# Patient Record
Sex: Male | Born: 1945 | Race: White | Hispanic: No | Marital: Married | Smoking: Former smoker
Health system: Southern US, Community
[De-identification: ages and names within clinical notes are randomized; demographics above are authoritative.]

## PROBLEM LIST (undated history)

## (undated) DIAGNOSIS — I10 Essential (primary) hypertension: Secondary | ICD-10-CM

## (undated) DIAGNOSIS — I4819 Other persistent atrial fibrillation: Secondary | ICD-10-CM

## (undated) DIAGNOSIS — Z85828 Personal history of other malignant neoplasm of skin: Secondary | ICD-10-CM

## (undated) DIAGNOSIS — Z5181 Encounter for therapeutic drug level monitoring: Secondary | ICD-10-CM

## (undated) DIAGNOSIS — I493 Ventricular premature depolarization: Secondary | ICD-10-CM

## (undated) DIAGNOSIS — N529 Male erectile dysfunction, unspecified: Secondary | ICD-10-CM

## (undated) DIAGNOSIS — M75100 Unspecified rotator cuff tear or rupture of unspecified shoulder, not specified as traumatic: Secondary | ICD-10-CM

## (undated) DIAGNOSIS — Z87898 Personal history of other specified conditions: Secondary | ICD-10-CM

## (undated) DIAGNOSIS — K219 Gastro-esophageal reflux disease without esophagitis: Secondary | ICD-10-CM

## (undated) DIAGNOSIS — Z79899 Other long term (current) drug therapy: Secondary | ICD-10-CM

## (undated) DIAGNOSIS — E785 Hyperlipidemia, unspecified: Secondary | ICD-10-CM

## (undated) DIAGNOSIS — K5790 Diverticulosis of intestine, part unspecified, without perforation or abscess without bleeding: Secondary | ICD-10-CM

## (undated) HISTORY — DX: Diverticulosis of intestine, part unspecified, without perforation or abscess without bleeding: K57.90

## (undated) HISTORY — PX: TONSILLECTOMY AND ADENOIDECTOMY: SUR1326

## (undated) HISTORY — DX: Personal history of other specified conditions: Z87.898

## (undated) HISTORY — PX: CYSTECTOMY: SUR359

## (undated) HISTORY — PX: INGUINAL HERNIA REPAIR: SUR1180

## (undated) HISTORY — DX: Hyperlipidemia, unspecified: E78.5

## (undated) HISTORY — DX: Essential (primary) hypertension: I10

## (undated) HISTORY — DX: Male erectile dysfunction, unspecified: N52.9

## (undated) HISTORY — DX: Gastro-esophageal reflux disease without esophagitis: K21.9

---

## 1992-11-21 DIAGNOSIS — Z87898 Personal history of other specified conditions: Secondary | ICD-10-CM

## 1992-11-21 HISTORY — DX: Personal history of other specified conditions: Z87.898

## 2004-06-29 ENCOUNTER — Ambulatory Visit (HOSPITAL_COMMUNITY): Admission: RE | Admit: 2004-06-29 | Discharge: 2004-06-29 | Payer: Self-pay | Admitting: Surgery

## 2004-11-21 DIAGNOSIS — K5790 Diverticulosis of intestine, part unspecified, without perforation or abscess without bleeding: Secondary | ICD-10-CM

## 2004-11-21 HISTORY — DX: Diverticulosis of intestine, part unspecified, without perforation or abscess without bleeding: K57.90

## 2004-11-21 HISTORY — PX: COLONOSCOPY: SHX174

## 2005-07-20 ENCOUNTER — Ambulatory Visit: Payer: Self-pay | Admitting: Internal Medicine

## 2005-07-20 ENCOUNTER — Encounter (INDEPENDENT_AMBULATORY_CARE_PROVIDER_SITE_OTHER): Payer: Self-pay | Admitting: *Deleted

## 2005-08-01 ENCOUNTER — Ambulatory Visit: Payer: Self-pay | Admitting: Gastroenterology

## 2005-08-15 ENCOUNTER — Ambulatory Visit: Payer: Self-pay | Admitting: Gastroenterology

## 2005-09-09 ENCOUNTER — Ambulatory Visit: Payer: Self-pay | Admitting: Internal Medicine

## 2005-10-03 ENCOUNTER — Ambulatory Visit: Payer: Self-pay | Admitting: Internal Medicine

## 2006-07-10 ENCOUNTER — Ambulatory Visit: Payer: Self-pay | Admitting: Internal Medicine

## 2006-07-11 ENCOUNTER — Encounter: Payer: Self-pay | Admitting: Internal Medicine

## 2006-08-04 ENCOUNTER — Ambulatory Visit: Payer: Self-pay | Admitting: Internal Medicine

## 2006-10-19 ENCOUNTER — Ambulatory Visit: Payer: Self-pay | Admitting: Internal Medicine

## 2006-10-27 ENCOUNTER — Ambulatory Visit: Payer: Self-pay | Admitting: Internal Medicine

## 2006-10-27 LAB — CONVERTED CEMR LAB
Chol/HDL Ratio, serum: 3.7
Cholesterol: 125 mg/dL (ref 0–200)

## 2007-07-05 ENCOUNTER — Telehealth (INDEPENDENT_AMBULATORY_CARE_PROVIDER_SITE_OTHER): Payer: Self-pay | Admitting: *Deleted

## 2007-07-06 ENCOUNTER — Encounter (INDEPENDENT_AMBULATORY_CARE_PROVIDER_SITE_OTHER): Payer: Self-pay | Admitting: *Deleted

## 2007-09-24 ENCOUNTER — Ambulatory Visit: Payer: Self-pay | Admitting: Internal Medicine

## 2007-09-27 DIAGNOSIS — R220 Localized swelling, mass and lump, head: Secondary | ICD-10-CM

## 2007-09-27 DIAGNOSIS — K219 Gastro-esophageal reflux disease without esophagitis: Secondary | ICD-10-CM

## 2007-10-02 ENCOUNTER — Encounter (INDEPENDENT_AMBULATORY_CARE_PROVIDER_SITE_OTHER): Payer: Self-pay | Admitting: *Deleted

## 2007-10-02 LAB — CONVERTED CEMR LAB
ALT: 25 units/L (ref 0–53)
BUN: 6 mg/dL (ref 6–23)
Creatinine, Ser: 1.1 mg/dL (ref 0.4–1.5)
Glucose, Bld: 95 mg/dL (ref 70–99)
HDL: 30.9 mg/dL — ABNORMAL LOW (ref >39.0)
LDL Cholesterol: 69 mg/dL (ref 0–99)
Total CHOL/HDL Ratio: 3.6
Triglycerides: 54 mg/dL (ref 0–149)

## 2007-10-04 ENCOUNTER — Encounter: Payer: Self-pay | Admitting: Internal Medicine

## 2007-10-04 ENCOUNTER — Ambulatory Visit: Payer: Self-pay | Admitting: Internal Medicine

## 2007-10-30 ENCOUNTER — Encounter (INDEPENDENT_AMBULATORY_CARE_PROVIDER_SITE_OTHER): Payer: Self-pay | Admitting: *Deleted

## 2007-10-31 ENCOUNTER — Ambulatory Visit: Payer: Self-pay | Admitting: Internal Medicine

## 2007-10-31 DIAGNOSIS — E782 Mixed hyperlipidemia: Secondary | ICD-10-CM

## 2007-10-31 DIAGNOSIS — N400 Enlarged prostate without lower urinary tract symptoms: Secondary | ICD-10-CM | POA: Insufficient documentation

## 2007-10-31 DIAGNOSIS — I10 Essential (primary) hypertension: Secondary | ICD-10-CM

## 2007-10-31 DIAGNOSIS — D692 Other nonthrombocytopenic purpura: Secondary | ICD-10-CM | POA: Insufficient documentation

## 2007-10-31 LAB — CONVERTED CEMR LAB: Cholesterol, target level: 200 mg/dL

## 2007-11-04 LAB — CONVERTED CEMR LAB
Basophils Relative: 0.3 % (ref 0.0–1.0)
Eosinophils Absolute: 0.1 10*3/uL (ref 0.0–0.6)
Eosinophils Relative: 1.6 % (ref 0.0–5.0)
Hemoglobin: 14.7 g/dL (ref 13.0–17.0)
Lymphocytes Relative: 26.7 % (ref 12.0–46.0)
MCHC: 34.9 g/dL (ref 30.0–36.0)
MCV: 84.7 fL (ref 78.0–100.0)
Neutro Abs: 5.4 10*3/uL (ref 1.4–7.7)
Neutrophils Relative %: 60.7 % (ref 43.0–77.0)
Platelets: 273 10*3/uL (ref 150–400)
RBC: 4.96 M/uL (ref 4.22–5.81)
TSH: 1.13 microintl units/mL (ref 0.35–5.50)
WBC: 8.9 10*3/uL (ref 4.5–10.5)

## 2007-11-05 ENCOUNTER — Encounter (INDEPENDENT_AMBULATORY_CARE_PROVIDER_SITE_OTHER): Payer: Self-pay | Admitting: *Deleted

## 2007-12-09 ENCOUNTER — Encounter: Payer: Self-pay | Admitting: Internal Medicine

## 2008-01-02 ENCOUNTER — Ambulatory Visit: Payer: Self-pay | Admitting: Internal Medicine

## 2008-01-02 DIAGNOSIS — IMO0002 Reserved for concepts with insufficient information to code with codable children: Secondary | ICD-10-CM | POA: Insufficient documentation

## 2008-01-09 ENCOUNTER — Encounter: Admission: RE | Admit: 2008-01-09 | Discharge: 2008-01-09 | Payer: Self-pay | Admitting: Geriatric Medicine

## 2008-01-14 ENCOUNTER — Telehealth: Payer: Self-pay | Admitting: Internal Medicine

## 2008-01-14 DIAGNOSIS — M4800 Spinal stenosis, site unspecified: Secondary | ICD-10-CM

## 2008-01-15 ENCOUNTER — Telehealth (INDEPENDENT_AMBULATORY_CARE_PROVIDER_SITE_OTHER): Payer: Self-pay | Admitting: *Deleted

## 2008-01-19 ENCOUNTER — Encounter: Payer: Self-pay | Admitting: Internal Medicine

## 2008-09-17 ENCOUNTER — Ambulatory Visit: Payer: Self-pay | Admitting: Internal Medicine

## 2008-10-06 ENCOUNTER — Telehealth (INDEPENDENT_AMBULATORY_CARE_PROVIDER_SITE_OTHER): Payer: Self-pay | Admitting: *Deleted

## 2008-12-11 ENCOUNTER — Ambulatory Visit: Payer: Self-pay | Admitting: Internal Medicine

## 2008-12-11 DIAGNOSIS — K5730 Diverticulosis of large intestine without perforation or abscess without bleeding: Secondary | ICD-10-CM | POA: Insufficient documentation

## 2008-12-11 DIAGNOSIS — R739 Hyperglycemia, unspecified: Secondary | ICD-10-CM

## 2008-12-11 DIAGNOSIS — I498 Other specified cardiac arrhythmias: Secondary | ICD-10-CM

## 2008-12-15 ENCOUNTER — Ambulatory Visit: Payer: Self-pay | Admitting: Internal Medicine

## 2008-12-15 ENCOUNTER — Encounter (INDEPENDENT_AMBULATORY_CARE_PROVIDER_SITE_OTHER): Payer: Self-pay | Admitting: *Deleted

## 2008-12-18 ENCOUNTER — Ambulatory Visit: Payer: Self-pay

## 2008-12-18 ENCOUNTER — Encounter: Payer: Self-pay | Admitting: Internal Medicine

## 2008-12-18 ENCOUNTER — Ambulatory Visit: Payer: Self-pay | Admitting: Internal Medicine

## 2009-01-08 ENCOUNTER — Ambulatory Visit: Payer: Self-pay | Admitting: Internal Medicine

## 2009-03-10 ENCOUNTER — Ambulatory Visit: Payer: Self-pay | Admitting: Internal Medicine

## 2009-03-17 ENCOUNTER — Encounter (INDEPENDENT_AMBULATORY_CARE_PROVIDER_SITE_OTHER): Payer: Self-pay | Admitting: *Deleted

## 2009-03-17 LAB — CONVERTED CEMR LAB
Cholesterol: 103 mg/dL (ref 0–200)
HDL: 35.8 mg/dL — ABNORMAL LOW (ref >39.00)
LDL Cholesterol: 54 mg/dL (ref 0–99)
VLDL: 13 mg/dL (ref 0.0–40.0)

## 2009-05-04 ENCOUNTER — Ambulatory Visit: Payer: Self-pay | Admitting: Internal Medicine

## 2009-09-30 ENCOUNTER — Ambulatory Visit: Payer: Self-pay | Admitting: Internal Medicine

## 2010-01-29 ENCOUNTER — Telehealth (INDEPENDENT_AMBULATORY_CARE_PROVIDER_SITE_OTHER): Payer: Self-pay | Admitting: *Deleted

## 2010-02-02 ENCOUNTER — Telehealth (INDEPENDENT_AMBULATORY_CARE_PROVIDER_SITE_OTHER): Payer: Self-pay | Admitting: *Deleted

## 2010-02-03 ENCOUNTER — Telehealth: Payer: Self-pay | Admitting: Internal Medicine

## 2010-02-04 ENCOUNTER — Encounter: Payer: Self-pay | Admitting: Internal Medicine

## 2010-03-24 ENCOUNTER — Ambulatory Visit: Payer: Self-pay | Admitting: Internal Medicine

## 2010-03-24 DIAGNOSIS — D485 Neoplasm of uncertain behavior of skin: Secondary | ICD-10-CM

## 2010-03-24 DIAGNOSIS — Z85828 Personal history of other malignant neoplasm of skin: Secondary | ICD-10-CM

## 2010-05-02 ENCOUNTER — Encounter: Payer: Self-pay | Admitting: Internal Medicine

## 2010-05-13 ENCOUNTER — Ambulatory Visit: Payer: Self-pay | Admitting: Internal Medicine

## 2010-05-13 DIAGNOSIS — I4949 Other premature depolarization: Secondary | ICD-10-CM

## 2010-05-14 ENCOUNTER — Encounter: Payer: Self-pay | Admitting: Internal Medicine

## 2010-06-09 ENCOUNTER — Ambulatory Visit (HOSPITAL_COMMUNITY): Admission: RE | Admit: 2010-06-09 | Discharge: 2010-06-09 | Payer: Self-pay | Admitting: Internal Medicine

## 2010-06-09 ENCOUNTER — Ambulatory Visit: Payer: Self-pay

## 2010-06-09 ENCOUNTER — Ambulatory Visit: Payer: Self-pay | Admitting: Cardiology

## 2010-06-09 ENCOUNTER — Encounter: Payer: Self-pay | Admitting: Internal Medicine

## 2010-06-21 ENCOUNTER — Telehealth: Payer: Self-pay | Admitting: Internal Medicine

## 2010-07-06 ENCOUNTER — Encounter: Payer: Self-pay | Admitting: Internal Medicine

## 2010-09-20 ENCOUNTER — Ambulatory Visit: Payer: Self-pay | Admitting: Internal Medicine

## 2010-11-21 HISTORY — PX: SPINE SURGERY: SHX786

## 2010-12-12 ENCOUNTER — Encounter: Payer: Self-pay | Admitting: Internal Medicine

## 2010-12-19 LAB — CONVERTED CEMR LAB
ALT: 23 units/L (ref 0–53)
AST: 25 units/L (ref 0–37)
AST: 31 units/L (ref 0–37)
Albumin: 3.6 g/dL (ref 3.5–5.2)
Albumin: 3.9 g/dL (ref 3.5–5.2)
Alkaline Phosphatase: 76 units/L (ref 39–117)
BUN: 6 mg/dL (ref 6–23)
BUN: 7 mg/dL (ref 6–23)
Basophils Absolute: 0 10*3/uL (ref 0.0–0.1)
Basophils Relative: 0.2 % (ref 0.0–3.0)
Basophils Relative: 0.4 % (ref 0.0–3.0)
Bilirubin, Direct: 0.1 mg/dL (ref 0.0–0.3)
Bilirubin, Direct: 0.1 mg/dL (ref 0.0–0.3)
CO2: 27 meq/L (ref 19–32)
Calcium: 8.8 mg/dL (ref 8.4–10.5)
Calcium: 8.9 mg/dL (ref 8.4–10.5)
Chloride: 103 meq/L (ref 96–112)
Chloride: 104 meq/L (ref 96–112)
Cholesterol, target level: 200 mg/dL
Cholesterol: 109 mg/dL (ref 0–200)
Cholesterol: 94 mg/dL (ref 0–200)
Creatinine, Ser: 1 mg/dL (ref 0.4–1.5)
Creatinine, Ser: 1.1 mg/dL (ref 0.4–1.5)
Eosinophils Absolute: 0.1 10*3/uL (ref 0.0–0.7)
Eosinophils Absolute: 0.2 10*3/uL (ref 0.0–0.7)
Eosinophils Relative: 0.9 % (ref 0.0–5.0)
GFR calc Af Amer: 97 mL/min
GFR calc non Af Amer: 80 mL/min
Glucose, Bld: 93 mg/dL (ref 70–99)
Glucose, Bld: 97 mg/dL (ref 70–99)
HCT: 41.5 % (ref 39.0–52.0)
HCT: 45.7 % (ref 39.0–52.0)
HDL goal, serum: 40 mg/dL
HDL: 31.9 mg/dL — ABNORMAL LOW (ref >39.0)
Hemoglobin: 14.1 g/dL (ref 13.0–17.0)
Hgb A1c MFr Bld: 6 % (ref 4.6–6.0)
LDL Cholesterol: 53 mg/dL (ref 0–99)
LDL Cholesterol: 69 mg/dL (ref 0–99)
LDL Goal: 100 mg/dL
Lymphocytes Relative: 22 % (ref 12.0–46.0)
MCHC: 34 g/dL (ref 30.0–36.0)
MCV: 85.4 fL (ref 78.0–100.0)
Monocytes Absolute: 0.7 10*3/uL (ref 0.1–1.0)
Monocytes Relative: 9.1 % (ref 3.0–12.0)
Neutro Abs: 5.6 10*3/uL (ref 1.4–7.7)
Neutro Abs: 5.9 10*3/uL (ref 1.4–7.7)
Neutrophils Relative %: 62.2 % (ref 43.0–77.0)
Neutrophils Relative %: 67.8 % (ref 43.0–77.0)
PSA: 0.56 ng/mL (ref 0.10–4.00)
PSA: 0.6 ng/mL (ref 0.10–4.00)
Platelets: 232 10*3/uL (ref 150–400)
Potassium: 4 meq/L (ref 3.5–5.1)
RBC: 4.86 M/uL (ref 4.22–5.81)
RDW: 13.6 % (ref 11.5–14.6)
RDW: 14.1 % (ref 11.5–14.6)
Sodium: 136 meq/L (ref 135–145)
Sodium: 139 meq/L (ref 135–145)
TSH: 0.91 microintl units/mL (ref 0.35–5.50)
Total Bilirubin: 0.6 mg/dL (ref 0.3–1.2)
Total Bilirubin: 1 mg/dL (ref 0.3–1.2)
Total CHOL/HDL Ratio: 2.9
Total Protein: 6.4 g/dL (ref 6.0–8.3)
Total Protein: 6.6 g/dL (ref 6.0–8.3)
Triglycerides: 45 mg/dL (ref 0–149)
Triglycerides: 58 mg/dL (ref 0.0–149.0)
VLDL: 9 mg/dL (ref 0–40)
WBC: 8.2 10*3/uL (ref 4.5–10.5)
WBC: 9.5 10*3/uL (ref 4.5–10.5)

## 2010-12-21 NOTE — Letter (Signed)
Summary: [facility]  [facility]   Imported By: Lanelle Bal 08/16/2010 09:20:24  _____________________________________________________________________  External Attachment:    Type:   Image     Comment:   External Document

## 2010-12-21 NOTE — Progress Notes (Signed)
Summary: refill  Phone Note Refill Request Message from:  Fax from Pharmacy on January 29, 2010 9:05 AM  Refills Requested: Medication #1:  QUINAPRIL HCL 40 MG  TABS 1 by mouth qd  Medication #2:  ADVICOR 1000-20 MG  TB24 (NIACIN-LOVASTATIN) 1/2 once daily [facility] fax [phone]   Method Requested: Mail to Pharmacy Next Appointment Scheduled: 03/24/2010 Initial call taken by: Barb Merino,  January 29, 2010 9:06 AM    Prescriptions: ADVICOR 1000-20 MG  TB24 (NIACIN-LOVASTATIN) 1/2 once daily  #45 x 0   Entered by:   Shonna Chock   Authorized by:   Marga Melnick MD   Signed by:   Shonna Chock on 01/29/2010   Method used:   Faxed to ...       [facility]* (mail-order)             ,          Ph: [phone]       Fax: [phone]   RxID:   [id number] QUINAPRIL HCL 40 MG  TABS (QUINAPRIL HCL) 1 by mouth qd  #90 x 0   Entered by:   Shonna Chock   Authorized by:   Marga Melnick MD   Signed by:   Shonna Chock on 01/29/2010   Method used:   Faxed to ...       [facility]* (mail-order)             ,          Ph: [phone]       Fax: [phone]   RxID:   [id number]

## 2010-12-21 NOTE — Progress Notes (Signed)
Summary: test results  Phone Note Call from Patient Call back at Home Phone [phone]   Caller: Spouse [phone]  Reason for Call: Talk to Nurse, Lab or Test Results Details for Reason: echo.have permission to leave message on voice mail. Initial call taken by: Lorne Skeens,  June 21, 2010 10:17 AM  Follow-up for Phone Call        lmom with results Dennis Bast, RN, BSN  June 21, 2010 1:14 PM

## 2010-12-21 NOTE — Miscellaneous (Signed)
Summary: Appointment Canceled  Appointment status changed to canceled by [employer] on 05/14/2010 11:45 AM.  Cancellation Comments --------------------- echo/401.09/bcbs prec. req/saf  Appointment Information ----------------------- Appt Type:  CARDIOLOGY ANCILLARY VISIT      Date:  Wednesday, June 02, 2010      Time:  11:30 AM for 60 min   Urgency:  Routine   Made By:  Pearson Grippe  To Visit:  [id number]    Reason:  echo/401.09/bcbs prec. req/saf  Appt Comments ------------- -- 05/14/10 11:45: ([facility]) CANCELED -- echo/401.09/bcbs prec. req/saf -- 05/13/10 11:12: ([facility]) BOOKED -- Routine CARDIOLOGY ANCILLARY VISIT at 06/02/2010 11:30 AM for 60 min echo/401.09/bcbs prec. req/saf

## 2010-12-21 NOTE — Consult Note (Signed)
Summary: [facility]   Imported By: Lanelle Bal 05/13/2010 12:26:55  _____________________________________________________________________  External Attachment:    Type:   Image     Comment:   External Document

## 2010-12-21 NOTE — Progress Notes (Signed)
Summary: med changed to ADVICOR 500-20 MG  Phone Note From Pharmacy Call back at [phone] opt 2   Caller: [facility] Summary of Call: reference #[id number] [facility] pharmacy call to verify rx sent on 01-29-10 for ADVICOR 1000-20 MG 1/2 once daily. Per pharmacy med can not be 1/2  according to manufacture due to med being extended release.  This med does come in ADVICOR 500-20 MG if physician does want to change strength. pls advise on med ...................Marland KitchenFelecia Deloach CMA  February 03, 2010 9:36 AM   Follow-up for Phone Call        per dr Donella Pascarella ok to change pt to ADVICOR 500-20 MG 1 tab once daily at bedtime, pharmacy notified, med list updated...............Marland KitchenFelecia Deloach CMA  February 03, 2010 10:12 AM     New/Updated Medications: ADVICOR 500-20 MG XR24H-TAB (NIACIN-LOVASTATIN) Take 1 tab once daily at bedtime Prescriptions: ADVICOR 500-20 MG XR24H-TAB (NIACIN-LOVASTATIN) Take 1 tab once daily at bedtime  #90 x 0   Entered by:   Jeremy Johann CMA   Authorized by:   Marga Melnick MD   Signed by:   Jeremy Johann CMA on 02/03/2010   Method used:   Telephoned to ...       [facility]* (mail-order)             ,          Ph: [phone]       Fax: [phone]   RxID:   [id number]

## 2010-12-21 NOTE — Assessment & Plan Note (Signed)
Summary: cpx/ns/kdc   Vital Signs:  Patient profile:   65 year old male Height:      70.75 inches Weight:      197.6 pounds BMI:     27.86 Temp:     98.3 degrees F oral Pulse rate:   64 / minute Resp:     14 per minute BP sitting:   100 / 68  (left arm) Cuff size:   large  Vitals Entered By: Shonna Chock (Mar 24, 2010 8:37 AM)  Comments REVIEWED MED LIST, PATIENT AGREED DOSE AND INSTRUCTION CORRECT    History of Present Illness: Sergio Mack is here for a physical ; he has  had an  active RTI for 2 weeks.  Allergies: 1)  ! * Shellfish  Past History:  Past Medical History: GERD Hyperlipidemia:LDL goal = < 100 based on NMR Lipoprofile Hypertension Hyperglycemia, PMH of  Diverticulosis, colon Skin cancer, hx of R ear   Past Surgical History: Inguinal herniorrhaphy X2 T & A Benign cyst chest removed from chest & lip Syncope 1994 negative  evaluation , ? heat related Colon---tics August 11, 2005  Family History: Father:died of   MI @ 85 Mother: DM,mini CVAs Siblings: sister DM,MI; MGF: stomach cancer  Social History: Retired Married Former Smoker: quit 06/06/? 1991 Alcohol use-no Regular exercise-no  Review of Systems General:  Denies chills, fatigue, fever, and sweats. Eyes:  Denies blurring, double vision, and vision loss-both eyes. ENT:  Complains of nasal congestion; denies ear discharge, earache, and sinus pressure; No frontal headache, facial pain or purulence. CV:  Denies chest pain or discomfort, difficulty breathing at night, difficulty breathing while lying down, leg cramps with exertion, palpitations, swelling of feet, and swelling of hands. Resp:  Complains of cough and sputum productive; denies chest pain with inspiration, coughing up blood, shortness of breath, and wheezing; White sputum. GI:  Denies abdominal pain, bloody stools, and dark tarry stools; Dietary related dyspepsia responsive to TUMS. GU:  Denies discharge, dysuria, and hematuria. MS:   Denies joint pain, joint redness, joint swelling, low back pain, mid back pain, and thoracic pain. Derm:  Complains of poor wound healing; denies changes in nail beds, dryness, hair loss, and lesion(s). Neuro:  Complains of numbness and tingling; Positional N&T @ night. Psych:  Denies anxiety and depression. Endo:  Denies cold intolerance, excessive hunger, excessive thirst, excessive urination, and heat intolerance. Heme:  Denies bleeding; Easy bruising with ASA. Allergy:  Complains of itching eyes and sneezing; Symptoms after mowing; no Rx.  Physical Exam  General:  in no acute distress; alert,appropriate and cooperative throughout examination Head:  Normocephalic and atraumatic without obvious abnormalities. Pattern alopecia Eyes:  No corneal or conjunctival inflammation noted. Perrla. Funduscopic exam benign, without hemorrhages, exudates or papilledema.  Ears:  External ear exam shows no significant lesions or deformities.  Otoscopic examination reveals clear canals, tympanic membranes are intact bilaterally without bulging, retraction, inflammation or discharge. Hearing aids  bilaterally. Nose:  External nasal examination shows no deformity or inflammation. Nasal mucosa are pink and moist without lesions or exudates. Mouth:  Oral mucosa and oropharynx without lesions or exudates.  Lower plate; upper partial Neck:  No deformities, masses, or tenderness noted. Lungs:  Normal respiratory effort, chest expands symmetrically. Lungs are clear to auscultation, no crackles or wheezes. Heart:  irregular rhythm; frequent prematures. Flow murmur.   Abdomen:  Bowel sounds positive,abdomen soft and non-tender without masses, organomegaly or hernias noted. Rectal:  No external abnormalities noted. Normal sphincter tone. No rectal  masses or tenderness. Genitalia:  Testes bilaterally descended without nodularity, tenderness or masses. No scrotal masses or lesions. No penis lesions or urethral discharge. L  varicocele.   Prostate:  Prostate gland firm and smooth, no enlargement, nodularity, tenderness, mass, asymmetry or induration. Msk:  No deformity or scoliosis noted of thoracic or lumbar spine.   Pulses:  R and L carotid,radial,dorsalis pedis and posterior tibial pulses are full and equal bilaterally Extremities:  No clubbing, cyanosis, edema, or deformity noted with normal full range of motion of all joints. Keratotic lesion R 5th toe  Neurologic:  alert & oriented X3 and DTRs symmetrical and normal.   Skin:  Intact without suspicious lesions or rashes(see toe) Cervical Nodes:  No lymphadenopathy noted Axillary Nodes:  No palpable lymphadenopathy Inguinal Nodes:  No significant adenopathy Psych:  memory intact for recent and remote, normally interactive, and good eye contact.     Impression & Recommendations:  Problem # 1:  ROUTINE GENERAL MEDICAL EXAM@HEALTH  CARE FACL (ICD-V70.0)  Orders: EKG w/ Interpretation (93000) Venipuncture (69485) TLB-Lipid Panel (80061-LIPID) TLB-BMP (Basic Metabolic Panel-BMET) (80048-METABOL) TLB-CBC Platelet - w/Differential (85025-CBCD) TLB-Hepatic/Liver Function Pnl (80076-HEPATIC) TLB-TSH (Thyroid Stimulating Hormone) (84443-TSH) TLB-PSA (Prostate Specific Antigen) (84153-PSA)  Problem # 2:  BRONCHITIS-ACUTE (ICD-466.0)  His updated medication list for this problem includes:    Doxycycline Hyclate 100 Mg Caps (Doxycycline hyclate) .Marland Kitchen... 1 two times a day  Problem # 3:  OTHER SPECIFIED CARDIAC DYSRHYTHMIAS (ICD-427.89)  PVCs  & ectopic atrial rhythm; S/P Cardiology evaluation 2010, Dr Allred His updated medication list for this problem includes:    Aspirin Ec 81 Mg Tbec (Aspirin)  Orders: EKG w/ Interpretation (93000) Venipuncture (46270) Cardiology Referral (Cardiology)  Problem # 4:  HYPERTENSION, ESSENTIAL NOS (ICD-401.9)  His updated medication list for this problem includes:    Quinapril Hcl 40 Mg Tabs (Quinapril hcl) .Marland Kitchen... 1 by  mouth qd  Orders: Venipuncture (35009)  Problem # 5:  HYPERLIPIDEMIA (ICD-272.2)  Orders: Venipuncture (38182) TLB-Lipid Panel (80061-LIPID)  Problem # 6:  NEOPLASM, SKIN, UNCERTAIN BEHAVIOR (ICD-238.2)  R 5th toe  Orders: Podiatry Referral (Podiatry)  Complete Medication List: 1)  Quinapril Hcl 40 Mg Tabs (Quinapril hcl) .Marland Kitchen.. 1 by mouth qd 2)  Advicor 1000-20 Mg Xr24h-tab (niacin-lovastatin)  .... Take 1/2  tab once daily at bedtime 3)  Cialis 20 Mg Tabs (Tadalafil) .... As needed (8 per month) 4)  Aspirin Ec 81 Mg Tbec (Aspirin) 5)  Fish Oil 1000 Mg Caps (Omega-3 fatty acids) .... 2 by mouth two times a day 6)  Lutein  .... Take as directed weekends only 7)  Occuvite  .... Take two a day week days only 8)  Centrum Tabs (Multiple vitamins-minerals) .... Take one a day 9)  Vitamin B-12 1000 Mcg Subl (Cyanocobalamin) .Marland Kitchen.. 1 by mouth once daily 10)  Vitamin D 2000 Unit Tabs (Cholecalciferol) .Marland Kitchen.. 1 by mouth once daily 11)  Iron 325 (65 Fe) Mg Tabs (Ferrous sulfate) .Marland Kitchen.. 1 by mouth once daily 12)  Doxycycline Hyclate 100 Mg Caps (Doxycycline hyclate) .Marland Kitchen.. 1 two times a day  Patient Instructions: 1)  Carry corrected record when traveling Prescriptions: ADVICOR 1000-20 MG XR24H-TAB (NIACIN-LOVASTATIN) Take 1/2  tab once daily at bedtime  #90 x 1   Entered and Authorized by:   Marga Melnick MD   Signed by:   Marga Melnick MD on 03/24/2010   Method used:   Print then Give to Patient   RxID:   [id number] DOXYCYCLINE HYCLATE 100 MG CAPS (DOXYCYCLINE HYCLATE)  1 two times a day  #20 x 0   Entered and Authorized by:   Marga Melnick MD   Signed by:   Marga Melnick MD on 03/24/2010   Method used:   Print then Give to Patient   RxID:   [id number] CIALIS 20 MG  TABS (TADALAFIL) as needed (8 per month)  #8 x 5   Entered and Authorized by:   Marga Melnick MD   Signed by:   Marga Melnick MD on 03/24/2010   Method used:   Print then Give to Patient   RxID:    [id number] QUINAPRIL HCL 40 MG  TABS (QUINAPRIL HCL) 1 by mouth qd  #90 x 3   Entered and Authorized by:   Marga Melnick MD   Signed by:   Marga Melnick MD on 03/24/2010   Method used:   Print then Give to Patient   RxID:   [id number]   Appended Document: cpx/ns/kdc  Laboratory Results   Urine Tests   Date/Time Reported: Mar 24, 2010 12:43 PM   Routine Urinalysis   Color: yellow Appearance: Clear Glucose: negative   (Normal Range: Negative) Bilirubin: negative   (Normal Range: Negative) Ketone: negative   (Normal Range: Negative) Spec. Gravity: <1.005   (Normal Range: 1.003-1.035) Blood: negative   (Normal Range: Negative) pH: 7.0   (Normal Range: 5.0-8.0) Protein: negative   (Normal Range: Negative) Urobilinogen: negative   (Normal Range: 0-1) Nitrite: negative   (Normal Range: Negative) Leukocyte Esterace: negative   (Normal Range: Negative)    Comments: Floydene Flock  Mar 24, 2010 12:43 PM

## 2010-12-21 NOTE — Progress Notes (Signed)
Summary: Refill Request  Phone Note Refill Request Message from:  Patient  Refills Requested: Medication #1:  QUINAPRIL HCL 40 MG  TABS 1 by mouth qd  Medication #2:  ADVICOR 1000-20 MG  TB24 (NIACIN-LOVASTATIN) 1/2 once daily  Method Requested: Fax to Local Pharmacy Initial call taken by: Shonna Chock,  February 02, 2010 10:29 AM    Prescriptions: ADVICOR 1000-20 MG  TB24 (NIACIN-LOVASTATIN) 1/2 once daily  #15 x 0   Entered by:   Shonna Chock   Authorized by:   Marga Melnick MD   Signed by:   Shonna Chock on 02/02/2010   Method used:   Faxed to ...       [facility] [address] [id number]* (retail)       [address] [place], [place]  [address]       Ph: [phone]       Fax: [phone]   RxID:   [id number] QUINAPRIL HCL 40 MG  TABS (QUINAPRIL HCL) 1 by mouth qd  #30 x 0   Entered by:   Shonna Chock   Authorized by:   Marga Melnick MD   Signed by:   Shonna Chock on 02/02/2010   Method used:   Electronically to        [id number]* (retail)       [address] [place], [place]  [address]       Ph: [phone]       Fax: [phone]   RxID:   [id number]

## 2010-12-21 NOTE — Assessment & Plan Note (Signed)
Summary: flu shot//lch  Nurse Visit   Allergies: 1)  ! * Shellfish  Orders Added: 1)  Admin 1st Vaccine [90471] 2)  Flu Vaccine 46yrs + [16109] Flu Vaccine Consent Questions     Do you have a history of severe allergic reactions to this vaccine? no    Any prior history of allergic reactions to egg and/or gelatin? no    Do you have a sensitivity to the preservative Thimersol? no    Do you have a past history of Guillan-Barre Syndrome? no    Do you currently have an acute febrile illness? no    Have you ever had a severe reaction to latex? no    Vaccine information given and explained to patient? yes    Are you currently pregnant? no    Lot Number:[id number]   Exp Date:05/21/2011   Site Given  Left Deltoid IM .lbflu

## 2010-12-21 NOTE — Medication Information (Signed)
Summary: Change in [facility]  Change in [facility]   Imported By: Lanelle Bal 02/23/2010 08:11:45  _____________________________________________________________________  External Attachment:    Type:   Image     Comment:   External Document

## 2010-12-21 NOTE — Assessment & Plan Note (Signed)
Summary: ROV/CARDIAC DYSRHYTHMIAS   Visit Type:  Follow-up Primary Provider:  Marga Melnick MD   History of Present Illness: The patient presents today for routine electrophysiology followup. He reports doing very well since last being seen in our clinic. The patient denies symptoms of palpitations, chest pain, shortness of breath, orthopnea, PND, lower extremity edema, dizziness, presyncope, syncope, or neurologic sequela. The patient is tolerating medications without difficulties and is otherwise without complaint today.   Current Medications (verified): 1)  Quinapril Hcl 40 Mg  Tabs (Quinapril Hcl) .Marland Kitchen.. 1 By Mouth Qd 2)  Advicor 1000-20 Mg Xr24h-Tab (Niacin-Lovastatin) .... Take 1/2  Tab Once Daily At Bedtime 3)  Cialis 20 Mg  Tabs (Tadalafil) .... As Needed (8 Per Month) 4)  Aspirin Ec 81 Mg  Tbec (Aspirin) .... Take One Tablet By Mouth Once Daily. 5)  Fish Oil 1000 Mg Caps (Omega-3 Fatty Acids) .... 2 By Mouth Two Times A Day 6)  Lutein .... Take As Directed Weekends Only 7)  Occuvite .... Take Two A Day Week Days Only 8)  Centrum   Tabs (Multiple Vitamins-Minerals) .... Take One A Day 9)  Vitamin B-12 1000 Mcg Subl (Cyanocobalamin) .Marland Kitchen.. 1 By Mouth Once Daily 10)  Vitamin D 2000 Unit Tabs (Cholecalciferol) .Marland Kitchen.. 1 By Mouth Once Daily 11)  Iron 325 (65 Fe) Mg Tabs (Ferrous Sulfate) .Marland Kitchen.. 1 By Mouth Once Daily 12)  Doxycycline Hyclate 100 Mg Caps (Doxycycline Hyclate) .Marland Kitchen.. 1 Two Times A Day  Allergies (verified): 1)  ! * Shellfish  Past History:  Past Medical History: GERD Hyperlipidemia:LDL goal = < 100 based on NMR Lipoprofile Hypertension Hyperglycemia, PMH of  Diverticulosis, colon Skin cancer, hx of R ear  PACs and PVCs  Past Surgical History: Reviewed history from 03/24/2010 and no changes required. Inguinal herniorrhaphy X2 T & A Benign cyst chest removed from chest & lip Syncope 1994 negative  evaluation , ? heat related Colon---tics 07/2005  Social  History: Reviewed history from 03/24/2010 and no changes required. Retired Married Former Smoker: quit 06/06/? 1991 Alcohol use-no Regular exercise-no  Review of Systems       All systems are reviewed and negative except as listed in the HPI.   Vital Signs:  Patient profile:   65 year old male Height:      70.75 inches Weight:      191 pounds BMI:     26.92 Pulse rate:   83 / minute BP sitting:   102 / 60  (left arm)  Vitals Entered By: Laurance Flatten CMA (May 13, 2010 10:25 AM)  Physical Exam  General:  Well developed, well nourished, in no acute distress. Head:  normocephalic and atraumatic Mouth:  Teeth, gums and palate normal. Oral mucosa normal. Neck:  Neck supple, no JVD. No masses, thyromegaly or abnormal cervical nodes. Lungs:  Clear bilaterally to auscultation and percussion. Heart:  RRR with ectopy, no m/r/g Abdomen:  Bowel sounds positive; abdomen soft and non-tender without masses, organomegaly, or hernias noted. No hepatosplenomegaly. Msk:  Back normal, normal gait. Muscle strength and tone normal. Pulses:  pulses normal in all 4 extremities Extremities:  No clubbing or cyanosis. Neurologic:  Alert and oriented x 3.   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  He does not have h/o ischemic symptoms or presyncope.  His prior echo and nuclear studies were normal. We will repeat his echo at this time. As he is asymptomatic, I would not recommend additional medical therapy at this time. He has occasional fatigue in the afternoons, I would therefore not start beta blocker therapy at this time.  Problem # 2:  HYPERTENSION, ESSENTIAL NOS (ICD-401.9) stable  Other Orders: Echocardiogram (Echo)  Patient Instructions: 1)  Your physician recommends that you schedule a  follow-up appointment in: 12 months with Dr Johney Frame 2)  Your physician has requested that you have an echocardiogram.  Echocardiography is a painless test that uses sound waves to create images of your heart. It provides your doctor with information about the size and shape of your heart and how well your heart's chambers and valves are working.  This procedure takes approximately one hour. There are no restrictions for this procedure.

## 2011-01-05 ENCOUNTER — Encounter: Payer: Self-pay | Admitting: Internal Medicine

## 2011-01-20 ENCOUNTER — Other Ambulatory Visit: Payer: Self-pay | Admitting: Orthopedic Surgery

## 2011-01-20 DIAGNOSIS — M79605 Pain in left leg: Secondary | ICD-10-CM

## 2011-01-20 DIAGNOSIS — M549 Dorsalgia, unspecified: Secondary | ICD-10-CM

## 2011-01-21 ENCOUNTER — Ambulatory Visit
Admission: RE | Admit: 2011-01-21 | Discharge: 2011-01-21 | Disposition: A | Discharge status: Home / Self Care | Payer: Medicare Other | Source: Ambulatory Visit | Attending: Orthopedic Surgery | Admitting: Orthopedic Surgery

## 2011-01-21 DIAGNOSIS — M549 Dorsalgia, unspecified: Secondary | ICD-10-CM

## 2011-01-21 DIAGNOSIS — M79605 Pain in left leg: Secondary | ICD-10-CM

## 2011-01-25 ENCOUNTER — Encounter: Payer: Self-pay | Admitting: Internal Medicine

## 2011-01-28 ENCOUNTER — Encounter: Payer: Self-pay | Admitting: Internal Medicine

## 2011-01-28 ENCOUNTER — Other Ambulatory Visit: Payer: Self-pay | Admitting: Internal Medicine

## 2011-01-28 ENCOUNTER — Ambulatory Visit (INDEPENDENT_AMBULATORY_CARE_PROVIDER_SITE_OTHER): Payer: Medicare Other | Admitting: Internal Medicine

## 2011-01-28 DIAGNOSIS — E782 Mixed hyperlipidemia: Secondary | ICD-10-CM

## 2011-01-28 DIAGNOSIS — K219 Gastro-esophageal reflux disease without esophagitis: Secondary | ICD-10-CM

## 2011-01-28 DIAGNOSIS — E785 Hyperlipidemia, unspecified: Secondary | ICD-10-CM

## 2011-01-28 DIAGNOSIS — R7309 Other abnormal glucose: Secondary | ICD-10-CM

## 2011-01-28 DIAGNOSIS — I498 Other specified cardiac arrhythmias: Secondary | ICD-10-CM

## 2011-01-28 DIAGNOSIS — I10 Essential (primary) hypertension: Secondary | ICD-10-CM

## 2011-01-28 DIAGNOSIS — M4800 Spinal stenosis, site unspecified: Secondary | ICD-10-CM

## 2011-01-28 DIAGNOSIS — Z01818 Encounter for other preprocedural examination: Secondary | ICD-10-CM

## 2011-01-28 LAB — TSH: TSH: 1.22 u[IU]/mL (ref 0.35–5.50)

## 2011-01-28 LAB — LIPID PANEL
Cholesterol: 123 mg/dL (ref 0–200)
HDL: 38.4 mg/dL — ABNORMAL LOW (ref >39.00)
LDL Cholesterol: 76 mg/dL (ref 0–99)
VLDL: 8.6 mg/dL (ref 0.0–40.0)

## 2011-01-28 LAB — HEPATIC FUNCTION PANEL
ALT: 26 U/L (ref 0–53)
AST: 24 U/L (ref 0–37)
Albumin: 3.9 g/dL (ref 3.5–5.2)
Alkaline Phosphatase: 68 U/L (ref 39–117)
Bilirubin, Direct: 0.1 mg/dL (ref 0.0–0.3)
Total Bilirubin: 0.4 mg/dL (ref 0.3–1.2)
Total Protein: 6.4 g/dL (ref 6.0–8.3)

## 2011-01-28 LAB — BASIC METABOLIC PANEL
CO2: 29 mEq/L (ref 19–32)
Chloride: 100 mEq/L (ref 96–112)
Creatinine, Ser: 1 mg/dL (ref 0.4–1.5)
Sodium: 136 mEq/L (ref 135–145)

## 2011-01-28 LAB — CBC WITH DIFFERENTIAL/PLATELET
Basophils Absolute: 0 10*3/uL (ref 0.0–0.1)
Basophils Relative: 0.4 % (ref 0.0–3.0)
Eosinophils Relative: 2 % (ref 0.0–5.0)
Lymphocytes Relative: 22.6 % (ref 12.0–46.0)
Lymphs Abs: 2.3 10*3/uL (ref 0.7–4.0)
Neutro Abs: 6.8 10*3/uL (ref 1.4–7.7)
Platelets: 256 10*3/uL (ref 150.0–400.0)
RBC: 5.08 Mil/uL (ref 4.22–5.81)
RDW: 13.7 % (ref 11.5–14.6)
WBC: 10.1 10*3/uL (ref 4.5–10.5)

## 2011-02-01 ENCOUNTER — Other Ambulatory Visit: Payer: Self-pay | Admitting: Orthopedic Surgery

## 2011-02-01 ENCOUNTER — Other Ambulatory Visit (HOSPITAL_COMMUNITY): Payer: Self-pay | Admitting: Orthopedic Surgery

## 2011-02-01 ENCOUNTER — Ambulatory Visit (HOSPITAL_COMMUNITY)
Admission: RE | Admit: 2011-02-01 | Discharge: 2011-02-01 | Disposition: A | Discharge status: Home / Self Care | Payer: Medicare Other | Source: Ambulatory Visit | Attending: Orthopedic Surgery | Admitting: Orthopedic Surgery

## 2011-02-01 ENCOUNTER — Encounter (HOSPITAL_COMMUNITY): Payer: Medicare Other

## 2011-02-01 DIAGNOSIS — Z01818 Encounter for other preprocedural examination: Secondary | ICD-10-CM

## 2011-02-01 LAB — ABO/RH: ABO/RH(D): O POS

## 2011-02-01 LAB — URINALYSIS, ROUTINE W REFLEX MICROSCOPIC
Glucose, UA: NEGATIVE mg/dL
Hgb urine dipstick: NEGATIVE
Ketones, ur: NEGATIVE mg/dL
Urobilinogen, UA: 0.2 mg/dL (ref 0.0–1.0)

## 2011-02-01 LAB — SURGICAL PCR SCREEN
MRSA, PCR: NEGATIVE
Staphylococcus aureus: NEGATIVE

## 2011-02-01 LAB — APTT: aPTT: 26 seconds (ref 24–37)

## 2011-02-01 LAB — TYPE AND SCREEN: ABO/RH(D): O POS

## 2011-02-01 NOTE — Assessment & Plan Note (Signed)
Summary: surgical clearace for back/cdj   Vital Signs:  Patient profile:   65 year old male Height:      71.5 inches Weight:      199.6 pounds BMI:     27.55 Temp:     98.2 degrees F oral Pulse rate:   64 / minute Resp:     14 per minute BP sitting:   118 / 76  (left arm) Cuff size:   large  Vitals Entered By: Shonna Chock CMA (January 28, 2011 8:05 AM) CC: Surgical clearance, Back pain, Pre-op Evaluation, Lipid Management   Primary Care Provider:  Marga Melnick MD  CC:  Surgical clearance, Back pain, Pre-op Evaluation, and Lipid Management.  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 He must use cane to ambulate. pain meds & oral steroids have been partially effective. He  reports weakness in L foot  and loss of sensation in LLE , but denies fever, chills, fecal incontinence, urinary incontinence, urinary retention, and dysuria.  The pain is located in the mid low back.  The pain is made worse by standing or walking and extension.  Risk factors for serious underlying conditions include duration of pain > 1 month and age >= 50 years.      Pre-Op Evaluation: The patient denies respiratory symptoms, GI bleeding, chest pain, edema, PND, heavy ETOH use, and smoking.  Patient has no history of acute or recent MI, unstable or severe angina, symptomatic ventricular arrhythmia, and severe valvular disease.  There is no history of antiplatelet agents, chronic steroids, warfarin, diabetes meds, antianginal meds, and bleeding disorder.    Lipid Management History:      Positive NCEP/ATP III risk factors include male age 21 years old or older, HDL cholesterol less than 40, family history for ischemic heart disease (females less than 18 years old), and hypertension.  Negative NCEP/ATP III risk factors include non-diabetic, non-tobacco-user status, no ASHD  (atherosclerotic heart disease), no prior stroke/TIA, no peripheral vascular disease, and no history of aortic aneurysm.     Current Medications (verified): 1)  Quinapril Hcl 40 Mg  Tabs (Quinapril Hcl) .Marland Kitchen.. 1 By Mouth Qd 2)  Cialis 20 Mg  Tabs (Tadalafil) .... As Needed (8 Per Month) 3)  Aspirin Ec 81 Mg  Tbec (Aspirin) .... Take One Tablet By Mouth Once Daily. 4)  Fish Oil 1000 Mg Caps (Omega-3 Fatty Acids) .... 2 By Mouth Two Times A Day 5)  Lutein .... Take As Directed Weekends Only 6)  Occuvite .... Take Two A Day Week Days Only 7)  Centrum   Tabs (Multiple Vitamins-Minerals) .... Take One A Day 8)  Vitamin B-12 1000 Mcg Subl (Cyanocobalamin) .Marland Kitchen.. 1 By Mouth Once Daily 9)  Vitamin D 2000 Unit Tabs (Cholecalciferol) .Marland Kitchen.. 1 By Mouth Once Daily 10)  Prednisone 5 Mg Tabs (Prednisone) .... As Directed 11)  Hydrocodone-Acetaminophen 5-325 Mg Tabs (Hydrocodone-Acetaminophen) .Marland Kitchen.. 1 By Mouth 2-3 X Daily As Needed  Allergies: 1)  ! * Shellfish  Past History:  Past Medical History: GERD Hyperlipidemia:LDL goal = < 100 based on NMR Lipoprofile. Framingham Study LDL goal = < 130. Hypertension Hyperglycemia, PMH of  Diverticulosis, colon Skin cancer, PMH  of R ear  PACs and PVCs  Past Surgical History: Inguinal herniorrhaphy X2 T & A Benign cyst chest removed from chest &  lip Syncope 1994 negative  evaluation , ? heat related Colonoscopy: Tics  31-Aug-2005  Family History: Father:died of   MI @ 43 Mother: DM, mini CVAs Siblings: sister: DM,MI @ 68; MGF: stomach cancer  Social History: Retired Married Former Smoker: quit  1991 Alcohol use-no Regular exercise-no  Review of Systems General:  Denies fatigue and weight loss. CV:  Denies palpitations. Resp:  Denies cough, coughing up blood, and sputum productive. GI:  Denies bloody stools, dark tarry stools, and indigestion. GU:  Denies discharge and hematuria. Derm:  Denies lesion(s). Heme:  Complains of abnormal bruising;  denies bleeding and enlarge lymph nodes.  Physical Exam  General:  well-nourished,in no acute distress; alert,appropriate and cooperative throughout examination Neck:  No deformities, masses, or tenderness noted. Lungs:  Normal respiratory effort, chest expands symmetrically. Lungs are clear to auscultation, no crackles or wheezes. Heart:  normal rate, regular rhythm, no gallop, no rub, no JVD, no HJR, and grade 1 /6 systolic murmur.  Occasional premature ( see EKG) Abdomen:  Bowel sounds positive,abdomen soft and non-tender without masses, organomegaly. Small umbilical hernia  noted. Msk:  No deformity or scoliosis noted of thoracic or lumbar spine.   Pulses:  R and L carotid,radial,dorsalis pedis and posterior tibial pulses are full and equal bilaterally Extremities:  No clubbing, cyanosis, edema, or deformity noted with normal full range of motion of all joints.   Neurologic:  alert & oriented X3, strength normal in all extremities, gait abnormal ( foot drop on L), and DTRs symmetrical and normal.   Skin:  Intact without suspicious lesions or rashes Psych:  memory intact for recent and remote, normally interactive, and good eye contact.     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 Take one tablet by mouth once daily.  Orders: EKG w/ Interpretation (93000) TLB-TSH (Thyroid Stimulating Hormone) (84443-TSH)  Problem # 4:  HYPERTENSION, ESSENTIAL NOS (ICD-401.9)  His updated medication list for this problem includes:    Quinapril Hcl 40 Mg Tabs (Quinapril hcl) .Marland Kitchen... 1 by mouth qd  Orders: EKG w/ Interpretation (93000) Venipuncture (09811) TLB-BMP (Basic Metabolic Panel-BMET) (80048-METABOL)  Problem # 5:   HYPERLIPIDEMIA (ICD-272.2)  off Advicor 1 month  Orders: Venipuncture (91478) TLB-Lipid Panel (80061-LIPID) TLB-Hepatic/Liver Function Pnl (80076-HEPATIC) TLB-TSH (Thyroid Stimulating Hormone) (84443-TSH)  Problem # 6:  GERD (ICD-530.81)  Orders: TLB-CBC Platelet - w/Differential (85025-CBCD)  Problem # 7:  FASTING HYPERGLYCEMIA (ICD-790.29)  Orders: TLB-A1C / Hgb A1C (Glycohemoglobin) (83036-A1C)  Complete Medication List: 1)  Quinapril Hcl 40 Mg Tabs (Quinapril hcl) .Marland Kitchen.. 1 by mouth qd 2)  Cialis 20 Mg Tabs (Tadalafil) .... As needed (8 per month) 3)  Aspirin Ec 81 Mg Tbec (Aspirin) .... Take one tablet by mouth once daily. 4)  Fish Oil 1000 Mg Caps (Omega-3 fatty acids) .... 2 by mouth two times a day 5)  Lutein  .... Take as directed weekends only 6)  Occuvite  .... Take two a day week days only 7)  Centrum Tabs (Multiple vitamins-minerals) .... Take one a day 8)  Vitamin B-12 1000 Mcg Subl (Cyanocobalamin) .Marland Kitchen.. 1 by mouth once daily 9)  Vitamin D 2000 Unit Tabs (Cholecalciferol) .Marland Kitchen.. 1 by mouth once daily 10)  Prednisone 5 Mg Tabs (Prednisone) .... As  directed 11)  Hydrocodone-acetaminophen 5-325 Mg Tabs (Hydrocodone-acetaminophen) .Marland Kitchen.. 1 by mouth 2-3 x daily as needed  Lipid Assessment/Plan:      Based on NCEP/ATP III, the patient's risk factor category is "2 or more risk factors and a calculated 10 year CAD risk of < 20%".  The patient's lipid goals are as follows: Total cholesterol goal is 200; LDL cholesterol goal is 100; HDL cholesterol goal is 40; Triglyceride goal is 150.  His LDL cholesterol goal has been met.    Patient Instructions: 1)  Avoid stimulants as discussed. Clearance depends on pending labs.   Orders Added: 1)  Est. Patient Level IV [16109] 2)  EKG w/ Interpretation [93000] 3)  Venipuncture [36415] 4)  TLB-Lipid Panel [80061-LIPID] 5)  TLB-BMP (Basic Metabolic Panel-BMET) [80048-METABOL] 6)  TLB-CBC Platelet - w/Differential [85025-CBCD] 7)   TLB-Hepatic/Liver Function Pnl [80076-HEPATIC] 8)  TLB-TSH (Thyroid Stimulating Hormone) [84443-TSH] 9)  TLB-A1C / Hgb A1C (Glycohemoglobin) [83036-A1C]

## 2011-02-01 NOTE — Letter (Signed)
Summary: [facility]   Imported By: Maryln Gottron 01/25/2011 15:20:50  _____________________________________________________________________  External Attachment:    Type:   Image     Comment:   External Document

## 2011-02-02 ENCOUNTER — Inpatient Hospital Stay (HOSPITAL_COMMUNITY): Payer: Medicare Other

## 2011-02-02 ENCOUNTER — Inpatient Hospital Stay (HOSPITAL_COMMUNITY)
Admission: RE | Admit: 2011-02-02 | Discharge: 2011-02-04 | DRG: 491 | Disposition: A | Discharge status: Home / Self Care | Payer: Medicare Other | Source: Ambulatory Visit | Attending: Orthopedic Surgery | Admitting: Orthopedic Surgery

## 2011-02-02 ENCOUNTER — Other Ambulatory Visit (HOSPITAL_COMMUNITY): Payer: Self-pay | Admitting: Orthopedic Surgery

## 2011-02-02 DIAGNOSIS — Z9889 Other specified postprocedural states: Secondary | ICD-10-CM

## 2011-02-02 DIAGNOSIS — Z79899 Other long term (current) drug therapy: Secondary | ICD-10-CM

## 2011-02-02 DIAGNOSIS — Z7982 Long term (current) use of aspirin: Secondary | ICD-10-CM

## 2011-02-02 DIAGNOSIS — M48061 Spinal stenosis, lumbar region without neurogenic claudication: Secondary | ICD-10-CM | POA: Diagnosis present

## 2011-02-02 DIAGNOSIS — M5126 Other intervertebral disc displacement, lumbar region: Principal | ICD-10-CM | POA: Diagnosis present

## 2011-02-02 DIAGNOSIS — I10 Essential (primary) hypertension: Secondary | ICD-10-CM | POA: Diagnosis present

## 2011-02-02 DIAGNOSIS — Z01818 Encounter for other preprocedural examination: Secondary | ICD-10-CM

## 2011-02-03 ENCOUNTER — Other Ambulatory Visit: Payer: Self-pay | Admitting: Orthopedic Surgery

## 2011-02-03 LAB — HEMOGLOBIN AND HEMATOCRIT, BLOOD
HCT: 42.2 % (ref 39.0–52.0)
Hemoglobin: 13.6 g/dL (ref 13.0–17.0)

## 2011-02-03 NOTE — H&P (Addendum)
[name], [name]               ACCOUNT NO.:  [id number]  MEDICAL RECORD NO.:  [id number]           PATIENT TYPE:  I  LOCATION:  0010                         FACILITY:  [facility]  PHYSICIAN:  Georges Lynch. Tashema Tiller, M.D.DATE OF BIRTH:  [birth year]  DATE OF ADMISSION:  02/02/2011 DATE OF DISCHARGE:                             HISTORY & PHYSICAL   CHIEF COMPLAINT:  Low back pain with radiculopathy down the right leg.  BRIEF HISTORY:  Sergio Mack came to see me in February with complaint of increasing pain in his low back that was radiating down the right leg. The patient states he had a similar pain in the past back in 2009 and that was radiating down his left leg.  He was treated at that time with a Dosepak and this improved his pain greatly.  After physical exam and discussion, we decided to be conservative and try that again.  We did, the prednisone pack did not give him relief unfortunately, and he needed to have myelogram.  The test was conducted.  It was found that the patient had multilevel spinal stenosis with nerve root encroachment at L2-L3, L3-L4, L4-L5, the most severe level was noted to be L4-L5 on the left where there was a subtotal block.  The patient now presents for lumbar surgery to be performed by Dr. Darrelyn Hillock.  CURRENT MEDICATIONS: 1. Cialis. 2. Quinapril 3. Advicor. 4. Fish oil. 5. Aspirin. 6. Vitamin D. 7. Vitamin B12. 8. Lutein. 9. Iron. 10.Multivitamin.  DRUG ALLERGIES:  He is allergic to SHELLFISH.  PAST SURGICAL HISTORY:  History of hernia repair and tonsillectomy.  PAST MEDICAL HISTORY:  Positive for hypertension, spinal stenosis.  FAMILY HISTORY:  Positive for heart disease and diabetes.  SOCIAL HISTORY:  The patient denies use of alcohol or tobacco products. Primary care physician is Dr. Marga Melnick.  The patient has been cleared for surgery by Dr. Alwyn Ren although recommendation has been given that the patient be placed on telemetry  postoperatively due to PACs noted on his EKG.  REVIEW OF SYSTEMS:  GENERAL:  Negative for fevers, chills or weight change.  HEENT:  Negative for blurred vision or dizziness. DERMATOLOGIC:  Negative for rash or lesion.  CARDIOVASCULAR:  Negative for chest pain.  He does again have history of PACs.  RESPIRATORY: Negative for shortness of breath.  GI: Negative for reflux disease.  GU: Negative for hematuria, dysuria.  MUSCULOSKELETAL:  Positive for back pain.  PHYSICAL EXAMINATION:  VITAL SIGNS:  Pulse irregular, respirations 18, blood pressure 124/82 in the left arm. GENERAL:  [name] is alert and oriented x3, well developed, well nourished, no apparent distress. HEENT:  Normocephalic, atraumatic.  Extraocular movements intact. NECK:  Supple.  Full range of motion without lymphadenopathy. CHEST:  Lungs are clear to auscultation bilaterally without wheezes. HEART:  PVCs are noted.  This is without murmur. ABDOMEN:  Bowel sounds present in all 4 quadrants. EXTREMITIES:  There is weakness of the dorsi and plantar flexors on theleft foot. SKIN:  Unremarkable. NEUROLOGIC:  He has positive straight leg raise on the left.  IMAGING:  Myelogram revealed severe spinal stenosis as noted  in the history.  IMPRESSION:  Severe spinal stenosis.  PLAN:  HNP, L4-L5 on the left, to be performed by Dr. Darrelyn Hillock.     Rozell Searing, PAC   ______________________________ Georges Lynch Darrelyn Hillock, M.D.    LD/MEDQ  D:  02/02/2011  T:  02/02/2011  Job:  [id number]  cc:   [name]. Alwyn Ren, MD,FACP,FCCP [phone] [place] [address]  Electronically Signed by Rozell Searing  on 02/03/2011 08:51:27 AM Electronically Signed by Ranee Gosselin M.D. on 02/04/2011 07:20:33 AM

## 2011-02-04 NOTE — Op Note (Signed)
Sergio Mack, Sergio Mack               ACCOUNT NO.:  [id number]  MEDICAL RECORD NO.:  [id number]           PATIENT TYPE:  I  LOCATION:  1534                         FACILITY:  [facility]  PHYSICIAN:  Sergio Mack. Sergio Mack, M.D.DATE OF BIRTH:  1946/03/12  DATE OF PROCEDURE: DATE OF DISCHARGE:                              OPERATIVE REPORT   PREOPERATIVE DIAGNOSES: 1. Severe spinal stenosis at L4-5. 2. Herniated lumbar disk at L4-5 on the left.  POSTOPERATIVE DIAGNOSES: 1. Severe spinal stenosis at L4-5. 2. Herniated lumbar disk at L4-5 on the left.  SURGEON:  Sergio Mack. Sergio Mack, M.D.  ASSISTANT:  Sergio Mack, M.D.  OPERATION: 1. Complete decompressive lumbar laminectomy at L4-5 for spinal     stenosis. 2. Microdiskectomy at L4-5 on the left. 3. Foraminotomy, L4-5, on the left.  DESCRIPTION OF PROCEDURE:  Under general anesthesia, routine orthopedic prep and draping of the lower back was carried out with the patient on the spinal frame.  Prior to surgery, I marked his back in the holding area on the left side even though we were on central but all his symptoms were on the left and highlighted this.  He had a partial foot drop on the left.  The appropriate time-out was carried out in the operating room prior to making incision.  After sterile prepping and draping was carried out, 2 needles were placed in the back for localization purposes.  An x-ray was taken to verify our position.  We then made an incision over the L4-5 space.  Bleeders identified and cauterized.  The muscle was stripped from the lamina and spinous processes.  At this time, we took another x-ray with clamps on the spinous processes, we localized the L4 spinous process.  At this time, I removed the spinous process with a double-action rongeur.  We then went down and did a complete lumbar laminectomy at L4-5, the microscope was brought in.  We protected the dura at all times with cottonoids.  Note, he had rather  significant stenosis.  Another x-ray was taken with the [facility], instrument in place.  Following that we then protected the dura and removed the ligamentum flavum at L4-5 and then did a decompression and wide on the lateral recess.  We then out wide on the left.  We had a nice decompression.  We identified lateral recess veins, cauterized lateral recess veins.  The dura was gently retracted.  We indented the plantar side of the L5 root on the left.  The needle was placed in the disk space and an x-ray was taken to verify our position. Following that we made a cruciate incision in the posterior longitudinal ligament.  We then utilized the nerve hook and the Epstein curettes to go proximally, distally, medially and laterally to make sure there were no other loose fragments noted.  Then went down in the disk space to complete diskectomy.  We then sent the specimen to the lab.  Note, when we felt that we completed diskectomy, we did a nice foraminotomy for the 5 root.  We were able to easily pass a hockey-stick out the 5 root foramen.  We went proximally as well.  We went central, proximally and distally to make sure we had a good decompression and we did.  We decompressed until we felt we had a wide opening area distally and proximally.  We had thoroughly irrigated out the area.  We loosely applied some thrombin-soaked Gelfoam into the lateral recess on the left.  We then closed the wound layers in usual fashion.  I left a small distal, small proximal deep parts of the wound open for draining purposes.  Subcu was closed with 0 Vicryl, skin with metal staples and a sterile Neosporin dressing was applied.  He had 1 g of IV Ancef preop. The patient left the operating room in satisfactory condition.          ______________________________ Sergio Mack Sergio Mack, M.D.     RAG/MEDQ  D:  02/02/2011  T:  02/03/2011  Job:  [id number]  cc:   Sergio Mack. Sergio Ren, MD,FACP,FCCP [phone] [place] [address]  Electronically Signed by Sergio Mack M.D. on 02/04/2011 07:20:35 AM

## 2011-02-08 NOTE — Letter (Signed)
Summary: [facility]   Imported By: Kassie Mends 02/04/2011 08:02:45  _____________________________________________________________________  External Attachment:    Type:   Image     Comment:   External Document

## 2011-02-28 NOTE — Discharge Summary (Signed)
Sergio Mack, Sergio Mack               ACCOUNT NO.:  [id number]  MEDICAL RECORD NO.:  [id number]           PATIENT TYPE:  I  LOCATION:  1534                         FACILITY:  [facility]  PHYSICIAN:  Rozell Searing, [facility]    DATE OF BIRTH:  Dec 11, 1945  DATE OF ADMISSION:  02/02/2011 DATE OF DISCHARGE:  02/04/2011                              DISCHARGE SUMMARY   ADMITTING DIAGNOSES: 1. Spinal stenosis and lumbar disk herniation. 2. Hypertension.  DISCHARGE DIAGNOSES: 1. Spinal stenosis and lumbar disk herniation status post complete     decompressive lumbar laminectomy at L4-L5 for spinal stenosis as     well as microdiskectomy and foraminotomy at L4-L5 on the left. 2. Hypertension.  LABORATORY DATA:  Hemoglobin and hematocrit were checked on February 03, 2011.  The patient's hemoglobin was 13.6 and hematocrit was 42.2.  PROCEDURE:  On February 02, 2011, the patient was taken to the operating room by surgeon Dr. Ranee Gosselin, assistant Dr. Marlowe Kays.  The patient underwent a complete decompressive lumbar laminectomy at L4-L5 for spinal stenosis and microdiskectomy and foraminotomy at L4-L5 on the left.  The procedure was performed under general anesthesia.  Routine orthopedic prepping and draping were carried out.  The patient received antibiotics preoperatively.  There were no complications of the procedure.  He was returned to the recovery room in satisfactory condition.  HOSPITAL COURSE:  The patient was admitted to [facility] on February 02, 2011, and underwent the above-stated procedure without complication.  After adequate time in the recovery room, he was taken to the floor for further recovery.  He was placed on reduced dose PCA Dilaudid as well as muscle relaxant for pain control.  Postoperative day #1 the patient was resting comfortably.  His left leg pain had improved greatly.  He had very slight weakness in the left foot dorsiflexor. Sensation grossly intact in the  lower extremities.  The patient was visited by Physical Therapy on postoperative day #1 and then again on postoperative day #2.  He was able to ambulate with mild pain.  He was able to work on transferring.  Postoperative day #2 the patient's dressing was changed.  The dressing was dry.  Wound well-approximated with no signs of infection and no drainage.  PLAN:  Discharge to home.  DISPOSITION:  To home on February 04, 2011.  MEDICATIONS ON DISCHARGE: 1. Fish oil. 2. Cialis. 3. Vitamin B12. 4. Vitamin D3. 5. Quinapril. 6. Multivitamin. 7. Ocuvite. 8. Lutein. 9. Hydrocodone/acetaminophen. 10.Aspirin. 11.Percocet. 12.Robaxin.  ACTIVITY:  The patient is to increase his activity slowly.  He is able to shower but be mindful not to submerge the wound in water.  DIET:  No restrictions.  WOUND CARE:  Daily dressing change.  FOLLOWUP:  The patient will follow up with Dr. Darrelyn Hillock in the office 2 weeks from the day of surgery.  He should contact the office at [phone] to schedule this appointment.  CONDITION ON DISCHARGE:  Improving.     Rozell Searing, [facility]     LD/MEDQ  D:  02/23/2011  T:  02/24/2011  Job:  [id number]  Electronically Signed by Lillia Abed  Annalucia Laino  on 02/28/2011 08:44:09 AM

## 2011-03-26 ENCOUNTER — Encounter: Payer: Self-pay | Admitting: Internal Medicine

## 2011-03-28 ENCOUNTER — Encounter: Payer: Self-pay | Admitting: Internal Medicine

## 2011-03-28 ENCOUNTER — Ambulatory Visit (INDEPENDENT_AMBULATORY_CARE_PROVIDER_SITE_OTHER): Payer: Medicare Other | Admitting: Internal Medicine

## 2011-03-28 VITALS — BP 118/70 | HR 76 | Temp 98.5°F | Resp 14 | Ht 71.0 in | Wt 200.0 lb

## 2011-03-28 DIAGNOSIS — K219 Gastro-esophageal reflux disease without esophagitis: Secondary | ICD-10-CM

## 2011-03-28 DIAGNOSIS — E782 Mixed hyperlipidemia: Secondary | ICD-10-CM

## 2011-03-28 DIAGNOSIS — Z0000 Encounter for general adult medical examination without abnormal findings: Secondary | ICD-10-CM

## 2011-03-28 DIAGNOSIS — Z85828 Personal history of other malignant neoplasm of skin: Secondary | ICD-10-CM

## 2011-03-28 DIAGNOSIS — N529 Male erectile dysfunction, unspecified: Secondary | ICD-10-CM

## 2011-03-28 DIAGNOSIS — I10 Essential (primary) hypertension: Secondary | ICD-10-CM

## 2011-03-28 DIAGNOSIS — Z23 Encounter for immunization: Secondary | ICD-10-CM

## 2011-03-28 MED ORDER — PNEUMOCOCCAL VAC POLYVALENT 25 MCG/0.5ML IJ INJ
0.5000 mL | INJECTION | Freq: Once | INTRAMUSCULAR | Status: AC
  Administered 2011-03-28: 0.5 mL via INTRAMUSCULAR

## 2011-03-28 MED ORDER — QUINAPRIL HCL 40 MG PO TABS: 40.0000 mg | ORAL_TABLET | Freq: Every day | ORAL | Status: DC

## 2011-03-28 NOTE — Patient Instructions (Addendum)
Preventive Health Care: Exercise at least 30-45 minutes a day,  3-4 days a week.  Eat a low-fat diet with lots of fruits and vegetables, up to 7-9 servings per day. Avoid obesity; your goal is waist measurement < 40 inches.Consume less than 40 grams of sugar per day from foods & drinks with High Fructose Corn Sugar as #2,3 or # 4 on label. Health Care Power of Attorney & Living Will. Complete if not in place ; these place you in charge of your health care decisions. Urology referral will be scheduled . Assess response to samples of Viagra 50 mg 1-2  Daily as needed.

## 2011-03-28 NOTE — Assessment & Plan Note (Addendum)
NMR Lipoprofile  2007: LDL 129(1931/1411), HDL 30, TG 108.LDL goal = < 100, ideally < 70.

## 2011-03-28 NOTE — Assessment & Plan Note (Signed)
Cancer removed from R ear in 1980s

## 2011-03-28 NOTE — Progress Notes (Signed)
Subjective:    Patient ID: Sergio Mack, male    DOB: 09/29/1946, 65 y.o.   MRN: [id number]  HPI Medicare Wellness Visit:  The following psychosocial & medical history were reviewed as required by Medicare.   Social history: caffeine: 1 gallon of tea daily , alcohol:  no ,  tobacco use : 1958  & exercise : walking 2-4X/ week.   Home & personal  safety / fall risk: "careful" since LS surgery 02/03/2011 , activities of daily living: no limitations except admonished to avoid  heavy lifting , seatbelt use : yes , and smoke alarm employment : yes .  Power of AttorneyLliving Will status : needed  Vision ( as recorded per Nurse) & Hearing  evaluation :  Hearing aids from Dr Sergio Mack. Orientation :oriented X3 , memory & recall :normal , spelling or math testing: good,and mood & affect : normal . Depression / anxiety: no issues Travel history : [place] 2011 , immunization status :Pneumovax given , transfusion history:  no, and preventive health surveillance ( colonoscopies, BMD , etc as per protocol/ SOC):  Up to date, Dental care:  Seen every 6 months . Chart reviewed &  Updated. Active issues reviewed & addressed.       Review of Systems  He  deniessyncope, focal weakness, memory loss, or  Tingling. Intermittent numbness LLE in variable locations. No new  skin/hair/nail changes. He has mild seasonal allergies when mowing. Suboptimal response to Cialis 20 mg; referral to specialist requested. He denies decreased libido or muscle weakness.     Objective:   Physical Exam Gen.: Healthy and well-nourished in appearance. Alert, appropriate and cooperative throughout exam. Ears: External  ear exam reveals no significant lesions or deformities.  Hearing  Aids worn bilaterally. Nose: External nasal exam reveals no deformity or inflammation. Nasal mucosa are pink and moist. No lesions or exudates noted. Septum   Normal.  Mouth: Oral mucosa and oropharynx reveal no lesions or exudates. Upper & lower  partials. Lungs: Normal respiratory effort; chest expands symmetrically. Lungs are clear to auscultation without rales, wheezes, or increased work of breathing. Heart: Normal rate and rhythm. Normal S1 and S2. No gallop, click, or rub. S4 with slurring;no murmur. Occasional  premature. Genitalia:  Normal DRE & genitalia.                                                                                     Musculoskeletal/extremities: No deformity or scoliosis noted of  the thoracic or lumbar spine.Op scar well healed. No clubbing, cyanosis, edema, or deformity noted. Range of motion  normal .Tone & strength  normal.Joints normal. Nail health  Good. Neg SLR Vascular: Carotid, radial artery, dorsalis pedis and dorsalis posterior tibial pulses are full and equal. No bruits present. Neurologic: Alert and oriented x3. Deep tendon reflexes symmetrical and normal.         Skin: Intact without suspicious lesions or rashes. Solar changes. Lymph: No cervical, axillary, or inguinal lymphadenopathy present. Psych: Mood and affect are normal. Normally interactive  Assessment & Plan:  #1 Medicare wellness visit; criteria met and dad and her  #2 erectile dysfunction; suboptimal response to Cialis 20 mg  Plan: Urologic referral to assess all options for ED.Samples of Viagra 50 mg 1-2 daily as needed provided as trial

## 2011-04-05 NOTE — Letter (Signed)
January 08, 2009    [name]. Alwyn Ren, MD  [phone] [place], [place] [address]   RE:  Sergio, Mack  MRN:  [id number]  /  DOB:  01/09/1946   Dear Dr. Alwyn Ren,   It was my pleasure to see Sergio Mack in electrophysiology followup  today after his recent consultation visit for frequent ectopy.  As you  recall, he is a pleasant 65 year old gentleman with a history of  hypertension and hyperlipidemia.  He is noted to have frequent premature  atrial contractions.  His primary concern clinically was symptoms of  fatigue.  He denies chest pain, palpitations, shortness of breath,  symptoms of heart failure, or ischemia.  He denies presyncope or  syncope.  Given his frequent PACs, a Holter monitor was performed on  December 18, 2008 which revealed 12,467 supraventricular beats with  several episodes of nonsustained atrial tachycardia with the longest  episode being only 9 beats.  Interestingly, the patient is completely  asymptomatic with these.  I have reviewed the Holter and did not find  any episodes of atrial fibrillation.  The patient has occasional  premature ventricular contractions.  His heart rate as well controlled  with an average heart rate of 80 beats per minute.  His minimum heart  rate was 62 and his maximum heart rate was 117 beats per minute.  He had  a transthoracic echocardiogram which revealed no evidence of structural  heart disease and a preserved ejection fraction.  We will also obtain a  GXT Myoview given the patient's risk factors for ischemia and this was  completely normal.  The patient was instructed to decrease his Toprol-XL  to 25 mg daily and notes some improvement with his fatigue with this.  He has followed his blood pressure and has not had any significant blood  pressure elevation at home.  He is otherwise doing well today.   PROBLEM LIST:  1. Premature atrial contractions and nonsustained atrial tachycardia.  2. Hypertension.  3.  Hyperlipidemia.  4. Erectile dysfunction.  5. Gastroesophageal reflux disease.   CURRENT MEDICATIONS:  1. Toprol-XL 25 mg daily.  2. Quinapril 40 mg daily.  3. Advair b.i.d.  4. Aspirin 81 mg daily.  5. Fish oil 1000 mg b.i.d.  6. Multivitamin.  7. Vitamin B12 1000 mg daily.  8. Vitamin D daily.  9. Cialis p.r.n.   PHYSICAL EXAMINATION:  VITALS:  Blood pressure 108/70, heart rate 84,  respirations 18, weight 197 pounds.  GENERAL:  The patient is a well-appearing male in no acute distress.  He  is alert and oriented x3.  HEENT:  Normocephalic, atraumatic.  Sclerae clear.  Conjunctivae pink.  Oropharynx clear.  NECK:  Supple.  No thyromegaly, JVD, or bruits.  LUNGS:  Clear to auscultation bilaterally.  HEART:  Regular rate and rhythm.  No murmurs, rubs, or gallops.  GI:  Soft, nontender, nondistended.  Positive bowel sounds.  EXTREMITIES:  No clubbing, cyanosis, or edema.  NEUROLOGIC:  Strength and sensation are intact.  SKIN:  No ecchymosis or lacerations.  MUSCULOSKELETAL:  No deformity or atrophy.  Cyclothymic mood full  affect.   IMPRESSION:  [name] is a pleasant 65 year old gentleman with  symptoms of fatigue who presents for further evaluation and management  of premature atrial contractions and nonsustained atrial tachycardia.  His recent event monitor revealed that his heart rate is well  controlled.  He has no evidence of ischemia by Myoview and a  structurally normal heart by echocardiogram.  I would recommend that we  discontinue Toprol-XL at this time as this appears to be causing  symptoms of fatigue.  If the patient began having palpitations or  increasing symptoms with his premature atrial contractions then I would  recommend that we try diltiazem as an alternative medication.  His blood  pressure is well-controlled today.  He was recently evaluated by you and  initiated on Advicor for his hyperlipidemia.  I therefore will make no  further adjustments at  this time and we will ask the patient to return  to my clinic as needed.   PLAN:  1. Toprol-XL was discontinued.  2. No other medication changes.  3. The patient will follow up with Dr. Alwyn Ren as previously scheduled      and with my office as needed.   Thank you for the opportunity of participating in the care of Sergio Mack.  I have returned his care and its entirety back to you.  Please  feel free to contact me if I can be of further assistance.    Sincerely,      Sergio Range, MD  Electronically Signed    JA/MedQ  DD: 01/08/2009  DT: 01/09/2009  Job #: [id number]

## 2011-04-05 NOTE — Letter (Signed)
December 15, 2008    [name]. Alwyn Ren, MD,FACP,FCCP  [phone] [place], [place] [address]   RE:  Sergio Mack, Sergio Mack  MRN:  [id number]  /  DOB:  1945-12-28   Dear Dr. Alwyn Ren,   It was my pleasure to see your patient Sergio Mack in  electrophysiology consultation today regarding therapeutic strategies  for frequent ectopy.  As you recall, Sergio Mack is a very pleasant 65-  year-old gentleman with a history of hypertension and hyperlipidemia.  He presented to your office for routine evaluation on December 11, 2008,  and was documented to have frequent premature atrial contractions in a  trigeminal pattern.  The patient reports symptoms of fatigue and  weakness.  He tells me that the symptoms have been present since 2002,  and attributes them to initiation of Toprol and quinapril.  He reports  decreased interest and motivation and finds himself sleeping frequently  in the afternoon from his recliner.  He denies shortness of breath with  usual activities.  He denies chest pain, orthopnea, PND, palpitations,  presyncope, or syncope.  He has had no symptoms of lower extremity edema  and is otherwise without complaint today.  He did have a syncopal  episode in 1994 for which he had an evaluation which was benign.  He has  had no further syncope since that time.   PAST MEDICAL HISTORY:  1. Bilateral hearing deficit.  2. Erectile dysfunction.  3. Hypertension.  4. Hyperlipidemia.  5. GERD.  6. Poor dentition  7. Degenerative disk disease.  8. History of benign cyst removed from his chest.  9. Status post hernia repair.   ALLERGIES:  SHELLFISH causes itching and chest tightness.   CURRENT MEDICATIONS:  1. Quinapril 40 mg daily.  2. Toprol-XL 50 mg daily.  3. Advair daily.  4. Aspirin 81 mg daily.  5. Fish oil 1000 mg b.i.d.  6. Vitamin D.  7. Multivitamin daily.   SOCIAL HISTORY:  The patient lives in [place], [place].  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  He has a 40-pack-year history of tobacco, but quit in 1995.  He has a remote history of heavy alcohol consumption, but quit in 1982.  He denies drug use.   FAMILY HISTORY:  The patient's father died of an MI at age 68.  His  sister died suddenly presumed of an MI at age 43.  The patient's mother  had multiple mini strokes and diabetes.   REVIEW OF SYSTEMS:  All systems are reviewed and negative except as  outlined in the HPI above.   PHYSICAL EXAMINATION:  VITALS:  Blood pressure 90/70, heart rate 63,  respirations 18, and weight 201 pounds.  GENERAL:  The patient is a well-appearing male in no acute distress.  Alert and oriented x3.  HEENT:  Normocephalic, atraumatic.  Sclerae are clear.  Conjunctivae are  pink.  Oropharynx is clear.  NECK:  Supple.  No thyromegaly, JVD, or bruits.  LUNGS:  Clear to auscultation bilaterally.  HEART:  Regular rate and rhythm with frequent ectopy.  No murmurs, rubs,  or gallops.  GI:  Soft, nontender, and nondistended.  Positive bowel sounds.  EXTREMITIES:  No clubbing, cyanosis, or edema.  NEUROLOGIC:  Strength and sensation are intact.  SKIN:  No ecchymosis or lacerations.  MUSCULOSKELETAL:  No deformity or atrophy.  PSYCHIATRIC:  Euthymic mood.  Full affect.   EKG today reveals sinus rhythm at  86 beats per minute with frequent  premature atrial contractions occurring in a trigeminal pattern.  There  is a slight aberrant conduction of the PVC focus.  The P-wave appears to  be negative in lead V1, positive in the inferior leads, positive in lead  1, and negative and aVL.   IMPRESSION:  Sergio Mack is a very pleasant 65 year old gentleman with a  history of hypertension and hyperlipidemia, who now presents for further  evaluation of frequent ectopy.  Review of his EKG from December 11, 2008,  reveals a frequent premature atrial contractions occurring in a  trigeminal pattern with mild average conduction to  the ventricle.  The  patient appears to be quite asymptomatic from his premature atrial  contractions.  He also has no known history of atrial fibrillation.  Therapeutic strategies for frequent premature atrial contractions were  discussed in detail with the patient today including both medicine and  catheter-based therapies.  As he is currently asymptomatic, I do not  think that attempts to decrease the burden of his ectopy is necessary.  He is most concerned about fatigue, which has been present for several  years, and for which he attributes to Toprol.  I suspect that indeed  Toprol has caused symptoms of fatigue and decreased motivation.  We will  obtain an echocardiogram to evaluate for structural heart disease as  well as GXT Myoview to rule out ischemia, given his risk factors of age,  hypertension, hyperlipidemia, and family history.  If he is found to  have no evidence of structural heart disease or ischemia, then I think  that we should discontinue his beta-blocker.  I have taken the liberty  of decreasing his Toprol-XL to 25 mg today.  We will also place a 24-  hour Holter to evaluate for any episodic atrial fibrillation.  The  patient is aware that frequent premature atrial contractions though  within themselves are benign, often lead to atrial fibrillation which  might require anticoagulation down the road.  If he has, however, no  documented atrial fibrillation, then I would not proceed with Coumadin.   PLAN:  1. Toprol-XL was decreased to 25 mg daily.  2. A 24-hour Holter is ordered.  3. Transthoracic echocardiogram and GXT Myoview are ordered.  4. The patient will follow up with me in the clinic to discuss the      results of these in 3 weeks.  If he has no evidence of structural      heart disease or ischemia, then I think that we should discontinue      his Toprol-XL at that time.  If he become symptomatic with his      premature atrial contractions down the road, then  we could consider      an initiation of the calcium channel blocker such as diltiazem or      verapamil.   Thank you for the opportunity of participating in the care of Mr.  [name].  Please feel free to contact me, if I can be of further  assistance.    Sincerely,      Hillis Range, MD  Electronically Signed    JA/MedQ  DD: 12/15/2008  DT: 12/16/2008  Job #: [id number]

## 2011-04-08 NOTE — Op Note (Signed)
NAME:  Sergio Mack, Sergio Mack                         ACCOUNT NO.:  [id number]   MEDICAL RECORD NO.:  [id number]                   PATIENT TYPE:  AMB   LOCATION:  DAY                                  FACILITY:  [facility]   PHYSICIAN:  Sandria Bales. Ezzard Standing, M.D.               DATE OF BIRTH:  May 23, 1946   DATE OF PROCEDURE:  06/29/2004  DATE OF DISCHARGE:                                 OPERATIVE REPORT   PREOPERATIVE DIAGNOSIS:  Left inguinal hernia.   POSTOPERATIVE DIAGNOSIS:  Large indirect, sliding left inguinal hernia with  smaller direct inguinal hernia.   PROCEDURE:  Open left inguinal hernia repair with mesh repair.   SURGEON:  Dr. Ezzard Standing   FIRST ASSISTANT:  None.   ANESTHESIA:  General with about 30 mL of 0.25% Marcaine.   COMPLICATIONS:  None.   INDICATIONS FOR PROCEDURE:  [name] is a 65 year old white male, who has  a symptomatic left inguinal hernia.  He now comes for repair of this hernia.  The indications and potential complications explained to the patient.  The  potential complications include but not limited to bleeding, infection,  nerve injury, recurrence of the hernia.   DESCRIPTION OF OPERATION:  He is allergic to IODINE, so he was painted with  Hibiclens.  He then underwent a sterile drape and a left inguinal incision  after a general endotracheal anesthetic.   Sharp dissection carried down to the external oblique fascia which was  opened.  The cord structure was encircled with a Penrose drain.  Actually,  the patient's hernia was down in the scrotum and had to be reduced after his  anesthesia.  The course showed a lot of chronic scarring.  He had a fairly  large indirect hernia which retreated about 9-10 cm, and this had a sliding  component to it and some fat that was also stuck.  I opened the sac, took  the fat down, went down to the bowel, and then closed it with a 0 chromic  suture.  He had a very patulous internal ring consistent with his large,  broad,  sliding, indirect hernia.  He had smaller direct components to his  hernias.   After reducing the hernias, I then did the inguinal floor repair using  Marlex mesh, sewn medially to the pubic tubercle, inferiorly to the shelving  edge of the inguinal ligament, superiorly to the transversalis fascia.   A keyhole was cut for the internal ring, and this was wrapped around the  cords and then sewn and closed behind the cord structures.  I used a 0  Novofil suture in interrupted fashion in my suture.  I then irrigated the  wound and closed the external oblique fascia with interrupted 3-0 Vicryl  sutures.  I infiltrated both the subfascial spaces, the inguinal floor, and  the subcutaneous spaces with about 30 mL of 0.25% Marcaine with epinephrine.  After closing the external  oblique fascia leaving adequate opening for an  external ring, I then closed the subcutaneous fascia with 3-0 Vicryl suture,  the skin was a 5-0 Monocryl suture, painted with tincture of Benzoin and  Steri-Strips.   The patient tolerated the procedure well, was transported to the recovery  room in good condition.  Sponge and needle count were correct at the end of  the case.                                               Sandria Bales. Ezzard Standing, M.D.    DHN/MEDQ  D:  06/29/2004  T:  06/29/2004  Job:  [id number]   cc:   [name]. Alwyn Ren, M.D. [facility]

## 2011-05-04 ENCOUNTER — Ambulatory Visit (INDEPENDENT_AMBULATORY_CARE_PROVIDER_SITE_OTHER): Payer: Medicare Other | Admitting: *Deleted

## 2011-05-04 DIAGNOSIS — Z23 Encounter for immunization: Secondary | ICD-10-CM

## 2012-03-28 ENCOUNTER — Ambulatory Visit (INDEPENDENT_AMBULATORY_CARE_PROVIDER_SITE_OTHER): Payer: Medicare Other | Admitting: Internal Medicine

## 2012-03-28 ENCOUNTER — Encounter: Payer: Self-pay | Admitting: Internal Medicine

## 2012-03-28 VITALS — BP 122/80 | HR 79 | Temp 98.2°F | Resp 12 | Ht 71.03 in | Wt 197.0 lb

## 2012-03-28 DIAGNOSIS — I10 Essential (primary) hypertension: Secondary | ICD-10-CM | POA: Diagnosis not present

## 2012-03-28 DIAGNOSIS — E782 Mixed hyperlipidemia: Secondary | ICD-10-CM | POA: Diagnosis not present

## 2012-03-28 DIAGNOSIS — R7309 Other abnormal glucose: Secondary | ICD-10-CM | POA: Diagnosis not present

## 2012-03-28 DIAGNOSIS — Z0000 Encounter for general adult medical examination without abnormal findings: Secondary | ICD-10-CM | POA: Diagnosis not present

## 2012-03-28 DIAGNOSIS — N529 Male erectile dysfunction, unspecified: Secondary | ICD-10-CM

## 2012-03-28 DIAGNOSIS — Z85828 Personal history of other malignant neoplasm of skin: Secondary | ICD-10-CM

## 2012-03-28 DIAGNOSIS — K219 Gastro-esophageal reflux disease without esophagitis: Secondary | ICD-10-CM

## 2012-03-28 LAB — CBC WITH DIFFERENTIAL/PLATELET
Basophils Relative: 0.4 % (ref 0.0–3.0)
Eosinophils Relative: 2 % (ref 0.0–5.0)
HCT: 43.8 % (ref 39.0–52.0)
Hemoglobin: 14.9 g/dL (ref 13.0–17.0)
Lymphs Abs: 2.4 10*3/uL (ref 0.7–4.0)
MCV: 83.5 fl (ref 78.0–100.0)
Monocytes Absolute: 0.8 10*3/uL (ref 0.1–1.0)
Monocytes Relative: 7.9 % (ref 3.0–12.0)
RBC: 5.25 Mil/uL (ref 4.22–5.81)
WBC: 9.6 10*3/uL (ref 4.5–10.5)

## 2012-03-28 LAB — BASIC METABOLIC PANEL
Chloride: 100 mEq/L (ref 96–112)
Potassium: 4.9 mEq/L (ref 3.5–5.1)
Sodium: 135 mEq/L (ref 135–145)

## 2012-03-28 LAB — LIPID PANEL
Cholesterol: 128 mg/dL (ref 0–200)
LDL Cholesterol: 74 mg/dL (ref 0–99)

## 2012-03-28 LAB — TSH: TSH: 0.99 u[IU]/mL (ref 0.35–5.50)

## 2012-03-28 LAB — HEPATIC FUNCTION PANEL
ALT: 25 U/L (ref 0–53)
AST: 28 U/L (ref 0–37)
Albumin: 4.1 g/dL (ref 3.5–5.2)
Alkaline Phosphatase: 89 U/L (ref 39–117)

## 2012-03-28 MED ORDER — QUINAPRIL HCL 40 MG PO TABS: 40.0000 mg | ORAL_TABLET | Freq: Every day | ORAL | Status: DC

## 2012-03-28 NOTE — Progress Notes (Signed)
Subjective:    Patient ID: Sergio Mack, male    DOB: [birth year], 66 y.o.   MRN: [id number]  HPI Medicare Wellness Visit:  The following psychosocial & medical history were reviewed as required by Medicare.   Social history: caffeine: 1 gallon tea/ day , alcohol: no ,  tobacco use :quit 1991  & exercise : no regular program but walking on weekends.   Home & personal  safety / fall risk:no issues, activities of daily living: no limitations , seatbelt use : yes , and smoke alarm employment : yes .  Power of Attorney/Living Will status : NO (discussed )  Vision ( as recorded per Nurse) & Hearing  evaluation :  Ophth exam in 7/13. Hearing aids checked last week. Orientation :oriented X 3 , memory & recall :good,  math testing: good,and mood & affect : normal . Depression / anxiety/mood swings: admits to concern &  frustration about wife's health issues Travel history : [place] 2012 , immunization status :up to date , transfusion history:  no, and preventive health surveillance ( colonoscopies, BMD , etc as per protocol/ [facility]): colonoscopy 2016, Dental care:  Every 6 mos . Chart reviewed &  Updated. Active issues reviewed & addressed.       Review of Systems HYPERTENSION: Disease Monitoring: Blood pressure range-not monitored  Chest pain, palpitations- no      Dyspnea- no Medications: Compliance- yes Lightheadedness,Syncope-no   Edema- no  FASTING HYPERGLYCEMIA, PMH of: Polyuria/phagia/dipsia- no       Visual problems- no, but floater OD  HYPERLIPIDEMIA: Disease Monitoring: See symptoms for Hypertension Medications: Compliance- no med @ present  Abd pain, bowel changes-no   Muscle aches- no  He is seeing Dr. Brunilda Payor concerning erectile dysfunction. He previously took penile  injections w/o some benefit.Cialis also helps  Some. He describes weakness as well as decreased libido. Testosterone is not check        Objective:   Physical Exam Gen.: Healthy and well-nourished in  appearance. Alert, appropriate and cooperative throughout exam. Head: Normocephalic without obvious abnormalities  Eyes: No corneal or conjunctival inflammation noted. Pupils equal round reactive to light and accommodation. Ptosis bilaterally.Extraocular motion intact. Vision grossly normal with lenses. Ears: External  ear exam reveals no significant lesions or deformities. Canals clear .TMs normal. Hearing aids bilaterally. Nose: External nasal exam reveals no deformity or inflammation. Nasal mucosa are pink and moist. No lesions or exudates noted.  Mouth: Oral mucosa and oropharynx reveal no lesions or exudates. Lower plate with posts. Upper partial Neck: No deformities, masses, or tenderness noted. Range of motion slightly decreased.Thyroid normal Lungs: Normal respiratory effort; chest expands symmetrically. Lungs are clear to auscultation without rales, wheezes, or increased work of breathing. Heart: Normal rate and rhythm. Normal S1 and S2. No gallop, click, or rub. Grade 1/2 over 6 systolic murmur . Occasional premature beat Abdomen: Bowel sounds normal; abdomen soft and nontender. No masses, organomegaly . Umbilical  hernia noted. Genitalia/DRE: Dr Brunilda Payor                                                          Musculoskeletal/extremities: Slight lordosis noted of  the thoracic  spine. No clubbing, cyanosis, edema, or deformity noted. Range of motion  normal .Tone & strength  normal.Joints normal. Nail health  good. Vascular:  Carotid, radial artery, dorsalis pedis and  posterior tibial pulses are full and equal. No bruits present. Neurologic: Alert and oriented x3. Deep tendon reflexes symmetrical and normal.          Skin: Intact without suspicious lesions or rashes. Lymph: No cervical, axillary lymphadenopathy present. Psych: Mood and affect are normal. Normally interactive                                                                                          Assessment & Plan:    #1 Medicare Wellness Exam; criteria met ; data entered #2 Problem List reviewed ; Assessment/ Recommendations made Plan: see Orders

## 2012-03-28 NOTE — Patient Instructions (Addendum)
Preventive Health Care: Exercise at least 30-45 minutes a day,  3-4 days a week.  Eat a low-fat diet with lots of fruits and vegetables, up to 7-9 servings per day. Consume less than 40 grams of sugar per day from foods & drinks with High Fructose Corn Sugar as # 1,2,3 or # 4 on label. If you're having significant mood swings; please consider referral to a psychologist for anger management. To prevent palpitations or premature beats, avoid stimulants such as decongestants, diet pills, nicotine, or caffeine (coffee, tea, cola, or chocolate) to excess.  Health Care Power of Attorney & Living Will. Complete if not in place ; these place you in charge of your health care decisions. Depression is common in our stressful world.If you're feeling down or losing interest in things you normally enjoy, please call .  Blood Pressure Goal  Ideally is an AVERAGE < 135/85. This AVERAGE should be calculated from @ least 5-7 BP readings taken @ different times of day on different days of week. You should not respond to isolated BP readings , but rather the AVERAGE for that week . Please try to go on My Chart within the next 24 hours to allow me to release the results directly to you.

## 2012-04-07 ENCOUNTER — Encounter: Payer: Self-pay | Admitting: Internal Medicine

## 2012-04-11 DIAGNOSIS — N401 Enlarged prostate with lower urinary tract symptoms: Secondary | ICD-10-CM | POA: Diagnosis not present

## 2012-04-11 DIAGNOSIS — N529 Male erectile dysfunction, unspecified: Secondary | ICD-10-CM | POA: Diagnosis not present

## 2012-04-26 ENCOUNTER — Encounter: Payer: Self-pay | Admitting: Internal Medicine

## 2012-05-30 DIAGNOSIS — D235 Other benign neoplasm of skin of trunk: Secondary | ICD-10-CM | POA: Diagnosis not present

## 2012-05-30 DIAGNOSIS — L570 Actinic keratosis: Secondary | ICD-10-CM | POA: Diagnosis not present

## 2012-06-06 DIAGNOSIS — H2510 Age-related nuclear cataract, unspecified eye: Secondary | ICD-10-CM | POA: Diagnosis not present

## 2012-08-22 DIAGNOSIS — Z23 Encounter for immunization: Secondary | ICD-10-CM | POA: Diagnosis not present

## 2012-10-11 DIAGNOSIS — N401 Enlarged prostate with lower urinary tract symptoms: Secondary | ICD-10-CM | POA: Diagnosis not present

## 2012-10-11 DIAGNOSIS — N529 Male erectile dysfunction, unspecified: Secondary | ICD-10-CM | POA: Diagnosis not present

## 2012-11-28 DIAGNOSIS — L570 Actinic keratosis: Secondary | ICD-10-CM | POA: Diagnosis not present

## 2013-01-05 ENCOUNTER — Other Ambulatory Visit: Payer: Self-pay

## 2013-03-13 ENCOUNTER — Other Ambulatory Visit (HOSPITAL_COMMUNITY): Payer: Self-pay | Admitting: Nurse Practitioner

## 2013-03-13 DIAGNOSIS — K4090 Unilateral inguinal hernia, without obstruction or gangrene, not specified as recurrent: Secondary | ICD-10-CM

## 2013-03-14 ENCOUNTER — Ambulatory Visit (HOSPITAL_COMMUNITY): Payer: Medicare Other

## 2013-03-29 ENCOUNTER — Ambulatory Visit (INDEPENDENT_AMBULATORY_CARE_PROVIDER_SITE_OTHER): Payer: Medicare Other | Admitting: Internal Medicine

## 2013-03-29 ENCOUNTER — Encounter: Payer: Self-pay | Admitting: Internal Medicine

## 2013-03-29 VITALS — BP 126/84 | HR 84 | Temp 98.2°F | Resp 12 | Ht 71.0 in | Wt 201.0 lb

## 2013-03-29 DIAGNOSIS — I10 Essential (primary) hypertension: Secondary | ICD-10-CM | POA: Diagnosis not present

## 2013-03-29 DIAGNOSIS — E782 Mixed hyperlipidemia: Secondary | ICD-10-CM | POA: Diagnosis not present

## 2013-03-29 DIAGNOSIS — Z0000 Encounter for general adult medical examination without abnormal findings: Secondary | ICD-10-CM

## 2013-03-29 DIAGNOSIS — K5730 Diverticulosis of large intestine without perforation or abscess without bleeding: Secondary | ICD-10-CM | POA: Diagnosis not present

## 2013-03-29 LAB — LIPID PANEL
Cholesterol: 114 mg/dL (ref 0–200)
VLDL: 10.2 mg/dL (ref 0.0–40.0)

## 2013-03-29 LAB — HEPATIC FUNCTION PANEL
ALT: 24 U/L (ref 0–53)
AST: 25 U/L (ref 0–37)
Albumin: 3.9 g/dL (ref 3.5–5.2)
Alkaline Phosphatase: 73 U/L (ref 39–117)
Total Protein: 6.8 g/dL (ref 6.0–8.3)

## 2013-03-29 LAB — BASIC METABOLIC PANEL
Calcium: 8.6 mg/dL (ref 8.4–10.5)
GFR: 80.94 mL/min (ref >60.00)
Glucose, Bld: 102 mg/dL — ABNORMAL HIGH (ref 70–99)
Potassium: 4.5 mEq/L (ref 3.5–5.1)
Sodium: 131 mEq/L — ABNORMAL LOW (ref 135–145)

## 2013-03-29 LAB — CBC WITH DIFFERENTIAL/PLATELET
Basophils Absolute: 0.1 10*3/uL (ref 0.0–0.1)
Basophils Relative: 0.5 % (ref 0.0–3.0)
Eosinophils Relative: 1.2 % (ref 0.0–5.0)
HCT: 42.1 % (ref 39.0–52.0)
Hemoglobin: 14.4 g/dL (ref 13.0–17.0)
Lymphocytes Relative: 17.5 % (ref 12.0–46.0)
Lymphs Abs: 1.6 10*3/uL (ref 0.7–4.0)
Monocytes Relative: 7 % (ref 3.0–12.0)
Neutro Abs: 7 10*3/uL (ref 1.4–7.7)
RBC: 5.06 Mil/uL (ref 4.22–5.81)
RDW: 14.2 % (ref 11.5–14.6)

## 2013-03-29 LAB — TSH: TSH: 0.47 u[IU]/mL (ref 0.35–5.50)

## 2013-03-29 MED ORDER — QUINAPRIL HCL 40 MG PO TABS: 40.0000 mg | ORAL_TABLET | Freq: Every day | ORAL | Status: DC

## 2013-03-29 NOTE — Progress Notes (Signed)
Subjective:    Patient ID: Sergio Mack, male    DOB: Sep 18, 1946, 67 y.o.   MRN: [id number]  HPI Medicare Wellness Visit:  Psychosocial & medical history were reviewed as required by Medicare (abuse,antisocial behavioral risks,firearm risk).  Social history: caffeine: 1 gallon tea/ day  , alcohol: no ,  tobacco use: quit 1991   Exercise :  Gardening & walking 3 X/ week for up to 5 hrs No home & personal  safety / fall risk Activities of daily living: no limitations  Seatbelt, [place]  and smoke alarm employed. Power of Attorney/Living Will status :needed Ophthalmology exam current Hearing evaluation  current Orientation :oriented X 3  Memory & recall :good Spelling or math testing:good Mood & affect : normal . Depression / anxiety: denied Travel history : last 6/13 [place]  Immunization status : Shingles /Flu/ PNA/ tetanus current Transfusion history:  none  Preventive health surveillance ( colonoscopy as per protocol/ [facility]): current  Dental care:  Every 6 mos. Chart reviewed &  Updated. Active issues reviewed & addressed.      Review of Systems He is on a modified heart healthy diet; he exercises as noted without symptoms. Specifically he denies chest pain, palpitations (see EKG), dyspnea, or claudication. Family history is positive for premature coronary disease. Advanced cholesterol testing reveals his LDL goal is less than 100, ideally < 70.He has been off a statin for several years.    Objective:   Physical Exam  Gen.: Healthy and well-nourished in appearance. Alert, appropriate and cooperative throughout exam. Appears younger than stated age  Head: Normocephalic without obvious abnormalities;  pattern alopecia  Eyes: No corneal or conjunctival inflammation noted. Ptosis ,OD > OS. Extraocular motion intact. Vision grossly normal with lenses Ears: External  ear exam reveals no significant lesions or deformities. Canals clear .TMs normal. Hearing is grossly normal  bilaterally. Nose: External nasal exam reveals no deformity or inflammation. Nasal mucosa are pink and moist. No lesions or exudates noted. Septum deviated to R Mouth: Oral mucosa and oropharynx reveal no lesions or exudates. Teeth in good repair; lower plate & upper partial. Neck: No deformities, masses, or tenderness noted. Range of motion & Thyroid normal. Lungs: Normal respiratory effort; chest expands symmetrically. Lungs are clear to auscultation without rales, wheezes, or increased work of breathing. Heart: Normal rate and rhythm. Normal S1 and S2. No gallop, click, or rub. S4 w/o murmur. Abdomen: Bowel sounds normal; abdomen soft and nontender. No masses or organomegaly . Umbilical  hernia noted. Genitalia: As per Dr Brunilda Payor                                Musculoskeletal/extremities: There is some asymmetry of the posterior thoracic musculature suggesting occult scoliosis. No clubbing, cyanosis, edema, or significant extremity  deformity noted. Range of motion normal .Tone & strength  Normal. Joints normal . Nail health good. Able to lie down & sit up w/o help. Negative SLR bilaterally Vascular: Carotid, radial artery, dorsalis pedis and  posterior tibial pulses are full and equal. Very faint R carotid bruit present. Neurologic: Alert and oriented x3. Deep tendon reflexes symmetrical and normal.     Skin: Intact without suspicious lesions or rashes. Lymph: No cervical, axillary lymphadenopathy present. Psych: Mood and affect are normal. Normally interactive  Assessment & Plan:  #1 Medicare Wellness Exam; criteria met ; data entered #2 Problem List reviewed ; Assessment/ Recommendations made Plan: see Orders

## 2013-03-29 NOTE — Patient Instructions (Addendum)
To prevent palpitations or premature beats, avoid stimulants such as decongestants, diet pills, nicotine, or caffeine (coffee, tea, cola, or chocolate) to excess. Minimal Blood Pressure Goal= AVERAGE < 140/90;  Ideal is an AVERAGE < 135/85. This AVERAGE should be calculated from @ least 5-7 BP readings taken @ different times of day on different days of week. You should not respond to isolated BP readings , but rather the AVERAGE for that week .Please bring your  blood pressure cuff to office visits to verify that it is reliable.It  can also be checked against the blood pressure device at the pharmacy. Finger or wrist cuffs are not dependable; an arm cuff is.

## 2013-04-01 ENCOUNTER — Ambulatory Visit: Payer: Medicare Other

## 2013-04-01 DIAGNOSIS — R7309 Other abnormal glucose: Secondary | ICD-10-CM

## 2013-04-01 LAB — HEMOGLOBIN A1C: Hgb A1c MFr Bld: 6.1 % (ref 4.6–6.5)

## 2013-04-10 DIAGNOSIS — H905 Unspecified sensorineural hearing loss: Secondary | ICD-10-CM | POA: Diagnosis not present

## 2013-04-11 DIAGNOSIS — N401 Enlarged prostate with lower urinary tract symptoms: Secondary | ICD-10-CM | POA: Diagnosis not present

## 2013-04-11 DIAGNOSIS — N529 Male erectile dysfunction, unspecified: Secondary | ICD-10-CM | POA: Diagnosis not present

## 2013-05-16 ENCOUNTER — Other Ambulatory Visit (INDEPENDENT_AMBULATORY_CARE_PROVIDER_SITE_OTHER): Payer: Medicare Other

## 2013-05-16 DIAGNOSIS — E871 Hypo-osmolality and hyponatremia: Secondary | ICD-10-CM | POA: Diagnosis not present

## 2013-05-16 LAB — BASIC METABOLIC PANEL
CO2: 27 mEq/L (ref 19–32)
Chloride: 98 mEq/L (ref 96–112)
Creatinine, Ser: 0.9 mg/dL (ref 0.4–1.5)
Potassium: 4.3 mEq/L (ref 3.5–5.1)
Sodium: 131 mEq/L — ABNORMAL LOW (ref 135–145)

## 2013-05-16 LAB — CK: Total CK: 112 U/L (ref 7–232)

## 2013-06-06 DIAGNOSIS — H353190 Nonexudative age-related macular degeneration, unspecified eye, stage unspecified: Secondary | ICD-10-CM | POA: Diagnosis not present

## 2013-08-21 ENCOUNTER — Ambulatory Visit: Payer: Medicare Other

## 2013-08-22 ENCOUNTER — Ambulatory Visit: Payer: Medicare Other

## 2013-09-16 DIAGNOSIS — Z23 Encounter for immunization: Secondary | ICD-10-CM | POA: Diagnosis not present

## 2013-09-25 ENCOUNTER — Other Ambulatory Visit (INDEPENDENT_AMBULATORY_CARE_PROVIDER_SITE_OTHER): Payer: Medicare Other

## 2013-09-25 DIAGNOSIS — R7309 Other abnormal glucose: Secondary | ICD-10-CM

## 2013-09-25 LAB — HEMOGLOBIN A1C: Hgb A1c MFr Bld: 6.1 % (ref 4.6–6.5)

## 2013-09-26 ENCOUNTER — Other Ambulatory Visit: Payer: Self-pay

## 2013-12-18 DIAGNOSIS — L578 Other skin changes due to chronic exposure to nonionizing radiation: Secondary | ICD-10-CM | POA: Diagnosis not present

## 2013-12-18 DIAGNOSIS — D235 Other benign neoplasm of skin of trunk: Secondary | ICD-10-CM | POA: Diagnosis not present

## 2013-12-18 DIAGNOSIS — L570 Actinic keratosis: Secondary | ICD-10-CM | POA: Diagnosis not present

## 2013-12-20 ENCOUNTER — Telehealth: Payer: Self-pay | Admitting: *Deleted

## 2013-12-20 ENCOUNTER — Other Ambulatory Visit: Payer: Self-pay | Admitting: *Deleted

## 2013-12-20 DIAGNOSIS — I10 Essential (primary) hypertension: Secondary | ICD-10-CM

## 2013-12-20 MED ORDER — QUINAPRIL HCL 40 MG PO TABS: 40.0000 mg | ORAL_TABLET | Freq: Every day | ORAL | Status: DC

## 2013-12-20 NOTE — Telephone Encounter (Signed)
Patient's wife called and stated that they have switched pharmacy's and a new script for Quinapril needed to be sent to [facility]. Refill sent. JG//CMA

## 2014-01-08 DIAGNOSIS — M25519 Pain in unspecified shoulder: Secondary | ICD-10-CM | POA: Diagnosis not present

## 2014-01-20 DIAGNOSIS — M25519 Pain in unspecified shoulder: Secondary | ICD-10-CM | POA: Diagnosis not present

## 2014-02-10 DIAGNOSIS — M75120 Complete rotator cuff tear or rupture of unspecified shoulder, not specified as traumatic: Secondary | ICD-10-CM | POA: Diagnosis not present

## 2014-02-12 DIAGNOSIS — L578 Other skin changes due to chronic exposure to nonionizing radiation: Secondary | ICD-10-CM | POA: Diagnosis not present

## 2014-02-13 DIAGNOSIS — M75120 Complete rotator cuff tear or rupture of unspecified shoulder, not specified as traumatic: Secondary | ICD-10-CM | POA: Diagnosis not present

## 2014-02-14 DIAGNOSIS — M75120 Complete rotator cuff tear or rupture of unspecified shoulder, not specified as traumatic: Secondary | ICD-10-CM | POA: Diagnosis not present

## 2014-02-19 NOTE — Progress Notes (Signed)
Surgery on 03/06/14.  Need orders in EPIC.  Thank You.  

## 2014-02-20 ENCOUNTER — Encounter (HOSPITAL_COMMUNITY): Payer: Self-pay | Admitting: Pharmacy Technician

## 2014-02-26 ENCOUNTER — Ambulatory Visit (HOSPITAL_COMMUNITY)
Admission: RE | Admit: 2014-02-26 | Discharge: 2014-02-26 | Disposition: A | Discharge status: Home / Self Care | Payer: Medicare Other | Source: Ambulatory Visit | Attending: Surgical | Admitting: Surgical

## 2014-02-26 ENCOUNTER — Encounter (HOSPITAL_COMMUNITY): Payer: Self-pay

## 2014-02-26 ENCOUNTER — Encounter (HOSPITAL_COMMUNITY)
Admission: RE | Admit: 2014-02-26 | Discharge: 2014-02-26 | Disposition: A | Discharge status: Home / Self Care | Payer: Medicare Other | Source: Ambulatory Visit | Attending: Orthopedic Surgery | Admitting: Orthopedic Surgery

## 2014-02-26 DIAGNOSIS — Z01818 Encounter for other preprocedural examination: Secondary | ICD-10-CM | POA: Diagnosis present

## 2014-02-26 DIAGNOSIS — J984 Other disorders of lung: Secondary | ICD-10-CM | POA: Diagnosis not present

## 2014-02-26 HISTORY — DX: Personal history of other malignant neoplasm of skin: Z85.828

## 2014-02-26 HISTORY — DX: Unspecified rotator cuff tear or rupture of unspecified shoulder, not specified as traumatic: M75.100

## 2014-02-26 LAB — CBC
HCT: 43.1 % (ref 39.0–52.0)
HEMOGLOBIN: 14.5 g/dL (ref 13.0–17.0)
MCH: 28.2 pg (ref 26.0–34.0)
MCHC: 33.6 g/dL (ref 30.0–36.0)
MCV: 83.9 fL (ref 78.0–100.0)
PLATELETS: 271 10*3/uL (ref 150–400)
RBC: 5.14 MIL/uL (ref 4.22–5.81)
RDW: 14.2 % (ref 11.5–15.5)
WBC: 9.3 10*3/uL (ref 4.0–10.5)

## 2014-02-26 LAB — COMPREHENSIVE METABOLIC PANEL
ALT: 25 U/L (ref 0–53)
AST: 28 U/L (ref 0–37)
Albumin: 3.7 g/dL (ref 3.5–5.2)
Alkaline Phosphatase: 77 U/L (ref 39–117)
BUN: 6 mg/dL (ref 6–23)
CO2: 26 mEq/L (ref 19–32)
Calcium: 9.1 mg/dL (ref 8.4–10.5)
Chloride: 95 mEq/L — ABNORMAL LOW (ref 96–112)
Creatinine, Ser: 0.89 mg/dL (ref 0.50–1.35)
GFR calc Af Amer: >90 mL/min (ref >90)
GFR calc non Af Amer: 86 mL/min — ABNORMAL LOW (ref >90)
Glucose, Bld: 101 mg/dL — ABNORMAL HIGH (ref 70–99)
Potassium: 4.5 mEq/L (ref 3.7–5.3)
Sodium: 133 mEq/L — ABNORMAL LOW (ref 137–147)
Total Bilirubin: 0.3 mg/dL (ref 0.3–1.2)
Total Protein: 6.8 g/dL (ref 6.0–8.3)

## 2014-02-26 LAB — URINALYSIS, ROUTINE W REFLEX MICROSCOPIC
Bilirubin Urine: NEGATIVE
Glucose, UA: NEGATIVE mg/dL
Hgb urine dipstick: NEGATIVE
Ketones, ur: NEGATIVE mg/dL
Leukocytes, UA: NEGATIVE
Nitrite: NEGATIVE
Protein, ur: NEGATIVE mg/dL
Specific Gravity, Urine: 1.01 (ref 1.005–1.030)
Urobilinogen, UA: 0.2 mg/dL (ref 0.0–1.0)
pH: 7 (ref 5.0–8.0)

## 2014-02-26 LAB — PROTIME-INR
INR: 0.94 (ref 0.00–1.49)
Prothrombin Time: 12.4 seconds (ref 11.6–15.2)

## 2014-02-26 LAB — APTT: aPTT: 28 seconds (ref 24–37)

## 2014-02-26 NOTE — Patient Instructions (Signed)
Sergio Mack  02/26/2014                           YOUR PROCEDURE IS SCHEDULED ON: 03/06/14               PLEASE REPORT TO SHORT STAY CENTER AT : 10:00 am               CALL THIS NUMBER IF ANY PROBLEMS THE DAY OF SURGERY :               [phone]                                REMEMBER:   Do not eat food or drink liquids AFTER MIDNIGHT   May have clear liquids UNTIL 6 HOURS BEFORE SURGERY (6:30 am)               Take these medicines the morning of surgery with A SIP OF WATER: none   Do not wear jewelry, make-up   Do not wear lotions, powders, or perfumes.   Do not shave legs or underarms 12 hrs. before surgery (men may shave face)  Do not bring valuables to the hospital.  Contacts, dentures or bridgework may not be worn into surgery.  Leave suitcase in the car. After surgery it may be brought to your room.  For patients admitted to the hospital more than one night, checkout time is            11:00 AM                                                       The day of discharge.   Patients discharged the day of surgery will not be allowed to drive home.            If going home same day of surgery, must have someone stay with you              FIRST 24 hrs at home and arrange for some one to drive you              home from hospital.    Special Instructions             Please read over the following fact sheets that you were given:               1. [place]               2. Incentive spirometer                                                X_____________________________________________________________________        Failure to follow these instructions may result in cancellation of your surgery

## 2014-03-05 NOTE — H&P (Signed)
Sergio Mack is an 68 y.o. male.   Chief Complaint: right shoulder pain HPI: The patient is a 68 year old male who presented reporting pain, stiffness, pain with overhead motions, pain with reaching and pain with lifting at the right shoulder that began 01/08/2014. The patient reports that the shoulder symptoms began following a specific injury. The injury resulted from a fall onto the shoulder. The patient reports symptoms which include shoulder pain, shoulder stiffness and decreased range of motion, while reported symptoms do not include tenderness, catching, locking, popping or numbness and tingling in fingers. There is no radiation of symptoms. MRI showed a torn rotator cuff in the right shoulder.   Past Medical History  Diagnosis Date  . History of syncope 1994    no etiology; no recurrence  . Hypertension   . Hyperlipidemia     LDL goal = < 100, ideally < 70  . GERD (gastroesophageal reflux disease)   . Erectile dysfunction   . Diverticulosis 2006  . Rotator cuff tear   . History of skin cancer     Past Surgical History  Procedure Laterality Date  . Tonsillectomy and adenoidectomy    . Cystectomy      lip  . Colonoscopy  2006    Diverticulosis  . Spine surgery  2012    Dr Gladstone Lighter  . Cystectomy      anterior thorax  . Inguinal hernia repair       x 2    Family History  Problem Relation Age of Onset  . Heart attack Father 58  . Diabetes Mother   . Transient ischemic attack Mother 46  . Diabetes Sister   . Heart attack Sister 26  . Stomach cancer Maternal Grandfather    Social History:  reports that he quit smoking about 23 years ago. He does not have any smokeless tobacco history on file. He reports that he does not drink alcohol or use illicit drugs.  Allergies:  Allergies  Allergen Reactions  . Shellfish-Derived Products Itching    Itching feet and palms, tightness in chest    Current outpatient prescriptions: aspirin 81 MG tablet, Take 81 mg by mouth  daily.  , Disp: , Rfl: ;   Cholecalciferol (VITAMIN D3) 2000 UNITS TABS, Take by mouth.  , Disp: , Rfl: ;   Multiple Vitamin (MULTIVITAMIN WITH MINERALS) TABS tablet, Take 1 tablet by mouth daily., Disp: , Rfl: ;   Multiple Vitamins-Minerals (OCUVITE PRESERVISION PO), Take by mouth. 2 by mouth daily once weekly (EXCLUDING WEEKENDS), Disp: , Rfl:  Omega-3 Fatty Acids (FISH OIL) 1000 MG CAPS, Take 1,000 mg by mouth daily.  , Disp: , Rfl: ;   quinapril (ACCUPRIL) 40 MG tablet, Take 40 mg by mouth every morning., Disp: , Rfl: ;   vitamin B-12 (CYANOCOBALAMIN) 1000 MCG tablet, Take 2,000 mcg by mouth daily. , Disp: , Rfl:   Review of Systems  Constitutional: Negative.   HENT: Positive for hearing loss. Negative for congestion, ear discharge, ear pain, nosebleeds, sore throat and tinnitus.   Eyes: Negative.   Respiratory: Negative.  Negative for stridor.   Cardiovascular: Negative.   Gastrointestinal: Negative.   Genitourinary: Negative.   Musculoskeletal: Positive for falls and joint pain. Negative for back pain, myalgias and neck pain.       Right shoulder pain  Skin: Negative.   Neurological: Negative.  Negative for headaches.  Endo/Heme/Allergies: Negative.   Psychiatric/Behavioral: Negative.    Vitals Weight: 195 lb Height: 72 in  Body Surface Area: 2.12 m Body Mass Index: 26.45 kg/m Pulse: 76 (Regular) BP: 130/92 (Sitting, Left Arm, Standard)  Physical Exam  Constitutional: He is oriented to person, place, and time. He appears well-developed and well-nourished. No distress.  HENT:  Head: Normocephalic and atraumatic.  Right Ear: External ear normal.  Left Ear: External ear normal.  Nose: Nose normal.  Mouth/Throat: Oropharynx is clear and moist.  Eyes: Conjunctivae and EOM are normal.  Neck: Normal range of motion. Neck supple.  Cardiovascular: Normal rate, regular rhythm, normal heart sounds and intact distal pulses.   No murmur heard. Respiratory: Effort normal  and breath sounds normal. No respiratory distress. He has no wheezes.  GI: Soft. Bowel sounds are normal. He exhibits no distension. There is no tenderness.  Musculoskeletal:       Right shoulder: He exhibits decreased range of motion, pain and decreased strength.       Left shoulder: Normal.       Right elbow: Normal.      Left elbow: Normal.  He has no significant palpable tenderness. He has markedly diminished range of motion of that shoulder especially forward flexion, abduction. Cuff power is diminished in supraspinatus testing and resisted external rotation and painful. Internal rotation appears to be benign  Neurological: He is alert and oriented to person, place, and time. He has normal reflexes. No sensory deficit.  Skin: No rash noted. He is not diaphoretic. No erythema.  Psychiatric: He has a normal mood and affect. His behavior is normal.     Assessment/Plan Right shoulder rotator cuff tear He needs a right shoulder open rotator cuff repair with graft and anchors. We may or may not need to use a graft material that is made from calf skin. This is approved by the FDA and I have not had a rejection of that graft yet. Also, we may need to use anchors. These are polyethylene anchors that stay in the bone and we use those anchors to suture the tendon down in certain cases where the tendon is completely pulled off the bone. There is always a chance of a secondary infection obviously with any surgery but we do use antibiotics preop.    Sana Tessmer Renelda Loma 03/05/2014, 2:57 PM

## 2014-03-05 NOTE — Progress Notes (Signed)
Pt contacted about surgery time change. Instructed to arrive at short stay at 7:30 am and NPO after midnight

## 2014-03-06 ENCOUNTER — Encounter (HOSPITAL_COMMUNITY)
Admission: RE | Disposition: A | Discharge status: Home / Self Care | Payer: Self-pay | Source: Ambulatory Visit | Attending: Internal Medicine

## 2014-03-06 ENCOUNTER — Encounter (HOSPITAL_COMMUNITY): Payer: Self-pay | Admitting: *Deleted

## 2014-03-06 ENCOUNTER — Encounter (HOSPITAL_COMMUNITY): Payer: Medicare Other | Admitting: *Deleted

## 2014-03-06 ENCOUNTER — Ambulatory Visit (HOSPITAL_COMMUNITY): Payer: Medicare Other | Admitting: *Deleted

## 2014-03-06 ENCOUNTER — Observation Stay (HOSPITAL_COMMUNITY)
Admission: RE | Admit: 2014-03-06 | Discharge: 2014-03-07 | Disposition: A | Discharge status: Home / Self Care | Payer: Medicare Other | Source: Ambulatory Visit | Attending: Internal Medicine | Admitting: Internal Medicine

## 2014-03-06 DIAGNOSIS — R5381 Other malaise: Secondary | ICD-10-CM | POA: Diagnosis not present

## 2014-03-06 DIAGNOSIS — E785 Hyperlipidemia, unspecified: Secondary | ICD-10-CM | POA: Diagnosis not present

## 2014-03-06 DIAGNOSIS — R5383 Other fatigue: Secondary | ICD-10-CM

## 2014-03-06 DIAGNOSIS — Z85828 Personal history of other malignant neoplasm of skin: Secondary | ICD-10-CM | POA: Insufficient documentation

## 2014-03-06 DIAGNOSIS — I369 Nonrheumatic tricuspid valve disorder, unspecified: Secondary | ICD-10-CM | POA: Diagnosis not present

## 2014-03-06 DIAGNOSIS — Z7982 Long term (current) use of aspirin: Secondary | ICD-10-CM | POA: Diagnosis not present

## 2014-03-06 DIAGNOSIS — E782 Mixed hyperlipidemia: Secondary | ICD-10-CM | POA: Diagnosis not present

## 2014-03-06 DIAGNOSIS — I10 Essential (primary) hypertension: Secondary | ICD-10-CM | POA: Insufficient documentation

## 2014-03-06 DIAGNOSIS — I4891 Unspecified atrial fibrillation: Secondary | ICD-10-CM | POA: Insufficient documentation

## 2014-03-06 DIAGNOSIS — Z87891 Personal history of nicotine dependence: Secondary | ICD-10-CM | POA: Insufficient documentation

## 2014-03-06 DIAGNOSIS — R7309 Other abnormal glucose: Secondary | ICD-10-CM | POA: Diagnosis not present

## 2014-03-06 DIAGNOSIS — K219 Gastro-esophageal reflux disease without esophagitis: Secondary | ICD-10-CM | POA: Diagnosis not present

## 2014-03-06 DIAGNOSIS — S43429A Sprain of unspecified rotator cuff capsule, initial encounter: Principal | ICD-10-CM | POA: Insufficient documentation

## 2014-03-06 DIAGNOSIS — Z79899 Other long term (current) drug therapy: Secondary | ICD-10-CM | POA: Insufficient documentation

## 2014-03-06 DIAGNOSIS — W19XXXA Unspecified fall, initial encounter: Secondary | ICD-10-CM | POA: Insufficient documentation

## 2014-03-06 DIAGNOSIS — Z7901 Long term (current) use of anticoagulants: Secondary | ICD-10-CM

## 2014-03-06 DIAGNOSIS — Z5309 Procedure and treatment not carried out because of other contraindication: Secondary | ICD-10-CM | POA: Insufficient documentation

## 2014-03-06 DIAGNOSIS — Z8249 Family history of ischemic heart disease and other diseases of the circulatory system: Secondary | ICD-10-CM | POA: Insufficient documentation

## 2014-03-06 DIAGNOSIS — M75101 Unspecified rotator cuff tear or rupture of right shoulder, not specified as traumatic: Secondary | ICD-10-CM | POA: Diagnosis present

## 2014-03-06 DIAGNOSIS — M19019 Primary osteoarthritis, unspecified shoulder: Secondary | ICD-10-CM | POA: Diagnosis not present

## 2014-03-06 DIAGNOSIS — S46009A Unspecified injury of muscle(s) and tendon(s) of the rotator cuff of unspecified shoulder, initial encounter: Secondary | ICD-10-CM | POA: Insufficient documentation

## 2014-03-06 DIAGNOSIS — I480 Paroxysmal atrial fibrillation: Secondary | ICD-10-CM | POA: Diagnosis present

## 2014-03-06 DIAGNOSIS — I4819 Other persistent atrial fibrillation: Secondary | ICD-10-CM

## 2014-03-06 HISTORY — DX: Ventricular premature depolarization: I49.3

## 2014-03-06 HISTORY — DX: Other persistent atrial fibrillation: I48.19

## 2014-03-06 LAB — CBC WITH DIFFERENTIAL/PLATELET
Basophils Absolute: 0 10*3/uL (ref 0.0–0.1)
Basophils Relative: 0 % (ref 0–1)
EOS ABS: 0.1 10*3/uL (ref 0.0–0.7)
EOS PCT: 1 % (ref 0–5)
HCT: 42.3 % (ref 39.0–52.0)
Hemoglobin: 14.5 g/dL (ref 13.0–17.0)
LYMPHS PCT: 26 % (ref 12–46)
Lymphs Abs: 2.2 10*3/uL (ref 0.7–4.0)
MCH: 28.5 pg (ref 26.0–34.0)
MCHC: 34.3 g/dL (ref 30.0–36.0)
MCV: 83.3 fL (ref 78.0–100.0)
MONOS PCT: 6 % (ref 3–12)
Monocytes Absolute: 0.5 10*3/uL (ref 0.1–1.0)
Neutro Abs: 5.6 10*3/uL (ref 1.7–7.7)
Neutrophils Relative %: 67 % (ref 43–77)
Platelets: 267 10*3/uL (ref 150–400)
RBC: 5.08 MIL/uL (ref 4.22–5.81)
RDW: 14 % (ref 11.5–15.5)
WBC: 8.4 10*3/uL (ref 4.0–10.5)

## 2014-03-06 LAB — TYPE AND SCREEN
ABO/RH(D): O POS
Antibody Screen: NEGATIVE

## 2014-03-06 LAB — BASIC METABOLIC PANEL
BUN: 6 mg/dL (ref 6–23)
CO2: 22 mEq/L (ref 19–32)
CREATININE: 0.86 mg/dL (ref 0.50–1.35)
Calcium: 8.9 mg/dL (ref 8.4–10.5)
Chloride: 97 mEq/L (ref 96–112)
GFR calc Af Amer: >90 mL/min (ref >90)
GFR, EST NON AFRICAN AMERICAN: 87 mL/min — AB (ref >90)
GLUCOSE: 151 mg/dL — AB (ref 70–99)
Potassium: 4.4 mEq/L (ref 3.7–5.3)
Sodium: 132 mEq/L — ABNORMAL LOW (ref 137–147)

## 2014-03-06 LAB — HEPARIN LEVEL (UNFRACTIONATED): Heparin Unfractionated: 0.25 IU/mL — ABNORMAL LOW (ref 0.30–0.70)

## 2014-03-06 LAB — TROPONIN I
Troponin I: <0.3 ng/mL (ref <0.30)
Troponin I: <0.3 ng/mL (ref <0.30)

## 2014-03-06 LAB — PROTIME-INR
INR: 1.09 (ref 0.00–1.49)
PROTHROMBIN TIME: 13.9 s (ref 11.6–15.2)

## 2014-03-06 LAB — APTT: APTT: 89 s — AB (ref 24–37)

## 2014-03-06 LAB — MAGNESIUM: Magnesium: 2.1 mg/dL (ref 1.5–2.5)

## 2014-03-06 LAB — TSH: TSH: 1.18 u[IU]/mL (ref 0.350–4.500)

## 2014-03-06 SURGERY — REPAIR, ROTATOR CUFF, OPEN
Anesthesia: General | Site: Shoulder | Laterality: Right

## 2014-03-06 MED ORDER — QUINAPRIL HCL 10 MG PO TABS: 40.0000 mg | ORAL_TABLET | Freq: Every morning | ORAL | Status: DC

## 2014-03-06 MED ORDER — HEPARIN BOLUS VIA INFUSION
4500.0000 [IU] | Freq: Once | INTRAVENOUS | Status: AC
  Administered 2014-03-06: 4500 [IU] via INTRAVENOUS
  Filled 2014-03-06: qty 4500

## 2014-03-06 MED ORDER — FENTANYL CITRATE 0.05 MG/ML IJ SOLN
INTRAMUSCULAR | Status: AC
  Filled 2014-03-06: qty 5

## 2014-03-06 MED ORDER — LISINOPRIL 40 MG PO TABS
40.0000 mg | ORAL_TABLET | Freq: Every day | ORAL | Status: DC
  Administered 2014-03-07: 40 mg via ORAL
  Filled 2014-03-06: qty 1

## 2014-03-06 MED ORDER — ONDANSETRON HCL 4 MG PO TABS: 4.0000 mg | ORAL_TABLET | Freq: Four times a day (QID) | ORAL | Status: DC | PRN

## 2014-03-06 MED ORDER — LACTATED RINGERS IV SOLN
INTRAVENOUS | Status: DC
  Administered 2014-03-06: 1000 mL via INTRAVENOUS

## 2014-03-06 MED ORDER — ONDANSETRON HCL 4 MG/2ML IJ SOLN: 4.0000 mg | Freq: Four times a day (QID) | INTRAMUSCULAR | Status: DC | PRN

## 2014-03-06 MED ORDER — HEPARIN (PORCINE) IN NACL 100-0.45 UNIT/ML-% IJ SOLN
1300.0000 [IU]/h | INTRAMUSCULAR | Status: DC
  Administered 2014-03-06: 1300 [IU]/h via INTRAVENOUS
  Filled 2014-03-06 (×2): qty 250

## 2014-03-06 MED ORDER — SODIUM CHLORIDE 0.9 % IV SOLN
INTRAVENOUS | Status: DC
  Administered 2014-03-06 – 2014-03-07 (×2): via INTRAVENOUS

## 2014-03-06 MED ORDER — MIDAZOLAM HCL 2 MG/2ML IJ SOLN
INTRAMUSCULAR | Status: AC
  Filled 2014-03-06: qty 2

## 2014-03-06 MED ORDER — BUPIVACAINE LIPOSOME 1.3 % IJ SUSP
20.0000 mL | Freq: Once | INTRAMUSCULAR | Status: DC
  Filled 2014-03-06: qty 20

## 2014-03-06 MED ORDER — ACETAMINOPHEN 325 MG PO TABS: 650.0000 mg | ORAL_TABLET | Freq: Four times a day (QID) | ORAL | Status: DC | PRN

## 2014-03-06 MED ORDER — MENTHOL 3 MG MT LOZG
1.0000 | LOZENGE | OROMUCOSAL | Status: DC | PRN
  Filled 2014-03-06: qty 9

## 2014-03-06 MED ORDER — CEFAZOLIN SODIUM-DEXTROSE 2-3 GM-% IV SOLR
INTRAVENOUS | Status: AC
  Filled 2014-03-06: qty 50

## 2014-03-06 MED ORDER — ASPIRIN 81 MG PO CHEW
81.0000 mg | CHEWABLE_TABLET | Freq: Every day | ORAL | Status: DC
  Administered 2014-03-06 – 2014-03-07 (×2): 81 mg via ORAL
  Filled 2014-03-06 (×2): qty 1

## 2014-03-06 MED ORDER — ROCURONIUM BROMIDE 100 MG/10ML IV SOLN
INTRAVENOUS | Status: AC
  Filled 2014-03-06: qty 1

## 2014-03-06 MED ORDER — HEPARIN (PORCINE) IN NACL 100-0.45 UNIT/ML-% IJ SOLN
1550.0000 [IU]/h | INTRAMUSCULAR | Status: AC
  Administered 2014-03-07: 1550 [IU]/h via INTRAVENOUS
  Filled 2014-03-06 (×3): qty 250

## 2014-03-06 MED ORDER — SODIUM CHLORIDE 0.9 % IV SOLN: INTRAVENOUS | Status: DC

## 2014-03-06 MED ORDER — LACTATED RINGERS IV SOLN: INTRAVENOUS | Status: DC

## 2014-03-06 MED ORDER — LIDOCAINE HCL (CARDIAC) 20 MG/ML IV SOLN
INTRAVENOUS | Status: AC
  Filled 2014-03-06: qty 5

## 2014-03-06 MED ORDER — PHENOL 1.4 % MT LIQD
1.0000 | OROMUCOSAL | Status: DC | PRN
  Filled 2014-03-06: qty 177

## 2014-03-06 MED ORDER — HYDROCODONE-ACETAMINOPHEN 5-325 MG PO TABS: 1.0000 | ORAL_TABLET | ORAL | Status: DC | PRN

## 2014-03-06 MED ORDER — DILTIAZEM HCL 100 MG IV SOLR
5.0000 mg/h | INTRAVENOUS | Status: DC
  Administered 2014-03-06 – 2014-03-07 (×2): 5 mg/h via INTRAVENOUS
  Filled 2014-03-06: qty 100

## 2014-03-06 MED ORDER — CEFAZOLIN SODIUM-DEXTROSE 2-3 GM-% IV SOLR
2.0000 g | INTRAVENOUS | Status: AC
  Administered 2014-03-06: 2 g via INTRAVENOUS

## 2014-03-06 MED ORDER — THROMBIN 5000 UNITS EX SOLR
CUTANEOUS | Status: AC
  Filled 2014-03-06: qty 5000

## 2014-03-06 MED ORDER — PROPOFOL 10 MG/ML IV BOLUS
INTRAVENOUS | Status: AC
  Filled 2014-03-06: qty 20

## 2014-03-06 MED ORDER — ASPIRIN 81 MG PO TABS: 81.0000 mg | ORAL_TABLET | Freq: Every day | ORAL | Status: DC

## 2014-03-06 MED ORDER — ONDANSETRON HCL 4 MG/2ML IJ SOLN
INTRAMUSCULAR | Status: AC
  Filled 2014-03-06: qty 2

## 2014-03-06 MED ORDER — ACETAMINOPHEN 650 MG RE SUPP: 650.0000 mg | Freq: Four times a day (QID) | RECTAL | Status: DC | PRN

## 2014-03-06 MED ORDER — DILTIAZEM HCL 30 MG PO TABS
30.0000 mg | ORAL_TABLET | Freq: Four times a day (QID) | ORAL | Status: DC
  Administered 2014-03-06: 30 mg via ORAL
  Filled 2014-03-06 (×4): qty 1

## 2014-03-06 MED ORDER — MIDAZOLAM HCL 5 MG/5ML IJ SOLN
INTRAMUSCULAR | Status: DC | PRN
  Administered 2014-03-06: 2 mg via INTRAVENOUS

## 2014-03-06 SURGICAL SUPPLY — 45 items
ADH SKN CLS APL DERMABOND .7 (GAUZE/BANDAGES/DRESSINGS) ×1
BAG SPEC THK2 15X12 ZIP CLS (MISCELLANEOUS) ×1
BAG ZIPLOCK 12X15 (MISCELLANEOUS) ×3 IMPLANT
BLADE OSCILLATING/SAGITTAL (BLADE) ×3
BLADE SW THK.38XMED LNG THN (BLADE) ×1 IMPLANT
BNDG COHESIVE 6X5 TAN NS LF (GAUZE/BANDAGES/DRESSINGS) IMPLANT
BUR OVAL CARBIDE 4.0 (BURR) ×3 IMPLANT
CLEANER TIP ELECTROSURG 2X2 (MISCELLANEOUS) ×3 IMPLANT
CLOSURE WOUND 1/2 X4 (GAUZE/BANDAGES/DRESSINGS) ×1
DERMABOND ADVANCED (GAUZE/BANDAGES/DRESSINGS) ×2
DERMABOND ADVANCED .7 DNX12 (GAUZE/BANDAGES/DRESSINGS) ×1 IMPLANT
DRAPE POUCH INSTRU U-SHP 10X18 (DRAPES) ×3 IMPLANT
DRSG AQUACEL AG ADV 3.5X 6 (GAUZE/BANDAGES/DRESSINGS) ×3 IMPLANT
DRSG PAD ABDOMINAL 8X10 ST (GAUZE/BANDAGES/DRESSINGS) ×6 IMPLANT
DURAPREP 26ML APPLICATOR (WOUND CARE) ×3 IMPLANT
ELECT REM PT RETURN 9FT ADLT (ELECTROSURGICAL) ×3
ELECTRODE REM PT RTRN 9FT ADLT (ELECTROSURGICAL) ×1 IMPLANT
GLOVE BIOGEL PI IND STRL 8 (GLOVE) ×1 IMPLANT
GLOVE BIOGEL PI INDICATOR 8 (GLOVE) ×2
GLOVE ECLIPSE 8.0 STRL XLNG CF (GLOVE) ×6 IMPLANT
GLOVE SURG SS PI 6.5 STRL IVOR (GLOVE) ×6 IMPLANT
GOWN STRL REUS W/TWL LRG LVL3 (GOWN DISPOSABLE) ×9 IMPLANT
KIT BASIN OR (CUSTOM PROCEDURE TRAY) ×3 IMPLANT
KIT POSITION SHOULDER SCHLEI (MISCELLANEOUS) ×3 IMPLANT
MANIFOLD NEPTUNE II (INSTRUMENTS) ×3 IMPLANT
NEEDLE MA TROC 1/2 (NEEDLE) IMPLANT
NS IRRIG 1000ML POUR BTL (IV SOLUTION) IMPLANT
PACK SHOULDER CUSTOM OPM052 (CUSTOM PROCEDURE TRAY) ×3 IMPLANT
PASSER SUT SWANSON 36MM LOOP (INSTRUMENTS) IMPLANT
POSITIONER SURGICAL ARM (MISCELLANEOUS) ×3 IMPLANT
SLING ARM IMMOBILIZER LRG (SOFTGOODS) ×3 IMPLANT
SPONGE SURGIFOAM ABS GEL 100 (HEMOSTASIS) IMPLANT
STAPLER VISISTAT 35W (STAPLE) ×3 IMPLANT
STRIP CLOSURE SKIN 1/2X4 (GAUZE/BANDAGES/DRESSINGS) ×2 IMPLANT
SUCTION FRAZIER 12FR DISP (SUCTIONS) ×3 IMPLANT
SUT BONE WAX W31G (SUTURE) ×3 IMPLANT
SUT ETHIBOND NAB CT1 #1 30IN (SUTURE) IMPLANT
SUT MNCRL AB 4-0 PS2 18 (SUTURE) ×3 IMPLANT
SUT VIC AB 0 CT1 27 (SUTURE) ×3
SUT VIC AB 0 CT1 27XBRD ANTBC (SUTURE) ×1 IMPLANT
SUT VIC AB 1 CT1 27 (SUTURE) ×6
SUT VIC AB 1 CT1 27XBRD ANTBC (SUTURE) ×2 IMPLANT
SUT VIC AB 2-0 CT1 27 (SUTURE)
SUT VIC AB 2-0 CT1 27XBRD (SUTURE) IMPLANT
TOWEL OR 17X26 10 PK STRL BLUE (TOWEL DISPOSABLE) ×6 IMPLANT

## 2014-03-06 NOTE — Anesthesia Preprocedure Evaluation (Addendum)
Anesthesia Evaluation  Patient identified by MRN, date of birth, ID band Patient awake    Reviewed: Allergy & Precautions, H&P , NPO status , Patient's Chart, lab work & pertinent test results  Airway Mallampati: II TM Distance: >3 FB Neck ROM: Full    Dental no notable dental hx. (+) Partial Upper   Pulmonary neg pulmonary ROS, former smoker,  breath sounds clear to auscultation  Pulmonary exam normal       Cardiovascular hypertension, Pt. on medications negative cardio ROS  Rhythm:Regular Rate:Normal     Neuro/Psych negative neurological ROS  negative psych ROS   GI/Hepatic negative GI ROS, Neg liver ROS,   Endo/Other  negative endocrine ROS  Renal/GU negative Renal ROS  negative genitourinary   Musculoskeletal negative musculoskeletal ROS (+)   Abdominal   Peds negative pediatric ROS (+)  Hematology negative hematology ROS (+)   Anesthesia Other Findings   Reproductive/Obstetrics negative OB ROS                          Anesthesia Physical Anesthesia Plan  ASA: II  Anesthesia Plan: General   Post-op Pain Management:    Induction: Intravenous  Airway Management Planned: Oral ETT  Additional Equipment:   Intra-op Plan:   Post-operative Plan: Extubation in OR  Informed Consent: I have reviewed the patients History and Physical, chart, labs and discussed the procedure including the risks, benefits and alternatives for the proposed anesthesia with the patient or authorized representative who has indicated his/her understanding and acceptance.   Dental advisory given  Plan Discussed with: CRNA  Anesthesia Plan Comments: (New onset a-fib. Case cancelled prior to induction)       Anesthesia Quick Evaluation

## 2014-03-06 NOTE — Progress Notes (Signed)
ANTICOAGULATION CONSULT NOTE - Follow Up Consult   Pharmacy Consult for Heparin  Indication: atrial fibrillation  Please see previous progress note from Gretta Arab, PharmD for full details.  First heparin level is subtherapeutic (0.25) after 4500 units IV bolus and infusion 1300 units/hr. No bleeding reported per RN  Plan:  Increase heparin to 1550 units/hr  Recheck heparin level in am (~8hr after rate increase)  Peggyann Juba, PharmD, BCPS Pharmacy [phone] 03/06/2014 9:00 PM

## 2014-03-06 NOTE — Consult Note (Signed)
Pt. Seen and examined. Agree with the NP/PA-C note as written. Pleasant 68 yo male who formerly saw Dr. Rayann Heman for palpitations (asymptomatic), now presented for right rotator cuff surgery today and was found to have a-fib with RVR. He was unaware of this. Surgery was cancelled and he was admitted. I have discussed options in management with him and his wife. He is on heparin and we will rate control him with a cardizem gtts. Plan to check a 2D echo due to recent shortness of breath and fatigue to r/o tachycardia induced cardiomyopathy. Since onset of symptoms is not clear, would plan to pursue TEE/Cardioversion, maybe tomorrow at [facility]. Ultimately, he is a good NOAC candidate. He will need outpatient ischemia evaluation as well for completeness and family history of CAD, but he has not had chest pain.  Pixie Casino, MD, [facility] Attending Cardiologist [place]

## 2014-03-06 NOTE — Consult Note (Signed)
Reason for Consult: a flutter with RVR- surgery cancelled   Referring Physician: Dr. Gladstone Lighter PZW:CHENIDP [name], MD Primary Cardiologist:Dr. Allred has seen in the past  Sergio Mack is an 68 y.o. male.    Chief Complaint:  Pt had presented for rt rotator cuff surgery and was found to be in atrial fib. No acute complaints, surgery has been cancelled.  HPI: 68 year old male presented to [facility] for rotator cuff surgery and was found to be in atrial fib.  HR 120-130.  No prior hx of PAF, he has seen Dr. Rayann Heman for freq. PVCs with last visit in 2011.  Nuc study was normal and Echo was normal at that time.    He reported only complaint over last few months increased fatigue, more than usual.  No chest pain, no SOB, no awareness of fast or irregular HR.  + family Hx of premature CAD with sister died at 80 with CAD - sudden death, and father died of sudden death at 57 with MI.  Past Medical History  Diagnosis Date  . History of syncope 1994    no etiology; no recurrence  . Hypertension   . Hyperlipidemia     LDL goal = < 100, ideally < 70  . GERD (gastroesophageal reflux disease)   . Erectile dysfunction   . Diverticulosis 2006  . Rotator cuff tear   . History of skin cancer   . PVC's (premature ventricular contractions)     Past Surgical History  Procedure Laterality Date  . Tonsillectomy and adenoidectomy    . Cystectomy      lip  . Colonoscopy  2006    Diverticulosis  . Spine surgery  2012    Dr Gladstone Lighter  . Cystectomy      anterior thorax  . Inguinal hernia repair       x 2    Family History  Problem Relation Age of Onset  . Heart attack Father 24  . Diabetes Mother   . Transient ischemic attack Mother 30  . Diabetes Sister   . Heart attack Sister 79  . Stomach cancer Maternal Grandfather    Social History:  reports that he quit smoking about 23 years ago. He does not have any smokeless tobacco history on file. He reports that he does not drink alcohol  or use illicit drugs. Married. Works at [employer].  Allergies:  Allergies  Allergen Reactions  . Shellfish-Derived Products Itching    Itching feet and palms, tightness in chest    Medications Prior to Admission  Medication Sig Dispense Refill  . quinapril (ACCUPRIL) 40 MG tablet Take 40 mg by mouth every morning.      Marland Kitchen aspirin 81 MG tablet Take 81 mg by mouth daily.        . Cholecalciferol (VITAMIN D3) 2000 UNITS TABS Take by mouth.        . Multiple Vitamin (MULTIVITAMIN WITH MINERALS) TABS tablet Take 1 tablet by mouth daily.      . Multiple Vitamins-Minerals (OCUVITE PRESERVISION PO) Take by mouth. 2 by mouth daily once weekly (EXCLUDING WEEKENDS)      . Omega-3 Fatty Acids (FISH OIL) 1000 MG CAPS Take 1,000 mg by mouth daily.        . vitamin B-12 (CYANOCOBALAMIN) 1000 MCG tablet Take 2,000 mcg by mouth daily.        Pt stopped fish oil and asa prior to surgery.   No results found  for this or any previous visit (from the past 48 hour(s)). No results found.  ROS: General:no colds or fevers, no weight changes, does snore at night and mild sleep apnea per his wife Skin:no rashes or ulcers HEENT:no blurred vision, no congestion CV:see HPI PUL:see HPI GI:no diarrhea constipation or melena, no indigestion, stools darker since stopping vitamins. GU:no hematuria, no dysuria MS:no joint pain, no claudication Neuro:no syncope, no lightheadedness Endo:no diabetes, no thyroid disease, though last HgBA1C in 2014 was 6.1   Blood pressure 125/75, pulse 102, temperature 98.1 F (36.7 C), temperature source Oral, resp. rate 20, height 6' (1.829 m), weight 192 lb 0.3 oz (87.1 kg), SpO2 100.00%. PE: General:Pleasant affect, NAD, eating lunch Skin:Warm and dry, brisk capillary refill HEENT:normocephalic, sclera clear, mucus membranes moist Neck:supple, no JVD, no bruits, no adenopathy, no thyromegaly   Heart:irreg  irreg without murmur, gallup, rub or click Lungs:clear without rales,  rhonchi, or wheezes OZH:YQMV, non tender, + BS, do not palpate liver spleen or masses Ext:no lower ext edema, 2+ pedal pulses, 2+ radial pulses, + rt shoulder pain with movement, also has occ feet "drawing"  And some lt groin pain on occ. Neuro:alert and oriented X 3, MAE, follows commands, + facial symmetry    Assessment/Plan Principal Problem:   Atrial fibrillation with RVR, new onset, CHA2DS2-VASc score 2,  Begin cardizem now po for rate control, unsure how long pt has been in, on last physical heart rate was regular.  Will check echo, with 2 family members dying with sudden death from MI, may be prudent to do stress test to clear.  Anticoagulation per Dr. Debara Pickett and depending on surgery timing.  Only symptom has been increased fatigue.   Pt is borderline diabetic. Check hgbA1C and thyroid.    Active Problems:   HYPERTENSION, ESSENTIAL NOS   Rotator cuff injury- with need for surgery, had planned for today but now cancelled.    Cecilie Kicks  Nurse Practitioner Certified [place] Pager [phone] or after 5pm or weekends call [phone] 03/06/2014, 12:37 PM

## 2014-03-06 NOTE — Progress Notes (Signed)
Pt arrived to unit from PACU on stretcher. Surgery cancelled d/t new onset afib. Pt on tele 41, confirmed w/ CCMD, noted to be in afib 100-120s. Pt asymptomatic. No SOB/CP/dizziness. Pt and wife oriented to callbell and environment. POC discussed. Callbell and bedside table in reach.

## 2014-03-06 NOTE — Progress Notes (Signed)
Pt continues in atrial fib with occasional bursts into 140s-160s.  Asymptomatic, denies sob/cp/palpitations/dizziness. Serita Butcher, NP for cardiology present and made aware. NP presently entering orders for pt. Will review and implement.

## 2014-03-06 NOTE — Transfer of Care (Signed)
Immediate Anesthesia Transfer of Care Note  Patient: Sergio Mack  Procedure(s) Performed: Procedure(s): RIGHT SHOULDER OPEN ROTATOR CUFF REPAIR WITH GRAFT AND ANCHORS (Right)  Patient Location: PACU  Anesthesia Type:surgery cancelled due to new onset atrial fib  Level of Consciousness: awake, alert , oriented and patient cooperative  Airway & Oxygen Therapy: Patient connected to nasal cannula oxygen  Post-op Assessment: Report given to PACU RN, Post -op Vital signs reviewed and stable and Patient moving all extremities  Post vital signs: Reviewed and stable  Complications: No apparent anesthesia complications

## 2014-03-06 NOTE — Evaluation (Signed)
Patient admitted ar request of Cardiology to Telemetry Bed because of Atrial Fibrillation

## 2014-03-06 NOTE — Progress Notes (Signed)
[facility] RN called to inform this writer that pt is scheduled for TEE/CV at 10am tomorrow. Pt is aware, spoke with Dr Debara Pickett regarding procedures. Aware of need for NPO after MN. [facility] arranged for transport at 0830 tomorrow morning. Pt and wife aware. Pt remains in afib with rate 80-100s on Cardizem gtt 57ml/hr.

## 2014-03-06 NOTE — Consult Note (Signed)
Case postponed because of sudden onset of Atrial Fibrillation. Cardioolgy consult called.

## 2014-03-06 NOTE — Interval H&P Note (Signed)
History and Physical Interval Note:  03/06/2014 9:15 AM  Sergio Mack  has presented today for surgery, with the diagnosis of right shoulder rotator cuff tear  The various methods of treatment have been discussed with the patient and family. After consideration of risks, benefits and other options for treatment, the patient has consented to  Procedure(s): RIGHT SHOULDER OPEN ROTATOR CUFF REPAIR WITH GRAFT AND ANCHORS (Right) as a surgical intervention .  The patient's history has been reviewed, patient examined, no change in status, stable for surgery.  I have reviewed the patient's chart and labs.  Questions were answered to the patient's satisfaction.     Tobi Bastos

## 2014-03-06 NOTE — Progress Notes (Signed)
ANTICOAGULATION CONSULT NOTE - Initial Consult  Pharmacy Consult for Heparin Indication: atrial fibrillation  Allergies  Allergen Reactions  . Shellfish-Derived Products Itching    Itching feet and palms, tightness in chest    Patient Measurements: Height: 6' (182.9 cm) Weight: 192 lb 0.3 oz (87.1 kg) IBW/kg (Calculated) : 77.6  Vital Signs: Temp: 98.1 F (36.7 C) (04/16 1344) Temp src: Oral (04/16 1344) BP: 130/63 mmHg (04/16 1344) Pulse Rate: 101 (04/16 1344)  Labs: No results found for this basename: HGB, HCT, PLT, APTT, LABPROT, INR, HEPARINUNFRC, CREATININE, CKTOTAL, CKMB, TROPONINI,  in the last 72 hours  Estimated Creatinine Clearance: 87.2 ml/min (by C-G formula based on Cr of 0.89).   Medical History: Past Medical History  Diagnosis Date  . History of syncope 1994    no etiology; no recurrence  . Hypertension   . Hyperlipidemia     LDL goal = < 100, ideally < 70  . GERD (gastroesophageal reflux disease)   . Erectile dysfunction   . Diverticulosis 2006  . Rotator cuff tear   . History of skin cancer   . PVC's (premature ventricular contractions)     Medications:  Scheduled:  . diltiazem  30 mg Oral 4 times per day   Infusions:  . sodium chloride 75 mL/hr at 03/06/14 1348    Assessment: 19 yoM presented on 4/15 for right shoulder open rotator cuff repair, but was found to be in atrial fib.  No prior history of atrial fibrillation, but has had PVCs.  Pharmacy is now consulted to dose IV heparin.  Baseline coags: pending  CBC: (last on 02/26/14 Hgb 14.5, Plt 271)  SCr: (last on 02/26/14 SCr 0.89, CrCl ~ 87 ml/min)   Goal of Therapy:  Heparin level 0.3-0.7 units/ml Monitor platelets by anticoagulation protocol: Yes   Plan:   Baseline PTT, PT/INR  Give heparin 4500 units bolus IV x 1  Start heparin IV infusion at 1300 units/hr  Heparin level 6 hours after starting  Daily heparin level and CBC  Continue to monitor H&H and  platelets  Gretta Arab PharmD, BCPS Pager [phone] 03/06/2014 2:06 PM

## 2014-03-06 NOTE — Progress Notes (Signed)
*  PRELIMINARY RESULTS* Echocardiogram 2D Echocardiogram has been performed.  Sergio Mack 03/06/2014, 4:31 PM

## 2014-03-06 NOTE — Brief Op Note (Signed)
03/06/2014  11:23 AM  PATIENT:  Sergio Mack  68 y.o. male  PRE-OPERATIVE DIAGNOSIS:  right shoulder rotator cuff tear  POST-OPERATIVE DIAGNOSIS:  Torn Right Rotator Cuff, Atrial Fibrillation  PROCEDURE:  Surgery was postponed because of sudden onset of Atrial Fibrillation SURGEON:  Surgeon(s) and Role:    * Tobi Bastos, MD - Primary  PHYSICIAN ASSISTANT:none   ASSISTANTS: none  ANESTHESIA:   No anesthesia except pre-op versed  EBL:  Total I/O In: 400 [I.V.:400] Out: -   BLOOD ADMINISTERED:none  DRAINS: none   LOCAL MEDICATIONS USED:  NONE  SPECIMEN:  No Specimen  DISPOSITION OF SPECIMEN:  N/A  COUNTS:  NO No surgery was performed because of Atrial Fibrillation.  TOURNIQUET:  * No tourniquets in log *  DICTATION: .Other Dictation: Dictation Number [id number]  PLAN OF CARE: Admit for overnight observation  PATIENT DISPOSITION:  PACU - hemodynamically stable.   Delay start of Pharmacological VTE agent (>24hrs) due to surgical blood loss or risk of bleeding: yes

## 2014-03-06 NOTE — Anesthesia Postprocedure Evaluation (Signed)
New onset a-fib. Case cancelled prior to induction

## 2014-03-07 ENCOUNTER — Other Ambulatory Visit: Payer: Self-pay | Admitting: Cardiology

## 2014-03-07 ENCOUNTER — Encounter (HOSPITAL_COMMUNITY): Admission: RE | Payer: Self-pay | Source: Ambulatory Visit

## 2014-03-07 ENCOUNTER — Ambulatory Visit (HOSPITAL_COMMUNITY): Admission: RE | Admit: 2014-03-07 | Payer: Medicare Other | Source: Ambulatory Visit | Admitting: Cardiology

## 2014-03-07 ENCOUNTER — Encounter (HOSPITAL_COMMUNITY): Payer: Self-pay | Admitting: Cardiology

## 2014-03-07 DIAGNOSIS — I4891 Unspecified atrial fibrillation: Secondary | ICD-10-CM | POA: Diagnosis not present

## 2014-03-07 DIAGNOSIS — Z8249 Family history of ischemic heart disease and other diseases of the circulatory system: Secondary | ICD-10-CM

## 2014-03-07 DIAGNOSIS — I10 Essential (primary) hypertension: Secondary | ICD-10-CM | POA: Diagnosis not present

## 2014-03-07 DIAGNOSIS — Z7901 Long term (current) use of anticoagulants: Secondary | ICD-10-CM

## 2014-03-07 DIAGNOSIS — E782 Mixed hyperlipidemia: Secondary | ICD-10-CM | POA: Diagnosis not present

## 2014-03-07 LAB — BASIC METABOLIC PANEL
BUN: 10 mg/dL (ref 6–23)
CO2: 22 meq/L (ref 19–32)
Calcium: 8.8 mg/dL (ref 8.4–10.5)
Chloride: 97 mEq/L (ref 96–112)
Creatinine, Ser: 0.86 mg/dL (ref 0.50–1.35)
GFR calc Af Amer: >90 mL/min (ref >90)
GFR calc non Af Amer: 87 mL/min — ABNORMAL LOW (ref >90)
GLUCOSE: 105 mg/dL — AB (ref 70–99)
Potassium: 4.6 mEq/L (ref 3.7–5.3)
SODIUM: 131 meq/L — AB (ref 137–147)

## 2014-03-07 LAB — HEPARIN LEVEL (UNFRACTIONATED)
HEPARIN UNFRACTIONATED: 0.42 [IU]/mL (ref 0.30–0.70)
Heparin Unfractionated: 0.55 [IU]/mL (ref 0.30–0.70)

## 2014-03-07 LAB — T4, FREE: Free T4: 1.4 ng/dL (ref 0.80–1.80)

## 2014-03-07 LAB — TROPONIN I: Troponin I: <0.3 ng/mL (ref <0.30)

## 2014-03-07 SURGERY — ECHOCARDIOGRAM, TRANSESOPHAGEAL
Anesthesia: Moderate Sedation

## 2014-03-07 MED ORDER — APIXABAN 5 MG PO TABS: 5.0000 mg | ORAL_TABLET | Freq: Two times a day (BID) | ORAL | Status: DC

## 2014-03-07 MED ORDER — METOPROLOL TARTRATE 25 MG PO TABS: 25.0000 mg | ORAL_TABLET | Freq: Two times a day (BID) | ORAL | Status: DC

## 2014-03-07 MED ORDER — METOPROLOL TARTRATE 25 MG PO TABS
25.0000 mg | ORAL_TABLET | Freq: Two times a day (BID) | ORAL | Status: DC
  Administered 2014-03-07: 25 mg via ORAL
  Filled 2014-03-07 (×2): qty 1

## 2014-03-07 MED ORDER — APIXABAN 5 MG PO TABS
5.0000 mg | ORAL_TABLET | Freq: Two times a day (BID) | ORAL | Status: DC
  Administered 2014-03-07: 5 mg via ORAL
  Filled 2014-03-07 (×2): qty 1

## 2014-03-07 NOTE — Discharge Summary (Signed)
Physician Discharge Summary       Patient ID: Sergio Mack MRN: [id number] DOB/AGE: 1946/10/24 68 y.o.  Admit date: 03/06/2014 Discharge date: 03/07/2014  Discharge Diagnoses:  Principal Problem:   Atrial fibrillation with RVR, new onset, CHA2DS2-VASc score 2- back to SR at discharge Active Problems:   HYPERTENSION, ESSENTIAL NOS   Rotator cuff tear, right   Anticoagulation adequate, new on eliquis   Discharged Condition: good Primary Cardiologist:  Dr. Rayann Heman Procedures: none  Hospital Course: 68 year old male presented to [facility] for rotator cuff surgery and was found to be in atrial fib. HR 120-130. No prior hx of PAF, he has seen Dr. Rayann Heman for freq. PVCs with last visit in 2011. Nuc study was normal and Echo was normal at that time.   He reported only complaint over last few months increased fatigue, more than usual. No chest pain, no SOB, no awareness of fast or irregular HR. + family Hx of premature CAD with sister died at 28 with CAD - sudden death, and father died of sudden death at 72 with MI.  He was started on IV heparin and IV dilt. BB added to medications.  Troponins were negative for MI.  Around 2000 that evening he converted to SR.  No complaints.  Today he was seen and evaluated by Dr. Martinique and found stable for discharge.  He will follow up with Dr. Rayann Heman and APP after [facility].  His IV heparin was changed to Eliquis.  Will need clearance for rotator cuff repair.      Consults: cardiology  Significant Diagnostic Studies:  BMET    Component Value Date/Time   NA 131* 03/07/2014 0120   K 4.6 03/07/2014 0120   CL 97 03/07/2014 0120   CO2 22 03/07/2014 0120   GLUCOSE 105* 03/07/2014 0120   BUN 10 03/07/2014 0120   CREATININE 0.86 03/07/2014 0120   CALCIUM 8.8 03/07/2014 0120   GFRNONAA 87* 03/07/2014 0120   GFRAA >90 03/07/2014 0120    CBC    Component Value Date/Time   WBC 8.4 03/06/2014 1419   RBC 5.08 03/06/2014 1419   HGB 14.5 03/06/2014 1419   HCT  42.3 03/06/2014 1419   PLT 267 03/06/2014 1419   MCV 83.3 03/06/2014 1419   MCH 28.5 03/06/2014 1419   MCHC 34.3 03/06/2014 1419   RDW 14.0 03/06/2014 1419   LYMPHSABS 2.2 03/06/2014 1419   MONOABS 0.5 03/06/2014 1419   EOSABS 0.1 03/06/2014 1419   BASOSABS 0.0 03/06/2014 1419   Troponin <0.30   2D Echo: Left ventricle: The cavity size was normal. Wall thickness was increased in a pattern of mild LVH. Systolic function was normal. The estimated ejection fraction was in the range of 50% to 55%. Wall motion was normal; there were no regional wall motion abnormalities.    Discharge Exam: Blood pressure 149/96, pulse 86, temperature 98.3 F (36.8 C), temperature source Oral, resp. rate 20, height 6' (1.829 m), weight 192 lb 0.3 oz (87.1 kg), SpO2 98.00%.  Disposition: 01-Home or Self Care       Future Appointments Provider Department Dept Phone   03/11/2014 3:00 PM Aris Georgia, [place] Office [phone]   03/12/2014 12:00 PM [facility] Arloa Koh [place]) [place] [phone]   03/31/2014 8:30 AM Hendricks Limes, MD [facility] [phone]   04/15/2014 9:00 AM Andrez Grime, PA-C [place] Office [phone]  Medication List         apixaban 5 MG Tabs tablet  Commonly known as:  ELIQUIS  Take 1 tablet (5 mg total) by mouth 2 (two) times daily.     aspirin 81 MG tablet  Take 81 mg by mouth daily.     Fish Oil 1000 MG Caps  Take 1,000 mg by mouth daily.     metoprolol tartrate 25 MG tablet  Commonly known as:  LOPRESSOR  Take 1 tablet (25 mg total) by mouth 2 (two) times daily.     multivitamin with minerals Tabs tablet  Take 1 tablet by mouth daily.     OCUVITE PRESERVISION PO  Take by mouth. 2 by mouth daily once weekly (EXCLUDING WEEKENDS)     quinapril 40 MG tablet  Commonly known as:  ACCUPRIL  Take 40 mg by mouth every morning.     vitamin B-12 1000 MCG  tablet  Commonly known as:  CYANOCOBALAMIN  Take 2,000 mcg by mouth daily.     Vitamin D3 2000 UNITS Tabs  Take by mouth.       Follow-up Information   Follow up with Thompson Grayer, MD On 04/15/2014. (at 9:00 AM with his PA, [facility])    Specialty:  Cardiology   Contact information:   [place] [address] [phone]       Follow up with [facility] On 03/11/2014. (at 3:00 pm to review Eliquis and evaluate labs.)    Contact information:   [address] [place] [address]        Discharge Instructions: You  Are scheduled for a Lexiscan myoview March 12, 2014 at 1200pm in Dr. Jackalyn Lombard office.  Nothing to eat or drink after 6:00AM day of procedure.  Heart Healthy diet.   Call if any problems or questions.  Signed: Cecilie Kicks Nurse Practitioner-Certified [facility]: [facility] 03/07/2014, 1:00 PM  Time spent on discharge : 35 minutes.

## 2014-03-07 NOTE — Progress Notes (Signed)
TELEMETRY: Reviewed telemetry pt in NSR. Converted at about 8 am: Filed Vitals:   03/06/14 2116 03/06/14 2300 03/07/14 0100 03/07/14 0623  BP: 103/58  113/86 149/96  Pulse: 73  75 86  Temp: 98.7 F (37.1 C)  98 F (36.7 C) 98.3 F (36.8 C)  TempSrc: Oral  Oral Oral  Resp: 18  20 20   Height:  6' (1.829 m)    Weight:      SpO2: 100%  100% 98%    Intake/Output Summary (Last 24 hours) at 03/07/14 0911 Last data filed at 03/07/14 0300  Gross per 24 hour  Intake 2328.93 ml  Output    775 ml  Net 1553.93 ml    SUBJECTIVE Feels fine. Not aware of Afib. No chest pain or SOB.  LABS: Basic Metabolic Panel:  Recent Labs  03/06/14 1419 03/07/14 0120  NA 132* 131*  K 4.4 4.6  CL 97 97  CO2 22 22  GLUCOSE 151* 105*  BUN 6 10  CREATININE 0.86 0.86  CALCIUM 8.9 8.8  MG 2.1  --    Liver Function Tests: No results found for this basename: AST, ALT, ALKPHOS, BILITOT, PROT, ALBUMIN,  in the last 72 hours No results found for this basename: LIPASE, AMYLASE,  in the last 72 hours CBC:  Recent Labs  03/06/14 1419  WBC 8.4  NEUTROABS 5.6  HGB 14.5  HCT 42.3  MCV 83.3  PLT 267   Cardiac Enzymes:  Recent Labs  03/06/14 1419 03/06/14 2000 03/07/14 0120  TROPONINI <0.30 <0.30 <0.30   BNP: No components found with this basename: POCBNP,  D-Dimer: No results found for this basename: DDIMER,  in the last 72 hours Hemoglobin A1C: No results found for this basename: HGBA1C,  in the last 72 hours Fasting Lipid Panel: No results found for this basename: CHOL, HDL, LDLCALC, TRIG, CHOLHDL, LDLDIRECT,  in the last 72 hours Thyroid Function Tests:  Recent Labs  03/06/14 1330  TSH 1.180   Anemia Panel: No results found for this basename: VITAMINB12, FOLATE, FERRITIN, TIBC, IRON, RETICCTPCT,  in the last 72 hours  Radiology/Studies:  Dg Chest 2 View  02/26/2014   CLINICAL DATA:  Preoperative evaluation for right rotator cuff repair, cough, congestion, recent URI,  hypertension, former smoker  EXAM: CHEST  2 VIEW  COMPARISON:  02/01/2011  FINDINGS: Normal heart size, mediastinal contours, and pulmonary vascularity.  Lungs appear minimally hyperaerated with scarring at left base.  No acute infiltrate, pleural effusion or pneumothorax.  Scattered endplate spur formation and minimal biconvex scoliosis thoracic spine.  IMPRESSION: Minimal hyperaeration with left basilar scarring.  No acute abnormalities.   Electronically Signed   By: Lavonia Dana M.D.   On: 02/26/2014 09:17   Echo:Study Conclusions  Left ventricle: The cavity size was normal. Wall thickness was increased in a pattern of mild LVH. Systolic function was normal. The estimated ejection fraction was in the range of 50% to 55%. Wall motion was normal; there were no regional wall motion abnormalities.   PHYSICAL EXAM General: Well developed, well nourished, in no acute distress. Head: Normocephalic, atraumatic, sclera non-icteric, no xanthomas, nares are without discharge. Neck: Negative for carotid bruits. JVD not elevated. Lungs: Clear bilaterally to auscultation without wheezes, rales, or rhonchi. Breathing is unlabored. Heart: RRR S1 S2 without murmurs, rubs, or gallops.  Abdomen: Soft, non-tender, non-distended with normoactive bowel sounds. No hepatomegaly. No rebound/guarding. No obvious abdominal masses. Msk:  Strength and tone appears normal for age. Extremities: No clubbing,  cyanosis or edema.  Distal pedal pulses are 2+ and equal bilaterally. Neuro: Alert and oriented X 3. Moves all extremities spontaneously. Psych:  Responds to questions appropriately with a normal affect.  ASSESSMENT AND PLAN: 1. Atrial fibrillation. New onset. Now converted spontaneously to NSR. Will DC IV cardizem. Start metoprolol 25 mg bid. Repeat Ecg. Will cancel TEE/DCCV today. Transition IV Heparin to Eliquis. If rhythm remains stable will plan DC later today. 2. HTN 3. Hyperlipidemia. 4. Rotator cuff  injury.   Would recommend stress test as an outpatient to clear for surgery. Patient has seen Dr. Rayann Heman in the past and wants to see him in follow up.  Principal Problem:   Atrial fibrillation with RVR, new onset, CHA2DS2-VASc score 2 Active Problems:   HYPERTENSION, ESSENTIAL NOS   Rotator cuff injury   Rotator cuff tear, right    Signed, Peter M Martinique MD,FACC 03/07/2014 9:16 AM

## 2014-03-07 NOTE — Progress Notes (Signed)
Subjective: 1 Day Post-Op Procedure(s) (LRB): RIGHT SHOULDER OPEN ROTATOR CUFF REPAIR WITH GRAFT AND ANCHORS (Right) Patient reports pain as 1 on 0-10 scale. No major Ortho Problems today. Will transfer to Cardiology today.We will follow in office when he is cleared for surgery.  Objective: Vital signs in last 24 hours: Temp:  [97.8 F (36.6 C)-99 F (37.2 C)] 98.3 F (36.8 C) (04/17 0623) Pulse Rate:  [73-102] 86 (04/17 0623) Resp:  [18-20] 20 (04/17 0623) BP: (103-149)/(58-98) 149/96 mmHg (04/17 0623) SpO2:  [97 %-100 %] 98 % (04/17 0623) Weight:  [87.1 kg (192 lb 0.3 oz)] 87.1 kg (192 lb 0.3 oz) (04/16 1153)  Intake/Output from previous day: 04/16 0701 - 04/17 0700 In: 2328.9 [P.O.:1062; I.V.:1266.9] Out: 775 [Urine:775] Intake/Output this shift:     Recent Labs  03/06/14 1419  HGB 14.5    Recent Labs  03/06/14 1419  WBC 8.4  RBC 5.08  HCT 42.3  PLT 267    Recent Labs  03/06/14 1419 03/07/14 0120  NA 132* 131*  K 4.4 4.6  CL 97 97  CO2 22 22  BUN 6 10  CREATININE 0.86 0.86  GLUCOSE 151* 105*  CALCIUM 8.9 8.8    Recent Labs  03/06/14 1609  INR 1.09    Neurologically intact Will transfer to Cardiology  Assessment/Plan: 1 Day Post-Op Procedure(s) (LRB): RIGHT SHOULDER OPEN ROTATOR CUFF REPAIR WITH GRAFT AND ANCHORS (Right) Discharge home with home health when Cardiologhy decides. Will transfer to Cardiology  Tobi Bastos 03/07/2014, 7:33 AM

## 2014-03-07 NOTE — Op Note (Signed)
[name], Sergio Mack               ACCOUNT NO.:  [id number]  MEDICAL RECORD NO.:  [id number]  LOCATION:  7482                         FACILITY:  [facility]  PHYSICIAN:  Kipp Brood. Marinna Blane, M.D.DATE OF BIRTH:  1946-06-25  DATE OF PROCEDURE: DATE OF DISCHARGE:                              OPERATIVE REPORT   He was brought back to the operating room.  Anesthesia connected the EKGs, etc. and it was noted he had a sudden onset of atrial fibrillation.  So, after we consulted with anesthesia, it was elected to postpone his case.  I did inform his wife.  I did call Dr. Debara Pickett, the cardiologist.  I admitted the patient.  Dr. Debara Pickett will come and see the patient in cardiac consultation.          ______________________________ Kipp Brood Gladstone Lighter, M.D.     RAG/MEDQ  D:  03/06/2014  T:  03/07/2014  Job:  [id number]

## 2014-03-07 NOTE — Care Management Note (Addendum)
    Page 1 of 1   03/07/2014     4:16:09 PM CARE MANAGEMENT NOTE 03/07/2014  Patient:  Sergio Mack, Sergio Mack   Account Number:  [id number]  Date Initiated:  03/07/2014  Documentation initiated by:  Dessa Phi  Subjective/Objective Assessment:   68 Y/O M ADMITTED W/R SHOULDER TEAR.     Action/Plan:   FROM HOME.HAS PCP,PHARMACY.   Anticipated DC Date:  03/07/2014   Anticipated DC Plan:  [place]  CM consult  Medication Assistance      Choice offered to / List presented to:             Status of service:  Completed, signed off Medicare Important Message given?   (If response is "NO", the following Medicare IM given date fields will be blank) Date Medicare IM given:   Date Additional Medicare IM given:    Discharge Disposition:  HOME/SELF CARE  Per UR Regulation:  Reviewed for med. necessity/level of care/duration of stay  If discussed at [place] of Stay Meetings, dates discussed:    Comments:  03/07/14 Lynnex Fulp RN,BSN NCM [place] [employer] 11/02/13 EFFECTIVE.BCBS EXPIRED-11/20/13.PATIENT HAS NEW BC BS COVERAGE.PROVIDED PATIENT W/ELIQUIS STARTER KIT,INCLUDES 30 DAY FREE TRIAL OFFER-MUST ACTIVATE CARD.SPOUSE VOICED UNDERSTANDING OF ACTIVATION OF CARD.USES [facility]-THEY HAVE USED [employer] RX PLAN PER SPOUSE.ADVISED PATIENT TO GO TO ADMITTING DEPT SO THERE CORRECT & CURRENT INSURANCE INFO IS ENTERED.PATIENT/SPOUSE VOICED UNDERSTANDING.NO FURTHER D/C NEEDS. POD#1 R SHOULDER ROTATOR CUFF REPAIR.WILL CHECK BENEFIT FOR ELIQUIS COST.

## 2014-03-07 NOTE — Progress Notes (Signed)
ANTICOAGULATION CONSULT NOTE - Follow up [place] for Apixaban Indication: atrial fibrillation  Allergies  Allergen Reactions  . Shellfish-Derived Products Itching    Itching feet and palms, tightness in chest    Patient Measurements: Height: 6' (182.9 cm) Weight: 192 lb 0.3 oz (87.1 kg) IBW/kg (Calculated) : 77.6  Vital Signs: Temp: 98.3 F (36.8 C) (04/17 0623) Temp src: Oral (04/17 0623) BP: 149/96 mmHg (04/17 0623) Pulse Rate: 86 (04/17 0623)  Labs:  Recent Labs  03/06/14 1419 03/06/14 1609 03/06/14 2000 03/06/14 2014 03/07/14 0120  HGB 14.5  --   --   --   --   HCT 42.3  --   --   --   --   PLT 267  --   --   --   --   APTT  --  89*  --   --   --   LABPROT  --  13.9  --   --   --   INR  --  1.09  --   --   --   HEPARINUNFRC  --   --   --  0.25* 0.42  CREATININE 0.86  --   --   --  0.86  TROPONINI <0.30  --  <0.30  --  <0.30    Estimated Creatinine Clearance: 90.2 ml/min (by C-G formula based on Cr of 0.86).    Medications:  Infusions:  . sodium chloride 75 mL/hr at 03/07/14 0238  . diltiazem (CARDIZEM) infusion 5 mg/hr (03/07/14 0239)  . heparin 1,550 Units/hr (03/07/14 0417)  . lactated ringers      Assessment: 17 yoM presented on 4/15 for right shoulder open rotator cuff repair, but was found to be in atrial fib.  No prior history of atrial fibrillation, but has had PVCs.  Pharmacy initially consulted to dose IV heparin.  On 4/17, he spontaneously converted to NSR.  Pharmacy is now consulted to dose apixaban for long-term oral anticoagulation.  Baseline coags: WNL  CBC: Hgb 14.5, Plt 267 (4/16)  SCr 0.86, CrCl ~ 90 ml/min  Heparin level 0.55, therapeutic   Goal of Therapy:  Heparin level 0.3-0.7 units/ml Monitor platelets by anticoagulation protocol: Yes   Plan:   Discontinue Heparin drip (at the SAME time as first apixaban dose)  Start apixaban 5mg  PO BID  Continue to monitor CBC, renal function  Gretta Arab  PharmD, BCPS Pager [phone] 03/07/2014 8:43 AM

## 2014-03-07 NOTE — Discharge Instructions (Signed)
You  Are scheduled for a Lexiscan myoview March 12, 2014 at 1200pm in Dr. Jackalyn Lombard office.  Nothing to eat or drink after 6:00AM day of procedure.  Heart Healthy diet.   Call if any problems or questions.

## 2014-03-07 NOTE — Progress Notes (Signed)
ANTICOAGULATION CONSULT NOTE - Follow Up Consult  Pharmacy Consult for Heparin Indication: atrial fibrillation  Allergies  Allergen Reactions  . Shellfish-Derived Products Itching    Itching feet and palms, tightness in chest    Patient Measurements: Height: 6' (182.9 cm) Weight: 192 lb 0.3 oz (87.1 kg) IBW/kg (Calculated) : 77.6 Heparin Dosing Weight:   Vital Signs: Temp: 98.3 F (36.8 C) (04/17 0623) Temp src: Oral (04/17 0623) BP: 149/96 mmHg (04/17 0623) Pulse Rate: 86 (04/17 0623)  Labs:  Recent Labs  03/06/14 1419 03/06/14 1609 03/06/14 2000 03/06/14 2014 03/07/14 0120 03/07/14 0840  HGB 14.5  --   --   --   --   --   HCT 42.3  --   --   --   --   --   PLT 267  --   --   --   --   --   APTT  --  89*  --   --   --   --   LABPROT  --  13.9  --   --   --   --   INR  --  1.09  --   --   --   --   HEPARINUNFRC  --   --   --  0.25* 0.42 0.55  CREATININE 0.86  --   --   --  0.86  --   TROPONINI <0.30  --  <0.30  --  <0.30  --     Estimated Creatinine Clearance: 90.2 ml/min (by C-G formula based on Cr of 0.86).   Medications:  Infusions:  . sodium chloride 75 mL/hr at 03/07/14 0238  . heparin 1,550 Units/hr (03/07/14 0417)  . lactated ringers      Assessment: 89 yoM presented on 4/15 for right shoulder open rotator cuff repair, but was found to be in atrial fib. No prior history of atrial fibrillation, but has had PVCs. Pharmacy is now consulted to dose IV heparin.  Heparin level = 0.55 (therapeutic), this is 2nd therapeutic level CBC: WNL 4/16 afternoon, none this am Spontaneously converted into NSR 4/17am --> no TEE/DCCV needed at this time Per cards note - plan to transition to eliquis  Goal of Therapy:  Heparin level 0.3-0.7 units/ml Monitor platelets by anticoagulation protocol: Yes   Plan:   Continue heparin at current rate of 1550 units/hr  Daily heparin level and CBC  Doreene Eland, PharmD, BCPS.   Pager: [phone] 03/07/2014,9:57  AM

## 2014-03-07 NOTE — Progress Notes (Signed)
ANTICOAGULATION CONSULT NOTE - Follow Up Consult  Pharmacy Consult for Heparin Indication: atrial fibrillation  Allergies  Allergen Reactions  . Shellfish-Derived Products Itching    Itching feet and palms, tightness in chest    Patient Measurements: Height: 6' (182.9 cm) Weight: 192 lb 0.3 oz (87.1 kg) IBW/kg (Calculated) : 77.6 Heparin Dosing Weight:   Vital Signs: Temp: 98 F (36.7 C) (04/17 0100) Temp src: Oral (04/17 0100) BP: 113/86 mmHg (04/17 0100) Pulse Rate: 75 (04/17 0100)  Labs:  Recent Labs  03/06/14 1419 03/06/14 1609 03/06/14 2000 03/06/14 2014 03/07/14 0120  HGB 14.5  --   --   --   --   HCT 42.3  --   --   --   --   PLT 267  --   --   --   --   APTT  --  89*  --   --   --   LABPROT  --  13.9  --   --   --   INR  --  1.09  --   --   --   HEPARINUNFRC  --   --   --  0.25* 0.42  CREATININE 0.86  --   --   --  0.86  TROPONINI <0.30  --  <0.30  --  <0.30    Estimated Creatinine Clearance: 90.2 ml/min (by C-G formula based on Cr of 0.86).   Medications:  Infusions:  . sodium chloride 75 mL/hr at 03/07/14 0238  . diltiazem (CARDIZEM) infusion 5 mg/hr (03/07/14 0239)  . heparin 1,550 Units/hr (03/07/14 0417)  . lactated ringers      Assessment: Patient with heparin level at goal.  Level drawn early. No issues per RN.   Goal of Therapy:  Heparin level 0.3-0.7 units/ml Monitor platelets by anticoagulation protocol: Yes   Plan:  Continue heparin at current rate, recheck level at [place]. 03/07/2014,5:52 AM

## 2014-03-09 NOTE — Discharge Summary (Signed)
See my earlier rounding note  Peter Martinique MD, [facility]

## 2014-03-11 ENCOUNTER — Encounter: Payer: Medicare Other | Admitting: Pharmacist

## 2014-03-11 ENCOUNTER — Ambulatory Visit (INDEPENDENT_AMBULATORY_CARE_PROVIDER_SITE_OTHER): Payer: Medicare Other | Admitting: *Deleted

## 2014-03-11 DIAGNOSIS — I4891 Unspecified atrial fibrillation: Secondary | ICD-10-CM

## 2014-03-11 DIAGNOSIS — Z5181 Encounter for therapeutic drug level monitoring: Secondary | ICD-10-CM

## 2014-03-11 NOTE — Progress Notes (Signed)
Pt was started on Eliquis 5mg  bid for Atrial Fib on April 17,2015.    Reviewed patients medication list.  Pt is not currently on any combined P-gp and strong CYP3A4 inhibitors/inducers (ketoconazole, traconazole, ritonavir, carbamazepine, phenytoin, rifampin, St. John's wort).  Reviewed labs.  SCr 0.86, Weight 86.36kg.  Dose is appropriate  based on Labs   Hgb  14.5 and HCT 42.3  A full discussion of the nature of anticoagulants has been carried out.  A benefit/risk analysis has been presented to the patient, so that they understand the justification for choosing anticoagulation with Eliquis at this time.  The need for compliance is stressed.  Pt is aware to take the medication twice daily.  Side effects of potential bleeding are discussed, including unusual colored urine or stools, coughing up blood or coffee ground emesis, nose bleeds or serious fall or head trauma.  Discussed signs and symptoms of stroke. The patient should avoid any OTC items containing aspirin or ibuprofen.  Avoid alcohol consumption.   Call if any signs of abnormal bleeding.  Discussed financial obligations and is not having  any difficulty in obtaining medication.  Next lab test test in 1 month Pt has been on Eliquis since April 17th .Went  Into hospital for surgery on right shoulder rotator cuff surgery and was found to be in Atrial Fib and surgery was cancelled and pt was started on Eliquis 5mg  bid . Complete discussion of Eliquis given to pt and denies any problems on Eliquis at this time Pt's wife talked with nuclear dept regarding his Lexiscan.  03/13/2014 Called pt as did receive order from Dr Martinique that pt should not take ASA when he is on Eliquis. Gave this information to pt's wife and she states understanding.

## 2014-03-12 ENCOUNTER — Ambulatory Visit (HOSPITAL_COMMUNITY): Payer: Medicare Other | Attending: Internal Medicine | Admitting: Radiology

## 2014-03-12 VITALS — BP 122/85 | HR 127 | Ht 72.0 in | Wt 192.0 lb

## 2014-03-12 DIAGNOSIS — I4891 Unspecified atrial fibrillation: Secondary | ICD-10-CM | POA: Diagnosis not present

## 2014-03-12 DIAGNOSIS — Z8249 Family history of ischemic heart disease and other diseases of the circulatory system: Secondary | ICD-10-CM | POA: Insufficient documentation

## 2014-03-12 DIAGNOSIS — I10 Essential (primary) hypertension: Secondary | ICD-10-CM | POA: Insufficient documentation

## 2014-03-12 MED ORDER — TECHNETIUM TC 99M SESTAMIBI GENERIC - CARDIOLITE
33.0000 | Freq: Once | INTRAVENOUS | Status: AC | PRN
  Administered 2014-03-12: 33 via INTRAVENOUS

## 2014-03-12 MED ORDER — TECHNETIUM TC 99M SESTAMIBI GENERIC - CARDIOLITE
11.0000 | Freq: Once | INTRAVENOUS | Status: AC | PRN
  Administered 2014-03-12: 11 via INTRAVENOUS

## 2014-03-12 MED ORDER — REGADENOSON 0.4 MG/5ML IV SOLN
0.4000 mg | Freq: Once | INTRAVENOUS | Status: AC
  Administered 2014-03-12: 0.4 mg via INTRAVENOUS

## 2014-03-12 NOTE — Progress Notes (Signed)
[facility] [address] [place], [place] [address] [phone]    Cardiology Nuclear Med Study  Sergio Mack is a 68 y.o. male     MRN : [id number]     DOB: August 07, 1946  Procedure Date: 03/12/2014  Nuclear Med Background Indication for Stress Test:  Evaluation for Ischemia, Pending Surgical Clearance for  (R) Rotator Cuff Surgery by Latanya Maudlin. MD, and Patient seen in hospital on 03-06-2014 for New onset AFIB/RVR,enzymes negative History:  No known CAD, Afib RVR, Echo 02/2014 Ef 50-55%, MPI 12/18/08 (normal) EF 57% Cardiac Risk Factors: Family History - CAD, History of Smoking, Hypertension and Lipids  Symptoms:  Fatigue   Nuclear Pre-Procedure Caffeine/Decaff Intake:  None > 12 hrs NPO After: 7:00am   Lungs:  clear O2 Sat: 97% on room air. IV 0.9% NS with Angio Cath:  22g  IV Site: L Antecubital x 1, tolerated well IV Started by:  Irven Baltimore, RN  Chest Size (in):  44 Cup Size: n/a  Height: 6' (1.829 m)  Weight:  192 lb (87.091 kg)  BMI:  Body mass index is 26.03 kg/(m^2). Tech Comments:  Last dose Lopressor was 6:00pm yestersday.    Nuclear Med Study 1 or 2 day study: 1 day  Stress Test Type:  Lexiscan  Reading MD: N/A  Order Authorizing Provider:  Thompson Grayer, MD  Resting Radionuclide: Technetium 58m Sestamibi  Resting Radionuclide Dose: 11.0 mCi   Stress Radionuclide:  Technetium 35m Sestamibi  Stress Radionuclide Dose: 33.0 mCi           Stress Protocol Rest HR: 127 Stress HR: 155  Rest BP: 122/85 Stress BP: 142/100  Exercise Time (min): n/a METS: n/a           Dose of Adenosine (mg):  n/a Dose of Lexiscan: 0.4 mg  Dose of Atropine (mg): n/a Dose of Dobutamine: n/a mcg/kg/min (at max HR)  Stress Test Technologist: Glade Lloyd, BS-ES  Nuclear Technologist:  Charlton Amor, CNMT     Rest Procedure:  Myocardial perfusion imaging was performed at rest 45 minutes following the intravenous administration of Technetium 16m  Sestamibi. Rest ECG: Atrial fibrillation. Decreased anterior R wave progression.  Stress Procedure:  The patient received IV Lexiscan 0.4 mg over 15-seconds.  Technetium 30m Sestamibi injected at 30-seconds.  Quantitative spect images were obtained after a 45 minute delay. Stress ECG: No significant change from baseline ECG  QPS Raw Data Images:  Normal; no motion artifact; normal heart/lung ratio. Stress Images:  Normal homogeneous uptake in all areas of the myocardium. Rest Images:  Normal homogeneous uptake in all areas of the myocardium. Subtraction (SDS):  No evidence of ischemia. Transient Ischemic Dilatation (Normal <1.22):  1.42 Lung/Heart Ratio (Normal <0.45):  0.28  Quantitative Gated Spect Images QGS EDV:  n/a QGS ESV:  n/a  Impression Exercise Capacity:  Lexiscan with no exercise. BP Response:  Normal blood pressure response. Clinical Symptoms:  No significant symptoms noted. ECG Impression:  No significant ST segment change suggestive of ischemia. Comparison with Prior Nuclear Study: No images to compare  Overall Impression:  The study reveals no significant abnormality. There is no scar or ischemia. Wall motion data is not available because of gating problems related to atrial fibrillation. This is a low risk scan.  LV Ejection Fraction: Study not gated.  LV Wall Motion:  This study could not be gated because of atrial fibrillation. Wall motion data is not available.  [name] ,  MD

## 2014-03-13 ENCOUNTER — Telehealth: Payer: Self-pay | Admitting: *Deleted

## 2014-03-13 ENCOUNTER — Telehealth: Payer: Self-pay

## 2014-03-13 NOTE — Telephone Encounter (Signed)
He should not take ASA while on Eliquis.  Ariv Penrod Martinique MD, [facility]

## 2014-03-13 NOTE — Telephone Encounter (Signed)
Spoke to [employer] RN.She stated patient came to coumadin clinic to be started on Eliquis.Stated patient was told at discharge from the hospital he should take aspirin and eliquis.Ivin Booty was told she should check with Dr.Jordan who discharged patient if that was ok.Patient was called back and advised do not take aspirin while taking eliquis.Message sent to [place] for review.

## 2014-03-13 NOTE — Telephone Encounter (Signed)
Luanna Salk, LPN Margretta Sidle, RN            Carlos Levering I sent Dr.Jordan a message he advised pt should not take aspirin while taking eliquis.I called pt and told him.

## 2014-03-13 NOTE — Telephone Encounter (Signed)
Message copied by Margretta Sidle on Thu Mar 13, 2014 12:51 PM ------      Message from: Thompson Grayer      Created: Wed Mar 12, 2014  9:10 PM       I have not seen this patient since 2011.  Please have someone who saw him during his recent hospital visit address, otherwise I would have to see in the office before I could make any new recommendations.                              ----- Message -----         From: Margretta Sidle, RN         Sent: 03/11/2014   3:36 PM           To: Thompson Grayer, MD            Mr Licciardi went into hospital for repair of tear right rotator cuff and was found to be in Atrial Fib.Surgery cancelled and was started on Eliquis 5mg  bid on April 17th. He had been taking ASA 81mg  daily and this is still on med list .Talked with Alferd Apa and he states felt as though pt could stop his ASA. Please advise       Thanks       Elbert Ewings RN        ------

## 2014-03-13 NOTE — Telephone Encounter (Signed)
Called and spoke with pt's wife and she states they did receive message for pt to stop his ASA and she states understanding

## 2014-03-18 ENCOUNTER — Telehealth: Payer: Self-pay | Admitting: Internal Medicine

## 2014-03-18 NOTE — Telephone Encounter (Signed)
Aware of results. 

## 2014-03-18 NOTE — Telephone Encounter (Signed)
New message  Pt requests a call back to For Lexiscan results. Please assist

## 2014-03-31 ENCOUNTER — Ambulatory Visit (INDEPENDENT_AMBULATORY_CARE_PROVIDER_SITE_OTHER): Payer: Medicare Other | Admitting: Internal Medicine

## 2014-03-31 ENCOUNTER — Other Ambulatory Visit (INDEPENDENT_AMBULATORY_CARE_PROVIDER_SITE_OTHER): Payer: Medicare Other

## 2014-03-31 ENCOUNTER — Telehealth: Payer: Self-pay | Admitting: Internal Medicine

## 2014-03-31 ENCOUNTER — Encounter: Payer: Self-pay | Admitting: Internal Medicine

## 2014-03-31 VITALS — BP 124/76 | HR 71 | Temp 98.1°F | Ht 71.25 in | Wt 194.6 lb

## 2014-03-31 DIAGNOSIS — E782 Mixed hyperlipidemia: Secondary | ICD-10-CM

## 2014-03-31 DIAGNOSIS — R7309 Other abnormal glucose: Secondary | ICD-10-CM

## 2014-03-31 DIAGNOSIS — I4891 Unspecified atrial fibrillation: Secondary | ICD-10-CM | POA: Diagnosis not present

## 2014-03-31 DIAGNOSIS — I10 Essential (primary) hypertension: Secondary | ICD-10-CM | POA: Diagnosis not present

## 2014-03-31 LAB — LIPID PANEL
CHOL/HDL RATIO: 3
Cholesterol: 121 mg/dL (ref 0–200)
HDL: 39.5 mg/dL (ref >39.00)
LDL CALC: 76 mg/dL (ref 0–99)
Triglycerides: 27 mg/dL (ref 0.0–149.0)
VLDL: 5.4 mg/dL (ref 0.0–40.0)

## 2014-03-31 LAB — HEPATIC FUNCTION PANEL
ALT: 21 U/L (ref 0–53)
AST: 25 U/L (ref 0–37)
Albumin: 4 g/dL (ref 3.5–5.2)
Alkaline Phosphatase: 62 U/L (ref 39–117)
BILIRUBIN DIRECT: 0.1 mg/dL (ref 0.0–0.3)
BILIRUBIN TOTAL: 0.9 mg/dL (ref 0.2–1.2)
Total Protein: 7 g/dL (ref 6.0–8.3)

## 2014-03-31 LAB — TSH: TSH: 0.87 u[IU]/mL (ref 0.35–4.50)

## 2014-03-31 LAB — HEMOGLOBIN A1C: HEMOGLOBIN A1C: 6.1 % (ref 4.6–6.5)

## 2014-03-31 MED ORDER — QUINAPRIL HCL 40 MG PO TABS
40.0000 mg | ORAL_TABLET | Freq: Every morning | ORAL | Status: DC
Start: 2014-03-31 — End: 2014-04-18

## 2014-03-31 NOTE — Assessment & Plan Note (Signed)
Lipids, LFTs, TSH  

## 2014-03-31 NOTE — Progress Notes (Signed)
Pre visit review using our clinic review tool, if applicable. No additional management support is needed unless otherwise documented below in the visit note. 

## 2014-03-31 NOTE — Assessment & Plan Note (Signed)
BP discussed

## 2014-03-31 NOTE — Patient Instructions (Signed)

## 2014-03-31 NOTE — Assessment & Plan Note (Signed)
A1c

## 2014-03-31 NOTE — Progress Notes (Signed)
Subjective:    Patient ID: Sergio Mack, male    DOB: 1946/07/11, 68 y.o.   MRN: [id number]  HPI  He is here to assess status of active health conditions:  Diet/ nutrition:no Exercise program:gardening   HYPERTENSION: Disease Monitoring: Blood pressure range/ average : 145/85 on average Medication Compliance:yes  FASTING HYPERGLYCEMIA  :  FBS range/average:no monitor Highest 2 hr post meal glucose:no monitor Medication compliance:no meds Hypoglycemia:no Ophthamology care:UTD Podiatry care:not UTD  HYPERLIPIDEMIA: Disease Monitoring: Medication Compliance:statin D/Ced by Dr Rayann Heman ?2010      Review of Systems  Chest pain, palpitations:no       Dyspnea:no Edema:no Claudication: no Lightheadedness,Syncope:no Weight gain/loss:stable Polyuria/phagia/dipsia: no     Blurred vision /diplopia/lossof vision:no Limb numbness/tingling/burning:no Non healing skin lesions:no; sees Dr Allyson Sabal Abd pain, bowel changes: no  Myalgias: no but rotator cuff still symptomatic Memory loss:no      Objective:   Physical Exam Significant or distinguishing  findings on physical exam include:  Gen.: Healthy and well-nourished in appearance. Alert, appropriate and cooperative throughout exam. Appears younger than stated age  Head: Normocephalic without obvious abnormalities;  Pattern alopecia  Eyes: No corneal or conjunctival inflammation noted. Pupils equal round reactive to light and accommodation. Extraocular motion intact.  Ears: External  ear exam reveals no significant lesions or deformities. Canals clear .TMs normal. Hearing aids bilaterally. Nose: External nasal exam reveals no deformity or inflammation. Nasal mucosa are pink and moist. No lesions or exudates noted.   Mouth: Oral mucosa and oropharynx reveal no lesions or exudates. Upper partial & lower plate. Neck: No deformities, masses, or tenderness noted. Range of motion normal. Thyroid small Lungs: Normal respiratory  effort; chest expands symmetrically. Lungs are clear to auscultation without rales, wheezes, or increased work of breathing. Heart: Normal rate and rhythm. Normal S1 and S2. No gallop, click, or rub. S4 with slurring; no murmur. Abdomen: Protuberant; umbilical hernia.Bowel sounds normal; abdomen soft and nontender. No massesor organomegaly noted. Genitalia: deferred;PSA 0.56 in 2011          Musculoskeletal/extremities: Accentuated curvature of thoracic spine. No clubbing, cyanosis, edema, or significant extremity  deformity noted. Range of motion normal .Tone & strength normal. Hand joints normal Fingernail  health good. Able to lie down & sit up w/o help. Negative SLR bilaterally Vascular: Carotid, radial artery, dorsalis pedis and  posterior tibial pulses are full and equal. No bruits present. Neurologic: Alert and oriented x3. Deep tendon reflexes symmetrical but 0-1/2+.        Skin: Intact without suspicious lesions or rashes. Lymph: No cervical, axillary lymphadenopathy present. Psych: Mood and affect are normal. Normally interactive                                                                                        Assessment & Plan:  See Current Assessment & Plan in Problem List under specific Diagnosis

## 2014-03-31 NOTE — Telephone Encounter (Signed)
Relevant patient education assigned to patient using Emmi. ° °

## 2014-04-10 ENCOUNTER — Ambulatory Visit (INDEPENDENT_AMBULATORY_CARE_PROVIDER_SITE_OTHER): Payer: Medicare Other | Admitting: *Deleted

## 2014-04-10 ENCOUNTER — Encounter: Payer: Self-pay | Admitting: Internal Medicine

## 2014-04-10 ENCOUNTER — Ambulatory Visit (INDEPENDENT_AMBULATORY_CARE_PROVIDER_SITE_OTHER): Payer: Medicare Other | Admitting: Internal Medicine

## 2014-04-10 VITALS — BP 128/78 | HR 67 | Ht 71.0 in | Wt 194.0 lb

## 2014-04-10 DIAGNOSIS — Z01818 Encounter for other preprocedural examination: Secondary | ICD-10-CM | POA: Diagnosis not present

## 2014-04-10 DIAGNOSIS — I4891 Unspecified atrial fibrillation: Secondary | ICD-10-CM

## 2014-04-10 DIAGNOSIS — I10 Essential (primary) hypertension: Secondary | ICD-10-CM | POA: Diagnosis not present

## 2014-04-10 LAB — CBC
HCT: 38.6 % — ABNORMAL LOW (ref 39.0–52.0)
HEMOGLOBIN: 12.9 g/dL — AB (ref 13.0–17.0)
MCHC: 33.5 g/dL (ref 30.0–36.0)
MCV: 86.3 fl (ref 78.0–100.0)
Platelets: 270 10*3/uL (ref 150.0–400.0)
RBC: 4.48 Mil/uL (ref 4.22–5.81)
RDW: 14.1 % (ref 11.5–15.5)
WBC: 10.8 10*3/uL — ABNORMAL HIGH (ref 4.0–10.5)

## 2014-04-10 LAB — BASIC METABOLIC PANEL
BUN: 12 mg/dL (ref 6–23)
CHLORIDE: 98 meq/L (ref 96–112)
CO2: 28 mEq/L (ref 19–32)
Calcium: 8.9 mg/dL (ref 8.4–10.5)
Creatinine, Ser: 1.2 mg/dL (ref 0.4–1.5)
GFR: 65.77 mL/min (ref >60.00)
Glucose, Bld: 86 mg/dL (ref 70–99)
Potassium: 4.1 mEq/L (ref 3.5–5.1)
SODIUM: 131 meq/L — AB (ref 135–145)

## 2014-04-10 MED ORDER — METOPROLOL TARTRATE 25 MG PO TABS: 25.0000 mg | ORAL_TABLET | Freq: Two times a day (BID) | ORAL | Status: DC

## 2014-04-10 NOTE — Patient Instructions (Signed)
Your physician recommends that you schedule a follow-up appointment in 3 months with Dr Allred    

## 2014-04-10 NOTE — Progress Notes (Signed)
PCP:  Unice Cobble, MD  The patient presents today for electrophysiology followup.  I saw him last in 2011.  He did well until recently presented preoperatively for shoulder surgery. He was found to have afib with RVR for which he was mostly asymptomatic.  In retrospect, he did have some fatigue.  He had an echo and myoview which were low risk.  He spontaneously converted to sinus rhythm and was initiated on metoprolol and eliquis.  He is tolerating these medicines well.  He has done well since he left the hospital without any symptoms of arrhythmia.   Today, he denies symptoms of palpitations, chest pain, shortness of breath, orthopnea, PND, lower extremity edema, dizziness, presyncope, syncope, or neurologic sequela.  The patient feels that he is tolerating medications without difficulties and is otherwise without complaint today.   Past Medical History  Diagnosis Date  . History of syncope 1994    no etiology; no recurrence  . Hypertension   . Hyperlipidemia     LDL goal = < 100, ideally < 70  . GERD (gastroesophageal reflux disease)   . Erectile dysfunction   . Diverticulosis 2006  . Rotator cuff tear   . History of skin cancer   . PVC's (premature ventricular contractions)   . Anticoagulation adequate, new on eliquis 03/07/2014  . Atrial fibrillation with RVR, new onset, CHA2DS2-VASc score 2- back to SR at discharge 03/06/2014   Past Surgical History  Procedure Laterality Date  . Tonsillectomy and adenoidectomy    . Cystectomy      lip  . Colonoscopy  2006    Diverticulosis  . Spine surgery  2012    Dr Gladstone Lighter  . Cystectomy      anterior thorax  . Inguinal hernia repair       x 2    Current Outpatient Prescriptions  Medication Sig Dispense Refill  . apixaban (ELIQUIS) 5 MG TABS tablet Take 1 tablet (5 mg total) by mouth 2 (two) times daily.  60 tablet  0  . Cholecalciferol (VITAMIN D3) 2000 UNITS TABS Take 1 capsule by mouth daily.       . metoprolol tartrate  (LOPRESSOR) 25 MG tablet Take 1 tablet (25 mg total) by mouth 2 (two) times daily.  180 tablet  3  . Multiple Vitamin (MULTIVITAMIN WITH MINERALS) TABS tablet Take 1 tablet by mouth daily.      . Multiple Vitamins-Minerals (OCUVITE PRESERVISION PO) Take 2 capsules by mouth daily  (EXCLUDING WEEKENDS)      . NON FORMULARY Vinegar - Take 1.5oz as needed      . Omega-3 Fatty Acids (FISH OIL) 1000 MG CAPS Take 1,000 mg by mouth daily.        . quinapril (ACCUPRIL) 40 MG tablet Take 1 tablet (40 mg total) by mouth every morning.  90 tablet  3  . tadalafil (CIALIS) 20 MG tablet Take 20 mg by mouth daily as needed for erectile dysfunction.      . vitamin B-12 (CYANOCOBALAMIN) 1000 MCG tablet Take 2,000 mcg by mouth daily.        No current facility-administered medications for this visit.    Allergies  Allergen Reactions  . Shellfish-Derived Products Itching    Itching feet and palms, tightness in chest    History   Social History  . Marital Status: Married    Spouse Name: N/A    Number of Children: N/A  . Years of Education: N/A   Occupational History  . Not  on file.   Social History Main Topics  . Smoking status: Former Smoker    Quit date: 04/26/1990  . Smokeless tobacco: Not on file     Comment: smoked [place] with 12 year abstinence ( 21 total years of smoking), up to 1 ppd  . Alcohol Use: No  . Drug Use: No  . Sexual Activity: Not on file   Other Topics Concern  . Not on file   Social History Narrative  . No narrative on file    Family History  Problem Relation Age of Onset  . Heart attack Father 50  . Diabetes Mother   . Transient ischemic attack Mother 78  . Diabetes Sister   . Heart attack Sister 70  . Stomach cancer Maternal Grandfather     ROS-  All systems are reviewed and are negative except as outlined in the HPI above  Physical Exam: Filed Vitals:   04/10/14 1601  BP: 128/78  Pulse: 67  Height: 5\' 11"  (1.803 m)  Weight: 194 lb (87.998 kg)     GEN- The patient is well appearing, alert and oriented x 3 today.   Head- normocephalic, atraumatic Eyes-  Sclera clear, conjunctiva pink Ears- hearing intact Oropharynx- clear Neck- supple, no JVP Lymph- no cervical lymphadenopathy Lungs- Clear to ausculation bilaterally, normal work of breathing Heart- Regular rate and rhythm, no murmurs, rubs or gallops, PMI not laterally displaced GI- soft, NT, ND, + BS Extremities- no clubbing, cyanosis, or edema MS- no significant deformity or atrophy Skin- no rash or lesion Psych- euthymic mood, full affect Neuro- strength and sensation are intact  ekg today reveals sinus rhythm 67 bpm, otherwise normal ekg Epic records including recent hospital notes, echo, and myoview are reviewed  Assessment and Plan:  1. Paroxysmal atrial fibrillation The patient was recently documented to have afib with RVR for which he was minimally symptomatic.  He remains in sinus presently off of AAD therapy.  He is appropriately anticoagulated with eliquis as his chads2vasc score is at least 2.  I will make no changes today.  If he develops further symptoms of afib then I would favor flecainide at that time.  2. Preoperative clearance Recent echo and myoview are low risk He is presently in sinus I would recommend that he proceed with previously planned shoulder surgery if medically necessary. If he has perioperative atrial fibrillation then he can be rate controlled in the standard fashion post operatively. Hold eliquis for 24-48 hours preoperatively and restart 24 hours after surgery  3. htn Stable No change required today  Return to see me in 3 months

## 2014-04-11 NOTE — Progress Notes (Signed)
Pt was started on Eliquis for Afib on 03/07/2014.    Reviewed patients medication list.  Pt is not  currently on any combined P-gp and strong CYP3A4 inhibitors/inducers (ketoconazole, traconazole, ritonavir, carbamazepine, phenytoin, rifampin, St. John's wort).  Reviewed labs.  SCr 1.2  Weight 89.5 Kg. Age 68 yrs. Dose appropriate based on specified criteria.   Hgb and HCT are slightly lower, thus Pharm D, Dr Smart instructed to recheck CBC in 1 mth, telephoned spouse and made her aware and appt s/c for 05/12/14 for repeat CBC.   A full discussion of the nature of anticoagulants has been carried out.  A benefit/risk analysis has been presented to the patient, so that they understand the justification for choosing anticoagulation with Eliquis at this time.  The need for compliance is stressed.  Pt is aware to take the medication twice daily.  Side effects of potential bleeding are discussed, including unusual colored urine or stools, coughing up blood or coffee ground emesis, nose bleeds or serious fall or head trauma.  Discussed signs and symptoms of stroke. The patient should avoid any OTC items containing aspirin or ibuprofen.  Avoid alcohol consumption.   Call if any signs of abnormal bleeding.  Discussed financial obligations and resolved any difficulty in obtaining medication.  Next lab test test in 1 month CBC.

## 2014-04-15 ENCOUNTER — Encounter: Payer: Medicare Other | Admitting: Cardiology

## 2014-04-16 ENCOUNTER — Telehealth: Payer: Self-pay | Admitting: Internal Medicine

## 2014-04-16 NOTE — Telephone Encounter (Signed)
Received request from Nurse fax box, documents faxed for surgical clearance. To: Antionette Char  Fax number: [phone] Attention: 5.27.15/km

## 2014-04-17 ENCOUNTER — Encounter (HOSPITAL_COMMUNITY): Payer: Self-pay | Admitting: Pharmacy Technician

## 2014-04-17 NOTE — Progress Notes (Addendum)
Need orders please - pt surg is Wed 04/23/14 - thank you  Pt coming for preop testing Fri 04/18/14

## 2014-04-17 NOTE — Patient Instructions (Addendum)
20     Your procedure is scheduled on:  Wednesday 04/23/2014  Report to [facility] Main Entrance and follow signs to Short Stay  at  0630 AM.  Call this number if you have problems the night before or morning of surgery:  [phone]   Remember:             IF YOU USE CPAP,BRING MASK AND TUBING AM OF SURGERY!             IF YOU DO NOT HAVE YOUR TYPE AND SCREEN DRAWN AT PRE-ADMIT APPOINTMENT, YOU WILL HAVE IT DRAWN AM OF SURGERY!   Do not eat food or drink liquids AFTER MIDNIGHT!  Take these medicines the morning of surgery with A SIP OF WATER: Metoprolol    [facility] IS NOT RESPONSIBLE FOR ANY BELONGINGS OR VALUABLES BROUGHT TO HOSPITAL.  Marland Kitchen  Leave suitcase in the car. After surgery it may be brought to your room.  For patients admitted to the hospital, checkout time is 11:00 AM the day of              Discharge.    DO NOT WEAR JEWELRY,MAKE-UP,LOTIONS,POWDERS,PERFUMES,CONTACTS , DENTURES OR BRIDGEWORK ,AND DO NOT WEAR FALSE EYELASHES                                    Patients discharged the day of surgery will not be allowed to drive home.   If going home the same day of surgery, must have someone stay with you  first 24 hrs.at home and arrange for someone to drive you home from the [place] IS:N/A   Special Instructions:              Please read over the following fact sheets that you were given:             1. [place].Tobin Chad     [phone]                              [facility] - Preparing for Surgery Before surgery, you can play an important role.  Because skin is not sterile, your skin needs to be as free of germs as possible.  You can reduce the number of germs on your skin by washing with CHG (chlorahexidine gluconate) soap before surgery.  CHG is an antiseptic cleaner which kills germs and bonds with  the skin to continue killing germs even after washing. Please DO NOT use if you have an allergy to CHG or antibacterial soaps.  If your skin becomes reddened/irritated stop using the CHG and inform your nurse when you arrive at Short Stay. Do not shave (including legs and underarms) for at least 48 hours prior to the first CHG shower.  You may shave your face/neck. Please follow these instructions carefully:  1.  Shower with CHG Soap the night  before surgery and the  morning of Surgery.  2.  If you choose to wash your hair, wash your hair first as usual with your  normal  shampoo.  3.  After you shampoo, rinse your hair and body thoroughly to remove the  shampoo.                           4.  Use CHG as you would any other liquid soap.  You can apply chg directly  to the skin and wash                       Gently with a scrungie or clean washcloth.  5.  Apply the CHG Soap to your body ONLY FROM THE NECK DOWN.   Do not use on face/ open                           Wound or open sores. Avoid contact with eyes, ears mouth and genitals (private parts).                       Wash face,  Genitals (private parts) with your normal soap.             6.  Wash thoroughly, paying special attention to the area where your surgery  will be performed.  7.  Thoroughly rinse your body with warm water from the neck down.  8.  DO NOT shower/wash with your normal soap after using and rinsing off  the CHG Soap.                9.  Pat yourself dry with a clean towel.            10.  Wear clean pajamas.            11.  Place clean sheets on your bed the night of your first shower and do not  sleep with pets. Day of Surgery : Do not apply any lotions/deodorants the morning of surgery.  Please wear clean clothes to the hospital/surgery center.  FAILURE TO FOLLOW THESE INSTRUCTIONS MAY RESULT IN THE CANCELLATION OF YOUR SURGERY PATIENT SIGNATURE_________________________________  NURSE  SIGNATURE__________________________________  ________________________________________________________________________   Adam Phenix  An incentive spirometer is a tool that can help keep your lungs clear and active. This tool measures how well you are filling your lungs with each breath. Taking long deep breaths may help reverse or decrease the chance of developing breathing (pulmonary) problems (especially infection) following:  A long period of time when you are unable to move or be active. BEFORE THE PROCEDURE   If the spirometer includes an indicator to show your best effort, your nurse or respiratory therapist will set it to a desired goal.  If possible, sit up straight or lean slightly forward. Try not to slouch.  Hold the incentive spirometer in an upright position. INSTRUCTIONS FOR USE  1. Sit on the edge of your bed if possible, or sit up as far as you can in bed or on a chair. 2. Hold the incentive spirometer in an upright position. 3. Breathe out normally. 4. Place the mouthpiece in your mouth and seal your lips tightly around it. 5. Breathe in slowly and as deeply as possible, raising the piston or the ball toward the top of the column. 6. Hold your breath for 3-5 seconds or  for as long as possible. Allow the piston or ball to fall to the bottom of the column. 7. Remove the mouthpiece from your mouth and breathe out normally. 8. Rest for a few seconds and repeat Steps 1 through 7 at least 10 times every 1-2 hours when you are awake. Take your time and take a few normal breaths between deep breaths. 9. The spirometer may include an indicator to show your best effort. Use the indicator as a goal to work toward during each repetition. 10. After each set of 10 deep breaths, practice coughing to be sure your lungs are clear. If you have an incision (the cut made at the time of surgery), support your incision when coughing by placing a pillow or rolled up towels firmly  against it. Once you are able to get out of bed, walk around indoors and cough well. You may stop using the incentive spirometer when instructed by your caregiver.  RISKS AND COMPLICATIONS  Take your time so you do not get dizzy or light-headed.  If you are in pain, you may need to take or ask for pain medication before doing incentive spirometry. It is harder to take a deep breath if you are having pain. AFTER USE  Rest and breathe slowly and easily.  It can be helpful to keep track of a log of your progress. Your caregiver can provide you with a simple table to help with this. If you are using the spirometer at home, follow these instructions: [place] IF:   You are having difficultly using the spirometer.  You have trouble using the spirometer as often as instructed.  Your pain medication is not giving enough relief while using the spirometer.  You develop fever of 100.5 F (38.1 C) or higher. SEEK IMMEDIATE MEDICAL CARE IF:   You cough up bloody sputum that had not been present before.  You develop fever of 102 F (38.9 C) or greater.  You develop worsening pain at or near the incision site. MAKE SURE YOU:   Understand these instructions.  Will watch your condition.  Will get help right away if you are not doing well or get worse. Document Released: 03/20/2007 Document Revised: 01/30/2012 Document Reviewed: 05/21/2007 [facility] Patient Information 2014 [place], [place].   ________________________________________________________________________

## 2014-04-18 ENCOUNTER — Other Ambulatory Visit: Payer: Self-pay

## 2014-04-18 ENCOUNTER — Other Ambulatory Visit: Payer: Self-pay | Admitting: *Deleted

## 2014-04-18 ENCOUNTER — Encounter (HOSPITAL_COMMUNITY): Payer: Self-pay

## 2014-04-18 ENCOUNTER — Encounter (HOSPITAL_COMMUNITY)
Admission: RE | Admit: 2014-04-18 | Discharge: 2014-04-18 | Disposition: A | Discharge status: Home / Self Care | Payer: Medicare Other | Source: Ambulatory Visit | Attending: Orthopedic Surgery | Admitting: Orthopedic Surgery

## 2014-04-18 DIAGNOSIS — I4891 Unspecified atrial fibrillation: Secondary | ICD-10-CM

## 2014-04-18 DIAGNOSIS — I10 Essential (primary) hypertension: Secondary | ICD-10-CM

## 2014-04-18 DIAGNOSIS — Z01812 Encounter for preprocedural laboratory examination: Secondary | ICD-10-CM | POA: Diagnosis not present

## 2014-04-18 LAB — BASIC METABOLIC PANEL
BUN: 10 mg/dL (ref 6–23)
CHLORIDE: 96 meq/L (ref 96–112)
CO2: 23 meq/L (ref 19–32)
Calcium: 9.1 mg/dL (ref 8.4–10.5)
Creatinine, Ser: 0.87 mg/dL (ref 0.50–1.35)
GFR calc Af Amer: >90 mL/min (ref >90)
GFR calc non Af Amer: 87 mL/min — ABNORMAL LOW (ref >90)
Glucose, Bld: 107 mg/dL — ABNORMAL HIGH (ref 70–99)
Potassium: 3.9 mEq/L (ref 3.7–5.3)
SODIUM: 131 meq/L — AB (ref 137–147)

## 2014-04-18 LAB — CBC
HCT: 41.8 % (ref 39.0–52.0)
Hemoglobin: 13.9 g/dL (ref 13.0–17.0)
MCH: 28 pg (ref 26.0–34.0)
MCHC: 33.3 g/dL (ref 30.0–36.0)
MCV: 84.1 fL (ref 78.0–100.0)
Platelets: 283 10*3/uL (ref 150–400)
RBC: 4.97 MIL/uL (ref 4.22–5.81)
RDW: 13.8 % (ref 11.5–15.5)
WBC: 13.3 10*3/uL — AB (ref 4.0–10.5)

## 2014-04-18 MED ORDER — QUINAPRIL HCL 40 MG PO TABS: 40.0000 mg | ORAL_TABLET | Freq: Every morning | ORAL | Status: DC

## 2014-04-18 MED ORDER — METOPROLOL TARTRATE 25 MG PO TABS: 25.0000 mg | ORAL_TABLET | Freq: Two times a day (BID) | ORAL | Status: DC

## 2014-04-18 NOTE — Progress Notes (Signed)
Pre-operative clearance on chart from Dr. Jackalyn Lombard office.

## 2014-04-21 ENCOUNTER — Other Ambulatory Visit: Payer: Self-pay | Admitting: Surgical

## 2014-04-22 NOTE — Anesthesia Preprocedure Evaluation (Addendum)
Anesthesia Evaluation  Patient identified by MRN, date of birth, ID band Patient awake    Reviewed: Allergy & Precautions, H&P , NPO status , Patient's Chart, lab work & pertinent test results  Airway Mallampati: II TM Distance: <3 FB Neck ROM: Full  Mouth opening: Limited Mouth Opening  Dental no notable dental hx. (+) Partial Upper, Lower Dentures, Missing, Dental Advisory Given   Pulmonary neg pulmonary ROS, former smoker,  breath sounds clear to auscultation  Pulmonary exam normal       Cardiovascular hypertension, Pt. on medications negative cardio ROS  + dysrhythmias Atrial Fibrillation Rhythm:Regular Rate:Normal     Neuro/Psych negative neurological ROS  negative psych ROS   GI/Hepatic negative GI ROS, Neg liver ROS,   Endo/Other  negative endocrine ROS  Renal/GU negative Renal ROS  negative genitourinary   Musculoskeletal negative musculoskeletal ROS (+)   Abdominal   Peds negative pediatric ROS (+)  Hematology negative hematology ROS (+)   Anesthesia Other Findings Surgery cancelled 4/16 for new onset atrial fibrillation with RVR. Cardiac clearence given.  Reproductive/Obstetrics negative OB ROS                        Anesthesia Physical Anesthesia Plan  ASA: II  Anesthesia Plan: General   Post-op Pain Management:    Induction: Intravenous  Airway Management Planned: Oral ETT  Additional Equipment:   Intra-op Plan:   Post-operative Plan: Extubation in OR  Informed Consent: I have reviewed the patients History and Physical, chart, labs and discussed the procedure including the risks, benefits and alternatives for the proposed anesthesia with the patient or authorized representative who has indicated his/her understanding and acceptance.   Dental advisory given  Plan Discussed with:   Anesthesia Plan Comments:         Anesthesia Quick Evaluation

## 2014-04-23 ENCOUNTER — Encounter (HOSPITAL_COMMUNITY): Payer: Medicare Other | Admitting: Anesthesiology

## 2014-04-23 ENCOUNTER — Encounter (HOSPITAL_COMMUNITY): Payer: Self-pay | Admitting: *Deleted

## 2014-04-23 ENCOUNTER — Observation Stay (HOSPITAL_COMMUNITY)
Admission: RE | Admit: 2014-04-23 | Discharge: 2014-04-24 | Disposition: A | Discharge status: Home Health | Payer: Medicare Other | Source: Ambulatory Visit | Attending: Orthopedic Surgery | Admitting: Orthopedic Surgery

## 2014-04-23 ENCOUNTER — Ambulatory Visit (HOSPITAL_COMMUNITY): Payer: Medicare Other | Admitting: Anesthesiology

## 2014-04-23 ENCOUNTER — Encounter (HOSPITAL_COMMUNITY)
Admission: RE | Disposition: A | Discharge status: Home Health | Payer: Self-pay | Source: Ambulatory Visit | Attending: Orthopedic Surgery

## 2014-04-23 DIAGNOSIS — I10 Essential (primary) hypertension: Secondary | ICD-10-CM | POA: Diagnosis not present

## 2014-04-23 DIAGNOSIS — E785 Hyperlipidemia, unspecified: Secondary | ICD-10-CM | POA: Insufficient documentation

## 2014-04-23 DIAGNOSIS — K5730 Diverticulosis of large intestine without perforation or abscess without bleeding: Secondary | ICD-10-CM | POA: Insufficient documentation

## 2014-04-23 DIAGNOSIS — Z87891 Personal history of nicotine dependence: Secondary | ICD-10-CM | POA: Insufficient documentation

## 2014-04-23 DIAGNOSIS — S43499A Other sprain of unspecified shoulder joint, initial encounter: Secondary | ICD-10-CM | POA: Diagnosis not present

## 2014-04-23 DIAGNOSIS — I4891 Unspecified atrial fibrillation: Secondary | ICD-10-CM | POA: Insufficient documentation

## 2014-04-23 DIAGNOSIS — S43429A Sprain of unspecified rotator cuff capsule, initial encounter: Secondary | ICD-10-CM | POA: Diagnosis not present

## 2014-04-23 DIAGNOSIS — Z85828 Personal history of other malignant neoplasm of skin: Secondary | ICD-10-CM | POA: Insufficient documentation

## 2014-04-23 DIAGNOSIS — I4949 Other premature depolarization: Secondary | ICD-10-CM | POA: Insufficient documentation

## 2014-04-23 DIAGNOSIS — M7580 Other shoulder lesions, unspecified shoulder: Secondary | ICD-10-CM

## 2014-04-23 DIAGNOSIS — M75120 Complete rotator cuff tear or rupture of unspecified shoulder, not specified as traumatic: Secondary | ICD-10-CM | POA: Diagnosis present

## 2014-04-23 DIAGNOSIS — Z79899 Other long term (current) drug therapy: Secondary | ICD-10-CM | POA: Insufficient documentation

## 2014-04-23 DIAGNOSIS — M19019 Primary osteoarthritis, unspecified shoulder: Secondary | ICD-10-CM | POA: Diagnosis not present

## 2014-04-23 DIAGNOSIS — W010XXA Fall on same level from slipping, tripping and stumbling without subsequent striking against object, initial encounter: Secondary | ICD-10-CM | POA: Insufficient documentation

## 2014-04-23 DIAGNOSIS — K219 Gastro-esophageal reflux disease without esophagitis: Secondary | ICD-10-CM | POA: Diagnosis not present

## 2014-04-23 DIAGNOSIS — M25819 Other specified joint disorders, unspecified shoulder: Secondary | ICD-10-CM | POA: Diagnosis not present

## 2014-04-23 DIAGNOSIS — S46819A Strain of other muscles, fascia and tendons at shoulder and upper arm level, unspecified arm, initial encounter: Secondary | ICD-10-CM | POA: Diagnosis not present

## 2014-04-23 HISTORY — PX: SHOULDER OPEN ROTATOR CUFF REPAIR: SHX2407

## 2014-04-23 LAB — PROTIME-INR
INR: 1.01 (ref 0.00–1.49)
Prothrombin Time: 13.1 seconds (ref 11.6–15.2)

## 2014-04-23 LAB — APTT: aPTT: 27 seconds (ref 24–37)

## 2014-04-23 SURGERY — REPAIR, ROTATOR CUFF, OPEN
Anesthesia: General | Site: Shoulder | Laterality: Right

## 2014-04-23 MED ORDER — ONDANSETRON HCL 4 MG/2ML IJ SOLN: 4.0000 mg | INTRAMUSCULAR | Status: DC | PRN

## 2014-04-23 MED ORDER — GLYCOPYRROLATE 0.2 MG/ML IJ SOLN
INTRAMUSCULAR | Status: DC | PRN
  Administered 2014-04-23: 0.2 mg via INTRAVENOUS

## 2014-04-23 MED ORDER — PROMETHAZINE HCL 25 MG/ML IJ SOLN: 6.2500 mg | INTRAMUSCULAR | Status: DC | PRN

## 2014-04-23 MED ORDER — CEFAZOLIN SODIUM 1-5 GM-% IV SOLN
1.0000 g | Freq: Three times a day (TID) | INTRAVENOUS | Status: AC
  Administered 2014-04-23 – 2014-04-24 (×3): 1 g via INTRAVENOUS
  Filled 2014-04-23 (×5): qty 50

## 2014-04-23 MED ORDER — BUPIVACAINE LIPOSOME 1.3 % IJ SUSP
20.0000 mL | Freq: Once | INTRAMUSCULAR | Status: DC
  Filled 2014-04-23: qty 20

## 2014-04-23 MED ORDER — ADULT MULTIVITAMIN W/MINERALS CH
1.0000 | ORAL_TABLET | Freq: Every day | ORAL | Status: DC
  Administered 2014-04-23 – 2014-04-24 (×2): 1 via ORAL
  Filled 2014-04-23 (×2): qty 1

## 2014-04-23 MED ORDER — ACETAMINOPHEN 650 MG RE SUPP: 650.0000 mg | RECTAL | Status: DC | PRN

## 2014-04-23 MED ORDER — HYDROMORPHONE HCL PF 1 MG/ML IJ SOLN
0.5000 mg | INTRAMUSCULAR | Status: DC | PRN
  Administered 2014-04-23: 0.5 mg via INTRAVENOUS
  Filled 2014-04-23: qty 1

## 2014-04-23 MED ORDER — FENTANYL CITRATE 0.05 MG/ML IJ SOLN
INTRAMUSCULAR | Status: AC
  Filled 2014-04-23: qty 5

## 2014-04-23 MED ORDER — EPHEDRINE SULFATE 50 MG/ML IJ SOLN
INTRAMUSCULAR | Status: AC
  Filled 2014-04-23: qty 1

## 2014-04-23 MED ORDER — BISACODYL 5 MG PO TBEC: 5.0000 mg | DELAYED_RELEASE_TABLET | Freq: Every day | ORAL | Status: DC | PRN

## 2014-04-23 MED ORDER — MIDAZOLAM HCL 5 MG/5ML IJ SOLN
INTRAMUSCULAR | Status: DC | PRN
  Administered 2014-04-23: 1 mg via INTRAVENOUS

## 2014-04-23 MED ORDER — ONDANSETRON HCL 4 MG/2ML IJ SOLN
INTRAMUSCULAR | Status: DC | PRN
  Administered 2014-04-23: 4 mg via INTRAVENOUS

## 2014-04-23 MED ORDER — DEXTROSE 5 % IV SOLN
20.0000 mg | INTRAVENOUS | Status: DC | PRN
  Administered 2014-04-23: 10 ug/min via INTRAVENOUS

## 2014-04-23 MED ORDER — LACTATED RINGERS IV SOLN
INTRAVENOUS | Status: DC
  Administered 2014-04-23: 08:00:00 via INTRAVENOUS
  Administered 2014-04-23: 1000 mL via INTRAVENOUS

## 2014-04-23 MED ORDER — ROCURONIUM BROMIDE 100 MG/10ML IV SOLN
INTRAVENOUS | Status: AC
  Filled 2014-04-23: qty 1

## 2014-04-23 MED ORDER — ACETAMINOPHEN 325 MG PO TABS: 650.0000 mg | ORAL_TABLET | ORAL | Status: DC | PRN

## 2014-04-23 MED ORDER — LACTATED RINGERS IV SOLN: INTRAVENOUS | Status: DC

## 2014-04-23 MED ORDER — ONDANSETRON HCL 4 MG/2ML IJ SOLN
INTRAMUSCULAR | Status: AC
  Filled 2014-04-23: qty 2

## 2014-04-23 MED ORDER — HYDROMORPHONE HCL PF 1 MG/ML IJ SOLN
0.2500 mg | INTRAMUSCULAR | Status: DC | PRN
  Administered 2014-04-23: 0.5 mg via INTRAVENOUS

## 2014-04-23 MED ORDER — CEFAZOLIN SODIUM-DEXTROSE 2-3 GM-% IV SOLR
2.0000 g | INTRAVENOUS | Status: AC
  Administered 2014-04-23: 2 g via INTRAVENOUS

## 2014-04-23 MED ORDER — QUINAPRIL HCL 10 MG PO TABS
40.0000 mg | ORAL_TABLET | Freq: Every morning | ORAL | Status: DC
  Filled 2014-04-23: qty 4

## 2014-04-23 MED ORDER — PROPOFOL 10 MG/ML IV BOLUS
INTRAVENOUS | Status: DC | PRN
  Administered 2014-04-23: 160 mg via INTRAVENOUS

## 2014-04-23 MED ORDER — METHOCARBAMOL 1000 MG/10ML IJ SOLN
500.0000 mg | Freq: Four times a day (QID) | INTRAMUSCULAR | Status: DC | PRN
  Administered 2014-04-23: 500 mg via INTRAVENOUS
  Filled 2014-04-23: qty 5

## 2014-04-23 MED ORDER — POLYETHYLENE GLYCOL 3350 17 G PO PACK: 17.0000 g | PACK | Freq: Every day | ORAL | Status: DC | PRN

## 2014-04-23 MED ORDER — METOPROLOL TARTRATE 25 MG PO TABS
25.0000 mg | ORAL_TABLET | Freq: Two times a day (BID) | ORAL | Status: DC
  Administered 2014-04-23 – 2014-04-24 (×2): 25 mg via ORAL
  Filled 2014-04-23 (×3): qty 1

## 2014-04-23 MED ORDER — THROMBIN 5000 UNITS EX SOLR
CUTANEOUS | Status: AC
Start: 2014-04-23 — End: 2014-04-23
  Filled 2014-04-23: qty 5000

## 2014-04-23 MED ORDER — EPHEDRINE SULFATE 50 MG/ML IJ SOLN
INTRAMUSCULAR | Status: DC | PRN
  Administered 2014-04-23 (×2): 5 mg via INTRAVENOUS

## 2014-04-23 MED ORDER — CEFAZOLIN SODIUM-DEXTROSE 2-3 GM-% IV SOLR
INTRAVENOUS | Status: AC
Start: 2014-04-23 — End: 2014-04-23
  Filled 2014-04-23: qty 50

## 2014-04-23 MED ORDER — FLEET ENEMA 7-19 GM/118ML RE ENEM: 1.0000 | ENEMA | Freq: Once | RECTAL | Status: AC | PRN

## 2014-04-23 MED ORDER — SODIUM CHLORIDE 0.9 % IR SOLN
Status: DC | PRN
  Administered 2014-04-23: 10:00:00

## 2014-04-23 MED ORDER — SUCCINYLCHOLINE CHLORIDE 20 MG/ML IJ SOLN
INTRAMUSCULAR | Status: DC | PRN
  Administered 2014-04-23: 100 mg via INTRAVENOUS

## 2014-04-23 MED ORDER — OXYCODONE-ACETAMINOPHEN 5-325 MG PO TABS: 2.0000 | ORAL_TABLET | ORAL | Status: DC | PRN

## 2014-04-23 MED ORDER — LIDOCAINE HCL (CARDIAC) 20 MG/ML IV SOLN
INTRAVENOUS | Status: DC | PRN
  Administered 2014-04-23: 80 mg via INTRAVENOUS

## 2014-04-23 MED ORDER — LIDOCAINE HCL (CARDIAC) 20 MG/ML IV SOLN
INTRAVENOUS | Status: AC
  Filled 2014-04-23: qty 5

## 2014-04-23 MED ORDER — HYDROCODONE-ACETAMINOPHEN 5-325 MG PO TABS
1.0000 | ORAL_TABLET | ORAL | Status: DC | PRN
  Administered 2014-04-23: 1 via ORAL
  Administered 2014-04-23 – 2014-04-24 (×3): 2 via ORAL
  Filled 2014-04-23 (×3): qty 2
  Filled 2014-04-23: qty 1

## 2014-04-23 MED ORDER — THROMBIN 5000 UNITS EX SOLR
CUTANEOUS | Status: DC | PRN
Start: 2014-04-23 — End: 2014-04-23
  Administered 2014-04-23: 5000 [IU] via TOPICAL

## 2014-04-23 MED ORDER — HYDROMORPHONE HCL PF 1 MG/ML IJ SOLN
INTRAMUSCULAR | Status: AC
  Filled 2014-04-23: qty 1

## 2014-04-23 MED ORDER — FENTANYL CITRATE 0.05 MG/ML IJ SOLN
INTRAMUSCULAR | Status: DC | PRN
  Administered 2014-04-23 (×4): 50 ug via INTRAVENOUS

## 2014-04-23 MED ORDER — PROPOFOL 10 MG/ML IV BOLUS
INTRAVENOUS | Status: AC
  Filled 2014-04-23: qty 20

## 2014-04-23 MED ORDER — GLYCOPYRROLATE 0.2 MG/ML IJ SOLN
INTRAMUSCULAR | Status: AC
  Filled 2014-04-23: qty 1

## 2014-04-23 MED ORDER — MIDAZOLAM HCL 2 MG/2ML IJ SOLN
INTRAMUSCULAR | Status: AC
  Filled 2014-04-23: qty 2

## 2014-04-23 MED ORDER — SODIUM CHLORIDE 0.9 % IJ SOLN
INTRAMUSCULAR | Status: AC
Start: 2014-04-23 — End: 2014-04-23
  Filled 2014-04-23: qty 10

## 2014-04-23 MED ORDER — FENTANYL CITRATE 0.05 MG/ML IJ SOLN: 25.0000 ug | INTRAMUSCULAR | Status: DC | PRN

## 2014-04-23 MED ORDER — LISINOPRIL 40 MG PO TABS
40.0000 mg | ORAL_TABLET | Freq: Every day | ORAL | Status: DC
  Administered 2014-04-23 – 2014-04-24 (×2): 40 mg via ORAL
  Filled 2014-04-23 (×2): qty 1

## 2014-04-23 MED ORDER — PHENYLEPHRINE HCL 10 MG/ML IJ SOLN
INTRAMUSCULAR | Status: AC
  Filled 2014-04-23: qty 2

## 2014-04-23 MED ORDER — BUPIVACAINE LIPOSOME 1.3 % IJ SUSP
INTRAMUSCULAR | Status: DC | PRN
  Administered 2014-04-23: 20 mL

## 2014-04-23 MED ORDER — PHENYLEPHRINE HCL 10 MG/ML IJ SOLN
INTRAMUSCULAR | Status: DC | PRN
  Administered 2014-04-23: 80 ug via INTRAVENOUS

## 2014-04-23 MED ORDER — LACTATED RINGERS IV SOLN
INTRAVENOUS | Status: DC
  Administered 2014-04-23 (×2): via INTRAVENOUS

## 2014-04-23 MED ORDER — METHOCARBAMOL 500 MG PO TABS
500.0000 mg | ORAL_TABLET | Freq: Four times a day (QID) | ORAL | Status: DC | PRN
  Administered 2014-04-23 – 2014-04-24 (×2): 500 mg via ORAL
  Filled 2014-04-23 (×2): qty 1

## 2014-04-23 MED ORDER — MEPERIDINE HCL 50 MG/ML IJ SOLN: 6.2500 mg | INTRAMUSCULAR | Status: DC | PRN

## 2014-04-23 MED ORDER — MENTHOL 3 MG MT LOZG
1.0000 | LOZENGE | OROMUCOSAL | Status: DC | PRN
  Filled 2014-04-23 (×2): qty 9

## 2014-04-23 MED ORDER — PHENOL 1.4 % MT LIQD
1.0000 | OROMUCOSAL | Status: DC | PRN
  Filled 2014-04-23: qty 177

## 2014-04-23 MED ORDER — PHENYLEPHRINE 40 MCG/ML (10ML) SYRINGE FOR IV PUSH (FOR BLOOD PRESSURE SUPPORT)
PREFILLED_SYRINGE | INTRAVENOUS | Status: AC
  Filled 2014-04-23: qty 10

## 2014-04-23 SURGICAL SUPPLY — 46 items
ADH SKN CLS APL DERMABOND .7 (GAUZE/BANDAGES/DRESSINGS) ×1
ANCHOR PEEK ALL THREAD (Anchor) ×3 IMPLANT
BAG SPEC THK2 15X12 ZIP CLS (MISCELLANEOUS)
BAG ZIPLOCK 12X15 (MISCELLANEOUS) IMPLANT
BLADE OSCILLATING/SAGITTAL (BLADE) ×3
BLADE SW THK.38XMED LNG THN (BLADE) ×1 IMPLANT
BNDG COHESIVE 6X5 TAN NS LF (GAUZE/BANDAGES/DRESSINGS) ×3 IMPLANT
BUR OVAL CARBIDE 4.0 (BURR) ×3 IMPLANT
CLEANER TIP ELECTROSURG 2X2 (MISCELLANEOUS) ×3 IMPLANT
DERMABOND ADVANCED (GAUZE/BANDAGES/DRESSINGS) ×2
DERMABOND ADVANCED .7 DNX12 (GAUZE/BANDAGES/DRESSINGS) ×1 IMPLANT
DERMASPAN .5-.9MM 4X4CM SHOU (Miscellaneous) ×3 IMPLANT
DRAPE POUCH INSTRU U-SHP 10X18 (DRAPES) ×3 IMPLANT
DRSG AQUACEL AG ADV 3.5X 6 (GAUZE/BANDAGES/DRESSINGS) ×3 IMPLANT
DURAPREP 26ML APPLICATOR (WOUND CARE) ×3 IMPLANT
ELECT REM PT RETURN 9FT ADLT (ELECTROSURGICAL) ×3
ELECTRODE REM PT RTRN 9FT ADLT (ELECTROSURGICAL) ×1 IMPLANT
GLOVE BIOGEL PI IND STRL 6.5 (GLOVE) ×1 IMPLANT
GLOVE BIOGEL PI IND STRL 8 (GLOVE) ×1 IMPLANT
GLOVE BIOGEL PI INDICATOR 6.5 (GLOVE) ×2
GLOVE BIOGEL PI INDICATOR 8 (GLOVE) ×2
GLOVE ECLIPSE 8.0 STRL XLNG CF (GLOVE) ×3 IMPLANT
GLOVE SURG SS PI 6.5 STRL IVOR (GLOVE) ×3 IMPLANT
GOWN STRL REUS W/TWL LRG LVL3 (GOWN DISPOSABLE) ×3 IMPLANT
GOWN STRL REUS W/TWL XL LVL3 (GOWN DISPOSABLE) ×3 IMPLANT
KIT BASIN OR (CUSTOM PROCEDURE TRAY) ×3 IMPLANT
KIT POSITION SHOULDER SCHLEI (MISCELLANEOUS) ×3 IMPLANT
MANIFOLD NEPTUNE II (INSTRUMENTS) ×3 IMPLANT
NEEDLE MA TROC 1/2 (NEEDLE) IMPLANT
NS IRRIG 1000ML POUR BTL (IV SOLUTION) IMPLANT
PACK SHOULDER CUSTOM OPM052 (CUSTOM PROCEDURE TRAY) ×3 IMPLANT
POSITIONER SURGICAL ARM (MISCELLANEOUS) ×3 IMPLANT
SLING ARM IMMOBILIZER LRG (SOFTGOODS) ×6 IMPLANT
SPONGE SURGIFOAM ABS GEL 100 (HEMOSTASIS) ×3 IMPLANT
STAPLER VISISTAT 35W (STAPLE) IMPLANT
SUCTION FRAZIER 12FR DISP (SUCTIONS) ×3 IMPLANT
SUT BONE WAX W31G (SUTURE) ×3 IMPLANT
SUT ETHIBOND NAB CT1 #1 30IN (SUTURE) ×6 IMPLANT
SUT MNCRL AB 4-0 PS2 18 (SUTURE) ×3 IMPLANT
SUT VIC AB 0 CT1 27 (SUTURE)
SUT VIC AB 0 CT1 27XBRD ANTBC (SUTURE) IMPLANT
SUT VIC AB 1 CT1 27 (SUTURE) ×6
SUT VIC AB 1 CT1 27XBRD ANTBC (SUTURE) ×2 IMPLANT
SUT VIC AB 2-0 CT1 27 (SUTURE) ×6
SUT VIC AB 2-0 CT1 27XBRD (SUTURE) ×2 IMPLANT
TOWEL OR 17X26 10 PK STRL BLUE (TOWEL DISPOSABLE) ×3 IMPLANT

## 2014-04-23 NOTE — Transfer of Care (Signed)
Immediate Anesthesia Transfer of Care Note  Patient: Sergio Mack  Procedure(s) Performed: Procedure(s): RIGHT SHOULDER ROTATOR CUFF REPAIR WITH GRAFT AND ANCHORS . OPEN ACROMIONECTOMY (Right)  Patient Location: PACU  Anesthesia Type:General  Level of Consciousness: awake, alert , oriented and patient cooperative  Airway & Oxygen Therapy: Patient Spontanous Breathing and Patient connected to face mask oxygen  Post-op Assessment: Report given to PACU RN, Post -op Vital signs reviewed and stable and Patient moving all extremities  Post vital signs: Reviewed and stable  Complications: No apparent anesthesia complications

## 2014-04-23 NOTE — H&P (Signed)
Sergio Mack is an 68 y.o. male.   Chief Complaint: Pain in Right Shoulder HPI: Chronic pain in Right Shoulder.  Past Medical History  Diagnosis Date  . History of syncope 1994    no etiology; no recurrence  . Hypertension   . Hyperlipidemia     LDL goal = < 100, ideally < 70  . GERD (gastroesophageal reflux disease)   . Erectile dysfunction   . Diverticulosis 2006  . Rotator cuff tear   . History of skin cancer   . PVC's (premature ventricular contractions)   . Anticoagulation adequate, new on eliquis 03/07/2014  . Atrial fibrillation with RVR, new onset, CHA2DS2-VASc score 2- back to SR at discharge 03/06/2014  . Dysrhythmia     Hx A-Fib from 12/2013-new onset- on meds    Past Surgical History  Procedure Laterality Date  . Tonsillectomy and adenoidectomy    . Cystectomy      lip  . Colonoscopy  2006    Diverticulosis  . Spine surgery  2012    Dr Gladstone Lighter  . Cystectomy      anterior thorax  . Inguinal hernia repair       x 2    Family History  Problem Relation Age of Onset  . Heart attack Father 41  . Diabetes Mother   . Transient ischemic attack Mother 82  . Diabetes Sister   . Heart attack Sister 26  . Stomach cancer Maternal Grandfather    Social History:  reports that he quit smoking about 24 years ago. He does not have any smokeless tobacco history on file. He reports that he does not drink alcohol or use illicit drugs.  Allergies:  Allergies  Allergen Reactions  . Shellfish-Derived Products Itching    Itching feet and palms, tightness in chest    Medications Prior to Admission  Medication Sig Dispense Refill  . metoprolol tartrate (LOPRESSOR) 25 MG tablet Take 1 tablet (25 mg total) by mouth 2 (two) times daily.  180 tablet  1  . Omega-3 Fatty Acids (FISH OIL) 1000 MG CAPS Take 2,000 mg by mouth daily.       . quinapril (ACCUPRIL) 40 MG tablet Take 1 tablet (40 mg total) by mouth every morning.  90 tablet  1  . apixaban (ELIQUIS) 5 MG TABS tablet  Take 1 tablet (5 mg total) by mouth 2 (two) times daily.  60 tablet  0  . Cholecalciferol (VITAMIN D3) 2000 UNITS TABS Take 1 capsule by mouth daily.       . Multiple Vitamin (MULTIVITAMIN WITH MINERALS) TABS tablet Take 1 tablet by mouth daily.      . Multiple Vitamins-Minerals (OCUVITE PRESERVISION PO) Take 1 capsule by mouth 2 (two) times daily.       . vitamin B-12 (CYANOCOBALAMIN) 1000 MCG tablet Take 1,000 mcg by mouth daily.         Results for orders placed during the hospital encounter of 04/23/14 (from the past 48 hour(s))  APTT     Status: None   Collection Time    04/23/14  7:15 AM      Result Value Ref Range   aPTT 27  24 - 37 seconds  PROTIME-INR     Status: None   Collection Time    04/23/14  7:15 AM      Result Value Ref Range   Prothrombin Time 13.1  11.6 - 15.2 seconds   INR 1.01  0.00 - 1.49   No results found.  Review of Systems  Constitutional: Negative.   HENT: Negative.   Eyes: Negative.   Respiratory: Negative.   Cardiovascular: Negative.   Gastrointestinal: Negative.   Genitourinary: Negative.   Musculoskeletal: Positive for joint pain.  Skin: Negative.   Neurological: Negative.   Endo/Heme/Allergies: Negative.   Psychiatric/Behavioral: Negative.     Blood pressure 144/69, pulse 77, temperature 97.9 F (36.6 C), temperature source Oral, resp. rate 18, SpO2 100.00%. Physical Exam  Constitutional: He appears well-developed.  HENT:  Head: Normocephalic.  Eyes: Pupils are equal, round, and reactive to light.  Neck: Normal range of motion.  Cardiovascular: Normal rate.   Respiratory: Effort normal.  GI: Soft.  Musculoskeletal:  Painful,limited range of motion Right shoulder.  Neurological: He is alert.  Skin: Skin is warm.     Assessment/Plan Open Repair of Rotator cuff,right shoulder.  Kipp Brood Mylo Choi 04/23/2014, 7:48 AM

## 2014-04-23 NOTE — Anesthesia Postprocedure Evaluation (Signed)
Anesthesia Post Note  Patient: Sergio Mack  Procedure(s) Performed: Procedure(s) (LRB): RIGHT SHOULDER ROTATOR CUFF REPAIR WITH GRAFT AND ANCHORS . OPEN ACROMIONECTOMY (Right)  Anesthesia type: General  Patient location: PACU  Post pain: Pain level controlled  Post assessment: Post-op Vital signs reviewed  Last Vitals:  Filed Vitals:   04/23/14 1338  BP: 143/81  Pulse: 63  Temp: 36.3 C  Resp: 16    Post vital signs: Reviewed  Level of consciousness: sedated  Complications: No apparent anesthesia complications

## 2014-04-23 NOTE — Brief Op Note (Signed)
04/23/2014  9:54 AM  PATIENT:  Sergio Mack  68 y.o. male  PRE-OPERATIVE DIAGNOSIS:  RIGHT SHOULDER ROTATOR CUFF TEAR,Very Complex  POST-OPERATIVE DIAGNOSIS:  right rotator cuff tear,Very Complex  PROCEDURE:  Procedure(s): RIGHT SHOULDER ROTATOR CUFF REPAIR WITH GRAFT AND ANCHORS . OPEN ACROMIONECTOMY (Right),Complex.  SURGEON:  Surgeon(s) and Role:    * Tobi Bastos, MD - Primary  PHYSICIAN ASSISTANT: Ardeen Jourdain PA  ASSISTANTS:Amber Complex PA  ANESTHESIA:   general  EBL:  Total I/O In: -  Out: 25 [Blood:25]  BLOOD ADMINISTERED:none  DRAINS: none   LOCAL MEDICATIONS USED:  BUPIVICAINE 20cc  SPECIMEN:  No Specimen  DISPOSITION OF SPECIMEN:  N/A  COUNTS:  YES  TOURNIQUET:  * No tourniquets in log *  DICTATION: .Other Dictation: Dictation Number [id number]  PLAN OF CARE: Admit for overnight observation  PATIENT DISPOSITION:  PACU - hemodynamically stable.   Delay start of Pharmacological VTE agent (>24hrs) due to surgical blood loss or risk of bleeding: yes

## 2014-04-23 NOTE — Interval H&P Note (Signed)
History and Physical Interval Note:  04/23/2014 7:55 AM  Sergio Mack  has presented today for surgery, with the diagnosis of RIGHT SHOULDER ROTATOR CUFF TEAR   The various methods of treatment have been discussed with the patient and family. After consideration of risks, benefits and other options for treatment, the patient has consented to  Procedure(s): RIGHT SHOULDER ROTATOR CUFF REPAIR WITH GRAFT AND ANCHORS  (Right) as a surgical intervention .  The patient's history has been reviewed, patient examined, no change in status, stable for surgery.  I have reviewed the patient's chart and labs.  Questions were answered to the patient's satisfaction.     Tobi Bastos

## 2014-04-24 ENCOUNTER — Encounter (HOSPITAL_COMMUNITY): Payer: Self-pay | Admitting: Orthopedic Surgery

## 2014-04-24 MED ORDER — OXYCODONE-ACETAMINOPHEN 5-325 MG PO TABS: 1.0000 | ORAL_TABLET | ORAL | Status: DC | PRN

## 2014-04-24 MED ORDER — POLYETHYLENE GLYCOL 3350 17 G PO PACK: 17.0000 g | PACK | Freq: Every day | ORAL | Status: DC | PRN

## 2014-04-24 MED ORDER — METHOCARBAMOL 500 MG PO TABS: 500.0000 mg | ORAL_TABLET | Freq: Four times a day (QID) | ORAL | Status: DC | PRN

## 2014-04-24 NOTE — Progress Notes (Signed)
Subjective: 1 Day Post-Op Procedure(s) (LRB): RIGHT SHOULDER ROTATOR CUFF REPAIR WITH GRAFT AND ANCHORS . OPEN ACROMIONECTOMY (Right) Patient reports pain as 3 on 0-10 scale.  Doing Well this morning. Plan to Dc after seen by OT.  Objective: Vital signs in last 24 hours: Temp:  [97 F (36.1 C)-99.1 F (37.3 C)] 98.3 F (36.8 C) (06/04 0500) Pulse Rate:  [60-84] 76 (06/04 0500) Resp:  [10-20] 16 (06/04 0500) BP: (107-157)/(64-91) 119/70 mmHg (06/04 0500) SpO2:  [96 %-100 %] 97 % (06/04 0500) Weight:  [88.536 kg (195 lb 3 oz)] 88.536 kg (195 lb 3 oz) (06/03 2300)  Intake/Output from previous day: 06/03 0701 - 06/04 0700 In: 2814 [P.O.:684; I.V.:2130] Out: 1925 [Urine:1900; Blood:25] Intake/Output this shift:    No results found for this basename: HGB,  in the last 72 hours No results found for this basename: WBC, RBC, HCT, PLT,  in the last 72 hours No results found for this basename: NA, K, CL, CO2, BUN, CREATININE, GLUCOSE, CALCIUM,  in the last 72 hours  Recent Labs  04/23/14 0715  INR 1.01    Neurovascular intact  Assessment/Plan: 1 Day Post-Op Procedure(s) (LRB): RIGHT SHOULDER ROTATOR CUFF REPAIR WITH GRAFT AND ANCHORS . OPEN ACROMIONECTOMY (Right) Discharge home with home health  Sergio Mack 04/24/2014, 7:14 AM

## 2014-04-24 NOTE — Progress Notes (Signed)
Occupational Therapy Evaluation Patient Details Name: Sergio Mack MRN: [id number] DOB: February 19, 1946 Today's Date: 04/24/2014    History of Present Illness Pt is s/p RIGHT SHOULDER ROTATOR CUFF REPAIR WITH GRAFT AND ANCHORS . OPEN ACROMIONECTOMY    Clinical Impression   Pt doing well. All education completed with pt and wife. No further acute OT needs.    Follow Up Recommendations  No OT follow up --progress shoulder as MD advises.   Equipment Recommendations  None recommended by OT    Recommendations for Other Services       Precautions / Restrictions Precautions Precautions: Shoulder Shoulder Interventions: Shoulder sling/immobilizer;Off for dressing/bathing/exercises Precaution Booklet Issued: Yes (comment) Precaution Comments: Issued shoulder care handout adn reviewed all information with pt and wife. Required Braces or Orthoses: Sling Restrictions Weight Bearing Restrictions: Yes RUE Weight Bearing: Non weight bearing      Mobility Bed Mobility                  Transfers                      Balance                                            ADL                                               Vision                     Perception     Praxis      Pertinent Vitals/Pain 2/10 R shoulder; reposition     Hand Dominance     Extremity/Trunk Assessment             Communication     Cognition Arousal/Alertness: Awake/alert Behavior During Therapy: WFL for tasks assessed/performed Overall Cognitive Status: Within Functional Limits for tasks assessed                     General Comments       Exercises   Other Exercises Other Exercises: elbow, wrist and hand flexion/extension X 10. kept elbow tucked at side in sitting and brought hand to mouth and back down to lap.   Shoulder Instructions Shoulder Instructions Donning/doffing shirt without moving shoulder: Caregiver  independent with task Method for sponge bathing under operated UE: Supervision/safety Donning/doffing sling/immobilizer: Caregiver independent with task Correct positioning of sling/immobilizer: Caregiver independent with task ROM for elbow, wrist and digits of operated UE: Supervision/safety Sling wearing schedule (on at all times/off for ADL's): Caregiver independent with task;Independent Proper positioning of operated UE when showering: Independent;Caregiver independent with task Positioning of UE while sleeping: Independent;Caregiver independent with task    Home Living Family/patient expects to be discharged to:: Private residence Living Arrangements: Spouse/significant other                     [place]: Handicapped height                Prior Functioning/Environment               OT Diagnosis:     OT Problem List:     OT Treatment/Interventions:  OT Goals(Current goals can be found in the care plan section) Acute Rehab OT Goals Patient Stated Goal: home  OT Frequency:     Barriers to D/C:            Co-evaluation              End of Session    Activity Tolerance: Patient tolerated treatment well Patient left: in bed;with call bell/phone within reach;with family/visitor present   Time: 1275-1700 OT Time Calculation (min): 36 min Charges:  OT General Charges $OT Visit: 1 Procedure OT Evaluation $Initial OT Evaluation Tier I: 1 Procedure OT Treatments $Self Care/Home Management : 8-22 mins $Therapeutic Exercise: 8-22 mins G-Codes: OT G-codes **NOT FOR INPATIENT CLASS** Functional Assessment Tool Used: clinical judgement Functional Limitation: Self care Self Care Current Status (F7494): At least 20 percent but less than 40 percent impaired, limited or restricted Self Care Goal Status (W9675): At least 20 percent but less than 40 percent impaired, limited or restricted Self Care Discharge Status [phone]): At least 20 percent  but less than 40 percent impaired, limited or restricted  Jules Schick [phone] 04/24/2014, 9:31 AM

## 2014-04-24 NOTE — Op Note (Signed)
NAMEGERMAIN, KOOPMANN NO.:  [id number]  MEDICAL RECORD NO.:  [id number]  LOCATION:  38                         FACILITY:  [facility]  PHYSICIAN:  Kipp Brood. Laszlo Ellerby, M.D.DATE OF BIRTH:  February 16, 1946  DATE OF PROCEDURE:  04/23/2014 DATE OF DISCHARGE:                              OPERATIVE REPORT   SURGEON:  Kipp Brood. Gladstone Lighter, M.D.  OPERATIVE ASSISTANT:  Ardeen Jourdain, P.A.  PREOPERATIVE DIAGNOSES: 1. Severe complex tear with retraction of the right rotator cuff. 2. Severe impingement syndrome, all right shoulder.  POSTOPERATIVE DIAGNOSES: 1. Severe complex tear with retraction of the right rotator cuff. 2. Severe impingement syndrome, all right shoulder.  OPERATIONS: 1. Open acromionectomy and acromioplasty, right shoulder. 2. Repair of an extremely complex tear of the rotator cuff. 3. Application of a DermaSpan graft with one anchor over six sutures.  DESCRIPTION OF PROCEDURE:  Under general anesthesia, routine orthopedic prepping and draping of the right upper extremity was carried out.  The patient in the beach chair position.  He had 2 g of IV Ancef. Appropriate time-out was first carried out.  I also marked the appropriate right arm in the holding area.  At this time, incision was made over the acromion in usual fashion proximally and extended distally.  At this point, the deltoid tendon was stripped from the acromion in the usual fashion.  I split the superior part of the deltoid muscle, inserted the retractors, and identified a complete retracted tear medially at the rotator cuff.  The remaining part of the cup was severely degenerated and there really was not much cup to work with.  I utilized the burr after I did the partial acromionectomy.  I utilized the burr even up the undersurface of the acromion, thoroughly irrigated out the area.  I then burred the lateral articular surface of the humerus.  I went medially and brought the tendon as well as the  biceps laterally and sutured in place.  After that, we then applied the DermaSpan graft to the area.  Thoroughly irrigated out the area and then bone waxed the undersurface of the acromion.  I inserted some thrombin-soaked Gelfoam.  Reapproximated deltoid tendon muscle in usual fashion.  Sterile dressings were applied and the patient was then placed in a shoulder immobilizer.  Also, prior to closing, we injected 20 mL of Exparel.          ______________________________ Kipp Brood. Gladstone Lighter, M.D.     RAG/MEDQ  D:  04/23/2014  T:  04/24/2014  Job:  [id number]

## 2014-04-24 NOTE — Discharge Instructions (Signed)
Keep your sling on at all times, including sleeping in your sling. The only time you should remove your sling is to shower only but you need to keep your hand against your chest while you shower.  Keep the dressing on the wound for two weeks until we remove it in the office.  Call Dr. Gladstone Lighter if any wound complications or temperature of 101 degrees F or over.  Call the office for an appointment to see Dr. Gladstone Lighter in two weeks: [phone] and ask for Dr. Charlestine Night nurse, Brunilda Payor.
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
## 2014-05-12 ENCOUNTER — Other Ambulatory Visit (INDEPENDENT_AMBULATORY_CARE_PROVIDER_SITE_OTHER): Payer: Medicare Other

## 2014-05-12 DIAGNOSIS — I4891 Unspecified atrial fibrillation: Secondary | ICD-10-CM | POA: Diagnosis not present

## 2014-05-12 LAB — CBC
HCT: 39.6 % (ref 39.0–52.0)
Hemoglobin: 13 g/dL (ref 13.0–17.0)
MCHC: 32.9 g/dL (ref 30.0–36.0)
MCV: 85.7 fl (ref 78.0–100.0)
PLATELETS: 365 10*3/uL (ref 150.0–400.0)
RBC: 4.62 Mil/uL (ref 4.22–5.81)
RDW: 13.9 % (ref 11.5–15.5)
WBC: 11.6 10*3/uL — AB (ref 4.0–10.5)

## 2014-05-20 DIAGNOSIS — Z4789 Encounter for other orthopedic aftercare: Secondary | ICD-10-CM | POA: Diagnosis not present

## 2014-06-05 DIAGNOSIS — H905 Unspecified sensorineural hearing loss: Secondary | ICD-10-CM | POA: Diagnosis not present

## 2014-06-18 DIAGNOSIS — M25519 Pain in unspecified shoulder: Secondary | ICD-10-CM | POA: Diagnosis not present

## 2014-06-23 DIAGNOSIS — M25519 Pain in unspecified shoulder: Secondary | ICD-10-CM | POA: Diagnosis not present

## 2014-06-25 DIAGNOSIS — M25519 Pain in unspecified shoulder: Secondary | ICD-10-CM | POA: Diagnosis not present

## 2014-06-25 DIAGNOSIS — H353190 Nonexudative age-related macular degeneration, unspecified eye, stage unspecified: Secondary | ICD-10-CM | POA: Diagnosis not present

## 2014-06-30 DIAGNOSIS — N529 Male erectile dysfunction, unspecified: Secondary | ICD-10-CM | POA: Diagnosis not present

## 2014-06-30 DIAGNOSIS — N401 Enlarged prostate with lower urinary tract symptoms: Secondary | ICD-10-CM | POA: Diagnosis not present

## 2014-06-30 DIAGNOSIS — N139 Obstructive and reflux uropathy, unspecified: Secondary | ICD-10-CM | POA: Diagnosis not present

## 2014-07-01 DIAGNOSIS — M25519 Pain in unspecified shoulder: Secondary | ICD-10-CM | POA: Diagnosis not present

## 2014-07-03 DIAGNOSIS — M25519 Pain in unspecified shoulder: Secondary | ICD-10-CM | POA: Diagnosis not present

## 2014-07-07 DIAGNOSIS — M25519 Pain in unspecified shoulder: Secondary | ICD-10-CM | POA: Diagnosis not present

## 2014-07-09 DIAGNOSIS — M25519 Pain in unspecified shoulder: Secondary | ICD-10-CM | POA: Diagnosis not present

## 2014-07-14 ENCOUNTER — Ambulatory Visit (INDEPENDENT_AMBULATORY_CARE_PROVIDER_SITE_OTHER): Payer: Medicare Other | Admitting: Internal Medicine

## 2014-07-14 ENCOUNTER — Encounter: Payer: Self-pay | Admitting: Internal Medicine

## 2014-07-14 VITALS — BP 119/79 | HR 68 | Ht 72.0 in | Wt 193.8 lb

## 2014-07-14 DIAGNOSIS — I10 Essential (primary) hypertension: Secondary | ICD-10-CM | POA: Diagnosis not present

## 2014-07-14 DIAGNOSIS — I4891 Unspecified atrial fibrillation: Secondary | ICD-10-CM

## 2014-07-14 MED ORDER — QUINAPRIL HCL 40 MG PO TABS: 40.0000 mg | ORAL_TABLET | Freq: Every morning | ORAL | Status: DC

## 2014-07-14 MED ORDER — METOPROLOL TARTRATE 25 MG PO TABS: 25.0000 mg | ORAL_TABLET | Freq: Two times a day (BID) | ORAL | Status: DC

## 2014-07-14 MED ORDER — APIXABAN 5 MG PO TABS: 5.0000 mg | ORAL_TABLET | Freq: Two times a day (BID) | ORAL | Status: DC

## 2014-07-14 NOTE — Patient Instructions (Signed)
Your physician wants you to follow-up in: 12 months with Roderic Palau, NP in afib clinic You will receive a reminder letter in the mail two months in advance. If you don't receive a letter, please call our office to schedule the follow-up appointment.

## 2014-07-14 NOTE — Progress Notes (Signed)
PCP:  Unice Cobble, MD  The patient presents today for electrophysiology followup.  He was seen in May after a brief episode  of afib around time of shoulder surgery, from which he spontaneously converted to NSR.  Was started on  eliquis and metoprolol without further problems. He went on to have shoulder surgery without issues. He plans to travel to [place] within the week and stay in [place] for one month. He ad a recent echo and stress test at time of surgery with normal EF and low risk stress test.  Today, he denies symptoms of palpitations, chest pain, shortness of breath, orthopnea, PND, lower extremity edema, dizziness, presyncope, syncope, or neurologic sequela.  The patient feels that he is tolerating medications without difficulties and is otherwise without complaint today.     Past Medical History  Diagnosis Date  . History of syncope 1994    no etiology; no recurrence  . Hypertension   . Hyperlipidemia     LDL goal = < 100, ideally < 70  . GERD (gastroesophageal reflux disease)   . Erectile dysfunction   . Diverticulosis 2006  . Rotator cuff tear   . History of skin cancer   . PVC's (premature ventricular contractions)   . Anticoagulation adequate, new on eliquis 03/07/2014  . Atrial fibrillation with RVR, new onset, CHA2DS2-VASc score 2- back to SR at discharge 03/06/2014  . Dysrhythmia     Hx A-Fib from 12/2013-new onset- on meds   Past Surgical History  Procedure Laterality Date  . Tonsillectomy and adenoidectomy    . Cystectomy      lip  . Colonoscopy  2006    Diverticulosis  . Spine surgery  2012    Dr Gladstone Lighter  . Cystectomy      anterior thorax  . Inguinal hernia repair       x 2  . Shoulder open rotator cuff repair Right 04/23/2014    Procedure: RIGHT SHOULDER ROTATOR CUFF REPAIR WITH GRAFT AND ANCHORS . OPEN ACROMIONECTOMY;  Surgeon: Tobi Bastos, MD;  Location: [facility];  Service: Orthopedics;  Laterality: Right;    Current Outpatient Prescriptions    Medication Sig Dispense Refill  . apixaban (ELIQUIS) 5 MG TABS tablet Take 1 tablet (5 mg total) by mouth 2 (two) times daily.  60 tablet  11  . apixaban (ELIQUIS) 5 MG TABS tablet Take 1 tablet (5 mg total) by mouth 2 (two) times daily.  60 tablet  11  . Cholecalciferol (VITAMIN D3) 2000 UNITS TABS Take 1 capsule by mouth daily.       . metoprolol tartrate (LOPRESSOR) 25 MG tablet Take 1 tablet (25 mg total) by mouth 2 (two) times daily.  180 tablet  3  . Multiple Vitamin (MULTIVITAMIN WITH MINERALS) TABS tablet Take 1 tablet by mouth daily.      . Multiple Vitamins-Minerals (OCUVITE PRESERVISION PO) Take 1 capsule by mouth 2 (two) times daily.       . Omega-3 Fatty Acids (FISH OIL) 1000 MG CAPS Take 2,000 mg by mouth daily.       . quinapril (ACCUPRIL) 40 MG tablet Take 1 tablet (40 mg total) by mouth every morning.  90 tablet  3  . vitamin B-12 (CYANOCOBALAMIN) 1000 MCG tablet Take 1,000 mcg by mouth daily.        No current facility-administered medications for this visit.    Allergies  Allergen Reactions  . Shellfish-Derived Products Itching    Itching feet and palms, tightness in  chest    History   Social History  . Marital Status: Married    Spouse Name: N/A    Number of Children: N/A  . Years of Education: N/A   Occupational History  . Not on file.   Social History Main Topics  . Smoking status: Former Smoker    Quit date: 04/26/1990  . Smokeless tobacco: Not on file     Comment: smoked [place] with 12 year abstinence ( 21 total years of smoking), up to 1 ppd  . Alcohol Use: No  . Drug Use: No  . Sexual Activity: Not on file   Other Topics Concern  . Not on file   Social History Narrative  . No narrative on file    Family History  Problem Relation Age of Onset  . Heart attack Father 93  . Diabetes Mother   . Transient ischemic attack Mother 25  . Diabetes Sister   . Heart attack Sister 90  . Stomach cancer Maternal Grandfather     ROS-  All  systems are reviewed and are negative except as outlined in the HPI above  Physical Exam: Filed Vitals:   07/14/14 1218  BP: 119/79  Pulse: 68  Height: 6' (1.829 m)  Weight: 87.907 kg (193 lb 12.8 oz)    GEN- The patient is well appearing, alert and oriented x 3 today.   Head- normocephalic, atraumatic Eyes-  Sclera clear, conjunctiva pink Ears- hearing intact Oropharynx- clear Neck- supple, no JVP Lymph- no cervical lymphadenopathy Lungs- Clear to ausculation bilaterally, normal work of breathing Heart- Regular rate and rhythm, no murmurs, rubs or gallops, PMI not laterally displaced GI- soft, NT, ND, + BS Extremities- no clubbing, cyanosis, or edema MS- no significant deformity or atrophy Skin- no rash or lesion Psych- euthymic mood, full affect Neuro- strength and sensation are intact  Ekg- Normal sinus rhythm with one PAC noted.   Assessment and Plan:  1. Paroxysmal atrial fibrillation Maintaining NSR Continue metoprolol.  2. Chadsvasc score of at least 2. Continue eliquis. Creatinine 0.87 04/01/14.  3. HTN Stable No change required today  Return 1 year.

## 2014-09-09 DIAGNOSIS — Z4789 Encounter for other orthopedic aftercare: Secondary | ICD-10-CM | POA: Diagnosis not present

## 2014-09-09 DIAGNOSIS — M25511 Pain in right shoulder: Secondary | ICD-10-CM | POA: Diagnosis not present

## 2014-09-12 DIAGNOSIS — Z23 Encounter for immunization: Secondary | ICD-10-CM | POA: Diagnosis not present

## 2014-09-17 DIAGNOSIS — N5201 Erectile dysfunction due to arterial insufficiency: Secondary | ICD-10-CM | POA: Diagnosis not present

## 2014-10-22 ENCOUNTER — Telehealth: Payer: Self-pay | Admitting: Internal Medicine

## 2014-10-22 NOTE — Telephone Encounter (Signed)
Left message for patient's wife that the medication is usually held 24-48 hours prior to procedure and restarted at the discretion of the MD doing the procedure.  I would get the final approval from Dr. Rayann Heman on Friday and get back with her then

## 2014-10-22 NOTE — Telephone Encounter (Signed)
New message       Pt's wife calling stating pt is to have a tooth pulled done at Dr. Kasandra Knudsen at [facility] on 10/26/14 and needs to know when he can stop his Eloquis. Please call back and advise. Pt's wife states you can leave a detailed message.

## 2014-10-23 NOTE — Telephone Encounter (Signed)
Would hold 24 hours prior to the procedure and resume 24 hours afterwards.

## 2014-10-24 NOTE — Telephone Encounter (Signed)
Spoke with with patient he is aware of POC

## 2014-11-20 ENCOUNTER — Other Ambulatory Visit: Payer: Self-pay

## 2014-11-20 ENCOUNTER — Other Ambulatory Visit: Payer: Self-pay | Admitting: Cardiology

## 2014-11-20 MED ORDER — APIXABAN 5 MG PO TABS: 5.0000 mg | ORAL_TABLET | Freq: Two times a day (BID) | ORAL | Status: DC

## 2014-11-20 NOTE — Telephone Encounter (Signed)
E sent to pharm 

## 2014-12-18 DIAGNOSIS — L570 Actinic keratosis: Secondary | ICD-10-CM | POA: Diagnosis not present

## 2014-12-18 DIAGNOSIS — L814 Other melanin hyperpigmentation: Secondary | ICD-10-CM | POA: Diagnosis not present

## 2014-12-18 DIAGNOSIS — D1801 Hemangioma of skin and subcutaneous tissue: Secondary | ICD-10-CM | POA: Diagnosis not present

## 2014-12-18 DIAGNOSIS — L821 Other seborrheic keratosis: Secondary | ICD-10-CM | POA: Diagnosis not present

## 2015-04-02 ENCOUNTER — Other Ambulatory Visit (INDEPENDENT_AMBULATORY_CARE_PROVIDER_SITE_OTHER): Payer: Medicare Other

## 2015-04-02 ENCOUNTER — Other Ambulatory Visit: Payer: Self-pay | Admitting: Internal Medicine

## 2015-04-02 ENCOUNTER — Ambulatory Visit (INDEPENDENT_AMBULATORY_CARE_PROVIDER_SITE_OTHER): Payer: Medicare Other | Admitting: Internal Medicine

## 2015-04-02 ENCOUNTER — Encounter: Payer: Self-pay | Admitting: Internal Medicine

## 2015-04-02 VITALS — BP 142/92 | HR 61 | Temp 98.4°F | Resp 16 | Ht 72.0 in | Wt 203.5 lb

## 2015-04-02 DIAGNOSIS — I10 Essential (primary) hypertension: Secondary | ICD-10-CM

## 2015-04-02 DIAGNOSIS — R739 Hyperglycemia, unspecified: Secondary | ICD-10-CM

## 2015-04-02 DIAGNOSIS — Z23 Encounter for immunization: Secondary | ICD-10-CM

## 2015-04-02 DIAGNOSIS — E782 Mixed hyperlipidemia: Secondary | ICD-10-CM | POA: Diagnosis not present

## 2015-04-02 DIAGNOSIS — Z1211 Encounter for screening for malignant neoplasm of colon: Secondary | ICD-10-CM

## 2015-04-02 LAB — HEPATIC FUNCTION PANEL
ALBUMIN: 4.1 g/dL (ref 3.5–5.2)
ALK PHOS: 82 U/L (ref 39–117)
ALT: 20 U/L (ref 0–53)
AST: 23 U/L (ref 0–37)
Bilirubin, Direct: 0.1 mg/dL (ref 0.0–0.3)
Total Bilirubin: 0.6 mg/dL (ref 0.2–1.2)
Total Protein: 7.4 g/dL (ref 6.0–8.3)

## 2015-04-02 LAB — BASIC METABOLIC PANEL
BUN: 7 mg/dL (ref 6–23)
CALCIUM: 9.8 mg/dL (ref 8.4–10.5)
CO2: 29 mEq/L (ref 19–32)
CREATININE: 0.94 mg/dL (ref 0.40–1.50)
Chloride: 95 mEq/L — ABNORMAL LOW (ref 96–112)
GFR: 84.43 mL/min (ref >60.00)
Glucose, Bld: 96 mg/dL (ref 70–99)
Potassium: 4.8 mEq/L (ref 3.5–5.1)
Sodium: 129 mEq/L — ABNORMAL LOW (ref 135–145)

## 2015-04-02 LAB — TSH: TSH: 1.2 u[IU]/mL (ref 0.35–4.50)

## 2015-04-02 LAB — HEMOGLOBIN A1C: Hgb A1c MFr Bld: 5.9 % (ref 4.6–6.5)

## 2015-04-02 NOTE — Progress Notes (Signed)
Pre visit review using our clinic review tool, if applicable. No additional management support is needed unless otherwise documented below in the visit note. 

## 2015-04-02 NOTE — Assessment & Plan Note (Signed)
Blood pressure goals reviewed. BMET 

## 2015-04-02 NOTE — Assessment & Plan Note (Signed)
A1c

## 2015-04-02 NOTE — Progress Notes (Signed)
   Subjective:    Patient ID: Sergio Mack, male    DOB: 13-Aug-1946, 69 y.o.   MRN: [id number]  HPI The patient is here to assess status of active health conditions.  PMH, FH, & Social History reviewed & updated.  He has been compliant with his medications without adverse effects. He does restrict salt. He has not been monitoring his blood pressure. He walks 1-2 miles per day up to 4 days a week. He has no active cardiopulmonary symptoms. He does describe occasional leg cramps.  He also has nocturia 3-4 times nightly which he relates to increased amounts of fluid intake.  Colonoscopy is due; he has no active GI symptoms.    Review of Systems  Chest pain, palpitations, tachycardia, exertional dyspnea, paroxysmal nocturnal dyspnea, claudication or edema are absent.  Unexplained weight loss, abdominal pain, significant dyspepsia, dysphagia, melena, rectal bleeding, or persistently small caliber stools are denied.  Dysuria, pyuria, hematuria, frequency, or polyuria are denied.    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 There is blanching of the plethora of the hands with pressure. The skin appears clinically tight over the hands without ischemic change.  Pattern alopecia is present.  He has bilateral ptosis. He wears hearing aids bilaterally.  He has upper and lower partials.  Abdomen is protuberant with both ventral and umbilical hernias.  There is slight decrease in the posterior tibial pulses but all pulses are equal.  General appearance :adequately nourished; in no distress. Eyes: No conjunctival inflammation or scleral icterus is present. Oral exam:  Lips and gums are healthy appearing.There is no oropharyngeal erythema or exudate noted.  Heart:  Normal rate and regular rhythm. S1 and S2 normal without gallop, murmur, click, rub or other extra sounds   Lungs:Chest clear to auscultation; no  wheezes, rhonchi,rales ,or rubs present.No increased work of breathing.  Abdomen: bowel sounds normal, soft and non-tender without masses, or organomegaly noted.  No guarding or rebound.  Vascular : all pulses equal ; no bruits present. Skin:Warm & dry.  Intact without suspicious lesions or rashes ; no tenting or jaundice  Lymphatic: No lymphadenopathy is noted about the head, neck, axilla Neuro: Strength, tone normal. DTRs 0-1/2+ @ knees.         Assessment & Plan:  See Current Assessment & Plan in Problem List under specific Diagnosis

## 2015-04-02 NOTE — Patient Instructions (Signed)
Minimal Blood Pressure Goal= AVERAGE < 140/90;  Ideal is an AVERAGE < 135/85. This AVERAGE should be calculated from @ least 5-7 BP readings taken @ different times of day on different days of week. You should not respond to isolated BP readings , but rather the AVERAGE for that week .Please bring your  blood pressure cuff to office visits to verify that it is reliable.It  can also be checked against the blood pressure device at the pharmacy. Finger or wrist cuffs are not dependable; an arm cuff is. Your next office appointment will be determined based upon review of your pending labs . Those instructions will be transmitted to you by My Chart Critical results will be called.  Followup as needed for any active or acute issue. Please report any significant change in your symptoms. 

## 2015-04-02 NOTE — Assessment & Plan Note (Signed)
NMR Lipoprofile

## 2015-04-03 ENCOUNTER — Encounter: Payer: Self-pay | Admitting: Gastroenterology

## 2015-04-04 LAB — NMR LIPOPROFILE WITH LIPIDS
Cholesterol, Total: 127 mg/dL (ref 100–199)
HDL Particle Number: 26.7 umol/L — ABNORMAL LOW (ref >=30.5)
HDL Size: 8.7 nm — ABNORMAL LOW (ref >=9.2)
HDL-C: 40 mg/dL (ref >39)
LDL (calc): 75 mg/dL (ref 0–99)
LDL Particle Number: 1005 nmol/L — ABNORMAL HIGH (ref <1000)
LDL SIZE: 21.2 nm (ref >=20.8)
LP-IR SCORE: 37 (ref <=45)
Large HDL-P: 1.9 umol/L — ABNORMAL LOW (ref >=4.8)
Large VLDL-P: 0.8 nmol/L (ref <=2.7)
SMALL LDL PARTICLE NUMBER: 328 nmol/L (ref <=527)
TRIGLYCERIDES: 62 mg/dL (ref 0–149)
VLDL Size: 41.2 nm (ref <=46.6)

## 2015-05-18 ENCOUNTER — Telehealth: Payer: Self-pay | Admitting: Internal Medicine

## 2015-05-18 NOTE — Telephone Encounter (Signed)
New Message  Pt calling to speak w/ RN about AF clinic. Pt was told in 2015 that pt would be getting letter about AF clinic and following up there. Per pt- not heard anything and requested to speak w;/ RN. Pt was given nect avail [facility] appt just in case. Please call back and discuss.

## 2015-05-20 NOTE — Telephone Encounter (Signed)
Lm for pt with information regarding afib clinic. Looks like appt already set up with dr. Rayann Heman for yearly follow up which was the follow up recommended by dr. Rayann Heman in last office note.  afib clinic was probably mentioned to patient when he saw dr. Rayann Heman but would only need if issues with afib before yearly follow up

## 2015-06-03 ENCOUNTER — Other Ambulatory Visit: Payer: Self-pay

## 2015-06-03 ENCOUNTER — Ambulatory Visit (HOSPITAL_COMMUNITY)
Admission: RE | Admit: 2015-06-03 | Discharge: 2015-06-03 | Disposition: A | Discharge status: Home / Self Care | Payer: Medicare Other | Source: Ambulatory Visit | Attending: Nurse Practitioner | Admitting: Nurse Practitioner

## 2015-06-03 ENCOUNTER — Encounter (HOSPITAL_COMMUNITY): Payer: Self-pay | Admitting: Nurse Practitioner

## 2015-06-03 VITALS — BP 130/78 | HR 63 | Ht 72.0 in | Wt 203.1 lb

## 2015-06-03 DIAGNOSIS — I10 Essential (primary) hypertension: Secondary | ICD-10-CM

## 2015-06-03 DIAGNOSIS — I480 Paroxysmal atrial fibrillation: Secondary | ICD-10-CM | POA: Diagnosis not present

## 2015-06-03 MED ORDER — METOPROLOL TARTRATE 25 MG PO TABS: 25.0000 mg | ORAL_TABLET | Freq: Two times a day (BID) | ORAL | Status: DC

## 2015-06-03 MED ORDER — APIXABAN 5 MG PO TABS: 5.0000 mg | ORAL_TABLET | Freq: Two times a day (BID) | ORAL | Status: DC

## 2015-06-03 MED ORDER — QUINAPRIL HCL 40 MG PO TABS: 40.0000 mg | ORAL_TABLET | Freq: Every morning | ORAL | Status: DC

## 2015-06-03 NOTE — Progress Notes (Signed)
Patient ID: Sergio Mack, male   DOB: September 30, 1946, 69 y.o.   MRN: [id number]      PCP:  Unice Cobble, MD  The patient presents today for electrophysiology followup.  He was seen in May after a brief episode  of afib around time of shoulder surgery, from which he spontaneously converted to NSR.  Was started on  eliquis and metoprolol without further problems. He ad a recent echo and stress test at time of surgery with normal EF and low risk stress test.  He returns for one year visit. Is doing well. NSR on EKG. No awareness of afib. No issues  with eliquis, renal function checked in May and stable.  Today, he denies symptoms of palpitations, chest pain, shortness of breath, orthopnea, PND, lower extremity edema, dizziness, presyncope, syncope, or neurologic sequela.  The patient feels that he is tolerating medications without difficulties and is otherwise without complaint today.     Past Medical History  Diagnosis Date  . History of syncope 1994    no etiology; no recurrence  . Hypertension   . Hyperlipidemia     LDL goal = < 100, ideally < 70  . GERD (gastroesophageal reflux disease)   . Erectile dysfunction   . Diverticulosis 2006  . Rotator cuff tear   . History of skin cancer   . PVC's (premature ventricular contractions)   . Anticoagulation adequate, new on eliquis 03/07/2014  . Atrial fibrillation with RVR, new onset, CHA2DS2-VASc score 2- back to SR at discharge 03/06/2014  . Dysrhythmia     Hx A-Fib from 12/2013-new onset- on meds   Past Surgical History  Procedure Laterality Date  . Tonsillectomy and adenoidectomy    . Cystectomy      lip  . Colonoscopy  2006    Diverticulosis  . Spine surgery  2012    Dr Gladstone Lighter  . Cystectomy      anterior thorax  . Inguinal hernia repair       x 2  . Shoulder open rotator cuff repair Right 04/23/2014    Procedure: RIGHT SHOULDER ROTATOR CUFF REPAIR WITH GRAFT AND ANCHORS . OPEN ACROMIONECTOMY;  Surgeon: Tobi Bastos, MD;   Location: [facility];  Service: Orthopedics;  Laterality: Right;    Current Outpatient Prescriptions  Medication Sig Dispense Refill  . apixaban (ELIQUIS) 5 MG TABS tablet Take 1 tablet (5 mg total) by mouth 2 (two) times daily. 60 tablet 11  . Cholecalciferol (VITAMIN D3) 2000 UNITS TABS Take 1 capsule by mouth daily.     . metoprolol tartrate (LOPRESSOR) 25 MG tablet Take 1 tablet (25 mg total) by mouth 2 (two) times daily. 180 tablet 3  . Multiple Vitamin (MULTIVITAMIN WITH MINERALS) TABS tablet Take 1 tablet by mouth daily.    . quinapril (ACCUPRIL) 40 MG tablet Take 1 tablet (40 mg total) by mouth every morning. 90 tablet 3  . vitamin B-12 (CYANOCOBALAMIN) 1000 MCG tablet Take 1,000 mcg by mouth daily.      No current facility-administered medications for this encounter.    Allergies  Allergen Reactions  . Shellfish-Derived Products Itching    Itching feet and palms, tightness in chest    History   Social History  . Marital Status: Married    Spouse Name: N/A  . Number of Children: N/A  . Years of Education: N/A   Occupational History  . Not on file.   Social History Main Topics  . Smoking status: Former Smoker  Quit date: 04/26/1990  . Smokeless tobacco: Not on file     Comment: smoked [place] with 12 year abstinence ( 21 total years of smoking), up to 1 ppd  . Alcohol Use: No  . Drug Use: No  . Sexual Activity: Not on file   Other Topics Concern  . Not on file   Social History Narrative    Family History  Problem Relation Age of Onset  . Heart attack Father 27  . Diabetes Mother   . Transient ischemic attack Mother 30  . Diabetes Sister   . Heart attack Sister 44  . Stomach cancer Maternal Grandfather     ROS-  All systems are reviewed and are negative except as outlined in the HPI above  Physical Exam: Filed Vitals:   06/03/15 0956  BP: 130/78  Pulse: 63  Height: 6' (1.829 m)  Weight: 203 lb 2 oz (92.137 kg)    GEN- The patient is well  appearing, alert and oriented x 3 today.   Head- normocephalic, atraumatic Eyes-  Sclera clear, conjunctiva pink Ears- hearing intact Oropharynx- clear Neck- supple, no JVP Lymph- no cervical lymphadenopathy Lungs- Clear to ausculation bilaterally, normal work of breathing Heart- Regular rate and rhythm, no murmurs, rubs or gallops, PMI not laterally displaced GI- soft, NT, ND, + BS Extremities- no clubbing, cyanosis, or edema MS- no significant deformity or atrophy Skin- no rash or lesion Psych- euthymic mood, full affect Neuro- strength and sensation are intact  Ekg- Normal sinus rhythm with PAC's.    Assessment and Plan:  1. Paroxysmal atrial fibrillation Maintaining NSR Continue metoprolol.  2. Chadsvasc score of at least 2. Continue eliquis.   Stable No change required today  Return 1 year.

## 2015-06-03 NOTE — Patient Instructions (Signed)
Medication Instructions:  Your physician recommends that you continue on your current medications as directed. Please refer to the Current Medication list given to you today.   Labwork: None ordered  Testing/Procedures: None ordered  Follow-Up: Your physician wants you to follow-up in: 12 months with Roderic Palau, NP You will receive a reminder letter in the mail two months in advance. If you don't receive a letter, please call our office to schedule the follow-up appointment.   Any Other Special Instructions Will Be Listed Below (If Applicable).

## 2015-06-12 ENCOUNTER — Encounter: Payer: Self-pay | Admitting: Gastroenterology

## 2015-06-24 ENCOUNTER — Ambulatory Visit: Payer: BLUE CROSS/BLUE SHIELD | Admitting: Internal Medicine

## 2015-07-02 DIAGNOSIS — H43811 Vitreous degeneration, right eye: Secondary | ICD-10-CM | POA: Diagnosis not present

## 2015-07-03 ENCOUNTER — Encounter: Payer: Self-pay | Admitting: Physician Assistant

## 2015-07-30 DIAGNOSIS — H903 Sensorineural hearing loss, bilateral: Secondary | ICD-10-CM | POA: Diagnosis not present

## 2015-08-06 ENCOUNTER — Other Ambulatory Visit: Payer: Self-pay | Admitting: *Deleted

## 2015-08-06 ENCOUNTER — Telehealth: Payer: Self-pay | Admitting: *Deleted

## 2015-08-06 ENCOUNTER — Ambulatory Visit (INDEPENDENT_AMBULATORY_CARE_PROVIDER_SITE_OTHER): Payer: Medicare Other | Admitting: Physician Assistant

## 2015-08-06 ENCOUNTER — Encounter: Payer: Self-pay | Admitting: Physician Assistant

## 2015-08-06 VITALS — BP 110/66 | HR 68 | Ht 72.0 in | Wt 200.0 lb

## 2015-08-06 DIAGNOSIS — Z7901 Long term (current) use of anticoagulants: Secondary | ICD-10-CM | POA: Diagnosis not present

## 2015-08-06 DIAGNOSIS — Z1211 Encounter for screening for malignant neoplasm of colon: Secondary | ICD-10-CM

## 2015-08-06 NOTE — Progress Notes (Signed)
Patient ID: Sergio Mack, male   DOB: [birth year], 69 y.o.   MRN: [id number]   Subjective:    Patient ID: Sergio Mack, male    DOB: Apr 12, 1946, 69 y.o.   MRN: [id number]  HPI Sergio Mack is a pleasant 69 year old white male known remotely to Dr. Deatra Ina from prior colonoscopy who comes in today to schedule follow-up screening colonoscopy. He last had colonoscopy in 2006 and this was a negative exam with the exception of scattered sigmoid diverticulosis. Patient currently has no GI complaints, specifically denies any problems with abdominal pain and changes in bowel habits melena or hematochezia. He says he is generally very regular having 2-3 bowel movements per day. He does have history of hypertension and atrial fibrillation for which he is on Eliquis's. He is followed by Dr. Rayann Heman -he says he has not had any recent cardiac issues.  Review of Systems Pertinent positive and negative review of systems were noted in the above HPI section.  All other review of systems was otherwise negative.  Outpatient Encounter Prescriptions as of 08/06/2015  Medication Sig  . apixaban (ELIQUIS) 5 MG TABS tablet Take 1 tablet (5 mg total) by mouth 2 (two) times daily.  . Cholecalciferol (VITAMIN D3) 2000 UNITS TABS Take 1 capsule by mouth daily.   . metoprolol tartrate (LOPRESSOR) 25 MG tablet Take 1 tablet (25 mg total) by mouth 2 (two) times daily.  . Multiple Vitamin (MULTIVITAMIN WITH MINERALS) TABS tablet Take 1 tablet by mouth daily.  . quinapril (ACCUPRIL) 40 MG tablet Take 1 tablet (40 mg total) by mouth every morning.  . vitamin B-12 (CYANOCOBALAMIN) 1000 MCG tablet Take 1,000 mcg by mouth daily.    No facility-administered encounter medications on file as of 08/06/2015.   Allergies  Allergen Reactions  . Shellfish-Derived Products Itching    Itching feet and palms, tightness in chest   Patient Active Problem List   Diagnosis Date Noted  . Complete tear of rotator cuff 04/23/2014  . Full thickness  rotator cuff tear 04/23/2014  . Anticoagulation adequate, new on eliquis 03/07/2014  . Paroxysmal atrial fibrillation 03/06/2014  . Erectile dysfunction 03/28/2012  . PREMATURE VENTRICULAR CONTRACTIONS, FREQUENT 05/13/2010  . SKIN CANCER, HX OF 03/24/2010  . DIVERTICULOSIS, COLON 12/11/2008  . Hyperglycemia 12/11/2008  . SPINAL STENOSIS 01/14/2008  . BACK PAIN, LUMBAR, WITH RADICULOPATHY 01/02/2008  . HYPERLIPIDEMIA 10/31/2007  . 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 Essential hypertension 10/31/2007  . 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 GERD 09/27/2007   Social History   Social History  . Marital Status: Married    Spouse Name: N/A  . Number of Children: N/A  . Years of Education: N/A   Occupational History  . Not on file.   Social History Main Topics  . Smoking status: Former Smoker    Quit date: 04/26/1990  . Smokeless tobacco: Never Used     Comment: smoked [place] with 12 year abstinence ( 21 total years of smoking), up to 1 ppd  . Alcohol Use: No  . Drug Use: No  . Sexual Activity: Not on file   Other Topics Concern  . Not on file   Social History Narrative    Mr. Mack's family history includes Cirrhosis in his paternal uncle; Diabetes in his mother and sister; Heart attack (age of onset: 42) in his sister; Heart attack (age of onset: 49) in his father; Stomach cancer in his maternal grandfather; Transient ischemic attack (age of  onset: 22) in his mother. There is no history of Colon cancer or Colon polyps.      Objective:    Filed Vitals:   08/06/15 0856  BP: 110/66  Pulse: 68    Physical Exam   relative older white male in no acute distress, pleasant blood pressure 110/66 pulse 68 height 6 foot weight 200. HEENT; nontraumatic normocephalic EOMI PERRLA sclera anicteric, Supple; no JVD, Cardiovascular; irregular rate and rhythm with S1-S2 no murmur or gallop, Pulmonary; clear bilaterally, Abdomen; large soft  nontender nondistended bowel sounds are active there is no palpable mass or hepatosplenomegaly, Rectal ;exam not done, EXt;no clubbing cyanosis or edema skin warm and dry, Neuropsych; mood and affect appropriate       Assessment & Plan:   #1 69 yo male  Her for colon neoplasia screening- asymptomatic,average risk-negative colonoscopy 2006 #2 diverticulosis  #3 chronic anticoagulation #4 Atrial fib  Plan; Will schedule for Colonoscopy with Dr. Deatra Ina- procedure discussed in detail and pt is agreeable to proceed.  We will communicate with Dr Rayann Heman ,pt's cardiologist to assure that holding Eliquis for 48 hours prior to procedure is acceptable for this pt.    Dimonique Bourdeau Genia Harold PA-C 08/06/2015   Cc: Hendricks Limes, MD

## 2015-08-06 NOTE — Patient Instructions (Signed)
You have been scheduled for a colonoscopy. Please follow written instructions given to you at your visit today.  Please pick up your prep supplies at the pharmacy within the next 1-3 days. If you use inhalers (even only as needed), please bring them with you on the day of your procedure.   

## 2015-08-06 NOTE — Telephone Encounter (Signed)
08/06/2015   RE: Sergio Mack DOB: [birth year] MRN: [id number]   Dear  Dr. Allean Found,    We have scheduled the above patient for an endoscopic procedure. Our records show that he is on anticoagulation therapy.   Please advise as to how long the patient may come off his therapy of  Eliquis prior to the procedure, which is scheduled for 10-14-2015.  Please fax back/ or route the completed form to [place] at [phone].   Sincerely,    Amy Esterwood PA-C

## 2015-08-08 NOTE — Telephone Encounter (Signed)
Will route to anticoagulation clinic for management per [facility] protocol.

## 2015-08-10 NOTE — Telephone Encounter (Signed)
Pt has a CHADS score of 1.  Okay to hold Eliquis x 48 hours prior to procedure.

## 2015-08-10 NOTE — Telephone Encounter (Signed)
LM for the patient asking him to call back. I see he cancelled the appointment for the colonoscopy on 10-14-2015.  There is no rescheduled appointment in our system. I also advised when he does call back to reschedule to also ask for Pam. I have the Eliquis clearance information from Dr. Rayann Heman.

## 2015-08-10 NOTE — Progress Notes (Signed)
Reviewed and agree with management. Robert D. Kaplan, M.D., FACG  

## 2015-08-25 NOTE — Telephone Encounter (Signed)
Patient rescheduled procedure to 10/28/15 at 9:00am

## 2015-09-14 DIAGNOSIS — N401 Enlarged prostate with lower urinary tract symptoms: Secondary | ICD-10-CM | POA: Diagnosis not present

## 2015-09-14 DIAGNOSIS — R351 Nocturia: Secondary | ICD-10-CM | POA: Diagnosis not present

## 2015-09-14 DIAGNOSIS — N5201 Erectile dysfunction due to arterial insufficiency: Secondary | ICD-10-CM | POA: Diagnosis not present

## 2015-09-17 DIAGNOSIS — Z23 Encounter for immunization: Secondary | ICD-10-CM | POA: Diagnosis not present

## 2015-10-01 DIAGNOSIS — R361 Hematospermia: Secondary | ICD-10-CM | POA: Diagnosis not present

## 2015-10-14 ENCOUNTER — Encounter: Payer: BLUE CROSS/BLUE SHIELD | Admitting: Gastroenterology

## 2015-10-14 DIAGNOSIS — R361 Hematospermia: Secondary | ICD-10-CM | POA: Diagnosis not present

## 2015-10-28 ENCOUNTER — Ambulatory Visit (AMBULATORY_SURGERY_CENTER): Payer: Medicare Other | Admitting: Gastroenterology

## 2015-10-28 ENCOUNTER — Encounter: Payer: Self-pay | Admitting: Gastroenterology

## 2015-10-28 VITALS — BP 125/77 | HR 82 | Temp 96.3°F | Resp 17 | Ht 72.0 in | Wt 200.0 lb

## 2015-10-28 DIAGNOSIS — D122 Benign neoplasm of ascending colon: Secondary | ICD-10-CM | POA: Diagnosis not present

## 2015-10-28 DIAGNOSIS — D124 Benign neoplasm of descending colon: Secondary | ICD-10-CM

## 2015-10-28 DIAGNOSIS — K621 Rectal polyp: Secondary | ICD-10-CM | POA: Diagnosis not present

## 2015-10-28 DIAGNOSIS — D123 Benign neoplasm of transverse colon: Secondary | ICD-10-CM | POA: Diagnosis not present

## 2015-10-28 DIAGNOSIS — D128 Benign neoplasm of rectum: Secondary | ICD-10-CM

## 2015-10-28 DIAGNOSIS — Z1211 Encounter for screening for malignant neoplasm of colon: Secondary | ICD-10-CM

## 2015-10-28 DIAGNOSIS — I4891 Unspecified atrial fibrillation: Secondary | ICD-10-CM | POA: Diagnosis not present

## 2015-10-28 LAB — GLUCOSE, CAPILLARY: Glucose-Capillary: 67 mg/dL (ref 65–99)

## 2015-10-28 MED ORDER — SODIUM CHLORIDE 0.9 % IV SOLN: 500.0000 mL | INTRAVENOUS | Status: DC

## 2015-10-28 NOTE — Patient Instructions (Signed)
YOU MAY RESTART YOUR ELIQUIS TOMORROW AM.    YOU HAD AN ENDOSCOPIC PROCEDURE TODAY AT [facility]:   Refer to the procedure report that was given to you for any specific questions about what was found during the examination.  If the procedure report does not answer your questions, please call your gastroenterologist to clarify.  If you requested that your care partner not be given the details of your procedure findings, then the procedure report has been included in a sealed envelope for you to review at your convenience later.  YOU SHOULD EXPECT: Some feelings of bloating in the abdomen. Passage of more gas than usual.  Walking can help get rid of the air that was put into your GI tract during the procedure and reduce the bloating. If you had a lower endoscopy (such as a colonoscopy or flexible sigmoidoscopy) you may notice spotting of blood in your stool or on the toilet paper. If you underwent a bowel prep for your procedure, you may not have a normal bowel movement for a few days.  Please Note:  You might notice some irritation and congestion in your nose or some drainage.  This is from the oxygen used during your procedure.  There is no need for concern and it should clear up in a day or so.  SYMPTOMS TO REPORT IMMEDIATELY:   Following lower endoscopy (colonoscopy or flexible sigmoidoscopy):  Excessive amounts of blood in the stool  Significant tenderness or worsening of abdominal pains  Swelling of the abdomen that is new, acute  Fever of 100F or higher   For urgent or emergent issues, a gastroenterologist can be reached at any hour by calling [phone].   DIET: Your first meal following the procedure should be a small meal and then it is ok to progress to your normal diet. Heavy or fried foods are harder to digest and may make you feel nauseous or bloated.  Likewise, meals heavy in dairy and vegetables can increase bloating.  Drink plenty of fluids but you should  avoid alcoholic beverages for 24 hours.  ACTIVITY:  You should plan to take it easy for the rest of today and you should NOT DRIVE or use heavy machinery until tomorrow (because of the sedation medicines used during the test).    FOLLOW UP: Our staff will call the number listed on your records the next business day following your procedure to check on you and address any questions or concerns that you may have regarding the information given to you following your procedure. If we do not reach you, we will leave a message.  However, if you are feeling well and you are not experiencing any problems, there is no need to return our call.  We will assume that you have returned to your regular daily activities without incident.  If any biopsies were taken you will be contacted by phone or by letter within the next 1-3 weeks.  Please call us at [phone] if you have not heard about the biopsies in 3 weeks.    SIGNATURES/CONFIDENTIALITY: You and/or your care partner have signed paperwork which will be entered into your electronic medical record.  These signatures attest to the fact that that the information above on your After Visit Summary has been reviewed and is understood.  Full responsibility of the confidentiality of this discharge information lies with you and/or your care-partner.

## 2015-10-28 NOTE — Op Note (Addendum)
[place]  [address] [place], [phone]   COLONOSCOPY PROCEDURE REPORT  PATIENT: Mack, Sergio  MR#: [id number] BIRTHDATE: 07-11-1946 , 82  yrs. old GENDER: male ENDOSCOPIST: Harl Bowie, MD REFERRED [id number] [name], M.D. PROCEDURE DATE:  10/28/2015 PROCEDURE:   Colonoscopy, screening and Colonoscopy with snare polypectomy First Screening Colonoscopy - Avg.  risk and is 50 yrs.  old or older - No.  Prior Negative Screening - Now for repeat screening. 10 or more years since last screening  History of Adenoma - Now for follow-up colonoscopy & has been > or = to 3 yrs.  N/A  Polyps removed today? Yes ASA CLASS:   Class II INDICATIONS:Screening for colonic neoplasia and Colorectal Neoplasm Risk Assessment for this procedure is average risk. MEDICATIONS: Propofol 300 mg IV  DESCRIPTION OF PROCEDURE:   After the risks benefits and alternatives of the procedure were thoroughly explained, informed consent was obtained.  The digital rectal exam revealed no abnormalities of the rectum.   The LB CF-H180AL Loaner [id number] endoscope was introduced through the anus and advanced to the cecum, which was identified by both the appendix and ileocecal valve. No adverse events experienced.   The quality of the prep was good.  The instrument was then slowly withdrawn as the colon was fully examined. Estimated blood loss is zero unless otherwise noted in this procedure report.   COLON FINDINGS: Six sessile polyps ranging between 3-40mm in size were found in the rectum (3), descending colon(1), and ascending colon (2).  A polypectomy was performed with a cold snare.  The resection was complete, the polyp tissue was completely retrieved and sent to histology.  A polypectomy was performed using snare cautery in the transverse colon for polyp 11-12 mm in size.  The resection was complete, the polyp tissue was completely retrieved and sent to histology.   There was  moderate diverticulosis noted in the sigmoid colon.  Retroflexed views revealed internal hemorrhoids. The time to cecum = 4.0 Withdrawal time = 23.5   The scope was withdrawn and the procedure completed. COMPLICATIONS: There were no immediate complications.  ENDOSCOPIC IMPRESSION: 1.   Six sessile polyps ranging between 3-25mm in size were found in the rectum, descending colon, and ascending colon; polypectomy was performed with a cold snare; polypectomy was performed using snare cautery 2.   Moderate diverticulosis was noted in the sigmoid colon  RECOMMENDATIONS: 1.  If the polyp(s) removed today are proven to be adenomatous (pre-cancerous) polyps, you will need a colonoscopy in 3 years. Otherwise you should continue to follow colorectal cancer screening guidelines for "routine risk" patients with a colonoscopy in 10 years.  You will receive a letter within 1-2 weeks with the results of your biopsy as well as final recommendations.  Please call my office if you have not received a letter after 3 weeks. 2.  Await pathology results 3. Ok to restart Eliquis tomorrow AM  eSigned:  Harl Bowie, MD 10/28/2015 10:55 AM Revised: 10/28/2015 10:55 AM

## 2015-10-28 NOTE — Progress Notes (Signed)
Called to room to assist during endoscopic procedure.  Patient ID and intended procedure confirmed with present staff. Received instructions for my participation in the procedure from the performing physician.  

## 2015-10-28 NOTE — Progress Notes (Signed)
A/ox3 pleased with MAC, report to Karen RN 

## 2015-10-29 ENCOUNTER — Telehealth: Payer: Self-pay

## 2015-10-29 NOTE — Telephone Encounter (Signed)
  Follow up Call-  Call back number 10/28/2015  Post procedure Call Back phone  # [phone]  Permission to leave phone message Yes     Patient questions:  Do you have a fever, pain , or abdominal swelling? No. Pain Score  0 *  Have you tolerated food without any problems? Yes.    Have you been able to return to your normal activities? Yes.    Do you have any questions about your discharge instructions: Diet   No. Medications  No. Follow up visit  No.  Do you have questions or concerns about your Care? No.  Actions: * If pain score is 4 or above: No action needed, pain <4.  No problems per the pt. maw

## 2015-11-09 ENCOUNTER — Encounter: Payer: Self-pay | Admitting: Gastroenterology

## 2015-11-09 ENCOUNTER — Telehealth: Payer: Self-pay | Admitting: Gastroenterology

## 2015-11-09 NOTE — Telephone Encounter (Signed)
I see him biopsy report. Has it been routed to you yet?

## 2015-11-11 NOTE — Telephone Encounter (Signed)
Pt's wife is calling back about biopsy results

## 2015-11-11 NOTE — Telephone Encounter (Signed)
Read the letter to [name].

## 2015-11-13 ENCOUNTER — Telehealth: Payer: Self-pay | Admitting: Internal Medicine

## 2015-11-13 NOTE — Telephone Encounter (Signed)
Called pt to set up AWV °

## 2015-12-24 DIAGNOSIS — L570 Actinic keratosis: Secondary | ICD-10-CM | POA: Diagnosis not present

## 2015-12-24 DIAGNOSIS — L309 Dermatitis, unspecified: Secondary | ICD-10-CM | POA: Diagnosis not present

## 2015-12-24 DIAGNOSIS — L821 Other seborrheic keratosis: Secondary | ICD-10-CM | POA: Diagnosis not present

## 2016-04-05 ENCOUNTER — Encounter: Payer: Self-pay | Admitting: Internal Medicine

## 2016-04-05 ENCOUNTER — Other Ambulatory Visit (INDEPENDENT_AMBULATORY_CARE_PROVIDER_SITE_OTHER): Payer: Medicare Other

## 2016-04-05 ENCOUNTER — Ambulatory Visit (INDEPENDENT_AMBULATORY_CARE_PROVIDER_SITE_OTHER): Payer: Medicare Other | Admitting: Internal Medicine

## 2016-04-05 VITALS — BP 140/70 | HR 70 | Temp 98.5°F | Resp 12 | Ht 72.0 in | Wt 205.1 lb

## 2016-04-05 DIAGNOSIS — Z1159 Encounter for screening for other viral diseases: Secondary | ICD-10-CM | POA: Diagnosis not present

## 2016-04-05 DIAGNOSIS — I480 Paroxysmal atrial fibrillation: Secondary | ICD-10-CM | POA: Diagnosis not present

## 2016-04-05 DIAGNOSIS — I10 Essential (primary) hypertension: Secondary | ICD-10-CM

## 2016-04-05 DIAGNOSIS — Z23 Encounter for immunization: Secondary | ICD-10-CM

## 2016-04-05 DIAGNOSIS — E782 Mixed hyperlipidemia: Secondary | ICD-10-CM

## 2016-04-05 DIAGNOSIS — R739 Hyperglycemia, unspecified: Secondary | ICD-10-CM

## 2016-04-05 DIAGNOSIS — Z0000 Encounter for general adult medical examination without abnormal findings: Secondary | ICD-10-CM

## 2016-04-05 DIAGNOSIS — Z85828 Personal history of other malignant neoplasm of skin: Secondary | ICD-10-CM

## 2016-04-05 LAB — COMPREHENSIVE METABOLIC PANEL
ALBUMIN: 4.4 g/dL (ref 3.5–5.2)
ALK PHOS: 79 U/L (ref 39–117)
ALT: 22 U/L (ref 0–53)
AST: 23 U/L (ref 0–37)
BILIRUBIN TOTAL: 0.7 mg/dL (ref 0.2–1.2)
BUN: 6 mg/dL (ref 6–23)
CALCIUM: 9.5 mg/dL (ref 8.4–10.5)
CO2: 26 meq/L (ref 19–32)
CREATININE: 0.84 mg/dL (ref 0.40–1.50)
Chloride: 95 mEq/L — ABNORMAL LOW (ref 96–112)
GFR: 95.85 mL/min (ref >60.00)
Glucose, Bld: 116 mg/dL — ABNORMAL HIGH (ref 70–99)
Potassium: 5 mEq/L (ref 3.5–5.1)
Sodium: 129 mEq/L — ABNORMAL LOW (ref 135–145)
Total Protein: 7.2 g/dL (ref 6.0–8.3)

## 2016-04-05 LAB — LIPID PANEL
CHOLESTEROL: 126 mg/dL (ref 0–200)
HDL: 32 mg/dL — AB (ref >39.00)
LDL Cholesterol: 83 mg/dL (ref 0–99)
NonHDL: 93.71
TRIGLYCERIDES: 55 mg/dL (ref 0.0–149.0)
Total CHOL/HDL Ratio: 4
VLDL: 11 mg/dL (ref 0.0–40.0)

## 2016-04-05 LAB — CBC WITH DIFFERENTIAL/PLATELET
BASOS ABS: 0 10*3/uL (ref 0.0–0.1)
BASOS PCT: 0.4 % (ref 0.0–3.0)
EOS ABS: 0.1 10*3/uL (ref 0.0–0.7)
Eosinophils Relative: 0.8 % (ref 0.0–5.0)
HEMATOCRIT: 43.3 % (ref 39.0–52.0)
HEMOGLOBIN: 14.7 g/dL (ref 13.0–17.0)
LYMPHS PCT: 16.4 % (ref 12.0–46.0)
Lymphs Abs: 1.8 10*3/uL (ref 0.7–4.0)
MCHC: 33.9 g/dL (ref 30.0–36.0)
MCV: 83.8 fl (ref 78.0–100.0)
MONOS PCT: 8.9 % (ref 3.0–12.0)
Monocytes Absolute: 1 10*3/uL (ref 0.1–1.0)
NEUTROS ABS: 7.8 10*3/uL — AB (ref 1.4–7.7)
Neutrophils Relative %: 73.5 % (ref 43.0–77.0)
PLATELETS: 305 10*3/uL (ref 150.0–400.0)
RBC: 5.17 Mil/uL (ref 4.22–5.81)
RDW: 14.1 % (ref 11.5–15.5)
WBC: 10.7 10*3/uL — AB (ref 4.0–10.5)

## 2016-04-05 LAB — TSH: TSH: 1.16 u[IU]/mL (ref 0.35–4.50)

## 2016-04-05 LAB — HEMOGLOBIN A1C: HEMOGLOBIN A1C: 6.3 % (ref 4.6–6.5)

## 2016-04-05 NOTE — Assessment & Plan Note (Signed)
Check a1c 

## 2016-04-05 NOTE — Assessment & Plan Note (Signed)
BP well controlled Current regimen effective and well tolerated Continue current medications at current doses  

## 2016-04-05 NOTE — Progress Notes (Signed)
Subjective:    Patient ID: Sergio Mack, male    DOB: 03-Jul-1946, 70 y.o.   MRN: [id number]  HPI He is here to establish with a new pcp.   Here for medicare wellness exam.   He feels like he has less energy than he should.  He feels refreshed when he wakes up and feels ok.  Then he gets fatigued around 1pm.  He thinks it started around the time he started the metoprolol.   Hypertension: He is taking his medication daily. He is compliant with a low sodium diet.  He denies chest pain, frequent palpitations, daily edema, daily shortness of breath and lightheadedness. He is not exercising regularly.  He does not monitor his blood pressure at home.     I have personally reviewed and have noted 1.The patient's medical and social history 2.Their use of alcohol, tobacco or illicit drugs 3.Their current medications and supplements 4.The patient's functional ability including ADL's, fall risks, home safety risks and hearing or visual impairment. 5.Diet and physical activities 6.Evidence for depression or mood disorders 7.Care team reviewed and updated - Dr Rayann Heman - cariology, Derm - Dr Allyson Sabal,              urology - Dr Kennith Center, Dr Nicki Reaper - eye   Are there smokers in your home (other than you)? No  Risk Factors Exercise: no regular exercise - active Dietary issues discussed: eats a lot of sugar/candy, eats everything  Cardiac risk factors: advanced age, hypertension, hyperlipidemia  Depression Screen  Have you felt down, depressed or hopeless? No  Have you felt little interest or pleasure in doing things?  No  Activities of Daily Living In your present state of health, do you have any difficulty performing the following activities?:  Driving? No Managing money?  No Feeding yourself? No Getting from bed to chair? No Climbing a flight of stairs? No Preparing food and eating?: No Bathing or showering? No Getting  dressed: No Getting to/using the toilet? No Moving around from place to place: No In the past year have you fallen or had a near fall?: No   Are you sexually active?  yes  Do you have more than one partner?  N/A  Hearing Difficulties: yes, wearing hearing aid Do you often ask people to speak up or repeat themselves? yes Do you experience ringing or noises in your ears? yes Do you have difficulty understanding soft or whispered voices? yes Vision:              Any change in vision:  no             Up to date with eye exam: Up to date  Memory:  Do you feel that you have a problem with memory? Yes, short term  Do you often misplace items? No  Do you feel safe at home?  Yes  Cognitive Testing  Alert, Orientated? Yes  Normal Appearance? Yes  Recall of three objects?  Yes  Can perform simple calculations? Yes  Displays appropriate judgment? Yes  Can read the correct time from a watch face? Yes   Advanced Directives have been discussed with the patient? Yes  - in place      Medications and allergies reviewed with patient and updated if appropriate.  Patient Active Problem List   Diagnosis Date Noted  . Full thickness rotator cuff tear 04/23/2014  . Anticoagulation adequate, new on eliquis 03/07/2014  . Paroxysmal atrial fibrillation ([place]) 03/06/2014  .  Erectile dysfunction 03/28/2012  . PREMATURE VENTRICULAR CONTRACTIONS, FREQUENT 05/13/2010  . SKIN CANCER, HX OF 03/24/2010  . DIVERTICULOSIS, COLON 12/11/2008  . Hyperglycemia 12/11/2008  . SPINAL STENOSIS 01/14/2008  . BACK PAIN, LUMBAR, WITH RADICULOPATHY 01/02/2008  . HYPERLIPIDEMIA 10/31/2007  . 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 Essential hypertension 10/31/2007  . 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 GERD 09/27/2007    Current Outpatient Prescriptions on File Prior to Visit  Medication Sig Dispense Refill  . apixaban (ELIQUIS) 5 MG TABS tablet Take 1 tablet (5 mg total) by  mouth 2 (two) times daily. 60 tablet 11  . Cholecalciferol (VITAMIN D3) 2000 UNITS TABS Take 1 capsule by mouth daily.     . metoprolol tartrate (LOPRESSOR) 25 MG tablet Take 1 tablet (25 mg total) by mouth 2 (two) times daily. 180 tablet 3  . Multiple Vitamin (MULTIVITAMIN WITH MINERALS) TABS tablet Take 1 tablet by mouth daily.    . quinapril (ACCUPRIL) 40 MG tablet Take 1 tablet (40 mg total) by mouth every morning. 90 tablet 3  . vitamin B-12 (CYANOCOBALAMIN) 1000 MCG tablet Take 1,000 mcg by mouth daily.      No current facility-administered medications on file prior to visit.    Past Medical History  Diagnosis Date  . History of syncope 1994    no etiology; no recurrence  . Hypertension   . Hyperlipidemia     LDL goal = < 100, ideally < 70  . GERD (gastroesophageal reflux disease)   . Erectile dysfunction   . Diverticulosis 2006  . Rotator cuff tear   . History of skin cancer   . PVC's (premature ventricular contractions)   . Anticoagulation adequate, new on eliquis 03/07/2014  . Atrial fibrillation with RVR, new onset, CHA2DS2-VASc score 2- back to SR at discharge 03/06/2014  . Dysrhythmia     Hx A-Fib from 12/2013-new onset- on meds    Past Surgical History  Procedure Laterality Date  . Tonsillectomy and adenoidectomy    . Cystectomy      lip  . Colonoscopy  2006    Diverticulosis  . Spine surgery  2012    Dr Sergio Mack  . Cystectomy      anterior thorax-chest  . Inguinal hernia repair       x 2  . Shoulder open rotator cuff repair Right 04/23/2014    Procedure: RIGHT SHOULDER ROTATOR CUFF REPAIR WITH GRAFT AND ANCHORS . OPEN ACROMIONECTOMY;  Surgeon: Tobi Bastos, MD;  Location: [facility];  Service: Orthopedics;  Laterality: Right;    Social History   Social History  . Marital Status: Married    Spouse Name: N/A  . Number of Children: N/A  . Years of Education: N/A   Social History Main Topics  . Smoking status: Former Smoker    Quit date: 04/26/1990  .  Smokeless tobacco: Never Used     Comment: smoked [place] with 12 year abstinence ( 21 total years of smoking), up to 1 ppd  . Alcohol Use: No  . Drug Use: No  . Sexual Activity: Not Asked   Other Topics Concern  . None   Social History Narrative    Family History  Problem Relation Age of Onset  . Heart attack Father 78  . Diabetes Mother   . Transient ischemic attack Mother 22  . Diabetes Sister   . Heart attack Sister 17  . Stomach cancer Maternal Grandfather   . Colon cancer  Neg Hx   . Colon polyps Neg Hx   . Cirrhosis Paternal Uncle     alcoholic    Review of Systems  Constitutional: Positive for fatigue. Negative for fever, chills and appetite change.  HENT: Positive for hearing loss (wears hearing aids).   Eyes: Negative for visual disturbance.  Respiratory: Positive for cough (with allergies) and shortness of breath (with strenous acitivity). Negative for wheezing.   Cardiovascular: Positive for palpitations (occasional) and leg swelling (only if on feet all day). Negative for chest pain.  Gastrointestinal: Negative for nausea, abdominal pain, diarrhea, constipation and blood in stool.       Gerd on occ  Genitourinary: Negative for dysuria, hematuria and difficulty urinating.  Musculoskeletal: Positive for back pain (if bends over a lot - neck/upper back) and arthralgias (right shoulder).  Skin: Negative for color change and rash.  Neurological: Positive for headaches (occasional). Negative for dizziness and light-headedness.  Psychiatric/Behavioral: Negative for dysphoric mood. The patient is not nervous/anxious.        Objective:   Filed Vitals:   04/05/16 0824 04/05/16 0843  BP: 170/70 140/70  Pulse: 70   Temp: 98.5 F (36.9 C)   Resp: 12    Filed Weights   04/05/16 0824  Weight: 205 lb 1.9 oz (93.042 kg)   Body mass index is 27.81 kg/(m^2).   Physical Exam Constitutional: He appears well-developed and well-nourished. No distress.  HENT:    Head: Normocephalic and atraumatic.  Right Ear: External ear normal.  Left Ear: External ear normal.  Mouth/Throat: Oropharynx is clear and moist.  Normal ear canals and TM b/l  Eyes: Conjunctivae and EOM are normal.  Neck: Neck supple. No tracheal deviation present. No thyromegaly present.  No carotid bruit  Cardiovascular: Normal rate, regular rhythm, normal heart sounds and intact distal pulses.   No murmur heard. Pulmonary/Chest: Effort normal and breath sounds normal. No respiratory distress. He has no wheezes. He has no rales.  Abdominal: Soft. Bowel sounds are normal. He exhibits no distension. There is no tenderness.  Genitourinary: deferred  Musculoskeletal: He exhibits no edema.  Lymphadenopathy:    He has no cervical adenopathy.  Skin: Skin is warm and dry. He is not diaphoretic.  Psychiatric: He has a normal mood and affect. His behavior is normal.         Assessment & Plan:   Wellness Exam: Immunizations tetanus today, otherwise up to date Colonoscopy Up to date  Eye exam Up to date  Hearing loss - yes, wears hearing aids Memory concerns/difficulties - none Independent of ADLs - fully    Patient received copy of preventative screening tests/immunizations recommended for the next 5-10 years.  See Problem List for Assessment and Plan of chronic medical problems.  Folllow up annually

## 2016-04-05 NOTE — Progress Notes (Signed)
Pre visit review using our clinic review tool, if applicable. No additional management support is needed unless otherwise documented below in the visit note. 

## 2016-04-05 NOTE — Assessment & Plan Note (Signed)
Sees derm annually 

## 2016-04-05 NOTE — Assessment & Plan Note (Signed)
Check lipid panel  

## 2016-04-05 NOTE — Patient Instructions (Signed)
Sergio Mack , Thank you for taking time to come for your Medicare Wellness Visit. I appreciate your ongoing commitment to your health goals. Please review the following plan we discussed and let me know if I can assist you in the future.   These are the goals we discussed: Goals    Increase exercise, weight loss, improve diet      This is a list of the screening recommended for you and due dates:  Health Maintenance  Topic Date Due  .  Hepatitis C: One time screening is recommended by Center for Disease Control  (CDC) for  adults born from [birth year] through 1966.   [birth year]  . Flu Shot  06/21/2016  . Colon Cancer Screening  10/27/2018  . Tetanus Vaccine  04/05/2026  . Shingles Vaccine  Completed  . Pneumonia vaccines  Completed    Test(s) ordered today. Your results will be released to [place] (or called to you) after review, usually within 72hours after test completion. If any changes need to be made, you will be notified at that same time.  All other Health Maintenance issues reviewed.   All recommended immunizations and age-appropriate screenings are up-to-date or discussed.  Tetanus vaccine administered today.   Medications reviewed and updated.  No changes recommended at this time.   Please followup in one year  Health Maintenance, Male A healthy lifestyle and preventative care can promote health and wellness.  Maintain regular health, dental, and eye exams.  Eat a healthy diet. Foods like vegetables, fruits, whole grains, low-fat dairy products, and lean protein foods contain the nutrients you need and are low in calories. Decrease your intake of foods high in solid fats, added sugars, and salt. Get information about a proper diet from your health care provider, if necessary.  Regular physical exercise is one of the most important things you can do for your health. Most adults should get at least 150 minutes of moderate-intensity exercise (any activity that increases your  heart rate and causes you to sweat) each week. In addition, most adults need muscle-strengthening exercises on 2 or more days a week.   Maintain a healthy weight. The body mass index (BMI) is a screening tool to identify possible weight problems. It provides an estimate of body fat based on height and weight. Your health care provider can find your BMI and can help you achieve or maintain a healthy weight. For males 20 years and older:  A BMI below 18.5 is considered underweight.  A BMI of 18.5 to 24.9 is normal.  A BMI of 25 to 29.9 is considered overweight.  A BMI of 30 and above is considered obese.  Maintain normal blood lipids and cholesterol by exercising and minimizing your intake of saturated fat. Eat a balanced diet with plenty of fruits and vegetables. Blood tests for lipids and cholesterol should begin at age 76 and be repeated every 5 years. If your lipid or cholesterol levels are high, you are over age 49, or you are at high risk for heart disease, you may need your cholesterol levels checked more frequently.Ongoing high lipid and cholesterol levels should be treated with medicines if diet and exercise are not working.  If you smoke, find out from your health care provider how to quit. If you do not use tobacco, do not start.  Lung cancer screening is recommended for adults aged 46-80 years who are at high risk for developing lung cancer because of a history of smoking. A yearly low-dose CT  scan of the lungs is recommended for people who have at least a 30-pack-year history of smoking and are current smokers or have quit within the past 15 years. A pack year of smoking is smoking an average of 1 pack of cigarettes a day for 1 year (for example, a 30-pack-year history of smoking could mean smoking 1 pack a day for 30 years or 2 packs a day for 15 years). Yearly screening should continue until the smoker has stopped smoking for at least 15 years. Yearly screening should be stopped for  people who develop a health problem that would prevent them from having lung cancer treatment.  If you choose to drink alcohol, do not have more than 2 drinks per day. One drink is considered to be 12 oz (360 mL) of beer, 5 oz (150 mL) of wine, or 1.5 oz (45 mL) of liquor.  Avoid the use of street drugs. Do not share needles with anyone. Ask for help if you need support or instructions about stopping the use of drugs.  High blood pressure causes heart disease and increases the risk of stroke. High blood pressure is more likely to develop in:  People who have blood pressure in the end of the normal range (100-139/85-89 mm Hg).  People who are overweight or obese.  People who are African American.  If you are 54-39 years of age, have your blood pressure checked every 3-5 years. If you are 61 years of age or older, have your blood pressure checked every year. You should have your blood pressure measured twice--once when you are at a hospital or clinic, and once when you are not at a hospital or clinic. Record the average of the two measurements. To check your blood pressure when you are not at a hospital or clinic, you can use:  An automated blood pressure machine at a pharmacy.  A home blood pressure monitor.  If you are 10-28 years old, ask your health care provider if you should take aspirin to prevent heart disease.  Diabetes screening involves taking a blood sample to check your fasting blood sugar level. This should be done once every 3 years after age 34 if you are at a normal weight and without risk factors for diabetes. Testing should be considered at a younger age or be carried out more frequently if you are overweight and have at least 1 risk factor for diabetes.  Colorectal cancer can be detected and often prevented. Most routine colorectal cancer screening begins at the age of 63 and continues through age 20. However, your health care provider may recommend screening at an earlier  age if you have risk factors for colon cancer. On a yearly basis, your health care provider may provide home test kits to check for hidden blood in the stool. A small camera at the end of a tube may be used to directly examine the colon (sigmoidoscopy or colonoscopy) to detect the earliest forms of colorectal cancer. Talk to your health care provider about this at age 64 when routine screening begins. A direct exam of the colon should be repeated every 5-10 years through age 54, unless early forms of precancerous polyps or small growths are found.  People who are at an increased risk for hepatitis B should be screened for this virus. You are considered at high risk for hepatitis B if:  You were born in a country where hepatitis B occurs often. Talk with your health care provider about which countries are  considered high risk.  Your parents were born in a high-risk country and you have not received a shot to protect against hepatitis B (hepatitis B vaccine).  You have HIV or AIDS.  You use needles to inject street drugs.  You live with, or have sex with, someone who has hepatitis B.  You are a man who has sex with other men (MSM).  You get hemodialysis treatment.  You take certain medicines for conditions like cancer, organ transplantation, and autoimmune conditions.  Hepatitis C blood testing is recommended for all people born from [birth year] through 1965 and any individual with known risk factors for hepatitis C.  Healthy men should no longer receive prostate-specific antigen (PSA) blood tests as part of routine cancer screening. Talk to your health care provider about prostate cancer screening.  Testicular cancer screening is not recommended for adolescents or adult males who have no symptoms. Screening includes self-exam, a health care provider exam, and other screening tests. Consult with your health care provider about any symptoms you have or any concerns you have about testicular  cancer.  Practice safe sex. Use condoms and avoid high-risk sexual practices to reduce the spread of sexually transmitted infections (STIs).  You should be screened for STIs, including gonorrhea and chlamydia if:  You are sexually active and are younger than 24 years.  You are older than 24 years, and your health care provider tells you that you are at risk for this type of infection.  Your sexual activity has changed since you were last screened, and you are at an increased risk for chlamydia or gonorrhea. Ask your health care provider if you are at risk.  If you are at risk of being infected with HIV, it is recommended that you take a prescription medicine daily to prevent HIV infection. This is called pre-exposure prophylaxis (PrEP). You are considered at risk if:  You are a man who has sex with other men (MSM).  You are a heterosexual man who is sexually active with multiple partners.  You take drugs by injection.  You are sexually active with a partner who has HIV.  Talk with your health care provider about whether you are at high risk of being infected with HIV. If you choose to begin PrEP, you should first be tested for HIV. You should then be tested every 3 months for as long as you are taking PrEP.  Use sunscreen. Apply sunscreen liberally and repeatedly throughout the day. You should seek shade when your shadow is shorter than you. Protect yourself by wearing long sleeves, pants, a wide-brimmed hat, and sunglasses year round whenever you are outdoors.  Tell your health care provider of new moles or changes in moles, especially if there is a change in shape or color. Also, tell your health care provider if a mole is larger than the size of a pencil eraser.  A one-time screening for abdominal aortic aneurysm (AAA) and surgical repair of large AAAs by ultrasound is recommended for men aged 55-75 years who are current or former smokers.  Stay current with your vaccines  (immunizations).   This information is not intended to replace advice given to you by your health care provider. Make sure you discuss any questions you have with your health care provider.   Document Released: 05/05/2008 Document Revised: 11/28/2014 Document Reviewed: 04/04/2011 Elsevier Interactive Patient Education [employer].

## 2016-04-06 LAB — HEPATITIS C ANTIBODY: HCV Ab: NEGATIVE

## 2016-04-07 ENCOUNTER — Encounter: Payer: Self-pay | Admitting: Internal Medicine

## 2016-04-07 DIAGNOSIS — R7303 Prediabetes: Secondary | ICD-10-CM | POA: Insufficient documentation

## 2016-05-02 ENCOUNTER — Ambulatory Visit (INDEPENDENT_AMBULATORY_CARE_PROVIDER_SITE_OTHER): Payer: Medicare Other | Admitting: Internal Medicine

## 2016-05-02 ENCOUNTER — Encounter: Payer: Self-pay | Admitting: Internal Medicine

## 2016-05-02 VITALS — BP 162/86 | HR 82 | Temp 98.5°F | Resp 16 | Wt 206.0 lb

## 2016-05-02 DIAGNOSIS — L723 Sebaceous cyst: Secondary | ICD-10-CM | POA: Diagnosis not present

## 2016-05-02 DIAGNOSIS — L089 Local infection of the skin and subcutaneous tissue, unspecified: Secondary | ICD-10-CM

## 2016-05-02 DIAGNOSIS — I10 Essential (primary) hypertension: Secondary | ICD-10-CM

## 2016-05-02 MED ORDER — CEPHALEXIN 500 MG PO CAPS: 500.0000 mg | ORAL_CAPSULE | Freq: Three times a day (TID) | ORAL | Status: DC

## 2016-05-02 NOTE — Assessment & Plan Note (Signed)
Start warm compresses Start keflex - if no improvement he will call He will try to drain He will monitor the other areas of cyst, but feels they are almost resolved Work on weight loss

## 2016-05-02 NOTE — Patient Instructions (Signed)
An antibiotic was sent to your pharmacy for your infection.  Take as directed and call if there is no improvement. Apply warm compresses.   Monitor your blood pressure at home or a pharmacy.     Abscess An abscess is an infected area that contains a collection of pus and debris.It can occur in almost any part of the body. An abscess is also known as a furuncle or boil. CAUSES  An abscess occurs when tissue gets infected. This can occur from blockage of oil or sweat glands, infection of hair follicles, or a minor injury to the skin. As the body tries to fight the infection, pus collects in the area and creates pressure under the skin. This pressure causes pain. People with weakened immune systems have difficulty fighting infections and get certain abscesses more often.  SYMPTOMS Usually an abscess develops on the skin and becomes a painful mass that is red, warm, and tender. If the abscess forms under the skin, you may feel a moveable soft area under the skin. Some abscesses break open (rupture) on their own, but most will continue to get worse without care. The infection can spread deeper into the body and eventually into the bloodstream, causing you to feel ill.  DIAGNOSIS  Your caregiver will take your medical history and perform a physical exam. A sample of fluid may also be taken from the abscess to determine what is causing your infection. TREATMENT  Your caregiver may prescribe antibiotic medicines to fight the infection. However, taking antibiotics alone usually does not cure an abscess. Your caregiver may need to make a small cut (incision) in the abscess to drain the pus. In some cases, gauze is packed into the abscess to reduce pain and to continue draining the area. HOME CARE INSTRUCTIONS   Only take over-the-counter or prescription medicines for pain, discomfort, or fever as directed by your caregiver.  If you were prescribed antibiotics, take them as directed. Finish them even if you  start to feel better.  If gauze is used, follow your caregiver's directions for changing the gauze.  To avoid spreading the infection:  Keep your draining abscess covered with a bandage.  Wash your hands well.  Do not share personal care items, towels, or whirlpools with others.  Avoid skin contact with others.  Keep your skin and clothes clean around the abscess.  Keep all follow-up appointments as directed by your caregiver. SEEK MEDICAL CARE IF:   You have increased pain, swelling, redness, fluid drainage, or bleeding.  You have muscle aches, chills, or a general ill feeling.  You have a fever. MAKE SURE YOU:   Understand these instructions.  Will watch your condition.  Will get help right away if you are not doing well or get worse.   This information is not intended to replace advice given to you by your health care provider. Make sure you discuss any questions you have with your health care provider.   Document Released: 08/17/2005 Document Revised: 05/08/2012 Document Reviewed: 01/20/2012 Elsevier Interactive Patient Education [employer].

## 2016-05-02 NOTE — Progress Notes (Signed)
Subjective:    Patient ID: Sergio Mack, male    DOB: Nov 09, 1946, 70 y.o.   MRN: [id number]  HPI He is here for an acute visit.   Infected boil:  Over the past couple of weeks he has had three cysts that have been infected.  One was in his left axilla and he was able to get it to drain, but it has stopped and he feels there is still an infection.  He has a similar lesion at the base of his penis that is almost resolved and one in his left nostril.  He has had a low grade fever the past copule of weeks when all this started.  The past couple of days he has had increased fatigue and just does not feel well.    He is taking his medication daily as prescribed.  He does not monitor his BP at home.    Medications and allergies reviewed with patient and updated if appropriate.  Patient Active Problem List   Diagnosis Date Noted  . Prediabetes 04/07/2016  . Full thickness rotator cuff tear 04/23/2014  . Anticoagulation adequate, new on eliquis 03/07/2014  . Paroxysmal atrial fibrillation ([place]) 03/06/2014  . Erectile dysfunction 03/28/2012  . PREMATURE VENTRICULAR CONTRACTIONS, FREQUENT 05/13/2010  . SKIN CANCER, HX OF 03/24/2010  . DIVERTICULOSIS, COLON 12/11/2008  . Hyperglycemia 12/11/2008  . SPINAL STENOSIS 01/14/2008  . BACK PAIN, LUMBAR, WITH RADICULOPATHY 01/02/2008  . HYPERLIPIDEMIA 10/31/2007  . 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 Essential hypertension 10/31/2007  . 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 GERD 09/27/2007    Current Outpatient Prescriptions on File Prior to Visit  Medication Sig Dispense Refill  . apixaban (ELIQUIS) 5 MG TABS tablet Take 1 tablet (5 mg total) by mouth 2 (two) times daily. 60 tablet 11  . Cholecalciferol (VITAMIN D3) 2000 UNITS TABS Take 1 capsule by mouth daily.     . metoprolol tartrate (LOPRESSOR) 25 MG tablet Take 1 tablet (25 mg total) by mouth 2 (two) times daily. 180 tablet 3  . Multiple Vitamin  (MULTIVITAMIN WITH MINERALS) TABS tablet Take 1 tablet by mouth daily.    . quinapril (ACCUPRIL) 40 MG tablet Take 1 tablet (40 mg total) by mouth every morning. 90 tablet 3  . vitamin B-12 (CYANOCOBALAMIN) 1000 MCG tablet Take 1,000 mcg by mouth daily.      No current facility-administered medications on file prior to visit.    Past Medical History  Diagnosis Date  . History of syncope 1994    no etiology; no recurrence  . Hypertension   . Hyperlipidemia     LDL goal = < 100, ideally < 70  . GERD (gastroesophageal reflux disease)   . Erectile dysfunction   . Diverticulosis 2006  . Rotator cuff tear   . History of skin cancer   . PVC's (premature ventricular contractions)   . Anticoagulation adequate, new on eliquis 03/07/2014  . Atrial fibrillation with RVR, new onset, CHA2DS2-VASc score 2- back to SR at discharge 03/06/2014  . Dysrhythmia     Hx A-Fib from 12/2013-new onset- on meds    Past Surgical History  Procedure Laterality Date  . Tonsillectomy and adenoidectomy    . Cystectomy      lip  . Colonoscopy  2006    Diverticulosis  . Spine surgery  2012    Dr Gladstone Lighter  . Cystectomy      anterior thorax-chest  . Inguinal hernia repair  x 2  . Shoulder open rotator cuff repair Right 04/23/2014    Procedure: RIGHT SHOULDER ROTATOR CUFF REPAIR WITH GRAFT AND ANCHORS . OPEN ACROMIONECTOMY;  Surgeon: Tobi Bastos, MD;  Location: [facility];  Service: Orthopedics;  Laterality: Right;    Social History   Social History  . Marital Status: Married    Spouse Name: N/A  . Number of Children: N/A  . Years of Education: N/A   Social History Main Topics  . Smoking status: Former Smoker    Quit date: 04/26/1990  . Smokeless tobacco: Never Used     Comment: smoked [place] with 12 year abstinence ( 21 total years of smoking), up to 1 ppd  . Alcohol Use: No  . Drug Use: No  . Sexual Activity: Not on file   Other Topics Concern  . Not on file   Social History Narrative    No regular exercise    Family History  Problem Relation Age of Onset  . Heart attack Father 99  . Diabetes Mother   . Transient ischemic attack Mother 66  . Diabetes Sister   . Heart attack Sister 44  . Stomach cancer Maternal Grandfather   . Colon cancer Neg Hx   . Colon polyps Neg Hx   . Cirrhosis Paternal Uncle     alcoholic    Review of Systems  Constitutional: Positive for fever and fatigue. Negative for diaphoresis.  Skin: Positive for color change (erythema, tenderness, warm).  Neurological: Negative for light-headedness and headaches.       Objective:   Filed Vitals:   05/02/16 1526  BP: 162/86  Pulse: 82  Temp: 98.5 F (36.9 C)  Resp: 16   Filed Weights   05/02/16 1526  Weight: 206 lb (93.441 kg)   Body mass index is 27.93 kg/(m^2).   Physical Exam  Constitutional: He appears well-developed and well-nourished. No distress.  Genitourinary:  deferred  Skin: Skin is warm and dry. He is not diaphoretic.  Indurated area in left axilla the size of a tangerine that is tender, erythema, no open wound or discharge; no left arm swelling or erythema, 2-3 small lymph nodes in left axilla that are palpable but non-tender          Assessment & Plan:   See Problem List for Assessment and Plan of chronic medical problems.

## 2016-05-02 NOTE — Progress Notes (Signed)
Pre visit review using our clinic review tool, if applicable. No additional management support is needed unless otherwise documented below in the visit note. 

## 2016-05-02 NOTE — Assessment & Plan Note (Signed)
BP elevated here today and at his last visit, but well controlled prior He is reluctant to make any changes to his medication He will start monitoring his BP Has a f/u with cardio next month and will discuss with them

## 2016-05-26 ENCOUNTER — Encounter (HOSPITAL_COMMUNITY): Payer: Self-pay | Admitting: Nurse Practitioner

## 2016-05-26 ENCOUNTER — Ambulatory Visit (HOSPITAL_COMMUNITY)
Admission: RE | Admit: 2016-05-26 | Discharge: 2016-05-26 | Disposition: A | Discharge status: Home / Self Care | Payer: Medicare Other | Source: Ambulatory Visit | Attending: Nurse Practitioner | Admitting: Nurse Practitioner

## 2016-05-26 VITALS — BP 144/84 | HR 66 | Ht 72.0 in | Wt 211.6 lb

## 2016-05-26 DIAGNOSIS — I10 Essential (primary) hypertension: Secondary | ICD-10-CM | POA: Insufficient documentation

## 2016-05-26 DIAGNOSIS — I4510 Unspecified right bundle-branch block: Secondary | ICD-10-CM | POA: Diagnosis not present

## 2016-05-26 DIAGNOSIS — Z7901 Long term (current) use of anticoagulants: Secondary | ICD-10-CM | POA: Diagnosis not present

## 2016-05-26 DIAGNOSIS — I4891 Unspecified atrial fibrillation: Secondary | ICD-10-CM | POA: Diagnosis present

## 2016-05-26 DIAGNOSIS — K219 Gastro-esophageal reflux disease without esophagitis: Secondary | ICD-10-CM | POA: Diagnosis not present

## 2016-05-26 DIAGNOSIS — I491 Atrial premature depolarization: Secondary | ICD-10-CM | POA: Insufficient documentation

## 2016-05-26 DIAGNOSIS — I480 Paroxysmal atrial fibrillation: Secondary | ICD-10-CM | POA: Diagnosis not present

## 2016-05-26 DIAGNOSIS — Z87891 Personal history of nicotine dependence: Secondary | ICD-10-CM | POA: Diagnosis not present

## 2016-05-26 DIAGNOSIS — E785 Hyperlipidemia, unspecified: Secondary | ICD-10-CM | POA: Insufficient documentation

## 2016-05-26 DIAGNOSIS — Z79899 Other long term (current) drug therapy: Secondary | ICD-10-CM | POA: Insufficient documentation

## 2016-05-26 MED ORDER — METOPROLOL TARTRATE 25 MG PO TABS: 25.0000 mg | ORAL_TABLET | Freq: Two times a day (BID) | ORAL | Status: DC

## 2016-05-26 MED ORDER — APIXABAN 5 MG PO TABS: 5.0000 mg | ORAL_TABLET | Freq: Two times a day (BID) | ORAL | Status: DC

## 2016-05-26 MED ORDER — QUINAPRIL HCL 40 MG PO TABS: 40.0000 mg | ORAL_TABLET | Freq: Every morning | ORAL | Status: DC

## 2016-05-26 NOTE — Progress Notes (Signed)
Patient ID: Sergio Mack, male   DOB: 1946/06/20, 70 y.o.   MRN: [id number]      PCP:  Binnie Rail, MD  The patient presents today for electrophysiology followup.  He was seen in May 2015 after a brief episode  of afib around time of shoulder surgery, from which he spontaneously converted to NSR.  Was started on  eliquis and metoprolol without further problems. He had a recent echo and stress test at time of surgery with normal EF and low risk stress test.  He returns for one year visit. Is doing well. NSR with PAC's on EKG. No awareness of afib. No issues  with eliquis, renal function checked in May and stable with creatinine at 0.84 .  Today, he denies symptoms of palpitations, chest pain, shortness of breath, orthopnea, PND, lower extremity edema, dizziness, presyncope, syncope, or neurologic sequela.  The patient feels that he is tolerating medications without difficulties and is otherwise without complaint today.     Past Medical History  Diagnosis Date  . History of syncope 1994    no etiology; no recurrence  . Hypertension   . Hyperlipidemia     LDL goal = < 100, ideally < 70  . GERD (gastroesophageal reflux disease)   . Erectile dysfunction   . Diverticulosis 2006  . Rotator cuff tear   . History of skin cancer   . PVC's (premature ventricular contractions)   . Anticoagulation adequate, new on eliquis 03/07/2014  . Atrial fibrillation with RVR, new onset, CHA2DS2-VASc score 2- back to SR at discharge 03/06/2014  . Dysrhythmia     Hx A-Fib from 12/2013-new onset- on meds   Past Surgical History  Procedure Laterality Date  . Tonsillectomy and adenoidectomy    . Cystectomy      lip  . Colonoscopy  2006    Diverticulosis  . Spine surgery  2012    Dr Gladstone Lighter  . Cystectomy      anterior thorax-chest  . Inguinal hernia repair       x 2  . Shoulder open rotator cuff repair Right 04/23/2014    Procedure: RIGHT SHOULDER ROTATOR CUFF REPAIR WITH GRAFT AND ANCHORS . OPEN  ACROMIONECTOMY;  Surgeon: Tobi Bastos, MD;  Location: [facility];  Service: Orthopedics;  Laterality: Right;    Current Outpatient Prescriptions  Medication Sig Dispense Refill  . apixaban (ELIQUIS) 5 MG TABS tablet Take 1 tablet (5 mg total) by mouth 2 (two) times daily. 60 tablet 11  . Cholecalciferol (VITAMIN D3) 2000 UNITS TABS Take 1 capsule by mouth daily.     . metoprolol tartrate (LOPRESSOR) 25 MG tablet Take 1 tablet (25 mg total) by mouth 2 (two) times daily. 180 tablet 3  . Multiple Vitamin (MULTIVITAMIN WITH MINERALS) TABS tablet Take 1 tablet by mouth daily.    . Multiple Vitamins-Minerals (PRESERVISION AREDS 2 PO) Take by mouth.    . quinapril (ACCUPRIL) 40 MG tablet Take 1 tablet (40 mg total) by mouth every morning. 90 tablet 3  . vitamin B-12 (CYANOCOBALAMIN) 1000 MCG tablet Take 1,000 mcg by mouth daily.      No current facility-administered medications for this encounter.    Allergies  Allergen Reactions  . Shellfish-Derived Products Itching    Itching feet and palms, tightness in chest    Social History   Social History  . Marital Status: Married    Spouse Name: N/A  . Number of Children: N/A  . Years of Education: N/A  Occupational History  . Not on file.   Social History Main Topics  . Smoking status: Former Smoker    Quit date: 04/26/1990  . Smokeless tobacco: Never Used     Comment: smoked [place] with 12 year abstinence ( 21 total years of smoking), up to 1 ppd  . Alcohol Use: No  . Drug Use: No  . Sexual Activity: Not on file   Other Topics Concern  . Not on file   Social History Narrative   No regular exercise    Family History  Problem Relation Age of Onset  . Heart attack Father 47  . Diabetes Mother   . Transient ischemic attack Mother 95  . Diabetes Sister   . Heart attack Sister 41  . Stomach cancer Maternal Grandfather   . Colon cancer Neg Hx   . Colon polyps Neg Hx   . Cirrhosis Paternal Uncle     alcoholic    ROS-   All systems are reviewed and are negative except as outlined in the HPI above  Physical Exam: Filed Vitals:   05/26/16 0954  BP: 144/84  Pulse: 66  Height: 6' (1.829 m)  Weight: 211 lb 9.6 oz (95.981 kg)    GEN- The patient is well appearing, alert and oriented x 3 today.   Head- normocephalic, atraumatic Eyes-  Sclera clear, conjunctiva pink Ears- hearing intact Oropharynx- clear Neck- supple, no JVP Lymph- no cervical lymphadenopathy Lungs- Clear to ausculation bilaterally, normal work of breathing Heart- Regular rate and rhythm, no murmurs, rubs or gallops, PMI not laterally displaced GI- soft, NT, ND, + BS Extremities- no clubbing, cyanosis, or edema MS- no significant deformity or atrophy Skin- no rash or lesion Psych- euthymic mood, full affect Neuro- strength and sensation are intact  Ekg- NSR with PAC's, RBBB, at 56 bpm, pr int 182 ms, QRS int 122 ms, Qtc 408 ms Epic records reviewed   Assessment and Plan:  1. Paroxysmal atrial fibrillation Maintaining NSR Continue metoprolol.  2. Chadsvasc score of at least 2. Continue eliquis, no bleeding issues  3. HTN Stable   Return 1 year  Geroge Baseman. Justen Fonda, [facility] [address] [place], [place] [address] [phone]

## 2016-07-07 DIAGNOSIS — H43811 Vitreous degeneration, right eye: Secondary | ICD-10-CM | POA: Diagnosis not present

## 2016-08-03 DIAGNOSIS — H903 Sensorineural hearing loss, bilateral: Secondary | ICD-10-CM | POA: Diagnosis not present

## 2016-08-17 DIAGNOSIS — H903 Sensorineural hearing loss, bilateral: Secondary | ICD-10-CM | POA: Diagnosis not present

## 2016-08-17 DIAGNOSIS — Z974 Presence of external hearing-aid: Secondary | ICD-10-CM | POA: Diagnosis not present

## 2016-08-17 DIAGNOSIS — H6122 Impacted cerumen, left ear: Secondary | ICD-10-CM | POA: Diagnosis not present

## 2016-09-22 DIAGNOSIS — Z23 Encounter for immunization: Secondary | ICD-10-CM | POA: Diagnosis not present

## 2016-10-10 DIAGNOSIS — N401 Enlarged prostate with lower urinary tract symptoms: Secondary | ICD-10-CM | POA: Diagnosis not present

## 2016-10-19 DIAGNOSIS — N400 Enlarged prostate without lower urinary tract symptoms: Secondary | ICD-10-CM | POA: Diagnosis not present

## 2016-10-19 DIAGNOSIS — N5201 Erectile dysfunction due to arterial insufficiency: Secondary | ICD-10-CM | POA: Diagnosis not present

## 2016-10-27 ENCOUNTER — Telehealth: Payer: Self-pay | Admitting: Internal Medicine

## 2016-10-27 NOTE — Telephone Encounter (Signed)
Pt takes Eliquis for afib with CHADS2 score of 1 (HTN). Ok to hold Eliquis for 3 days prior to procedure, resume within 24 hours if possible. Clearance faxed to Dr Simone Curia office.

## 2016-10-27 NOTE — Telephone Encounter (Signed)
Request for surgical clearance:  1. What type of surgery is being performed? 3 piece inflatable penial prosthesis   2. When is this surgery scheduled? Pending Clearance   3. Are there any medications that need to be held prior to surgery and how long?Eliguis 3 days prior    4. Name of physician performing surgery? Dr.Ottelin    5. What is your office phone and fax number? Office [phone] , [phone]

## 2016-11-16 ENCOUNTER — Other Ambulatory Visit: Payer: Self-pay | Admitting: Urology

## 2016-12-01 ENCOUNTER — Ambulatory Visit (INDEPENDENT_AMBULATORY_CARE_PROVIDER_SITE_OTHER): Payer: Medicare Other | Admitting: Nurse Practitioner

## 2016-12-01 ENCOUNTER — Encounter: Payer: Self-pay | Admitting: Nurse Practitioner

## 2016-12-01 VITALS — BP 124/60 | HR 75 | Temp 98.0°F | Resp 18 | Ht 72.0 in | Wt 216.0 lb

## 2016-12-01 DIAGNOSIS — J0140 Acute pansinusitis, unspecified: Secondary | ICD-10-CM

## 2016-12-01 MED ORDER — BENZONATATE 100 MG PO CAPS: 100.0000 mg | ORAL_CAPSULE | Freq: Three times a day (TID) | ORAL | 0 refills | Status: DC | PRN

## 2016-12-01 MED ORDER — OXYMETAZOLINE HCL 0.05 % NA SOLN: 1.0000 | Freq: Two times a day (BID) | NASAL | 0 refills | Status: DC

## 2016-12-01 MED ORDER — FLUTICASONE PROPIONATE 50 MCG/ACT NA SUSP: 2.0000 | Freq: Every day | NASAL | 0 refills | Status: DC

## 2016-12-01 MED ORDER — DM-GUAIFENESIN ER 30-600 MG PO TB12: 1.0000 | ORAL_TABLET | Freq: Two times a day (BID) | ORAL | 0 refills | Status: DC | PRN

## 2016-12-01 MED ORDER — DOXYCYCLINE MONOHYDRATE 100 MG PO TABS: 100.0000 mg | ORAL_TABLET | Freq: Two times a day (BID) | ORAL | 0 refills | Status: DC

## 2016-12-01 NOTE — Patient Instructions (Signed)
URI Instructions: Flonase and Afrin use: apply 1spray of afrin in each nare, wait 5mins, then apply 2sprays of flonase in each nare. Use both nasal spray consecutively x 3days, then flonase only for at least 14days.  Encourage adequate oral hydration.  Use over-the-counter  "cold" medicines  such as "Tylenol cold" , "Advil cold",  "Mucinex" or" Mucinex D"  for cough and congestion.  Avoid decongestants if you have high blood pressure. Use" Delsym" or" Robitussin" cough syrup varietis for cough.  You can use plain "Tylenol" or "Advi"l for fever, chills and achyness.   

## 2016-12-01 NOTE — Progress Notes (Signed)
Pre visit review using our clinic review tool, if applicable. No additional management support is needed unless otherwise documented below in the visit note. 

## 2016-12-01 NOTE — Progress Notes (Signed)
Subjective:  Patient ID: Sergio Mack, male    DOB: Nov 01, 1946  Age: 71 y.o. MRN: [id number]  CC: Acute Visit (chest congestion, sinus pain x 2 weeks)   Sinusitis  This is a new problem. The current episode started 1 to 4 weeks ago. The problem has been gradually worsening since onset. There has been no fever. Associated symptoms include congestion, coughing, headaches, a hoarse voice, sinus pressure, a sore throat and swollen glands. Pertinent negatives include no chills, ear pain, shortness of breath or sneezing. Past treatments include acetaminophen, lying down and oral decongestants. The treatment provided mild relief.    Outpatient Medications Prior to Visit  Medication Sig Dispense Refill  . apixaban (ELIQUIS) 5 MG TABS tablet Take 1 tablet (5 mg total) by mouth 2 (two) times daily. 60 tablet 11  . cholecalciferol (VITAMIN D) 1000 units tablet Take 1,000 Units by mouth daily.    . Cyanocobalamin (VITAMIN B-12) 5000 MCG SUBL Place 5,000 mcg under the tongue daily.    . metoprolol tartrate (LOPRESSOR) 25 MG tablet Take 1 tablet (25 mg total) by mouth 2 (two) times daily. 180 tablet 3  . Multiple Vitamin (MULTIVITAMIN WITH MINERALS) TABS tablet Take 1 tablet by mouth daily.    . multivitamin-lutein (OCUVITE-LUTEIN) CAPS capsule Take 1 capsule by mouth 2 (two) times daily.    . quinapril (ACCUPRIL) 40 MG tablet Take 1 tablet (40 mg total) by mouth every morning. 90 tablet 3   No facility-administered medications prior to visit.     ROS See HPI  Objective:  BP 124/60   Pulse 75   Temp 98 F (36.7 C) (Oral)   Resp 18   Ht 6' (1.829 m)   Wt 216 lb (98 kg)   SpO2 98%   BMI 29.29 kg/m   BP Readings from Last 3 Encounters:  12/01/16 124/60  05/26/16 (!) 144/84  05/02/16 (!) 162/86    Wt Readings from Last 3 Encounters:  12/01/16 216 lb (98 kg)  05/26/16 211 lb 9.6 oz (96 kg)  05/02/16 206 lb (93.4 kg)    Physical Exam  Constitutional: He is oriented to person,  place, and time. No distress.  HENT:  Right Ear: Tympanic membrane, external ear and ear canal normal.  Left Ear: Tympanic membrane and ear canal normal.  Nose: Mucosal edema and rhinorrhea present. Right sinus exhibits maxillary sinus tenderness and frontal sinus tenderness. Left sinus exhibits maxillary sinus tenderness and frontal sinus tenderness.  Mouth/Throat: Uvula is midline. Posterior oropharyngeal erythema present. No oropharyngeal exudate.  Eyes: No scleral icterus.  Neck: Normal range of motion. Neck supple.  Cardiovascular: Normal rate and regular rhythm.   Pulmonary/Chest: Effort normal and breath sounds normal.  Lymphadenopathy:    He has cervical adenopathy.  Neurological: He is alert and oriented to person, place, and time.  Vitals reviewed.   Lab Results  Component Value Date   WBC 10.7 (H) 04/05/2016   HGB 14.7 04/05/2016   HCT 43.3 04/05/2016   PLT 305.0 04/05/2016   GLUCOSE 116 (H) 04/05/2016   CHOL 126 04/05/2016   TRIG 55.0 04/05/2016   HDL 32.00 (L) 04/05/2016   LDLCALC 83 04/05/2016   ALT 22 04/05/2016   AST 23 04/05/2016   NA 129 (L) 04/05/2016   K 5.0 04/05/2016   CL 95 (L) 04/05/2016   CREATININE 0.84 04/05/2016   BUN 6 04/05/2016   CO2 26 04/05/2016   TSH 1.16 04/05/2016   PSA 0.56 03/24/2010   INR  1.01 04/23/2014   HGBA1C 6.3 04/05/2016    No results found.  Assessment & Plan:   Sergio was seen today for acute visit.  Diagnoses and all orders for this visit:  Acute non-recurrent pansinusitis -     fluticasone (FLONASE) 50 MCG/ACT nasal spray; Place 2 sprays into both nostrils daily. -     oxymetazoline (AFRIN NASAL SPRAY) 0.05 % nasal spray; Place 1 spray into both nostrils 2 (two) times daily. Use only for 3days, then stop -     dextromethorphan-guaiFENesin (MUCINEX DM) 30-600 MG 12hr tablet; Take 1 tablet by mouth 2 (two) times daily as needed for cough. -     benzonatate (TESSALON) 100 MG capsule; Take 1 capsule (100 mg total) by  mouth 3 (three) times daily as needed for cough. -     doxycycline (ADOXA) 100 MG tablet; Take 1 tablet (100 mg total) by mouth 2 (two) times daily.   I am having Mr. Mack start on fluticasone, oxymetazoline, dextromethorphan-guaiFENesin, benzonatate, and doxycycline. I am also having him maintain his multivitamin with minerals, apixaban, metoprolol tartrate, quinapril, multivitamin-lutein, cholecalciferol, and Vitamin B-12.  Meds ordered this encounter  Medications  . fluticasone (FLONASE) 50 MCG/ACT nasal spray    Sig: Place 2 sprays into both nostrils daily.    Dispense:  16 g    Refill:  0    Order Specific Question:   Supervising Provider    Answer:   Cassandria Anger [[id number]]  . oxymetazoline (AFRIN NASAL SPRAY) 0.05 % nasal spray    Sig: Place 1 spray into both nostrils 2 (two) times daily. Use only for 3days, then stop    Dispense:  30 mL    Refill:  0    Order Specific Question:   Supervising Provider    Answer:   Cassandria Anger [[id number]]  . dextromethorphan-guaiFENesin (MUCINEX DM) 30-600 MG 12hr tablet    Sig: Take 1 tablet by mouth 2 (two) times daily as needed for cough.    Dispense:  14 tablet    Refill:  0    Order Specific Question:   Supervising Provider    Answer:   Cassandria Anger [[id number]]  . benzonatate (TESSALON) 100 MG capsule    Sig: Take 1 capsule (100 mg total) by mouth 3 (three) times daily as needed for cough.    Dispense:  20 capsule    Refill:  0    Order Specific Question:   Supervising Provider    Answer:   Cassandria Anger [[id number]]  . doxycycline (ADOXA) 100 MG tablet    Sig: Take 1 tablet (100 mg total) by mouth 2 (two) times daily.    Dispense:  14 tablet    Refill:  0    Order Specific Question:   Supervising Provider    Answer:   Cassandria Anger [[id number]]    Follow-up: Return if symptoms worsen or fail to improve.  Wilfred Lacy, NP

## 2016-12-02 NOTE — Patient Instructions (Addendum)
Sergio Mack  12/02/2016   Your procedure is scheduled on: Friday 12/09/2016  Report to [facility] Main  Entrance take [place]  elevators to 3rd floor to  [place] at   [place]  AM.  Call this number if you have problems the morning of surgery [phone]   Remember: ONLY 1 PERSON MAY GO WITH YOU TO SHORT STAY TO GET  READY MORNING OF [place].   Do not eat food or drink liquids :After Midnight.     Take these medicines the morning of surgery with A SIP OF WATER: Metoprolol, Flonase nasal spray                                 You may not have any metal on your body including hair pins and              piercings  Do not wear jewelry, make-up, lotions, powders or perfumes, deodorant             Do not wear nail polish.  Do not shave  48 hours prior to surgery.              Men may shave face and neck.   Do not bring valuables to the hospital. [place].  Contacts, dentures or bridgework may not be worn into surgery.  Leave suitcase in the car. After surgery it may be brought to your room.                 Please read over the following fact sheets you were given: _____________________________________________________________________             [facility] - Preparing for Surgery Before surgery, you can play an important role.  Because skin is not sterile, your skin needs to be as free of germs as possible.  You can reduce the number of germs on your skin by washing with CHG (chlorahexidine gluconate) soap before surgery.  CHG is an antiseptic cleaner which kills germs and bonds with the skin to continue killing germs even after washing. Please DO NOT use if you have an allergy to CHG or antibacterial soaps.  If your skin becomes reddened/irritated stop using the CHG and inform your nurse when you arrive at Short Stay. Do not shave (including legs and underarms) for at least 48 hours prior to the  first CHG shower.  You may shave your face/neck. Please follow these instructions carefully:  1.  Shower with CHG Soap the night before surgery and the  morning of Surgery.  2.  If you choose to wash your hair, wash your hair first as usual with your  normal  shampoo.  3.  After you shampoo, rinse your hair and body thoroughly to remove the  shampoo.                           4.  Use CHG as you would any other liquid soap.  You can apply chg directly  to the skin and wash                       Gently with a scrungie or clean  washcloth.  5.  Apply the CHG Soap to your body ONLY FROM THE NECK DOWN.   Do not use on face/ open                           Wound or open sores. Avoid contact with eyes, ears mouth and genitals (private parts).                       Wash face,  Genitals (private parts) with your normal soap.             6.  Wash thoroughly, paying special attention to the area where your surgery  will be performed.  7.  Thoroughly rinse your body with warm water from the neck down.  8.  DO NOT shower/wash with your normal soap after using and rinsing off  the CHG Soap.                9.  Pat yourself dry with a clean towel.            10.  Wear clean pajamas.            11.  Place clean sheets on your bed the night of your first shower and do not  sleep with pets. Day of Surgery : Do not apply any lotions/deodorants the morning of surgery.  Please wear clean clothes to the hospital/surgery center.  FAILURE TO FOLLOW THESE INSTRUCTIONS MAY RESULT IN THE CANCELLATION OF YOUR SURGERY PATIENT SIGNATURE_________________________________  NURSE SIGNATURE__________________________________  ________________________________________________________________________

## 2016-12-05 ENCOUNTER — Encounter (HOSPITAL_COMMUNITY)
Admission: RE | Admit: 2016-12-05 | Discharge: 2016-12-05 | Disposition: A | Discharge status: Home / Self Care | Payer: Medicare Other | Source: Ambulatory Visit | Attending: Urology | Admitting: Urology

## 2016-12-05 ENCOUNTER — Ambulatory Visit (HOSPITAL_COMMUNITY)
Admission: RE | Admit: 2016-12-05 | Discharge: 2016-12-05 | Disposition: A | Discharge status: Home / Self Care | Payer: Medicare Other | Source: Ambulatory Visit | Attending: Anesthesiology | Admitting: Anesthesiology

## 2016-12-05 ENCOUNTER — Encounter (HOSPITAL_COMMUNITY): Payer: Self-pay

## 2016-12-05 ENCOUNTER — Encounter: Payer: Self-pay | Admitting: Internal Medicine

## 2016-12-05 DIAGNOSIS — I4510 Unspecified right bundle-branch block: Secondary | ICD-10-CM | POA: Insufficient documentation

## 2016-12-05 DIAGNOSIS — E119 Type 2 diabetes mellitus without complications: Secondary | ICD-10-CM | POA: Insufficient documentation

## 2016-12-05 DIAGNOSIS — Z01818 Encounter for other preprocedural examination: Secondary | ICD-10-CM | POA: Diagnosis not present

## 2016-12-05 DIAGNOSIS — N529 Male erectile dysfunction, unspecified: Secondary | ICD-10-CM | POA: Diagnosis not present

## 2016-12-05 DIAGNOSIS — R05 Cough: Secondary | ICD-10-CM | POA: Diagnosis not present

## 2016-12-05 DIAGNOSIS — R9431 Abnormal electrocardiogram [ECG] [EKG]: Secondary | ICD-10-CM | POA: Insufficient documentation

## 2016-12-05 DIAGNOSIS — Z01811 Encounter for preprocedural respiratory examination: Secondary | ICD-10-CM

## 2016-12-05 DIAGNOSIS — Z01812 Encounter for preprocedural laboratory examination: Secondary | ICD-10-CM | POA: Diagnosis not present

## 2016-12-05 LAB — BASIC METABOLIC PANEL
ANION GAP: 7 (ref 5–15)
BUN: 6 mg/dL (ref 6–20)
CO2: 26 mmol/L (ref 22–32)
Calcium: 8.7 mg/dL — ABNORMAL LOW (ref 8.9–10.3)
Chloride: 97 mmol/L — ABNORMAL LOW (ref 101–111)
Creatinine, Ser: 0.84 mg/dL (ref 0.61–1.24)
GLUCOSE: 98 mg/dL (ref 65–99)
POTASSIUM: 4.1 mmol/L (ref 3.5–5.1)
SODIUM: 130 mmol/L — AB (ref 135–145)

## 2016-12-05 LAB — CBC
HEMATOCRIT: 40.6 % (ref 39.0–52.0)
HEMOGLOBIN: 13.5 g/dL (ref 13.0–17.0)
MCH: 28 pg (ref 26.0–34.0)
MCHC: 33.3 g/dL (ref 30.0–36.0)
MCV: 84.2 fL (ref 78.0–100.0)
Platelets: 346 10*3/uL (ref 150–400)
RBC: 4.82 MIL/uL (ref 4.22–5.81)
RDW: 13.5 % (ref 11.5–15.5)
WBC: 12.5 10*3/uL — AB (ref 4.0–10.5)

## 2016-12-08 MED ORDER — GENTAMICIN SULFATE 40 MG/ML IJ SOLN
5.0000 mg/kg | INTRAVENOUS | Status: AC
  Administered 2016-12-09: 430 mg via INTRAVENOUS
  Filled 2016-12-08 (×2): qty 10.75

## 2016-12-08 NOTE — Discharge Instructions (Signed)
Penile prosthesis postoperative instructions ° °Wound: ° °In most cases your incision will have absorbable sutures that will dissolve within the first 10-20 days. Some will fall out even earlier. Expect some redness as the sutures dissolved but this should occur only around the sutures. If there is generalized redness, especially with increasing pain or swelling, let us know. The scrotum and penis will very likely get "black and blue" as the blood in the tissues spread. Sometimes the whole scrotum will turn colors. The black and blue is followed by a yellow and brown color. In time, all the discoloration will go away. In some cases some firm swelling in the area of the testicle and pump may persist for up to 4-6 weeks after the surgery and is considered normal in most cases. ° °Diet: ° °You may return to your normal diet within 24 hours following your surgery. You may note some mild nausea and possibly vomiting the first 6-8 hours following surgery. This is usually due to the side effects of anesthesia, and will disappear quite soon. I would suggest clear liquids and a very light meal the first evening following your surgery. ° °Activity: ° °Your physical activity should be restricted the first 48 hours. During that time you should remain relatively inactive, moving about only when necessary. During the first 7-10 days following surgery he should avoid lifting any heavy objects (anything greater than 15 pounds), and avoid strenuous exercise. If you work, ask us specifically about your restrictions, both for work and home. We will write a note to your employer if needed. ° °You should plan to wear a tight pair of jockey shorts or an athletic supporter for the first 4-5 days, even to sleep. This will keep the scrotum immobilized to some degree and keep the swelling down.The position of your penis will determine what is most comfortable but I strongly urge you to keep the penis in the "up" position (toward your  head). ° °Ice packs should be placed on and off over the scrotum for the first 48 hours. Frozen peas or corn in a ZipLock bag can be frozen, used and re-frozen. Fifteen minutes on and 15 minutes off is a reasonable schedule. The ice is a good pain reliever and keeps the swelling down. ° °Hygiene: ° °You may shower 48 hours after your surgery. Tub bathing should be restricted until the seventh day. ° °Medication: ° °You will be sent home with some type of pain medication. In many cases you will be sent home with a narcotic pain pill (hydrocodone or oxycodone). If the pain is not too bad, you may take either Tylenol (acetaminophen) or Advil (ibuprofen) which contain no narcotic agents, and might be tolerated a little better, with fewer side effects. If the pain medication you are sent home with does not control the pain, you will have to let us know. Some narcotic pain medications cannot be given or refilled by a phone call to a pharmacy. ° °Problems you should report to us: ° °· Fever of 101.0 degrees Fahrenheit or greater. °· Moderate or severe swelling under the skin incision or involving the scrotum. °Drug reaction such as hives, a rash, nausea or vomiting. °

## 2016-12-08 NOTE — H&P (Signed)
HPI: Sergio Mack is a 71 year-old male established patient who is here for a prostate examination.  He has had a previous prostate examination. His most recent prostate examination was 10/14/2015. He has had annual prostate examinations. He has been told that he has prostate enlargement. He has not been told that he has a prostate nodule. He has had a PSA done. His PSA has always been normal. He has not had a prostate biopsy. He has not been treated with medications in the past for his lower urinary tract symptoms. The patient has never had a surgical procedure for bladder outlet obstruction to his prostate.   He does not have pain with urination. He has not had recurrent prostate infections or chronic prostatitis. He has not seen blood in his urine.   Erectile dysfunction: He has tried phosphodiesterase inhibitors including Levitra and Cialis. The reason he is not taking phosphodiesterase inhibitors is because of expense. He has tried intracavernosal injections and does not prefer this. He indicated an interest in penile prosthesis implantation in the past. He does take Eliquis for atrial fibrillation.  Current therapy: Sildenafil    He also mentioned that he is ready to proceed with penile prosthesis implantation.     ALLERGIES: No Allergies Shellfish    MEDICATIONS: Eliquis 5 MG Oral Tablet Oral  Fish Oil CAPS Oral  Metoprolol Tartrate 25 MG Oral Tablet Oral  Multi-Vitamin TABS Oral  PreserVision/Lutein CAPS Oral  Sildenafil Citrate 20 MG Oral Tablet 0 Oral  Vitamin D TABS Oral     GU PSH: None     PSH Notes: Inguinal Hernia Repair, Inguinal Hernia Repair, Back Surgery   NON-GU PSH: None   GU PMH: Hematospermia, Hematospermia - 10/14/2015 BPH w/LUTS, Benign localized prostatic hyperplasia with lower urinary tract symptoms (LUTS) - 09/14/2015 ED, arterial insufficiency, Erectile dysfunction due to arterial insufficiency - 2015      PMH Notes: Hematospermia: 11/16.  Hematospermia on Eliquis. No abnormality was noted on exam. His PSA was normal.   Erectile dysfunction: He has tried phosphodiesterase inhibitors including Levitra and Cialis. The reason he is not taking phosphodiesterase inhibitors is because of expense. He has tried intracavernosal injections and does not prefer this. He has indicated an interest in penile prosthesis implantation in the past. He does take Eliquis for atrial fibrillation.  Current therapy: Sildenafil    NON-GU PMH: Personal history of other diseases of the circulatory system, History of atrial fibrillation - 2015, History of hypertension, - 2014 Encounter for general adult medical examination without abnormal findings, Encounter for preventive health examination - 2015 Personal history of other specified conditions, History of heartburn - 2014    FAMILY HISTORY: Family Health Status Number - Runs In Family Father Deceased At Age67 ___ - Runs In Family Mother Deceased At Age 62 from diabetic complicati - Runs In Family No pertinent family history - Other   SOCIAL HISTORY: Marital Status: Married Current Smoking Status: Patient does not smoke anymore.  Has never drank.  Drinks 4+ caffeinated drinks per day.     Notes: Former smoker, Tobacco use, Alcohol Use, Caffeine Use, Marital History - Currently Married   REVIEW OF SYSTEMS:    GU Review Male:   Patient reports erection problems. Patient denies frequent urination, hard to postpone urination, burning/ pain with urination, get up at night to urinate, leakage of urine, stream starts and stops, trouble starting your stream, have to strain to urinate , and penile pain.  Gastrointestinal (Upper):   Patient denies nausea,  vomiting, and indigestion/ heartburn.  Gastrointestinal (Lower):   Patient denies diarrhea and constipation.  Constitutional:   Patient denies fever, night sweats, weight loss, and fatigue.  Skin:   Patient denies skin rash/ lesion and itching.  Eyes:    Patient denies blurred vision and double vision.  Ears/ Nose/ Throat:   Patient denies sore throat and sinus problems.  Hematologic/Lymphatic:   Patient denies swollen glands and easy bruising.  Cardiovascular:   Patient denies leg swelling and chest pains.  Respiratory:   Patient denies cough and shortness of breath.  Endocrine:   Patient denies excessive thirst.  Musculoskeletal:   Patient denies back pain and joint pain.  Neurological:   Patient denies headaches and dizziness.  Psychologic:   Patient denies depression and anxiety.   VITAL SIGNS:      Weight 207 lb / 93.89 kg  Height 72 in / 182.88 cm  BP 146/92 mmHg  Pulse 90 /min  BMI 28.1 kg/m   GU PHYSICAL EXAMINATION:    Anus and Perineum: No hemorrhoids. No anal stenosis. No rectal fissure, no anal fissure. No edema, no dimple, no perineal tenderness, no anal tenderness.  Prostate: Prostate 1 1/2+ size. Left lobe normal consistency, right lobe normal consistency. Symmetrical lobes. No prostate nodule. Left lobe no tenderness, right lobe no tenderness.   Seminal Vesicles: Nonpalpable.  Sphincter Tone: Normal sphincter. No rectal tenderness. No rectal mass.    MULTI-SYSTEM PHYSICAL EXAMINATION: Physical Exam  Constitutional: Well nourished and well developed . No acute distress.   ENT:. The ears and nose are normal in appearance.   Neck: The appearance of the neck is normal and no neck mass is present.   Pulmonary: No respiratory distress and normal respiratory rhythm and effort.   Cardiovascular: Heart rate and rhythm are normal . No peripheral edema.   Abdomen: The abdomen is soft and nontender. No masses are palpated. No CVA tenderness. No hernias are palpable.  Left inguinal hernia scar, right inguinal hernia scar. No hepatosplenomegaly noted.   Lymphatics: The femoral and inguinal nodes are not enlarged or tender.   Skin: Normal skin turgor, no visible rash and no visible skin lesions.   Neuro/Psych:. Mood and  affect are appropriate.     PAST DATA REVIEWED:  Source Of History:  Patient  Lab Test Review:   PSA, BUN/Creatinine  Records Review:   Previous Patient Records, POC Tool   10/10/16 09/15/15 04/11/12  PSA  Total PSA 0.62 ng/dl 0.62  0.78     04/11/12  Hormones  Testosterone, Total 494.54    Notes:                     His creatinine in 5/17 was normal at 0.84.   PROCEDURES:          Urinalysis Dipstick Dipstick Cont'd  Color: Yellow Bilirubin: Neg  Appearance: Clear Ketones: Neg  Specific Gravity: 1.010 Blood: Neg  pH: 6.5 Protein: Neg  Glucose: Neg Urobilinogen: 0.2    Nitrites: Neg    Leukocyte Esterase: Neg    ASSESSMENT:      ICD-10 Details  1 GU:   BPH w/o LUTS - N40.0 Stable - His prostate, although slightly enlarged, is not causing any difficulty with urination. He has no nodularity or induration noted on DRE and his PSA remains low and stable at 0.62. My recommendation is yearly DRE and PSA.  2   ED, arterial insufficiency  I discussed semirigid and three-piece inflatable penile prosthesis as  treatment options. He is on Eliquis for atrial fibrillation but said he has discussed this with his cardiologist who told him that he would be able to come off the Eliquis for surgery if necessary. He has undergone 2 previous hernia operations. They were both open surgeries he believes with mesh. I therefore have discussed penile prosthesis implantation surgery and first went over the scrotal incision and used and in his case the need for a second incision was highly possible in order to implant the reservoir. We discussed the risks and complications associated with prosthesis implantation as well as the fact that if the device did become infected most likely need to be removed completely. I went over the probability of success as well as the need for overnight stay in the hospital and anticipated postoperative course. In addition written information and a DVD from [place].  We  discussed proceeding with prosthesis implantation. He will need to be off his Eliquis for 3 days prior to the surgery. In addition he has had bilateral inguinal herniorrhaphies and therefore will need to have his reservoir placed ectopically. I told him this may require a second incision.          Notes:   We again discussed the procedure and abdominal over the incision used, the risks and complications, the need for an overnight stay in the hospital as well as the anticipated postoperative course.    PLAN: 3 piece inflatable penile prosthesis implantation with ectopic reservoir placement.

## 2016-12-09 ENCOUNTER — Ambulatory Visit (HOSPITAL_COMMUNITY): Payer: Medicare Other | Admitting: Certified Registered Nurse Anesthetist

## 2016-12-09 ENCOUNTER — Encounter (HOSPITAL_COMMUNITY)
Admission: RE | Disposition: A | Discharge status: Home / Self Care | Payer: Self-pay | Source: Ambulatory Visit | Attending: Urology

## 2016-12-09 ENCOUNTER — Encounter (HOSPITAL_COMMUNITY): Payer: Self-pay | Admitting: *Deleted

## 2016-12-09 ENCOUNTER — Ambulatory Visit (HOSPITAL_COMMUNITY)
Admission: RE | Admit: 2016-12-09 | Discharge: 2016-12-10 | Disposition: A | Discharge status: Home / Self Care | Payer: Medicare Other | Source: Ambulatory Visit | Attending: Urology | Admitting: Urology

## 2016-12-09 DIAGNOSIS — I10 Essential (primary) hypertension: Secondary | ICD-10-CM | POA: Insufficient documentation

## 2016-12-09 DIAGNOSIS — Z87891 Personal history of nicotine dependence: Secondary | ICD-10-CM | POA: Diagnosis not present

## 2016-12-09 DIAGNOSIS — I4891 Unspecified atrial fibrillation: Secondary | ICD-10-CM | POA: Diagnosis not present

## 2016-12-09 DIAGNOSIS — Z79899 Other long term (current) drug therapy: Secondary | ICD-10-CM | POA: Insufficient documentation

## 2016-12-09 DIAGNOSIS — Z7901 Long term (current) use of anticoagulants: Secondary | ICD-10-CM | POA: Diagnosis not present

## 2016-12-09 DIAGNOSIS — N401 Enlarged prostate with lower urinary tract symptoms: Secondary | ICD-10-CM | POA: Diagnosis not present

## 2016-12-09 DIAGNOSIS — K219 Gastro-esophageal reflux disease without esophagitis: Secondary | ICD-10-CM | POA: Diagnosis not present

## 2016-12-09 DIAGNOSIS — N529 Male erectile dysfunction, unspecified: Secondary | ICD-10-CM

## 2016-12-09 DIAGNOSIS — N5201 Erectile dysfunction due to arterial insufficiency: Secondary | ICD-10-CM | POA: Insufficient documentation

## 2016-12-09 DIAGNOSIS — E785 Hyperlipidemia, unspecified: Secondary | ICD-10-CM | POA: Diagnosis not present

## 2016-12-09 HISTORY — PX: PENILE PROSTHESIS IMPLANT: SHX240

## 2016-12-09 SURGERY — INSERTION, PENILE PROSTHESIS, INFLATABLE
Anesthesia: General

## 2016-12-09 MED ORDER — CEFAZOLIN SODIUM-DEXTROSE 2-4 GM/100ML-% IV SOLN
2.0000 g | INTRAVENOUS | Status: AC
  Administered 2016-12-09: 2 g via INTRAVENOUS
  Filled 2016-12-09: qty 100

## 2016-12-09 MED ORDER — OXYCODONE HCL 5 MG/5ML PO SOLN
5.0000 mg | Freq: Once | ORAL | Status: DC | PRN
  Filled 2016-12-09: qty 5

## 2016-12-09 MED ORDER — OXYCODONE HCL 5 MG PO TABS: 5.0000 mg | ORAL_TABLET | Freq: Once | ORAL | Status: DC | PRN

## 2016-12-09 MED ORDER — DEXAMETHASONE SODIUM PHOSPHATE 10 MG/ML IJ SOLN
INTRAMUSCULAR | Status: DC | PRN
  Administered 2016-12-09: 10 mg via INTRAVENOUS

## 2016-12-09 MED ORDER — FENTANYL CITRATE (PF) 100 MCG/2ML IJ SOLN: 25.0000 ug | INTRAMUSCULAR | Status: DC | PRN

## 2016-12-09 MED ORDER — ONDANSETRON HCL 4 MG/2ML IJ SOLN: 4.0000 mg | Freq: Once | INTRAMUSCULAR | Status: DC | PRN

## 2016-12-09 MED ORDER — ONDANSETRON HCL 4 MG/2ML IJ SOLN: 4.0000 mg | INTRAMUSCULAR | Status: DC | PRN

## 2016-12-09 MED ORDER — FENTANYL CITRATE (PF) 100 MCG/2ML IJ SOLN
INTRAMUSCULAR | Status: DC | PRN
  Administered 2016-12-09 (×4): 25 ug via INTRAVENOUS
  Administered 2016-12-09: 50 ug via INTRAVENOUS

## 2016-12-09 MED ORDER — LACTATED RINGERS IV SOLN
INTRAVENOUS | Status: DC | PRN
  Administered 2016-12-09 (×2): via INTRAVENOUS

## 2016-12-09 MED ORDER — PROPOFOL 10 MG/ML IV BOLUS
INTRAVENOUS | Status: AC
  Filled 2016-12-09: qty 20

## 2016-12-09 MED ORDER — EPHEDRINE SULFATE 50 MG/ML IJ SOLN
INTRAMUSCULAR | Status: DC | PRN
  Administered 2016-12-09 (×3): 5 mg via INTRAVENOUS

## 2016-12-09 MED ORDER — BACITRACIN 500 UNIT/GM EX OINT
TOPICAL_OINTMENT | CUTANEOUS | Status: DC | PRN
  Administered 2016-12-09: 1 via TOPICAL

## 2016-12-09 MED ORDER — SODIUM CHLORIDE 0.9 % IV SOLN
INTRAVENOUS | Status: DC
  Administered 2016-12-09 – 2016-12-10 (×3): via INTRAVENOUS

## 2016-12-09 MED ORDER — OXYBUTYNIN CHLORIDE 5 MG PO TABS
5.0000 mg | ORAL_TABLET | Freq: Once | ORAL | Status: AC
  Administered 2016-12-09: 5 mg via ORAL
  Filled 2016-12-09: qty 1

## 2016-12-09 MED ORDER — MIDAZOLAM HCL 2 MG/2ML IJ SOLN
INTRAMUSCULAR | Status: AC
  Filled 2016-12-09: qty 2

## 2016-12-09 MED ORDER — OXYCODONE HCL 10 MG PO TABS: 10.0000 mg | ORAL_TABLET | ORAL | 0 refills | Status: DC | PRN

## 2016-12-09 MED ORDER — LIDOCAINE HCL 1 % IJ SOLN
INTRAMUSCULAR | Status: DC | PRN
  Administered 2016-12-09: 60 mg via INTRADERMAL

## 2016-12-09 MED ORDER — FENTANYL CITRATE (PF) 100 MCG/2ML IJ SOLN
INTRAMUSCULAR | Status: AC
  Filled 2016-12-09: qty 2

## 2016-12-09 MED ORDER — OXYCODONE HCL 5 MG PO TABS
5.0000 mg | ORAL_TABLET | ORAL | Status: DC | PRN
  Administered 2016-12-09 – 2016-12-10 (×4): 5 mg via ORAL
  Filled 2016-12-09 (×4): qty 1

## 2016-12-09 MED ORDER — CEFAZOLIN SODIUM-DEXTROSE 2-4 GM/100ML-% IV SOLN
2.0000 g | Freq: Four times a day (QID) | INTRAVENOUS | Status: AC
  Administered 2016-12-09 (×2): 2 g via INTRAVENOUS
  Filled 2016-12-09 (×2): qty 100

## 2016-12-09 MED ORDER — SODIUM CHLORIDE 0.9 % IR SOLN
Status: DC | PRN
  Administered 2016-12-09: 1000 mL

## 2016-12-09 MED ORDER — ONDANSETRON HCL 4 MG/2ML IJ SOLN
INTRAMUSCULAR | Status: DC | PRN
Start: 2016-12-09 — End: 2016-12-09
  Administered 2016-12-09: 4 mg via INTRAVENOUS

## 2016-12-09 MED ORDER — CHLORHEXIDINE GLUCONATE 4 % EX LIQD: Freq: Once | CUTANEOUS | Status: DC

## 2016-12-09 MED ORDER — FENTANYL CITRATE (PF) 100 MCG/2ML IJ SOLN
25.0000 ug | INTRAMUSCULAR | Status: DC | PRN
  Administered 2016-12-09 (×2): 50 ug via INTRAVENOUS

## 2016-12-09 MED ORDER — CEFAZOLIN SODIUM-DEXTROSE 2-4 GM/100ML-% IV SOLN
INTRAVENOUS | Status: AC
  Filled 2016-12-09: qty 100

## 2016-12-09 MED ORDER — BUPIVACAINE HCL (PF) 0.5 % IJ SOLN
INTRAMUSCULAR | Status: AC
  Filled 2016-12-09: qty 30

## 2016-12-09 MED ORDER — EPHEDRINE 5 MG/ML INJ
INTRAVENOUS | Status: AC
  Filled 2016-12-09: qty 10

## 2016-12-09 MED ORDER — DEXAMETHASONE SODIUM PHOSPHATE 10 MG/ML IJ SOLN
INTRAMUSCULAR | Status: AC
  Filled 2016-12-09: qty 1

## 2016-12-09 MED ORDER — AMOXICILLIN-POT CLAVULANATE 875-125 MG PO TABS: 1.0000 | ORAL_TABLET | Freq: Two times a day (BID) | ORAL | 0 refills | Status: DC

## 2016-12-09 MED ORDER — MIDAZOLAM HCL 5 MG/5ML IJ SOLN
INTRAMUSCULAR | Status: DC | PRN
  Administered 2016-12-09: 2 mg via INTRAVENOUS

## 2016-12-09 MED ORDER — HYDROMORPHONE HCL 1 MG/ML IJ SOLN: 0.5000 mg | INTRAMUSCULAR | Status: DC | PRN

## 2016-12-09 MED ORDER — PROPOFOL 10 MG/ML IV BOLUS
INTRAVENOUS | Status: DC | PRN
  Administered 2016-12-09: 150 mg via INTRAVENOUS

## 2016-12-09 MED ORDER — LIDOCAINE 2% (20 MG/ML) 5 ML SYRINGE
INTRAMUSCULAR | Status: AC
  Filled 2016-12-09: qty 5

## 2016-12-09 MED ORDER — SODIUM CHLORIDE 0.9 % IR SOLN
Status: AC
  Filled 2016-12-09 (×2): qty 500000

## 2016-12-09 MED ORDER — 0.9 % SODIUM CHLORIDE (POUR BTL) OPTIME
TOPICAL | Status: DC | PRN
  Administered 2016-12-09: 1000 mL

## 2016-12-09 MED ORDER — ACETAMINOPHEN 10 MG/ML IV SOLN
1000.0000 mg | Freq: Four times a day (QID) | INTRAVENOUS | Status: AC
  Administered 2016-12-09 – 2016-12-10 (×4): 1000 mg via INTRAVENOUS
  Filled 2016-12-09 (×4): qty 100

## 2016-12-09 MED ORDER — ONDANSETRON HCL 4 MG/2ML IJ SOLN
INTRAMUSCULAR | Status: AC
  Filled 2016-12-09: qty 2

## 2016-12-09 MED ORDER — BACITRACIN ZINC 500 UNIT/GM EX OINT
TOPICAL_OINTMENT | CUTANEOUS | Status: AC
  Filled 2016-12-09: qty 28.35

## 2016-12-09 SURGICAL SUPPLY — 49 items
APL SKNCLS STERI-STRIP NONHPOA (GAUZE/BANDAGES/DRESSINGS)
BAG URINE DRAINAGE (UROLOGICAL SUPPLIES) ×2 IMPLANT
BANDAGE COBAN STERILE 2 (GAUZE/BANDAGES/DRESSINGS) IMPLANT
BENZOIN TINCTURE PRP APPL 2/3 (GAUZE/BANDAGES/DRESSINGS) IMPLANT
BLADE HEX COATED 2.75 (ELECTRODE) ×2 IMPLANT
BLADE SURG 15 STRL LF DISP TIS (BLADE) ×2 IMPLANT
BLADE SURG 15 STRL SS (BLADE) ×4
BNDG GAUZE ELAST 4 BULKY (GAUZE/BANDAGES/DRESSINGS) ×2 IMPLANT
CATH FOLEY 2WAY SLVR  5CC 16FR (CATHETERS) ×1
CATH FOLEY 2WAY SLVR 5CC 16FR (CATHETERS) ×1 IMPLANT
CHLORAPREP W/TINT 26ML (MISCELLANEOUS) IMPLANT
COVER MAYO STAND STRL (DRAPES) ×2 IMPLANT
COVER SURGICAL LIGHT HANDLE (MISCELLANEOUS) IMPLANT
DISSECTOR ROUND CHERRY 3/8 STR (MISCELLANEOUS) ×2 IMPLANT
DRAPE EXTREMITY T 121X128X90 (DRAPE) ×2 IMPLANT
DRAPE INCISE IOBAN 66X45 STRL (DRAPES) IMPLANT
DRSG TEGADERM 4X4.75 (GAUZE/BANDAGES/DRESSINGS) IMPLANT
ELECT REM PT RETURN 9FT ADLT (ELECTROSURGICAL) ×2
ELECTRODE REM PT RTRN 9FT ADLT (ELECTROSURGICAL) ×1 IMPLANT
GAUZE SPONGE 4X4 12PLY STRL (GAUZE/BANDAGES/DRESSINGS) ×2 IMPLANT
GLOVE BIOGEL M 8.0 STRL (GLOVE) ×4 IMPLANT
GOWN STRL REUS W/TWL XL LVL3 (GOWN DISPOSABLE) ×2 IMPLANT
KIT ASSEMBLY MENTOR (KITS) ×2 IMPLANT
KIT BASIN OR (CUSTOM PROCEDURE TRAY) ×2 IMPLANT
KIT TITAN ASSEMBLY (Erectile Restoration) ×1 IMPLANT
KIT TITAN ASSEMBLY STANDARD (Erectile Restoration) ×1 IMPLANT
KIT TITAN ASSEMBLY STD (Erectile Restoration) ×1 IMPLANT
LUBRICANT JELLY ST 5GR 8946 (MISCELLANEOUS) ×2 IMPLANT
NEEDLE HYPO 22GX1.5 SAFETY (NEEDLE) ×2 IMPLANT
NS IRRIG 1000ML POUR BTL (IV SOLUTION) IMPLANT
PACK GENERAL/GYN (CUSTOM PROCEDURE TRAY) ×2 IMPLANT
PLUG CATH AND CAP STER (CATHETERS) ×2 IMPLANT
PROS TITAN SCROT 0 ANG 16CM (ERECTILE RESTORATION) ×2
PROSTHESIS TTN SCRO 0 ANG 16CM (ERECTILE RESTORATION) ×1 IMPLANT
RESERVOIR TITAN 12.5CC W/VLV ×2 IMPLANT
RETRACTOR WILSON SYSTEM (INSTRUMENTS) ×2 IMPLANT
SCRUB TECHNI CARE 4 OZ NO DYE (MISCELLANEOUS) IMPLANT
SPONGE LAP 4X18 X RAY DECT (DISPOSABLE) IMPLANT
STRIP CLOSURE SKIN 1/2X4 (GAUZE/BANDAGES/DRESSINGS) IMPLANT
SUPPORT SCROTAL LG STRP (MISCELLANEOUS) IMPLANT
SUT CHROMIC 3 0 SH 27 (SUTURE) ×4 IMPLANT
SUT MNCRL AB 4-0 PS2 18 (SUTURE) ×2 IMPLANT
SUT PDS AB 2-0 CT2 27 (SUTURE) ×8 IMPLANT
SUT VIC AB 2-0 UR6 27 (SUTURE) ×4 IMPLANT
SYR 20CC LL (SYRINGE) ×4 IMPLANT
SYR 50ML LL SCALE MARK (SYRINGE) ×4 IMPLANT
SYR CONTROL 10ML LL (SYRINGE) ×2 IMPLANT
TOWEL OR 17X26 10 PK STRL BLUE (TOWEL DISPOSABLE) ×4 IMPLANT
WATER STERILE IRR 500ML POUR (IV SOLUTION) ×2 IMPLANT

## 2016-12-09 NOTE — Progress Notes (Signed)
Night of surgery note  Subjective: The patient is doing well.  No complaints other than a feeling of needing to urinate despite having a Foley catheter in place.  Objective: Vital signs in last 24 hours: Temp:  [97.5 F (36.4 C)-98.2 F (36.8 C)] 98.1 F (36.7 C) (01/19 1028) Pulse Rate:  [56-72] 61 (01/19 1028) Resp:  [10-20] 16 (01/19 1028) BP: (135-180)/(66-88) 139/66 (01/19 1028) SpO2:  [98 %-100 %] 100 % (01/19 1028) Weight:  [213 lb (96.6 kg)] 213 lb (96.6 kg) (01/19 0517)  Intake/Output this shift: Total I/O In: 1400 [I.V.:1400] Out: 425 [Urine:400; Blood:25]  Physical Exam:  General: Alert and oriented. Abdomen: Soft, Nondistended. Incision: With fluff dressing in place and intact.  Catheter draining clear urine.  Lab Results: No results for input(s): HGB, HCT in the last 72 hours.  Assessment/Plan: 1) Continue to monitor 2) Per orders   Gwenetta Devos C. Karsten Ro, [place] C 12/09/2016, 12:50 PM

## 2016-12-09 NOTE — Op Note (Signed)
PATIENT:  Sergio Mack  PRE-OPERATIVE DIAGNOSIS:  Organic erectile dysfunction  POST-OPERATIVE DIAGNOSIS:  Same  PROCEDURE:  Procedure(s): 3 piece inflatable in all prosthesis ([employer])  Device: Titan prosthesis, 16 cm cylinders with 1 cm rear-tip extenders and 125 mL reservoir.  SURGEON:  Claybon Jabs  INDICATION: He has had long-standing organic erectile dysfunction and has been treated with, unsuccessfully, with oral agents and intracavernosal injection therapy. He has elected to proceed with prosthesis implantation.  ANESTHESIA:  General  EBL:  Minimal  LOCAL MEDICATIONS USED:  None  SPECIMEN: None  DISPOSITION OF SPECIMEN:  N/A  Description of procedure: The patient was taken to the major operating room, placed on the table and administered general anesthesia in the supine position. His genitalia was then scrubbed for 10 minutes, painted and then sterilely draped. An official timeout was then performed.  A 16 French Foley catheter was placed in the bladder and the bladder was drained and the catheter was plugged. A midline median raphae scrotal incision was then made and then blunt dissection was used to expose the corpus cavernosum on the right and left sides. This was further cleared of overlying tissue using sharp technique. 2-0 PDS sutures were then placed in the corpus cavernosum to service stay sutures and then an incision was then made in the corpus cavernosum first on the left-hand side with the knife and then extended proximally and distally with the curved Mayo scissors. I then dilated the corpus cavernosum with the Hegar dilators starting at 10 and progressing to 13 proximally and distally. I found that dilating distally he had a lot of scar tissue primarily more on the right than the left but it was difficult to dilate although a out to the tip of the corpus cavernosum on both sides. I used Metzenbaum scissors to try to open this area up but there was a great deal of  scar tissue. The tip of the Metzenbaum scissors did seem to progress all the way to the tip of the corpus cavernosum but getting the dilators all the way out to that level was difficult. I then irrigated the corpus cavernosum with antibiotic solution and measured the distance proximally and distally from the stay suture and was found to be 10 distally and 9 cm proximally. I then turned my attention to the contralateral corpus cavernosum and placed my stay sutures, made my corporotomy and dilated the corpus cavernosum in an identical fashion. This was measured and also was found to be identical. It was irrigated with antibiotic solution as was the scrotum. I then chose an 16 cm cylinder set with 1 cm rear-tip extenders and these were prepped while I prepared the site for reservoir placement. I chose a shorter cylinder due to the inability to comfortably dilate all the way to the tip of the corpus cavernosum on each side.  I used blunt technique to dissect up to the external inguinal ring on the right-hand side. I swept the spermatic cord laterally and then was able to bluntly dissect with my index finger through the cephalad aspect of the inguinal ring. I drained the bladder completely with a Foley catheter and then developed a space ectopically for the reservoir. I remained extraperitoneal. I irrigated the space with antibiotic solution and then placed the 125 mL reservoir in this location. I then filled the reservoir with 75 cc of sterile saline.  Attention was redirected to the corporotomies where the cylinders were then placed by first fixing the suture to the  distal aspect of the right cylinder to a straight needle. This was then loaded on the [facility] inserter and passed through the corporotomy and distally. I then advanced the straight needle with the Furlow inserter out through the glans and this was grasped with a hemostat and pulled through the glans and the suture was secured with a hemostat. I then  performed an identical maneuver on the contralateral side. After this was performed I irrigated both corpus cavernosum and inserted the distal portion of the cylinder through the corporotomies and pulled this to the end of the corpora with the suture. The proximal aspect with the rear-tip extender was then passed through the corporotomy and into the seated position on each side. I then connected reservoir tubing to a syringe filled with sterile saline and inflated the device. I noted a good straight erection with both cylinders equidistant under the glans and no buckling of the cylinders. I therefore deflated the device and closed the corporotomies with running 2-0 PDS suture. The cylinder was then connected to the pump after excising the excess tubing with appropriate shodded hemostats in place and then I used the supplied connectors to make the connection. I then again cycled the device with the pump and it cycled properly. I also noted that with the cylinders inflated they were equidistant but seemed to possibly be be somewhat proximal to the glans. I deflated the device and pumped it up about three quarters of the way to aid with hemostasis. I irrigated the scrotum once again with antibiotic solution.  I then developed the scrotal pocket in the right hemiscrotum in place the pump in this location. I irrigated the wound one last time with antibiotic irrigation and then closed the deep scrotal tissue over the tubing and pump with running 3-0 chromic suture. A second layer was then closed over this first layer with running 3-0 chromic and then the skin was closed with running 3-0 chromic. Antibiotic ointment, a 4 x 4 and a Tegaderm were then applied to the scrotum. I then wrapped the scrotum and penis with a Curlex. The catheter was connected to closed system drainage and the patient was awakened and taken recovery room in stable and satisfactory condition. He tolerated the procedure well and there were no  intraoperative complications. Needle sponge and instrument counts were correct at the end of the operation.  PLAN OF CARE: He will be observed overnight with intravenous antibiotics and pain medication with plan for discharge in the a.m.   PATIENT DISPOSITION:  PACU - hemodynamically stable.

## 2016-12-09 NOTE — Anesthesia Procedure Notes (Signed)
Procedure Name: LMA Insertion Date/Time: 12/09/2016 7:38 AM Performed by: Gaston Islam EVETTE Pre-anesthesia Checklist: Patient identified, Suction available, Patient being monitored and Emergency Drugs available Patient Re-evaluated:Patient Re-evaluated prior to inductionOxygen Delivery Method: Circle system utilized Preoxygenation: Pre-oxygenation with 100% oxygen Intubation Type: IV induction Ventilation: Mask ventilation without difficulty LMA: LMA inserted LMA Size: 5.0 Grade View: Grade I Number of attempts: 1 Airway Equipment and Method: Patient positioned with wedge pillow Placement Confirmation: positive ETCO2,  CO2 detector and breath sounds checked- equal and bilateral Tube secured with: Tape Dental Injury: Teeth and Oropharynx as per pre-operative assessment

## 2016-12-09 NOTE — Transfer of Care (Signed)
Immediate Anesthesia Transfer of Care Note  Patient: Sergio Mack  Procedure(s) Performed: Procedure(s): PENILE PROTHESIS [place] (N/A)  Patient Location: PACU  Anesthesia Type:General  Level of Consciousness: awake, alert  and oriented  Airway & Oxygen Therapy: Patient connected to face mask oxygen  Post-op Assessment: Report given to RN and Post -op Vital signs reviewed and stable  Post vital signs: stable  Last Vitals:  Vitals:   12/09/16 0517  BP: (!) 180/88  Pulse: 72  Resp: 20  Temp: 36.8 C    Last Pain:  Vitals:   12/09/16 0524  TempSrc:   PainSc: 0-No pain      Patients Stated Pain Goal: 4 ([phone] [id number])  Complications: No apparent anesthesia complications

## 2016-12-09 NOTE — Anesthesia Postprocedure Evaluation (Addendum)
Anesthesia Post Note  Patient: Sergio Mack  Procedure(s) Performed: Procedure(s) (LRB): PENILE PROTHESIS INFLATABLE THREE PIECE [place] (N/A)  Patient location during evaluation: PACU Anesthesia Type: General Level of consciousness: awake, awake and alert and oriented Pain management: pain level controlled Vital Signs Assessment: post-procedure vital signs reviewed and stable Respiratory status: spontaneous breathing, nonlabored ventilation and respiratory function stable Cardiovascular status: blood pressure returned to baseline Anesthetic complications: no       Last Vitals:  Vitals:   12/09/16 1015 12/09/16 1028  BP: 139/66 139/66  Pulse: 61 61  Resp: 12 16  Temp: 36.5 C 36.7 C    Last Pain:  Vitals:   12/09/16 1304  TempSrc:   PainSc: 0-No pain                 Coltan Spinello COKER

## 2016-12-09 NOTE — Anesthesia Preprocedure Evaluation (Addendum)
Anesthesia Evaluation  Patient identified by MRN, date of birth, ID band Patient awake    Reviewed: Allergy & Precautions, NPO status , Patient's Chart, lab work & pertinent test results  Airway Mallampati: II  TM Distance: >3 FB Neck ROM: Full    Dental  (+) Partial Upper, Partial Lower   Pulmonary former smoker,    breath sounds clear to auscultation       Cardiovascular hypertension,  Rhythm:Regular Rate:Normal     Neuro/Psych    GI/Hepatic   Endo/Other    Renal/GU      Musculoskeletal   Abdominal   Peds  Hematology   Anesthesia Other Findings   Reproductive/Obstetrics                            Anesthesia Physical Anesthesia Plan  ASA: III  Anesthesia Plan: General   Post-op Pain Management:    Induction: Intravenous  Airway Management Planned: LMA  Additional Equipment:   Intra-op Plan:   Post-operative Plan:   Informed Consent: I have reviewed the patients History and Physical, chart, labs and discussed the procedure including the risks, benefits and alternatives for the proposed anesthesia with the patient or authorized representative who has indicated his/her understanding and acceptance.   Dental advisory given  Plan Discussed with: CRNA and Anesthesiologist  Anesthesia Plan Comments:        Anesthesia Quick Evaluation

## 2016-12-10 DIAGNOSIS — I4891 Unspecified atrial fibrillation: Secondary | ICD-10-CM | POA: Diagnosis not present

## 2016-12-10 DIAGNOSIS — N5201 Erectile dysfunction due to arterial insufficiency: Secondary | ICD-10-CM | POA: Diagnosis not present

## 2016-12-10 DIAGNOSIS — Z79899 Other long term (current) drug therapy: Secondary | ICD-10-CM | POA: Diagnosis not present

## 2016-12-10 DIAGNOSIS — I10 Essential (primary) hypertension: Secondary | ICD-10-CM | POA: Diagnosis not present

## 2016-12-10 DIAGNOSIS — N401 Enlarged prostate with lower urinary tract symptoms: Secondary | ICD-10-CM | POA: Diagnosis not present

## 2016-12-10 DIAGNOSIS — Z7901 Long term (current) use of anticoagulants: Secondary | ICD-10-CM | POA: Diagnosis not present

## 2016-12-10 NOTE — Discharge Summary (Signed)
Physician Discharge Summary  Patient ID: Sergio Mack MRN: [id number] DOB/AGE: [birth year] 71 y.o.  Admit date: 12/09/2016 Discharge date: 12/10/2016  Admission Diagnoses: ERECTILE DYSFUNCTION  Discharge Diagnoses:  Active Problems:   Organic erectile dysfunction   Discharged Condition: good  Hospital Course: He underwent elective inflatable penile prosthesis implantation. There were no complications. Postoperatively he was noted be doing well. Overnight he had no difficulties. Specifically no fever and is not having any nausea or vomiting. He is tolerating regular diet. His dressing is dry and intact. His catheter is draining clear urine. He is felt ready for discharge at this time.  Significant Diagnostic Studies: No results found.  Discharge Exam: Blood pressure (!) 111/57, pulse (!) 57, temperature 97.5 F (36.4 C), temperature source Oral, resp. rate 16, height 6' (1.829 m), weight 213 lb (96.6 kg), SpO2 98 %.  Abdomen is flat, soft and nontender. Genitalia reveal the dressing is intact and in place with no obvious drainage. The glans appears viable. The catheter is draining clear urine.   Disposition: 06-Home-Health Care Svc he will be discharged home. I have instructed him to hold his Eliquis for 48 hours and then begin this medication.  Discharge Instructions    Discharge    Complete by:  As directed    Foley catheter - discontinue    Complete by:  As directed      Allergies as of 12/10/2016      Reactions   Glucosamine Itching   Itching feet and palms, tightness in chest   Shellfish-derived Products Itching   Itching feet and palms, tightness in chest      Medication List    TAKE these medications   amoxicillin-clavulanate 875-125 MG tablet Commonly known as:  AUGMENTIN Take 1 tablet by mouth 2 (two) times daily.   apixaban 5 MG Tabs tablet Commonly known as:  ELIQUIS Take 1 tablet (5 mg total) by mouth 2 (two) times daily.   benzonatate 100 MG  capsule Commonly known as:  TESSALON Take 1 capsule (100 mg total) by mouth 3 (three) times daily as needed for cough.   cholecalciferol 1000 units tablet Commonly known as:  VITAMIN D Take 1,000 Units by mouth daily.   dextromethorphan-guaiFENesin 30-600 MG 12hr tablet Commonly known as:  MUCINEX DM Take 1 tablet by mouth 2 (two) times daily as needed for cough.   doxycycline 100 MG tablet Commonly known as:  ADOXA Take 1 tablet (100 mg total) by mouth 2 (two) times daily.   fluticasone 50 MCG/ACT nasal spray Commonly known as:  FLONASE Place 2 sprays into both nostrils daily.   metoprolol tartrate 25 MG tablet Commonly known as:  LOPRESSOR Take 1 tablet (25 mg total) by mouth 2 (two) times daily.   multivitamin with minerals Tabs tablet Take 1 tablet by mouth daily.   multivitamin-lutein Caps capsule Take 1 capsule by mouth 2 (two) times daily.   Oxycodone HCl 10 MG Tabs Take 1 tablet (10 mg total) by mouth every 4 (four) hours as needed.   oxymetazoline 0.05 % nasal spray Commonly known as:  AFRIN NASAL SPRAY Place 1 spray into both nostrils 2 (two) times daily. Use only for 3days, then stop   quinapril 40 MG tablet Commonly known as:  ACCUPRIL Take 1 tablet (40 mg total) by mouth every morning.   Vitamin B-12 5000 MCG Subl Place 5,000 mcg under the tongue daily.      [facility] On 12/16/2016.  Why:  For your appiontment at 10:45 Contact information: [place] [address] [phone]           Signed: Claybon Jabs 12/10/2016, 7:32 AM

## 2016-12-12 ENCOUNTER — Encounter (HOSPITAL_COMMUNITY): Payer: Self-pay | Admitting: Urology

## 2016-12-22 DIAGNOSIS — L821 Other seborrheic keratosis: Secondary | ICD-10-CM | POA: Diagnosis not present

## 2016-12-22 DIAGNOSIS — D1801 Hemangioma of skin and subcutaneous tissue: Secondary | ICD-10-CM | POA: Diagnosis not present

## 2016-12-22 DIAGNOSIS — L814 Other melanin hyperpigmentation: Secondary | ICD-10-CM | POA: Diagnosis not present

## 2016-12-22 DIAGNOSIS — D235 Other benign neoplasm of skin of trunk: Secondary | ICD-10-CM | POA: Diagnosis not present

## 2017-01-13 DIAGNOSIS — N5201 Erectile dysfunction due to arterial insufficiency: Secondary | ICD-10-CM | POA: Diagnosis not present

## 2017-04-06 ENCOUNTER — Encounter: Payer: BLUE CROSS/BLUE SHIELD | Admitting: Internal Medicine

## 2017-04-22 NOTE — Progress Notes (Signed)
Subjective:    Patient ID: Sergio Mack, male    DOB: July 28, 1946, 71 y.o.   MRN: [id number]  HPI He is here for follow up.   Foot cramping;  He gets foot cramping when he sits in his chair at night. It happens frequently.  He think it cramps more on the weekend after being more active and possibly more dehydrated.    Prediabetes:  He is not compliant with a low sugar/carbohydrate diet.  He is active but not exercising regularly.  Headaches:  For the past month he has had 1-2 headaches a week and usually does not get headaches.  There is no pattern to the headaches.    Hypertension: He is taking his medication daily. He is compliant with a low sodium diet.  He denies chest pain, palpitations, edema. He is active, but not exercising regularly.  He does not monitor his blood pressure at home.    Hyperlipidemia: He is taking his medication daily. He is compliant with a low fat/cholesterol diet. He is not exercising regularly. He denies myalgias.    Medications and allergies reviewed with patient and updated if appropriate.  Patient Active Problem List   Diagnosis Date Noted  . 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 Infected sebaceous cyst of skin 05/02/2016  . Prediabetes 04/07/2016  . Full thickness rotator cuff tear 04/23/2014  . Anticoagulation adequate, new on eliquis 03/07/2014  . Paroxysmal atrial fibrillation ([place]) 03/06/2014  . Erectile dysfunction 03/28/2012  . PREMATURE VENTRICULAR CONTRACTIONS, FREQUENT 05/13/2010  . SKIN CANCER, HX OF 03/24/2010  . DIVERTICULOSIS, COLON 12/11/2008  . Hyperglycemia 12/11/2008  . SPINAL STENOSIS 01/14/2008  . BACK PAIN, LUMBAR, WITH RADICULOPATHY 01/02/2008  . HYPERLIPIDEMIA 10/31/2007  . 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 Essential hypertension 10/31/2007  . 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 GERD 09/27/2007    Current Outpatient Prescriptions on File Prior to Visit  Medication Sig  Dispense Refill  . apixaban (ELIQUIS) 5 MG TABS tablet Take 1 tablet (5 mg total) by mouth 2 (two) times daily. 60 tablet 11  . cholecalciferol (VITAMIN D) 1000 units tablet Take 1,000 Units by mouth daily.    . Cyanocobalamin (VITAMIN B-12) 5000 MCG SUBL Place 5,000 mcg under the tongue daily.    [place] Kitchen doxycycline (ADOXA) 100 MG tablet Take 1 tablet (100 mg total) by mouth 2 (two) times daily. 14 tablet 0  . metoprolol tartrate (LOPRESSOR) 25 MG tablet Take 1 tablet (25 mg total) by mouth 2 (two) times daily. 180 tablet 3  . Multiple Vitamin (MULTIVITAMIN WITH MINERALS) TABS tablet Take 1 tablet by mouth daily.    . multivitamin-lutein (OCUVITE-LUTEIN) CAPS capsule Take 1 capsule by mouth 2 (two) times daily.    . quinapril (ACCUPRIL) 40 MG tablet Take 1 tablet (40 mg total) by mouth every morning. 90 tablet 3   No current facility-administered medications on file prior to visit.     Past Medical History:  Diagnosis Date  . Anticoagulation adequate, new on eliquis 03/07/2014  . Atrial fibrillation with RVR, new onset, CHA2DS2-VASc score 2- back to SR at discharge 03/06/2014  . Diverticulosis 2006  . Dysrhythmia    Hx A-Fib from 12/2013-new onset- on meds  . Erectile dysfunction   . GERD (gastroesophageal reflux disease)   . History of skin cancer   . History of syncope 1994   no etiology; no recurrence  . Hyperlipidemia    LDL goal = < 100, ideally <  55  . Hypertension   . PVC's (premature ventricular contractions)   . Rotator cuff tear     Past Surgical History:  Procedure Laterality Date  . COLONOSCOPY  2006   Diverticulosis  . CYSTECTOMY     lip  . CYSTECTOMY     anterior thorax-chest  . INGUINAL HERNIA REPAIR      x 2  . 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 SHOULDER OPEN ROTATOR CUFF REPAIR Right 04/23/2014   Procedure:  RIGHT SHOULDER ROTATOR CUFF REPAIR WITH GRAFT AND ANCHORS . OPEN ACROMIONECTOMY;  Surgeon: Tobi Bastos, MD;  Location: [facility];  Service: Orthopedics;  Laterality: Right;  . SPINE SURGERY  2012   Dr Gladstone Lighter  . TONSILLECTOMY AND ADENOIDECTOMY      Social History   Social History  . Marital status: Married    Spouse name: N/A  . Number of children: N/A  . Years of education: N/A   Social History Main Topics  . Smoking status: Former Smoker    Quit date: 04/26/1990  . Smokeless tobacco: Former Systems developer     Comment: smoked [place] with 12 year abstinence ( 21 total years of smoking), up to 1 ppd  . Alcohol use No  . Drug use: No  . Sexual activity: Not on file   Other Topics Concern  . Not on file   Social History Narrative   No regular exercise    Family History  Problem Relation Age of Onset  . Heart attack Father 98  . Diabetes Mother   . Transient ischemic attack Mother 22  . Diabetes Sister   . Heart attack Sister 82  . Stomach cancer Maternal Grandfather   . Cirrhosis Paternal Uncle        alcoholic  . Colon cancer Neg Hx   . Colon polyps Neg Hx     Review of Systems  Constitutional: Positive for fatigue (low energy level). Negative for chills and fever.  HENT: Positive for sinus pressure.   Respiratory: Positive for shortness of breath (with strenuous actvitiy). Negative for cough and wheezing.   Cardiovascular: Negative for chest pain, palpitations and leg swelling.  Gastrointestinal: Negative for abdominal pain.       GERD occ  Neurological: Positive for headaches (1-2 a week - frontal/sinus). Negative for dizziness and light-headedness.       Objective:   Vitals:   04/24/17 0917  BP: 112/70  Pulse: 75  Resp: 16  Temp: 97.9 F (36.6 C)   Filed Weights   04/24/17 0917  Weight: 196 lb (88.9 kg)   Body mass index is 26.58 kg/m.  Wt Readings from Last 3 Encounters:  04/24/17 196 lb (88.9 kg)  12/09/16 213 lb (96.6 kg)  12/05/16 213 lb 9.6  oz (96.9 kg)     Physical Exam Constitutional: Appears well-developed and well-nourished. No distress.  HENT:  Head: Normocephalic and atraumatic.  Neck: Neck supple. No tracheal deviation present. No thyromegaly present.  No cervical lymphadenopathy Cardiovascular: Normal rate, regular rhythm and normal heart sounds.   No murmur heard. No carotid bruit .  No edema Pulmonary/Chest: Effort normal and breath sounds normal. No respiratory distress. No has no wheezes. No rales.  Skin: Skin is warm and dry. Not diaphoretic.  Psychiatric: Normal mood and affect. Behavior is normal.  Assessment & Plan:    See Problem List for Assessment and Plan of chronic medical problems.

## 2017-04-24 ENCOUNTER — Other Ambulatory Visit (INDEPENDENT_AMBULATORY_CARE_PROVIDER_SITE_OTHER): Payer: Medicare Other

## 2017-04-24 ENCOUNTER — Ambulatory Visit (INDEPENDENT_AMBULATORY_CARE_PROVIDER_SITE_OTHER): Payer: Medicare Other | Admitting: Internal Medicine

## 2017-04-24 ENCOUNTER — Encounter: Payer: Self-pay | Admitting: Internal Medicine

## 2017-04-24 VITALS — BP 112/70 | HR 75 | Temp 97.9°F | Resp 16 | Ht 72.0 in | Wt 196.0 lb

## 2017-04-24 DIAGNOSIS — I10 Essential (primary) hypertension: Secondary | ICD-10-CM | POA: Diagnosis not present

## 2017-04-24 DIAGNOSIS — I480 Paroxysmal atrial fibrillation: Secondary | ICD-10-CM

## 2017-04-24 DIAGNOSIS — R252 Cramp and spasm: Secondary | ICD-10-CM

## 2017-04-24 DIAGNOSIS — R519 Headache, unspecified: Secondary | ICD-10-CM | POA: Insufficient documentation

## 2017-04-24 DIAGNOSIS — R51 Headache: Secondary | ICD-10-CM | POA: Diagnosis not present

## 2017-04-24 DIAGNOSIS — E782 Mixed hyperlipidemia: Secondary | ICD-10-CM

## 2017-04-24 DIAGNOSIS — R7303 Prediabetes: Secondary | ICD-10-CM

## 2017-04-24 LAB — COMPREHENSIVE METABOLIC PANEL
ALT: 18 U/L (ref 0–53)
AST: 21 U/L (ref 0–37)
Albumin: 4.3 g/dL (ref 3.5–5.2)
Alkaline Phosphatase: 87 U/L (ref 39–117)
BUN: 8 mg/dL (ref 6–23)
CHLORIDE: 97 meq/L (ref 96–112)
CO2: 27 mEq/L (ref 19–32)
Calcium: 9.4 mg/dL (ref 8.4–10.5)
Creatinine, Ser: 1.03 mg/dL (ref 0.40–1.50)
GFR: 75.52 mL/min (ref >60.00)
GLUCOSE: 120 mg/dL — AB (ref 70–99)
POTASSIUM: 5 meq/L (ref 3.5–5.1)
SODIUM: 129 meq/L — AB (ref 135–145)
TOTAL PROTEIN: 7.3 g/dL (ref 6.0–8.3)
Total Bilirubin: 0.6 mg/dL (ref 0.2–1.2)

## 2017-04-24 LAB — LIPID PANEL
CHOL/HDL RATIO: 3
Cholesterol: 112 mg/dL (ref 0–200)
HDL: 36.5 mg/dL — AB (ref >39.00)
LDL CALC: 58 mg/dL (ref 0–99)
NonHDL: 75.45
TRIGLYCERIDES: 85 mg/dL (ref 0.0–149.0)
VLDL: 17 mg/dL (ref 0.0–40.0)

## 2017-04-24 LAB — CBC WITH DIFFERENTIAL/PLATELET
BASOS ABS: 0.1 10*3/uL (ref 0.0–0.1)
Basophils Relative: 0.9 % (ref 0.0–3.0)
EOS ABS: 0.1 10*3/uL (ref 0.0–0.7)
Eosinophils Relative: 0.9 % (ref 0.0–5.0)
HCT: 47.1 % (ref 39.0–52.0)
Hemoglobin: 15.7 g/dL (ref 13.0–17.0)
LYMPHS ABS: 2.3 10*3/uL (ref 0.7–4.0)
Lymphocytes Relative: 19 % (ref 12.0–46.0)
MCHC: 33.3 g/dL (ref 30.0–36.0)
MCV: 83.9 fl (ref 78.0–100.0)
MONO ABS: 0.9 10*3/uL (ref 0.1–1.0)
Monocytes Relative: 7.8 % (ref 3.0–12.0)
NEUTROS ABS: 8.6 10*3/uL — AB (ref 1.4–7.7)
NEUTROS PCT: 71.4 % (ref 43.0–77.0)
PLATELETS: 302 10*3/uL (ref 150.0–400.0)
RBC: 5.62 Mil/uL (ref 4.22–5.81)
RDW: 15 % (ref 11.5–15.5)
WBC: 12 10*3/uL — ABNORMAL HIGH (ref 4.0–10.5)

## 2017-04-24 LAB — HEMOGLOBIN A1C: Hgb A1c MFr Bld: 6.2 % (ref 4.6–6.5)

## 2017-04-24 LAB — TSH: TSH: 1.05 u[IU]/mL (ref 0.35–4.50)

## 2017-04-24 LAB — MAGNESIUM: Magnesium: 2.2 mg/dL (ref 1.5–2.5)

## 2017-04-24 NOTE — Assessment & Plan Note (Signed)
?   Dehydration Increase water intake when more active on the weekends Check labs

## 2017-04-24 NOTE — Patient Instructions (Addendum)
  Test(s) ordered today. Your results will be released to [place] (or called to you) after review, usually within 72hours after test completion. If any changes need to be made, you will be notified at that same time.  All other Health Maintenance issues reviewed.   All recommended immunizations and age-appropriate screenings are up-to-date or discussed.  No immunizations administered today. Consider shingrix.   Medications reviewed and updated.  No changes recommended at this time.   Please followup in 6 months

## 2017-04-24 NOTE — Assessment & Plan Note (Signed)
Lipid, cmp Increase exercise

## 2017-04-24 NOTE — Assessment & Plan Note (Signed)
2/ week, mild - has not needed to take any medication for it Possibly sinus related - may need allergy medication Discussed possible causes - he will monitor and see if there is a pattern Tylenol prn

## 2017-04-24 NOTE — Assessment & Plan Note (Signed)
Check a1c Low sugar / carb diet Stressed regular exercise, weight loss  

## 2017-04-24 NOTE — Assessment & Plan Note (Signed)
BP well controlled Current regimen effective and well tolerated Continue current medications at current doses  

## 2017-04-24 NOTE — Assessment & Plan Note (Signed)
Rate controlled On eliquis, metoprolol Following with cardiology

## 2017-04-29 ENCOUNTER — Encounter: Payer: Self-pay | Admitting: Internal Medicine

## 2017-06-01 ENCOUNTER — Encounter (HOSPITAL_COMMUNITY): Payer: Self-pay | Admitting: Nurse Practitioner

## 2017-06-01 ENCOUNTER — Ambulatory Visit (HOSPITAL_COMMUNITY)
Admission: RE | Admit: 2017-06-01 | Discharge: 2017-06-01 | Disposition: A | Discharge status: Home / Self Care | Payer: Medicare Other | Source: Ambulatory Visit | Attending: Nurse Practitioner | Admitting: Nurse Practitioner

## 2017-06-01 VITALS — BP 142/80 | HR 66 | Ht 72.0 in | Wt 198.0 lb

## 2017-06-01 DIAGNOSIS — Z8249 Family history of ischemic heart disease and other diseases of the circulatory system: Secondary | ICD-10-CM | POA: Diagnosis not present

## 2017-06-01 DIAGNOSIS — I10 Essential (primary) hypertension: Secondary | ICD-10-CM | POA: Diagnosis not present

## 2017-06-01 DIAGNOSIS — Z833 Family history of diabetes mellitus: Secondary | ICD-10-CM | POA: Diagnosis not present

## 2017-06-01 DIAGNOSIS — E785 Hyperlipidemia, unspecified: Secondary | ICD-10-CM | POA: Insufficient documentation

## 2017-06-01 DIAGNOSIS — Z87891 Personal history of nicotine dependence: Secondary | ICD-10-CM | POA: Diagnosis not present

## 2017-06-01 DIAGNOSIS — I480 Paroxysmal atrial fibrillation: Secondary | ICD-10-CM

## 2017-06-01 DIAGNOSIS — Z888 Allergy status to other drugs, medicaments and biological substances status: Secondary | ICD-10-CM | POA: Insufficient documentation

## 2017-06-01 DIAGNOSIS — Z85828 Personal history of other malignant neoplasm of skin: Secondary | ICD-10-CM | POA: Diagnosis not present

## 2017-06-01 DIAGNOSIS — Z79899 Other long term (current) drug therapy: Secondary | ICD-10-CM | POA: Diagnosis not present

## 2017-06-01 DIAGNOSIS — Z91013 Allergy to seafood: Secondary | ICD-10-CM | POA: Insufficient documentation

## 2017-06-01 DIAGNOSIS — K219 Gastro-esophageal reflux disease without esophagitis: Secondary | ICD-10-CM | POA: Diagnosis not present

## 2017-06-01 DIAGNOSIS — Z7901 Long term (current) use of anticoagulants: Secondary | ICD-10-CM | POA: Insufficient documentation

## 2017-06-01 MED ORDER — APIXABAN 5 MG PO TABS: 5.0000 mg | ORAL_TABLET | Freq: Two times a day (BID) | ORAL | 3 refills | Status: DC

## 2017-06-01 MED ORDER — QUINAPRIL HCL 40 MG PO TABS: 40.0000 mg | ORAL_TABLET | Freq: Every morning | ORAL | 3 refills | Status: DC

## 2017-06-01 MED ORDER — METOPROLOL TARTRATE 25 MG PO TABS: 25.0000 mg | ORAL_TABLET | Freq: Two times a day (BID) | ORAL | 3 refills | Status: DC

## 2017-06-01 NOTE — Progress Notes (Signed)
Patient ID: Sergio Mack, male   DOB: 01-25-1946, 71 y.o.   MRN: [id number]      PCP:  Binnie Rail, MD  The patient presents today for electrophysiology followup.  He was seen in May 2015 after a brief episode  of afib around time of shoulder surgery, from which he spontaneously converted to NSR.  Was started on  eliquis and metoprolol without further problems. He had a recent echo and stress test at time of surgery with normal EF and low risk stress test.  He returns for one year visit, 7/12. Is doing well. NSR with PAC's on EKG. No awareness of afib. No issues  with eliquis, renal function checked in June 2018 and stable with creatinine at 1.03.   Today, he denies symptoms of palpitations, chest pain, shortness of breath, orthopnea, PND, lower extremity edema, dizziness, presyncope, syncope, or neurologic sequela.  The patient feels that he is tolerating medications without difficulties and is otherwise without complaint today.     Past Medical History:  Diagnosis Date  . Anticoagulation adequate, new on eliquis 03/07/2014  . Atrial fibrillation with RVR, new onset, CHA2DS2-VASc score 2- back to SR at discharge 03/06/2014  . Diverticulosis 2006  . Dysrhythmia    Hx A-Fib from 12/2013-new onset- on meds  . Erectile dysfunction   . GERD (gastroesophageal reflux disease)   . History of skin cancer   . History of syncope 1994   no etiology; no recurrence  . Hyperlipidemia    LDL goal = < 100, ideally < 70  . Hypertension   . PVC's (premature ventricular contractions)   . Rotator cuff tear    Past Surgical History:  Procedure Laterality Date  . COLONOSCOPY  2006   Diverticulosis  . CYSTECTOMY     lip  . CYSTECTOMY     anterior thorax-chest  . INGUINAL HERNIA REPAIR      x 2  . 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  SHOULDER OPEN ROTATOR CUFF REPAIR Right 04/23/2014   Procedure: RIGHT SHOULDER ROTATOR CUFF REPAIR WITH GRAFT AND ANCHORS . OPEN ACROMIONECTOMY;  Surgeon: Tobi Bastos, MD;  Location: [facility];  Service: Orthopedics;  Laterality: Right;  . SPINE SURGERY  2012   Dr Gladstone Lighter  . TONSILLECTOMY AND ADENOIDECTOMY      Current Outpatient Prescriptions  Medication Sig Dispense Refill  . apixaban (ELIQUIS) 5 MG TABS tablet Take 1 tablet (5 mg total) by mouth 2 (two) times daily. 180 tablet 3  . cholecalciferol (VITAMIN D) 1000 units tablet Take 1,000 Units by mouth daily.    . Cyanocobalamin (VITAMIN B-12) 5000 MCG SUBL Place 5,000 mcg under the tongue daily.    . metoprolol tartrate (LOPRESSOR) 25 MG tablet Take 1 tablet (25 mg total) by mouth 2 (two) times daily. 180 tablet 3  . Multiple Vitamin (MULTIVITAMIN WITH MINERALS) TABS tablet Take 1 tablet by mouth daily.    . Multiple Vitamins-Minerals (ICAPS AREDS 2 PO) Take 1 capsule by mouth 2 (two) times daily.    . quinapril (ACCUPRIL) 40 MG tablet Take 1 tablet (40 mg total) by mouth every morning. 90 tablet 3   No current facility-administered medications for this encounter.     Allergies  Allergen Reactions  . Glucosamine Itching    Itching feet and palms, tightness in chest  . Shellfish-Derived Products  Itching    Itching feet and palms, tightness in chest    Social History   Social History  . Marital status: Married    Spouse name: N/A  . Number of children: N/A  . Years of education: N/A   Occupational History  . Not on file.   Social History Main Topics  . Smoking status: Former Smoker    Quit date: 04/26/1990  . Smokeless tobacco: Former Systems developer     Comment: smoked [place] with 12 year abstinence ( 21 total years of smoking), up to 1 ppd  . Alcohol use No  . Drug use: No  . Sexual activity: Not on file   Other Topics Concern  . Not on file   Social History Narrative   No regular exercise    Family History  Problem  Relation Age of Onset  . Heart attack Father 43  . Diabetes Mother   . Transient ischemic attack Mother 66  . Diabetes Sister   . Heart attack Sister 51  . Stomach cancer Maternal Grandfather   . Cirrhosis Paternal Uncle        alcoholic  . Colon cancer Neg Hx   . Colon polyps Neg Hx     ROS-  All systems are reviewed and are negative except as outlined in the HPI above  Physical Exam: Vitals:   06/01/17 0959  BP: (!) 142/80  Pulse: 66  Weight: 198 lb (89.8 kg)  Height: 6' (1.829 m)    GEN- The patient is well appearing, alert and oriented x 3 today.   Head- normocephalic, atraumatic Eyes-  Sclera clear, conjunctiva pink Ears- hearing intact Oropharynx- clear Neck- supple, no JVP Lymph- no cervical lymphadenopathy Lungs- Clear to ausculation bilaterally, normal work of breathing Heart- Slightly irregular(PAC's) rate and rhythm, no murmurs, rubs or gallops, PMI not laterally displaced GI- soft, NT, ND, + BS Extremities- no clubbing, cyanosis, or edema MS- no significant deformity or atrophy Skin- no rash or lesion Psych- euthymic mood, full affect Neuro- strength and sensation are intact  Ekg-  SR with PAC's, RBBB, at 56 bpm, pr int 182 ms, QRS int 122 ms, Qtc 408 ms Epic records reviewed   Assessment and Plan:  1. Paroxysmal atrial fibrillation Quiet  Maintaining NSR Continue metoprolol.  2. Chadsvasc score of at least 2. Continue eliquis, no bleeding issues  3. HTN Stable   Return 1 year  Geroge Baseman. Erminio Nygard, [facility] [address] [place], [place] [address] [phone]

## 2017-07-13 NOTE — Addendum Note (Signed)
Addendum  created 07/13/17 1005 by Wandersee Gaudy, MD   Sign clinical note

## 2017-07-26 DIAGNOSIS — H43811 Vitreous degeneration, right eye: Secondary | ICD-10-CM | POA: Diagnosis not present

## 2017-08-10 DIAGNOSIS — Z23 Encounter for immunization: Secondary | ICD-10-CM | POA: Diagnosis not present

## 2017-08-23 DIAGNOSIS — H903 Sensorineural hearing loss, bilateral: Secondary | ICD-10-CM | POA: Diagnosis not present

## 2017-10-25 NOTE — Patient Instructions (Addendum)
Start using a saline nasal spray and flonase.  Test(s) ordered today. Your results will be released to [place] (or called to you) after review, usually within 72hours after test completion. If any changes need to be made, you will be notified at that same time.   No immunizations administered today.   Medications reviewed and updated.  No changes recommended at this time.    Please followup in 6 months

## 2017-10-25 NOTE — Progress Notes (Signed)
Subjective:    Patient ID: Sergio Mack, male    DOB: 12/14/1945, 71 y.o.   MRN: [id number]  HPI The patient is here for follow up.   Ask re dep  Hypertension: He is taking his medication daily. He is compliant with a low sodium diet.  He denies chest pain, palpitations, edema, shortness of breath and regular headaches. He is exercising regularly - walks.  He does not monitor his blood pressure at home.    Paroxysmal Afib:  He is taking eliquis and metoprolol.  He denies palpitations, chest pain and shortness of breath.  He is following with cardiology.   He does feel fatigued all the time and is unsure why.  Prediabetes:  He is not compliant with a low sugar/carbohydrate diet.  He is exercising regularly - walks 2-3 miles a day at least 4 days/week.  Sinus issues:  He has been having nasal congestion and is blowing his nose and had some greenish mucus and some specs of blood.  He has sinus pressure at night.  His symptoms are better during the day.    He has some decreased memory - he is unsure if it is within normal limits for age or more than that.   Medications and allergies reviewed with patient and updated if appropriate.  Patient Active Problem List   Diagnosis Date Noted  . Cramping of feet 04/24/2017  . Headache 04/24/2017  . 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 Prediabetes 04/07/2016  . Full thickness rotator cuff tear 04/23/2014  . Anticoagulation adequate, new on eliquis 03/07/2014  . Paroxysmal atrial fibrillation ([place]) 03/06/2014  . PREMATURE VENTRICULAR CONTRACTIONS, FREQUENT 05/13/2010  . SKIN CANCER, HX OF 03/24/2010  . DIVERTICULOSIS, COLON 12/11/2008  . SPINAL STENOSIS 01/14/2008  . BACK PAIN, LUMBAR, WITH RADICULOPATHY 01/02/2008  . HYPERLIPIDEMIA 10/31/2007  . 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 Essential hypertension 10/31/2007  . 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 GERD 09/27/2007    Current Outpatient  Medications on File Prior to Visit  Medication Sig Dispense Refill  . apixaban (ELIQUIS) 5 MG TABS tablet Take 1 tablet (5 mg total) by mouth 2 (two) times daily. 180 tablet 3  . cholecalciferol (VITAMIN D) 1000 units tablet Take 1,000 Units by mouth daily.    . Cyanocobalamin (VITAMIN B-12) 5000 MCG SUBL Place 5,000 mcg under the tongue daily.    . metoprolol tartrate (LOPRESSOR) 25 MG tablet Take 1 tablet (25 mg total) by mouth 2 (two) times daily. 180 tablet 3  . Multiple Vitamin (MULTIVITAMIN WITH MINERALS) TABS tablet Take 1 tablet by mouth daily.    . Multiple Vitamins-Minerals (ICAPS AREDS 2 PO) Take 1 capsule by mouth 2 (two) times daily.    . quinapril (ACCUPRIL) 40 MG tablet Take 1 tablet (40 mg total) by mouth every morning. 90 tablet 3   No current facility-administered medications on file prior to visit.     Past Medical History:  Diagnosis Date  . Anticoagulation adequate, new on eliquis 03/07/2014  . Atrial fibrillation with RVR, new onset, CHA2DS2-VASc score 2- back to SR at discharge 03/06/2014  . Diverticulosis 2006  . Dysrhythmia    Hx A-Fib from 12/2013-new onset- on meds  . Erectile dysfunction   . GERD (gastroesophageal reflux disease)   . History of skin cancer   . History of syncope 1994   no etiology; no recurrence  . Hyperlipidemia    LDL goal = < 100, ideally <  33  . Hypertension   . PVC's (premature ventricular contractions)   . Rotator cuff tear     Past Surgical History:  Procedure Laterality Date  . COLONOSCOPY  2006   Diverticulosis  . CYSTECTOMY     lip  . CYSTECTOMY     anterior thorax-chest  . INGUINAL HERNIA REPAIR      x 2  . 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 SHOULDER OPEN ROTATOR CUFF REPAIR Right 04/23/2014   Procedure: RIGHT SHOULDER ROTATOR CUFF REPAIR WITH GRAFT AND ANCHORS . OPEN  ACROMIONECTOMY;  Surgeon: Tobi Bastos, MD;  Location: [facility];  Service: Orthopedics;  Laterality: Right;  . SPINE SURGERY  2012   Dr Gladstone Lighter  . TONSILLECTOMY AND ADENOIDECTOMY      Social History   Socioeconomic History  . Marital status: Married    Spouse name: None  . Number of children: None  . Years of education: None  . Highest education level: None  Social Needs  . Financial resource strain: None  . Food insecurity - worry: None  . Food insecurity - inability: None  . Transportation needs - medical: None  . Transportation needs - non-medical: None  Occupational History  . None  Tobacco Use  . Smoking status: Former Smoker    Last attempt to quit: 04/26/1990    Years since quitting: 27.5  . Smokeless tobacco: Former Systems developer  . Tobacco comment: smoked [place] with 12 year abstinence ( 21 total years of smoking), up to 1 ppd  Substance and Sexual Activity  . Alcohol use: No    Alcohol/week: 0.0 oz  . Drug use: No  . Sexual activity: None  Other Topics Concern  . None  Social History Narrative   No regular exercise    Family History  Problem Relation Age of Onset  . Heart attack Father 9  . Diabetes Mother   . Transient ischemic attack Mother 36  . Diabetes Sister   . Heart attack Sister 36  . Stomach cancer Maternal Grandfather   . Cirrhosis Paternal Uncle        alcoholic  . Colon cancer Neg Hx   . Colon polyps Neg Hx     Review of Systems  Constitutional: Positive for fatigue. Negative for chills and fever.  HENT: Positive for congestion, postnasal drip and sinus pressure. Negative for ear pain and sore throat.   Respiratory: Positive for cough (pnd). Negative for shortness of breath and wheezing.   Cardiovascular: Negative for chest pain, palpitations and leg swelling.  Neurological: Negative for light-headedness and headaches.  Psychiatric/Behavioral: Negative for dysphoric mood. The patient is not nervous/anxious.        Objective:    Vitals:   10/26/17 0847  BP: (!) 154/96  Pulse: 86  Resp: 16  Temp: 98.2 F (36.8 C)  SpO2: 98%   Wt Readings from Last 3 Encounters:  10/26/17 198 lb (89.8 kg)  06/01/17 198 lb (89.8 kg)  04/24/17 196 lb (88.9 kg)   Body mass index is 26.85 kg/m.   Physical Exam    Constitutional: Appears well-developed and well-nourished. No distress.  HENT:  Head: Normocephalic and atraumatic.  Neck: Neck supple. No tracheal deviation present. No thyromegaly present.  No cervical lymphadenopathy, ears canals and TM b/l normal, oropharynx normal Cardiovascular: Normal rate, irregular rhythm.  2/6 systolic murmur  heard. No carotid bruit .  No edema Pulmonary/Chest: Effort normal and breath sounds normal. No respiratory distress. No has no wheezes. No rales.  Skin: Skin is warm and dry. Not diaphoretic.  Psychiatric: Normal mood and affect. Behavior is normal.      Assessment & Plan:    See Problem List for Assessment and Plan of chronic medical problems.

## 2017-10-26 ENCOUNTER — Encounter: Payer: Self-pay | Admitting: Internal Medicine

## 2017-10-26 ENCOUNTER — Ambulatory Visit (INDEPENDENT_AMBULATORY_CARE_PROVIDER_SITE_OTHER): Payer: Medicare Other | Admitting: Internal Medicine

## 2017-10-26 ENCOUNTER — Other Ambulatory Visit (INDEPENDENT_AMBULATORY_CARE_PROVIDER_SITE_OTHER): Payer: Medicare Other

## 2017-10-26 VITALS — BP 154/96 | HR 86 | Temp 98.2°F | Resp 16 | Wt 198.0 lb

## 2017-10-26 DIAGNOSIS — I480 Paroxysmal atrial fibrillation: Secondary | ICD-10-CM | POA: Diagnosis not present

## 2017-10-26 DIAGNOSIS — J329 Chronic sinusitis, unspecified: Secondary | ICD-10-CM | POA: Insufficient documentation

## 2017-10-26 DIAGNOSIS — J0100 Acute maxillary sinusitis, unspecified: Secondary | ICD-10-CM

## 2017-10-26 DIAGNOSIS — I10 Essential (primary) hypertension: Secondary | ICD-10-CM

## 2017-10-26 DIAGNOSIS — R413 Other amnesia: Secondary | ICD-10-CM | POA: Diagnosis not present

## 2017-10-26 DIAGNOSIS — R7303 Prediabetes: Secondary | ICD-10-CM

## 2017-10-26 LAB — LIPID PANEL
CHOL/HDL RATIO: 2
Cholesterol: 96 mg/dL (ref 0–200)
HDL: 42.3 mg/dL (ref >39.00)
LDL Cholesterol: 42 mg/dL (ref 0–99)
NONHDL: 54.09
Triglycerides: 60 mg/dL (ref 0.0–149.0)
VLDL: 12 mg/dL (ref 0.0–40.0)

## 2017-10-26 LAB — COMPREHENSIVE METABOLIC PANEL
ALT: 43 U/L (ref 0–53)
AST: 50 U/L — AB (ref 0–37)
Albumin: 4.1 g/dL (ref 3.5–5.2)
Alkaline Phosphatase: 84 U/L (ref 39–117)
BILIRUBIN TOTAL: 0.7 mg/dL (ref 0.2–1.2)
BUN: 8 mg/dL (ref 6–23)
CALCIUM: 8.7 mg/dL (ref 8.4–10.5)
CHLORIDE: 100 meq/L (ref 96–112)
CO2: 27 meq/L (ref 19–32)
Creatinine, Ser: 0.98 mg/dL (ref 0.40–1.50)
GFR: 79.87 mL/min (ref >60.00)
GLUCOSE: 99 mg/dL (ref 70–99)
Potassium: 4.4 mEq/L (ref 3.5–5.1)
Sodium: 135 mEq/L (ref 135–145)
Total Protein: 6.6 g/dL (ref 6.0–8.3)

## 2017-10-26 LAB — CBC WITH DIFFERENTIAL/PLATELET
BASOS ABS: 0.1 10*3/uL (ref 0.0–0.1)
Basophils Relative: 0.6 % (ref 0.0–3.0)
Eosinophils Absolute: 0.1 10*3/uL (ref 0.0–0.7)
Eosinophils Relative: 1.2 % (ref 0.0–5.0)
HEMATOCRIT: 48.5 % (ref 39.0–52.0)
Hemoglobin: 15.9 g/dL (ref 13.0–17.0)
LYMPHS ABS: 1.8 10*3/uL (ref 0.7–4.0)
LYMPHS PCT: 17.6 % (ref 12.0–46.0)
MCHC: 32.8 g/dL (ref 30.0–36.0)
MCV: 88.2 fl (ref 78.0–100.0)
MONOS PCT: 9.5 % (ref 3.0–12.0)
Monocytes Absolute: 1 10*3/uL (ref 0.1–1.0)
NEUTROS ABS: 7.2 10*3/uL (ref 1.4–7.7)
NEUTROS PCT: 71.1 % (ref 43.0–77.0)
PLATELETS: 244 10*3/uL (ref 150.0–400.0)
RBC: 5.5 Mil/uL (ref 4.22–5.81)
RDW: 17.2 % — ABNORMAL HIGH (ref 11.5–15.5)
WBC: 10.2 10*3/uL (ref 4.0–10.5)

## 2017-10-26 LAB — HEMOGLOBIN A1C: HEMOGLOBIN A1C: 6 % (ref 4.6–6.5)

## 2017-10-26 LAB — TSH: TSH: 1.01 u[IU]/mL (ref 0.35–4.50)

## 2017-10-26 NOTE — Assessment & Plan Note (Signed)
?   Normal for age or more than normal Discussed things he can do to prevent worsening of memory Consider neuropsychology eval -deferred for now

## 2017-10-26 NOTE — Assessment & Plan Note (Signed)
Elevated here today Start monitoring it at home ? Controlled Continue current medication cmp

## 2017-10-26 NOTE — Assessment & Plan Note (Signed)
Try saline nasal spray, flonase and nasal strips If symptoms persist he will let me know and we can consider an antibiotic

## 2017-10-26 NOTE — Assessment & Plan Note (Signed)
Paroxysmal Asymptomatic unless it is causing some fatigue Rate controlled on metoprolol On eliquis Cbc, cmp Follow up with cardiology

## 2017-10-26 NOTE — Assessment & Plan Note (Signed)
Check a1c Low sugar / carb diet Stressed regular exercise, weight loss  

## 2017-10-29 ENCOUNTER — Encounter: Payer: Self-pay | Admitting: Internal Medicine

## 2017-11-02 DIAGNOSIS — R972 Elevated prostate specific antigen [PSA]: Secondary | ICD-10-CM | POA: Diagnosis not present

## 2017-11-02 DIAGNOSIS — N5201 Erectile dysfunction due to arterial insufficiency: Secondary | ICD-10-CM | POA: Diagnosis not present

## 2017-11-02 DIAGNOSIS — N400 Enlarged prostate without lower urinary tract symptoms: Secondary | ICD-10-CM | POA: Diagnosis not present

## 2017-11-10 ENCOUNTER — Encounter: Payer: Self-pay | Admitting: Internal Medicine

## 2017-11-15 ENCOUNTER — Encounter: Payer: Self-pay | Admitting: Internal Medicine

## 2017-11-15 ENCOUNTER — Ambulatory Visit (INDEPENDENT_AMBULATORY_CARE_PROVIDER_SITE_OTHER): Payer: Medicare Other | Admitting: Internal Medicine

## 2017-11-15 VITALS — BP 144/90 | HR 84 | Temp 97.9°F | Ht 72.0 in | Wt 204.0 lb

## 2017-11-15 DIAGNOSIS — I10 Essential (primary) hypertension: Secondary | ICD-10-CM | POA: Diagnosis not present

## 2017-11-15 DIAGNOSIS — J019 Acute sinusitis, unspecified: Secondary | ICD-10-CM

## 2017-11-15 DIAGNOSIS — I480 Paroxysmal atrial fibrillation: Secondary | ICD-10-CM | POA: Diagnosis not present

## 2017-11-15 MED ORDER — HYDROCODONE-HOMATROPINE 5-1.5 MG/5ML PO SYRP: 5.0000 mL | ORAL_SOLUTION | Freq: Four times a day (QID) | ORAL | 0 refills | Status: DC | PRN

## 2017-11-15 MED ORDER — METOPROLOL TARTRATE 50 MG PO TABS: 50.0000 mg | ORAL_TABLET | Freq: Two times a day (BID) | ORAL | 3 refills | Status: DC

## 2017-11-15 MED ORDER — AZITHROMYCIN 250 MG PO TABS: ORAL_TABLET | ORAL | 1 refills | Status: DC

## 2017-11-15 NOTE — Patient Instructions (Addendum)
Please take all new medication as prescribed - the antibiotic, and the cough medicine as needed  OK to increase the metoprolol to 50 mg twice per day  Please continue all other medications as before, and refills have been done if requested.  Please have the pharmacy call with any other refills you may need.  Please keep your appointments with your specialists as you may have planned  Please see Dr Quay Burow in 2-3 wks

## 2017-11-15 NOTE — Progress Notes (Signed)
Subjective:    Patient ID: Sergio Mack, male    DOB: July 24, 1946, 71 y.o.   MRN: [id number]  HPI   Here with 2-3 days acute onset fever, facial pain, pressure, headache, general weakness and malaise, and greenish d/c, with mild ST and cough, but pt denies chest pain, wheezing, increased sob or doe, orthopnea, PND, increased LE swelling, palpitations, dizziness or syncope. BP elevated at home with DBP 97-100 at least 2 wks.  Pt denies new neurological symptoms such as new headache, or facial or extremity weakness or numbness   Pt denies polydipsia, polyuria Past Medical History:  Diagnosis Date  . Anticoagulation adequate, new on eliquis 03/07/2014  . Atrial fibrillation with RVR, new onset, CHA2DS2-VASc score 2- back to SR at discharge 03/06/2014  . Diverticulosis 2006  . Dysrhythmia    Hx A-Fib from 12/2013-new onset- on meds  . Erectile dysfunction   . GERD (gastroesophageal reflux disease)   . History of skin cancer   . History of syncope 1994   no etiology; no recurrence  . Hyperlipidemia    LDL goal = < 100, ideally < 70  . Hypertension   . PVC's (premature ventricular contractions)   . Rotator cuff tear    Past Surgical History:  Procedure Laterality Date  . COLONOSCOPY  2006   Diverticulosis  . CYSTECTOMY     lip  . CYSTECTOMY     anterior thorax-chest  . INGUINAL HERNIA REPAIR      x 2  . 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 SHOULDER OPEN ROTATOR CUFF REPAIR Right 04/23/2014   Procedure: RIGHT SHOULDER ROTATOR CUFF REPAIR WITH GRAFT AND ANCHORS . OPEN ACROMIONECTOMY;  Surgeon: Tobi Bastos, MD;  Location: [facility];  Service: Orthopedics;  Laterality: Right;  . SPINE SURGERY  2012   Dr Gladstone Lighter  . TONSILLECTOMY AND ADENOIDECTOMY      reports that he quit smoking about 27 years ago. He has quit using smokeless  tobacco. He reports that he does not drink alcohol or use drugs. family history includes Cirrhosis in his paternal uncle; Diabetes in his mother and sister; Heart attack (age of onset: 63) in his sister; Heart attack (age of onset: 41) in his father; Stomach cancer in his maternal grandfather; Transient ischemic attack (age of onset: 54) in his mother. Allergies  Allergen Reactions  . Glucosamine Itching    Itching feet and palms, tightness in chest  . Shellfish-Derived Products Itching    Itching feet and palms, tightness in chest   Current Outpatient Medications on File Prior to Visit  Medication Sig Dispense Refill  . apixaban (ELIQUIS) 5 MG TABS tablet Take 1 tablet (5 mg total) by mouth 2 (two) times daily. 180 tablet 3  . cholecalciferol (VITAMIN D) 1000 units tablet Take 1,000 Units by mouth daily.    . Cyanocobalamin (VITAMIN B-12) 5000 MCG SUBL Place 5,000 mcg under the tongue daily.    . Multiple Vitamin (MULTIVITAMIN WITH MINERALS) TABS tablet Take 1 tablet by mouth daily.    . Multiple Vitamins-Minerals (ICAPS AREDS 2 PO) Take 1 capsule by mouth 2 (two) times daily.    . quinapril (ACCUPRIL) 40 MG tablet Take 1 tablet (40 mg total) by mouth every morning. 90 tablet 3   No current facility-administered medications on file prior to visit.    Review of  Systems  Constitutional: Negative for other unusual diaphoresis or sweats HENT: Negative for ear discharge or swelling Eyes: Negative for other worsening visual disturbances Respiratory: Negative for stridor or other swelling  Gastrointestinal: Negative for worsening distension or other blood Genitourinary: Negative for retention or other urinary change Musculoskeletal: Negative for other MSK pain or swelling Skin: Negative for color change or other new lesions Neurological: Negative for worsening tremors and other numbness  Psychiatric/Behavioral: Negative for worsening agitation or other fatigue All other system neg per pt      Objective:   Physical Exam BP (!) 144/90   Pulse 84   Temp 97.9 F (36.6 C) (Oral)   Ht 6' (1.829 m)   Wt 204 lb (92.5 kg)   SpO2 98%   BMI 27.67 kg/m  VS noted, mild ill Constitutional: Pt appears in NAD HENT: Head: NCAT.  Right Ear: External ear normal.  Left Ear: External ear normal.  Eyes: . Pupils are equal, round, and reactive to light. Conjunctivae and EOM are normal Nose: without d/c or deformity Bilat tm's with mild erythema.  Max sinus areas mild tender.  Pharynx with mild erythema, no exudate Neck: Neck supple. Gross normal ROM Cardiovascular: Normal rate and irregular rhythm.   Pulmonary/Chest: Effort normal and breath sounds without rales or wheezing.  Neurological: Pt is alert. At baseline orientation, motor grossly intact Skin: Skin is warm. No rashes, other new lesions, no LE edema Psychiatric: Pt behavior is normal without agitation  No other exam findings    Assessment & Plan:

## 2017-11-16 ENCOUNTER — Encounter: Payer: Self-pay | Admitting: Internal Medicine

## 2017-11-16 DIAGNOSIS — J019 Acute sinusitis, unspecified: Secondary | ICD-10-CM | POA: Insufficient documentation

## 2017-11-16 NOTE — Assessment & Plan Note (Signed)
Mild to mod, for antibx course,  to f/u any worsening symptoms or concerns 

## 2017-11-16 NOTE — Assessment & Plan Note (Signed)
Mild uncontrolled, for incresaed BB to 50 bid

## 2017-11-16 NOTE — Assessment & Plan Note (Signed)
With goal HR 55 - also to likely improve with increased BB

## 2017-11-24 ENCOUNTER — Other Ambulatory Visit (INDEPENDENT_AMBULATORY_CARE_PROVIDER_SITE_OTHER): Payer: Medicare Other

## 2017-11-24 ENCOUNTER — Encounter: Payer: Self-pay | Admitting: Internal Medicine

## 2017-11-24 ENCOUNTER — Other Ambulatory Visit: Payer: Self-pay | Admitting: Internal Medicine

## 2017-11-24 ENCOUNTER — Ambulatory Visit (INDEPENDENT_AMBULATORY_CARE_PROVIDER_SITE_OTHER): Payer: Medicare Other | Admitting: Internal Medicine

## 2017-11-24 ENCOUNTER — Ambulatory Visit (INDEPENDENT_AMBULATORY_CARE_PROVIDER_SITE_OTHER)
Admission: RE | Admit: 2017-11-24 | Discharge: 2017-11-24 | Disposition: A | Discharge status: Home / Self Care | Payer: Medicare Other | Source: Ambulatory Visit | Attending: Internal Medicine | Admitting: Internal Medicine

## 2017-11-24 VITALS — BP 160/96 | HR 98 | Temp 98.0°F | Resp 18 | Wt 209.0 lb

## 2017-11-24 DIAGNOSIS — I480 Paroxysmal atrial fibrillation: Secondary | ICD-10-CM

## 2017-11-24 DIAGNOSIS — R0602 Shortness of breath: Secondary | ICD-10-CM

## 2017-11-24 DIAGNOSIS — R600 Localized edema: Secondary | ICD-10-CM

## 2017-11-24 DIAGNOSIS — I10 Essential (primary) hypertension: Secondary | ICD-10-CM | POA: Diagnosis not present

## 2017-11-24 LAB — CBC WITH DIFFERENTIAL/PLATELET
BASOS ABS: 0.1 10*3/uL (ref 0.0–0.1)
Basophils Relative: 0.6 % (ref 0.0–3.0)
EOS ABS: 0.1 10*3/uL (ref 0.0–0.7)
EOS PCT: 0.6 % (ref 0.0–5.0)
HCT: 46.9 % (ref 39.0–52.0)
HEMOGLOBIN: 15.2 g/dL (ref 13.0–17.0)
Lymphocytes Relative: 17.2 % (ref 12.0–46.0)
Lymphs Abs: 1.7 10*3/uL (ref 0.7–4.0)
MCHC: 32.5 g/dL (ref 30.0–36.0)
MCV: 88.5 fl (ref 78.0–100.0)
Monocytes Absolute: 1 10*3/uL (ref 0.1–1.0)
Monocytes Relative: 9.8 % (ref 3.0–12.0)
NEUTROS PCT: 71.8 % (ref 43.0–77.0)
Neutro Abs: 7.1 10*3/uL (ref 1.4–7.7)
Platelets: 213 10*3/uL (ref 150.0–400.0)
RBC: 5.3 Mil/uL (ref 4.22–5.81)
RDW: 15.3 % (ref 11.5–15.5)
WBC: 9.9 10*3/uL (ref 4.0–10.5)

## 2017-11-24 LAB — COMPREHENSIVE METABOLIC PANEL
ALBUMIN: 3.8 g/dL (ref 3.5–5.2)
ALK PHOS: 87 U/L (ref 39–117)
ALT: 30 U/L (ref 0–53)
AST: 28 U/L (ref 0–37)
BILIRUBIN TOTAL: 1 mg/dL (ref 0.2–1.2)
BUN: 8 mg/dL (ref 6–23)
CO2: 28 mEq/L (ref 19–32)
Calcium: 8.8 mg/dL (ref 8.4–10.5)
Chloride: 98 mEq/L (ref 96–112)
Creatinine, Ser: 0.98 mg/dL (ref 0.40–1.50)
GFR: 79.86 mL/min (ref >60.00)
Glucose, Bld: 110 mg/dL — ABNORMAL HIGH (ref 70–99)
POTASSIUM: 4.2 meq/L (ref 3.5–5.1)
Sodium: 134 mEq/L — ABNORMAL LOW (ref 135–145)
TOTAL PROTEIN: 6.3 g/dL (ref 6.0–8.3)

## 2017-11-24 LAB — BRAIN NATRIURETIC PEPTIDE: PRO B NATRI PEPTIDE: 532 pg/mL — AB (ref 0.0–100.0)

## 2017-11-24 MED ORDER — FUROSEMIDE 20 MG PO TABS: 20.0000 mg | ORAL_TABLET | Freq: Every day | ORAL | 0 refills | Status: DC

## 2017-11-24 MED ORDER — DOXYCYCLINE HYCLATE 100 MG PO TABS: 100.0000 mg | ORAL_TABLET | Freq: Two times a day (BID) | ORAL | 0 refills | Status: DC

## 2017-11-24 MED ORDER — METOPROLOL TARTRATE 50 MG PO TABS: 100.0000 mg | ORAL_TABLET | Freq: Two times a day (BID) | ORAL | 3 refills | Status: DC

## 2017-11-24 NOTE — Assessment & Plan Note (Signed)
New, likely related to mild CHF from Afib Cbc, cmp, bnp ordered Start lasix 20 mg daily x 5 days Metoprolol increased to 100 mg BID Sees cardio next week

## 2017-11-24 NOTE — Assessment & Plan Note (Signed)
In Afib now- experiencing DOE and orthopnea as well as LE edema Sees cardiology for follow up next week Cbc, cmp, bnp ordered CXR On eliquis Start lasix 20 mg daily x 5 days Metoprolol increased to 100 mg BID

## 2017-11-24 NOTE — Assessment & Plan Note (Addendum)
DOE and orthopnea - likely related to mild CHF from AFib Sees cardiology for follow up next week Cbc, cmp, bnp ordered CXR to evaluate for fluid overload and PNA On eliquis Start lasix 20 mg daily x 5 days Metoprolol increased to 100 mg BID

## 2017-11-24 NOTE — Patient Instructions (Addendum)
You had an EKG today that showed Afib.  Have a chest x-ray and blood work done today.  Your results will be released to [place] (or called to you) after review, usually within 72hours after test completion. If any changes need to be made, you will be notified at that same time.  Medications reviewed and updated.  Changes include taking lasix 20 mg daily.  Increase the metoprolol to 100 mg twice daily.   Your prescription(s) have been submitted to your pharmacy. Please take as directed and contact our office if you believe you are having problem(s) with the medication(s).   Call with any questions.

## 2017-11-24 NOTE — Progress Notes (Signed)
Subjective:    Patient ID: Sergio Mack, male    DOB: 12-14-1945, 72 y.o.   MRN: [id number]  HPI The patient is here for follow up from his sinus infection.  He was seen 12/26 and was diagnosed with a sinus infection.  He was prescribed a zpak and hycodan cough syrup.  He was also found to have elevated BP and it was elevated at home as well.  His metoprolol was increased to 50 mg BID.  Sinus infection: he completed the antibiotic and used the cough syrup.  He denies sinus congestion and sinus pressure.  He denies ear pain and sore throat.  He has congestion in his chest.    SOB: this started about one month ago. When he lays down at night he is SOB - even in the recliner he feels SOB.  He also feels SOB with exertion.  As the day goes on he feels better.  He does have some mild cough and has heard wheezing.  The cough is not productive. He has also noted leg swelling and that is new - the swelling is getting worse.   Hypertension: He is taking his medication daily. He is compliant with a low sodium diet.  He denies chest pain, palpitations, and regular headaches. He is exercising regularly.  He does monitor his blood pressure at home - it has been consistently elevated over the past month.  There has been no improvement since his metoprolol was increased the end of last week.       Medications and allergies reviewed with patient and updated if appropriate.  Patient Active Problem List   Diagnosis Date Noted  . Acute sinus infection 11/16/2017  . Sinus infection 10/26/2017  . Memory difficulties 10/26/2017  . Cramping of feet 04/24/2017  . Headache 04/24/2017  . 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 Sensorineural hearing loss (SNHL), 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  . Prediabetes 04/07/2016  . Full thickness rotator cuff tear 04/23/2014  . Anticoagulation adequate, new on eliquis 03/07/2014  . Paroxysmal atrial fibrillation ([place]) 03/06/2014  . PREMATURE VENTRICULAR CONTRACTIONS,  FREQUENT 05/13/2010  . SKIN CANCER, HX OF 03/24/2010  . DIVERTICULOSIS, COLON 12/11/2008  . SPINAL STENOSIS 01/14/2008  . BACK PAIN, LUMBAR, WITH RADICULOPATHY 01/02/2008  . HYPERLIPIDEMIA 10/31/2007  . 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 Essential hypertension 10/31/2007  . 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 GERD 09/27/2007    Current Outpatient Medications on File Prior to Visit  Medication Sig Dispense Refill  . apixaban (ELIQUIS) 5 MG TABS tablet Take 1 tablet (5 mg total) by mouth 2 (two) times daily. 180 tablet 3  . cholecalciferol (VITAMIN D) 1000 units tablet Take 1,000 Units by mouth daily.    . Cyanocobalamin (VITAMIN B-12) 5000 MCG SUBL Place 5,000 mcg under the tongue daily.    Marland Kitchen HYDROcodone-homatropine (HYCODAN) 5-1.5 MG/5ML syrup Take 5 mLs by mouth every 6 (six) hours as needed for up to 10 days for cough. 180 mL 0  . metoprolol tartrate (LOPRESSOR) 50 MG tablet Take 1 tablet (50 mg total) by mouth 2 (two) times daily. 180 tablet 3  . Multiple Vitamin (MULTIVITAMIN WITH MINERALS) TABS tablet Take 1 tablet by mouth daily.    . Multiple Vitamins-Minerals (ICAPS AREDS 2 PO) Take 1 capsule by mouth 2 (two) times daily.    . quinapril (ACCUPRIL) 40 MG tablet Take 1 tablet (40 mg total) by mouth every morning. 90 tablet 3   No current  facility-administered medications on file prior to visit.     Past Medical History:  Diagnosis Date  . Anticoagulation adequate, new on eliquis 03/07/2014  . Atrial fibrillation with RVR, new onset, CHA2DS2-VASc score 2- back to SR at discharge 03/06/2014  . Diverticulosis 2006  . Dysrhythmia    Hx A-Fib from 12/2013-new onset- on meds  . Erectile dysfunction   . GERD (gastroesophageal reflux disease)   . History of skin cancer   . History of syncope 1994   no etiology; no recurrence  . Hyperlipidemia    LDL goal = < 100, ideally < 70  . Hypertension   . PVC's (premature ventricular contractions)     . Rotator cuff tear     Past Surgical History:  Procedure Laterality Date  . COLONOSCOPY  2006   Diverticulosis  . CYSTECTOMY     lip  . CYSTECTOMY     anterior thorax-chest  . INGUINAL HERNIA REPAIR      x 2  . 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 SHOULDER OPEN ROTATOR CUFF REPAIR Right 04/23/2014   Procedure: RIGHT SHOULDER ROTATOR CUFF REPAIR WITH GRAFT AND ANCHORS . OPEN ACROMIONECTOMY;  Surgeon: Tobi Bastos, MD;  Location: [facility];  Service: Orthopedics;  Laterality: Right;  . SPINE SURGERY  2012   Dr Gladstone Lighter  . TONSILLECTOMY AND ADENOIDECTOMY      Social History   Socioeconomic History  . Marital status: Married    Spouse name: None  . Number of children: None  . Years of education: None  . Highest education level: None  Social Needs  . Financial resource strain: None  . Food insecurity - worry: None  . Food insecurity - inability: None  . Transportation needs - medical: None  . Transportation needs - non-medical: None  Occupational History  . None  Tobacco Use  . Smoking status: Former Smoker    Last attempt to quit: 04/26/1990    Years since quitting: 27.6  . Smokeless tobacco: Former Systems developer  . Tobacco comment: smoked [place] with 12 year abstinence ( 21 total years of smoking), up to 1 ppd  Substance and Sexual Activity  . Alcohol use: No    Alcohol/week: 0.0 oz  . Drug use: No  . Sexual activity: None  Other Topics Concern  . None  Social History Narrative   No regular exercise    Family History  Problem Relation Age of Onset  . Heart attack Father 17  . Diabetes Mother   . Transient ischemic attack Mother 5  . Diabetes Sister   . Heart attack Sister 79  . Stomach cancer Maternal Grandfather   . Cirrhosis Paternal Uncle        alcoholic  . Colon cancer Neg Hx   . Colon polyps Neg Hx      Review of Systems  Constitutional: Positive for fatigue. Negative for appetite change, chills and fever.  HENT: Negative for congestion (controlled with saline spray), ear pain, sinus pressure, sinus pain and sore throat.   Respiratory: Positive for cough, shortness of breath (with laying down, reclining and exertion x 1 month) and wheezing.   Cardiovascular: Positive for leg swelling. Negative for chest pain and palpitations.  Neurological: Positive for light-headedness (from coughing ). Negative for headaches.       Objective:   Vitals:   11/24/17  0831  BP: (!) 160/96  Pulse: 98  Resp: 18  Temp: 98 F (36.7 C)  SpO2: 98%   Wt Readings from Last 3 Encounters:  11/24/17 209 lb (94.8 kg)  11/15/17 204 lb (92.5 kg)  10/26/17 198 lb (89.8 kg)   Body mass index is 28.35 kg/m.   Physical Exam    GENERAL APPEARANCE: Appears stated age, well appearing, NAD EYES: conjunctiva clear, no icterus HEENT: bilateral tympanic membranes and ear canals normal, oropharynx with mild erythema, no thyromegaly, trachea midline, no cervical or supraclavicular lymphadenopathy LUNGS: Clear to auscultation without wheeze or crackles, unlabored breathing, good air entry bilaterally CARDIOVASCULAR: irregular rhythm, without murmurs, 1 + b/l LE edema pitting in nature SKIN: Warm, dry   EKG: Afib - flutter at 104, incomplete RBBB ; he was in sinus rhythm on his EKG in July, RBBB not new; has paroxysmal Afib so this is not new   Assessment & Plan:    See Problem List for Assessment and Plan of chronic medical problems.

## 2017-11-24 NOTE — Assessment & Plan Note (Signed)
Not controlled In Afib Metoprolol increased to 100 mg BID Cmp

## 2017-11-29 ENCOUNTER — Ambulatory Visit (HOSPITAL_COMMUNITY)
Admission: RE | Admit: 2017-11-29 | Discharge: 2017-11-29 | Disposition: A | Discharge status: Home / Self Care | Payer: Medicare Other | Source: Ambulatory Visit | Attending: Nurse Practitioner | Admitting: Nurse Practitioner

## 2017-11-29 ENCOUNTER — Encounter (HOSPITAL_COMMUNITY): Payer: Self-pay | Admitting: Nurse Practitioner

## 2017-11-29 VITALS — BP 136/86 | HR 99 | Ht 72.0 in | Wt 204.0 lb

## 2017-11-29 DIAGNOSIS — K219 Gastro-esophageal reflux disease without esophagitis: Secondary | ICD-10-CM | POA: Insufficient documentation

## 2017-11-29 DIAGNOSIS — Z87891 Personal history of nicotine dependence: Secondary | ICD-10-CM | POA: Insufficient documentation

## 2017-11-29 DIAGNOSIS — E785 Hyperlipidemia, unspecified: Secondary | ICD-10-CM | POA: Insufficient documentation

## 2017-11-29 DIAGNOSIS — Z7901 Long term (current) use of anticoagulants: Secondary | ICD-10-CM | POA: Diagnosis not present

## 2017-11-29 DIAGNOSIS — I10 Essential (primary) hypertension: Secondary | ICD-10-CM | POA: Insufficient documentation

## 2017-11-29 DIAGNOSIS — Z85828 Personal history of other malignant neoplasm of skin: Secondary | ICD-10-CM | POA: Diagnosis not present

## 2017-11-29 DIAGNOSIS — I4819 Other persistent atrial fibrillation: Secondary | ICD-10-CM

## 2017-11-29 DIAGNOSIS — I481 Persistent atrial fibrillation: Secondary | ICD-10-CM | POA: Diagnosis not present

## 2017-11-29 DIAGNOSIS — Z79899 Other long term (current) drug therapy: Secondary | ICD-10-CM | POA: Insufficient documentation

## 2017-11-29 LAB — BASIC METABOLIC PANEL
Anion gap: 6 (ref 5–15)
BUN: 9 mg/dL (ref 6–20)
CO2: 27 mmol/L (ref 22–32)
CREATININE: 1.01 mg/dL (ref 0.61–1.24)
Calcium: 9 mg/dL (ref 8.9–10.3)
Chloride: 100 mmol/L — ABNORMAL LOW (ref 101–111)
GFR calc Af Amer: >60 mL/min (ref >60)
Glucose, Bld: 103 mg/dL — ABNORMAL HIGH (ref 65–99)
Potassium: 4.3 mmol/L (ref 3.5–5.1)
SODIUM: 133 mmol/L — AB (ref 135–145)

## 2017-11-29 LAB — MAGNESIUM: Magnesium: 1.9 mg/dL (ref 1.7–2.4)

## 2017-11-29 MED ORDER — FUROSEMIDE 20 MG PO TABS: 20.0000 mg | ORAL_TABLET | Freq: Every day | ORAL | 1 refills | Status: DC

## 2017-11-29 NOTE — Patient Instructions (Signed)
Continue lasix 20mg once a day 

## 2017-11-29 NOTE — Progress Notes (Signed)
Patient ID: Sergio Mack, male   DOB: 01/24/1946, 72 y.o.   MRN: [id number]      PCP:  Binnie Rail, MD  The patient presents today for electrophysiology followup.  He was seen in May 2015 after a brief episode  of afib around time of shoulder surgery, from which he spontaneously converted to NSR.  Was started on  eliquis and metoprolol without further problems. He had a recent echo and stress test at time of surgery with normal EF and low risk stress test.  Seen in afib clinic today for persistent afib that started around the time of a sinus infection a couple weeks ago, which has now progressed to symptoms in his chest. He was seen by PCP and started on   antibiotics for 10 more days and  has  also been started on lasix 20 mg daily for swelling of LE's during all of this. He has felt very fatigued and short of breath as well. He knows he has not missed any eliquis last week but is not sure before that. Metoprolol has also been increased to rate control pt. He is starting to feel  Better.CXR/labs were done at PCP office. Did also take some decongestants around the beginning of illness.  Today, he denies symptoms of palpitations, chest pain, , orthopnea, PND, lower extremity edema, dizziness, presyncope, syncope, or neurologic sequela.  Positive for shortness of breath and fatigue The patient feels that he is tolerating medications without difficulties and is otherwise without complaint today.     Past Medical History:  Diagnosis Date  . Anticoagulation adequate, new on eliquis 03/07/2014  . Atrial fibrillation with RVR, new onset, CHA2DS2-VASc score 2- back to SR at discharge 03/06/2014  . Diverticulosis 2006  . Dysrhythmia    Hx A-Fib from 12/2013-new onset- on meds  . Erectile dysfunction   . GERD (gastroesophageal reflux disease)   . History of skin cancer   . History of syncope 1994   no etiology; no recurrence  . Hyperlipidemia    LDL goal = < 100, ideally < 70  . Hypertension     . PVC's (premature ventricular contractions)   . Rotator cuff tear    Past Surgical History:  Procedure Laterality Date  . COLONOSCOPY  2006   Diverticulosis  . CYSTECTOMY     lip  . CYSTECTOMY     anterior thorax-chest  . INGUINAL HERNIA REPAIR      x 2  . 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 SHOULDER OPEN ROTATOR CUFF REPAIR Right 04/23/2014   Procedure: RIGHT SHOULDER ROTATOR CUFF REPAIR WITH GRAFT AND ANCHORS . OPEN ACROMIONECTOMY;  Surgeon: Tobi Bastos, MD;  Location: [facility];  Service: Orthopedics;  Laterality: Right;  . SPINE SURGERY  2012   Dr Gladstone Lighter  . TONSILLECTOMY AND ADENOIDECTOMY      Current Outpatient Medications  Medication Sig Dispense Refill  . apixaban (ELIQUIS) 5 MG TABS tablet Take 1 tablet (5 mg total) by mouth 2 (two) times daily. 180 tablet 3  . cholecalciferol (VITAMIN D) 1000 units tablet Take 1,000 Units by mouth daily.    . Cyanocobalamin (VITAMIN B-12) 5000 MCG SUBL Place 5,000 mcg under the tongue daily.    Marland Kitchen doxycycline (VIBRA-TABS) 100 MG tablet Take 1 tablet (100 mg total) by mouth 2 (two) times daily. 20 tablet 0  .  metoprolol tartrate (LOPRESSOR) 50 MG tablet Take 2 tablets (100 mg total) by mouth 2 (two) times daily. 180 tablet 3  . Multiple Vitamins-Minerals (ICAPS AREDS 2 PO) Take 1 capsule by mouth 2 (two) times daily.    . quinapril (ACCUPRIL) 40 MG tablet Take 1 tablet (40 mg total) by mouth every morning. 90 tablet 3  . Zinc 50 MG CAPS Take 1 capsule by mouth every 3 (three) days.    . furosemide (LASIX) 20 MG tablet Take 1 tablet (20 mg total) by mouth daily. 30 tablet 1   No current facility-administered medications for this encounter.     Allergies  Allergen Reactions  . Glucosamine Itching    Itching feet and palms, tightness in chest  . Shellfish-Derived Products Itching     Itching feet and palms, tightness in chest    Social History   Socioeconomic History  . Marital status: Married    Spouse name: Not on file  . Number of children: Not on file  . Years of education: Not on file  . Highest education level: Not on file  Social Needs  . Financial resource strain: Not on file  . Food insecurity - worry: Not on file  . Food insecurity - inability: Not on file  . Transportation needs - medical: Not on file  . Transportation needs - non-medical: Not on file  Occupational History  . Not on file  Tobacco Use  . Smoking status: Former Smoker    Last attempt to quit: 04/26/1990    Years since quitting: 27.6  . Smokeless tobacco: Former Systems developer  . Tobacco comment: smoked [place] with 12 year abstinence ( 21 total years of smoking), up to 1 ppd  Substance and Sexual Activity  . Alcohol use: No    Alcohol/week: 0.0 oz  . Drug use: No  . Sexual activity: Not on file  Other Topics Concern  . Not on file  Social History Narrative   No regular exercise    Family History  Problem Relation Age of Onset  . Heart attack Father 72  . Diabetes Mother   . Transient ischemic attack Mother 89  . Diabetes Sister   . Heart attack Sister 4  . Stomach cancer Maternal Grandfather   . Cirrhosis Paternal Uncle        alcoholic  . Colon cancer Neg Hx   . Colon polyps Neg Hx     ROS-  All systems are reviewed and are negative except as outlined in the HPI above  Physical Exam: Vitals:   11/29/17 1019  BP: 136/86  Pulse: 99  Weight: 204 lb (92.5 kg)  Height: 6' (1.829 m)    GEN- The patient is well appearing, alert and oriented x 3 today.   Head- normocephalic, atraumatic Eyes-  Sclera clear, conjunctiva pink Ears- hearing intact Oropharynx- clear Neck- supple, no JVP Lymph- no cervical lymphadenopathy Lungs- Clear to ausculation bilaterally, normal work of breathing Heart-  Irregular  rate and rhythm, no murmurs, rubs or gallops, PMI not laterally  displaced GI- soft, NT, ND, + BS Extremities- no clubbing, cyanosis, or trace edema shin area to 1+ top of feet MS- no significant deformity or atrophy Skin- no rash or lesion Psych- euthymic mood, full affect Neuro- strength and sensation are intact  Ekg-afib at 99 bpm,qrs int 118 bpm, qtc 487 ms Epic records reviewed   Assessment and Plan:  1. Persistent atrial fibrillation x 2 weeks  Associated with URI/decongestatns Currently rate controlled  Continue metoprolol 100 mg bid  I will plan on cardioversion after 3 weeks of uninterrupted anticoagulation and pt has a little more time to recover from URI Avoid decongestants  Will need to reduce BB back to 25 mg bid with cardioversion to prevent bradycardia in SR Continue lasix 20 mg daily Bmet/mag today  2. Chadsvasc score of at least 2. Continue eliquis, no bleeding issues No missed doses with pending cardioversion  3. HTN Stable   Return  In 2 weeks , sooner if symptoms  worsen  Geroge Baseman. Aaryan Essman, [facility] [address] [place], [place] [address] [phone]

## 2017-12-03 ENCOUNTER — Emergency Department (HOSPITAL_COMMUNITY): Payer: Medicare Other

## 2017-12-03 ENCOUNTER — Encounter (HOSPITAL_COMMUNITY): Payer: Self-pay | Admitting: Emergency Medicine

## 2017-12-03 ENCOUNTER — Other Ambulatory Visit: Payer: Self-pay

## 2017-12-03 DIAGNOSIS — N179 Acute kidney failure, unspecified: Secondary | ICD-10-CM | POA: Diagnosis not present

## 2017-12-03 DIAGNOSIS — I5033 Acute on chronic diastolic (congestive) heart failure: Secondary | ICD-10-CM | POA: Diagnosis present

## 2017-12-03 DIAGNOSIS — I110 Hypertensive heart disease with heart failure: Secondary | ICD-10-CM | POA: Diagnosis not present

## 2017-12-03 DIAGNOSIS — R0609 Other forms of dyspnea: Secondary | ICD-10-CM | POA: Diagnosis not present

## 2017-12-03 DIAGNOSIS — Z91013 Allergy to seafood: Secondary | ICD-10-CM

## 2017-12-03 DIAGNOSIS — I340 Nonrheumatic mitral (valve) insufficiency: Secondary | ICD-10-CM | POA: Diagnosis present

## 2017-12-03 DIAGNOSIS — E876 Hypokalemia: Secondary | ICD-10-CM | POA: Diagnosis not present

## 2017-12-03 DIAGNOSIS — Z87891 Personal history of nicotine dependence: Secondary | ICD-10-CM

## 2017-12-03 DIAGNOSIS — I700 Atherosclerosis of aorta: Secondary | ICD-10-CM | POA: Diagnosis not present

## 2017-12-03 DIAGNOSIS — I493 Ventricular premature depolarization: Secondary | ICD-10-CM | POA: Diagnosis not present

## 2017-12-03 DIAGNOSIS — R0600 Dyspnea, unspecified: Secondary | ICD-10-CM | POA: Diagnosis not present

## 2017-12-03 DIAGNOSIS — J90 Pleural effusion, not elsewhere classified: Secondary | ICD-10-CM | POA: Diagnosis not present

## 2017-12-03 DIAGNOSIS — Z7901 Long term (current) use of anticoagulants: Secondary | ICD-10-CM

## 2017-12-03 DIAGNOSIS — I481 Persistent atrial fibrillation: Secondary | ICD-10-CM | POA: Diagnosis not present

## 2017-12-03 DIAGNOSIS — I509 Heart failure, unspecified: Secondary | ICD-10-CM | POA: Diagnosis not present

## 2017-12-03 DIAGNOSIS — E785 Hyperlipidemia, unspecified: Secondary | ICD-10-CM | POA: Diagnosis present

## 2017-12-03 DIAGNOSIS — Z888 Allergy status to other drugs, medicaments and biological substances status: Secondary | ICD-10-CM

## 2017-12-03 DIAGNOSIS — R0602 Shortness of breath: Secondary | ICD-10-CM | POA: Diagnosis not present

## 2017-12-03 DIAGNOSIS — I4891 Unspecified atrial fibrillation: Secondary | ICD-10-CM | POA: Diagnosis not present

## 2017-12-03 DIAGNOSIS — Z8249 Family history of ischemic heart disease and other diseases of the circulatory system: Secondary | ICD-10-CM

## 2017-12-03 LAB — CBC
HEMATOCRIT: 48.4 % (ref 39.0–52.0)
Hemoglobin: 15.9 g/dL (ref 13.0–17.0)
MCH: 28.8 pg (ref 26.0–34.0)
MCHC: 32.9 g/dL (ref 30.0–36.0)
MCV: 87.5 fL (ref 78.0–100.0)
PLATELETS: 205 10*3/uL (ref 150–400)
RBC: 5.53 MIL/uL (ref 4.22–5.81)
RDW: 14.7 % (ref 11.5–15.5)
WBC: 11.1 10*3/uL — AB (ref 4.0–10.5)

## 2017-12-03 LAB — BASIC METABOLIC PANEL
Anion gap: 11 (ref 5–15)
BUN: 12 mg/dL (ref 6–20)
CHLORIDE: 101 mmol/L (ref 101–111)
CO2: 21 mmol/L — ABNORMAL LOW (ref 22–32)
CREATININE: 1.25 mg/dL — AB (ref 0.61–1.24)
Calcium: 8.7 mg/dL — ABNORMAL LOW (ref 8.9–10.3)
GFR calc Af Amer: >60 mL/min (ref >60)
GFR calc non Af Amer: 56 mL/min — ABNORMAL LOW (ref >60)
Glucose, Bld: 116 mg/dL — ABNORMAL HIGH (ref 65–99)
POTASSIUM: 3.9 mmol/L (ref 3.5–5.1)
SODIUM: 133 mmol/L — AB (ref 135–145)

## 2017-12-03 NOTE — ED Triage Notes (Signed)
Reports having afib and hypertension worse for the last month.  Seen by cardiology last Wednesday.  Plan to do a cardioversion but wanted to wait due to patient being on an antibiotic.  Last dose due tomorrow.  Was taking for URI.  Reports feeling SOB.

## 2017-12-04 ENCOUNTER — Inpatient Hospital Stay (HOSPITAL_COMMUNITY): Payer: Medicare Other

## 2017-12-04 ENCOUNTER — Inpatient Hospital Stay (HOSPITAL_COMMUNITY)
Admission: EM | Admit: 2017-12-04 | Discharge: 2017-12-05 | DRG: 308 | Disposition: A | Discharge status: Home / Self Care | Payer: Medicare Other | Attending: Internal Medicine | Admitting: Internal Medicine

## 2017-12-04 ENCOUNTER — Emergency Department (HOSPITAL_COMMUNITY): Payer: Medicare Other

## 2017-12-04 ENCOUNTER — Other Ambulatory Visit: Payer: Self-pay

## 2017-12-04 ENCOUNTER — Ambulatory Visit: Payer: Medicare Other | Admitting: Internal Medicine

## 2017-12-04 ENCOUNTER — Encounter (HOSPITAL_COMMUNITY): Payer: Self-pay | Admitting: Internal Medicine

## 2017-12-04 DIAGNOSIS — I5020 Unspecified systolic (congestive) heart failure: Secondary | ICD-10-CM

## 2017-12-04 DIAGNOSIS — Z888 Allergy status to other drugs, medicaments and biological substances status: Secondary | ICD-10-CM | POA: Diagnosis not present

## 2017-12-04 DIAGNOSIS — I700 Atherosclerosis of aorta: Secondary | ICD-10-CM | POA: Diagnosis not present

## 2017-12-04 DIAGNOSIS — Z8249 Family history of ischemic heart disease and other diseases of the circulatory system: Secondary | ICD-10-CM | POA: Diagnosis not present

## 2017-12-04 DIAGNOSIS — I5031 Acute diastolic (congestive) heart failure: Secondary | ICD-10-CM | POA: Diagnosis not present

## 2017-12-04 DIAGNOSIS — I340 Nonrheumatic mitral (valve) insufficiency: Secondary | ICD-10-CM

## 2017-12-04 DIAGNOSIS — I4891 Unspecified atrial fibrillation: Secondary | ICD-10-CM | POA: Diagnosis present

## 2017-12-04 DIAGNOSIS — N179 Acute kidney failure, unspecified: Secondary | ICD-10-CM | POA: Diagnosis present

## 2017-12-04 DIAGNOSIS — I509 Heart failure, unspecified: Secondary | ICD-10-CM

## 2017-12-04 DIAGNOSIS — I480 Paroxysmal atrial fibrillation: Secondary | ICD-10-CM | POA: Diagnosis not present

## 2017-12-04 DIAGNOSIS — Z91013 Allergy to seafood: Secondary | ICD-10-CM | POA: Diagnosis not present

## 2017-12-04 DIAGNOSIS — Z87891 Personal history of nicotine dependence: Secondary | ICD-10-CM | POA: Diagnosis not present

## 2017-12-04 DIAGNOSIS — I481 Persistent atrial fibrillation: Secondary | ICD-10-CM | POA: Diagnosis present

## 2017-12-04 DIAGNOSIS — E785 Hyperlipidemia, unspecified: Secondary | ICD-10-CM | POA: Diagnosis present

## 2017-12-04 DIAGNOSIS — E876 Hypokalemia: Secondary | ICD-10-CM | POA: Diagnosis not present

## 2017-12-04 DIAGNOSIS — R0600 Dyspnea, unspecified: Secondary | ICD-10-CM | POA: Diagnosis not present

## 2017-12-04 DIAGNOSIS — Z7901 Long term (current) use of anticoagulants: Secondary | ICD-10-CM | POA: Diagnosis not present

## 2017-12-04 DIAGNOSIS — R0609 Other forms of dyspnea: Secondary | ICD-10-CM | POA: Diagnosis not present

## 2017-12-04 DIAGNOSIS — I110 Hypertensive heart disease with heart failure: Secondary | ICD-10-CM | POA: Diagnosis present

## 2017-12-04 DIAGNOSIS — I10 Essential (primary) hypertension: Secondary | ICD-10-CM | POA: Diagnosis present

## 2017-12-04 DIAGNOSIS — I5033 Acute on chronic diastolic (congestive) heart failure: Secondary | ICD-10-CM | POA: Diagnosis present

## 2017-12-04 DIAGNOSIS — I493 Ventricular premature depolarization: Secondary | ICD-10-CM | POA: Diagnosis not present

## 2017-12-04 DIAGNOSIS — I5021 Acute systolic (congestive) heart failure: Secondary | ICD-10-CM | POA: Diagnosis not present

## 2017-12-04 HISTORY — DX: Other persistent atrial fibrillation: I48.19

## 2017-12-04 LAB — I-STAT TROPONIN, ED: Troponin i, poc: 0 ng/mL (ref 0.00–0.08)

## 2017-12-04 LAB — CBC
HEMATOCRIT: 48 % (ref 39.0–52.0)
Hemoglobin: 16.2 g/dL (ref 13.0–17.0)
MCH: 29.1 pg (ref 26.0–34.0)
MCHC: 33.8 g/dL (ref 30.0–36.0)
MCV: 86.2 fL (ref 78.0–100.0)
Platelets: 199 10*3/uL (ref 150–400)
RBC: 5.57 MIL/uL (ref 4.22–5.81)
RDW: 14.8 % (ref 11.5–15.5)
WBC: 10.1 10*3/uL (ref 4.0–10.5)

## 2017-12-04 LAB — URINALYSIS, ROUTINE W REFLEX MICROSCOPIC
BILIRUBIN URINE: NEGATIVE
GLUCOSE, UA: NEGATIVE mg/dL
HGB URINE DIPSTICK: NEGATIVE
Ketones, ur: NEGATIVE mg/dL
Leukocytes, UA: NEGATIVE
Nitrite: NEGATIVE
Protein, ur: NEGATIVE mg/dL
SPECIFIC GRAVITY, URINE: 1.008 (ref 1.005–1.030)
pH: 7 (ref 5.0–8.0)

## 2017-12-04 LAB — TSH: TSH: 1.66 u[IU]/mL (ref 0.350–4.500)

## 2017-12-04 LAB — TROPONIN I: Troponin I: <0.03 ng/mL (ref <0.03)

## 2017-12-04 LAB — SODIUM, URINE, RANDOM: SODIUM UR: 117 mmol/L

## 2017-12-04 LAB — BRAIN NATRIURETIC PEPTIDE: B Natriuretic Peptide: 574.7 pg/mL — ABNORMAL HIGH (ref 0.0–100.0)

## 2017-12-04 LAB — CREATININE, URINE, RANDOM: Creatinine, Urine: <10 mg/dL

## 2017-12-04 LAB — ECHOCARDIOGRAM COMPLETE

## 2017-12-04 LAB — MRSA PCR SCREENING: MRSA by PCR: NEGATIVE

## 2017-12-04 LAB — MAGNESIUM: Magnesium: 1.9 mg/dL (ref 1.7–2.4)

## 2017-12-04 MED ORDER — FUROSEMIDE 10 MG/ML IJ SOLN
40.0000 mg | Freq: Once | INTRAMUSCULAR | Status: AC
  Administered 2017-12-04: 40 mg via INTRAVENOUS
  Filled 2017-12-04: qty 4

## 2017-12-04 MED ORDER — VITAMIN B-12 5000 MCG SL SUBL: 5000.0000 ug | SUBLINGUAL_TABLET | Freq: Every day | SUBLINGUAL | Status: DC

## 2017-12-04 MED ORDER — ONDANSETRON HCL 4 MG PO TABS: 4.0000 mg | ORAL_TABLET | Freq: Four times a day (QID) | ORAL | Status: DC | PRN

## 2017-12-04 MED ORDER — QUINAPRIL HCL 10 MG PO TABS
40.0000 mg | ORAL_TABLET | Freq: Every morning | ORAL | Status: DC
  Administered 2017-12-04 – 2017-12-05 (×2): 40 mg via ORAL
  Filled 2017-12-04 (×3): qty 4

## 2017-12-04 MED ORDER — METOPROLOL TARTRATE 50 MG PO TABS
100.0000 mg | ORAL_TABLET | Freq: Two times a day (BID) | ORAL | Status: DC
  Administered 2017-12-04 – 2017-12-05 (×3): 100 mg via ORAL
  Filled 2017-12-04: qty 4
  Filled 2017-12-04 (×2): qty 2

## 2017-12-04 MED ORDER — DILTIAZEM HCL-DEXTROSE 100-5 MG/100ML-% IV SOLN (PREMIX)
5.0000 mg/h | INTRAVENOUS | Status: DC
  Administered 2017-12-04 (×2): 5 mg/h via INTRAVENOUS
  Filled 2017-12-04 (×3): qty 100

## 2017-12-04 MED ORDER — SALINE SPRAY 0.65 % NA SOLN
1.0000 | NASAL | Status: DC | PRN
  Administered 2017-12-04: 1 via NASAL
  Filled 2017-12-04 (×2): qty 44

## 2017-12-04 MED ORDER — FUROSEMIDE 10 MG/ML IJ SOLN
40.0000 mg | Freq: Two times a day (BID) | INTRAMUSCULAR | Status: DC
  Administered 2017-12-04 (×2): 40 mg via INTRAVENOUS
  Filled 2017-12-04 (×3): qty 4

## 2017-12-04 MED ORDER — IOPAMIDOL (ISOVUE-370) INJECTION 76%
INTRAVENOUS | Status: AC
  Administered 2017-12-04: 100 mL
  Filled 2017-12-04: qty 100

## 2017-12-04 MED ORDER — DOCUSATE SODIUM 100 MG PO CAPS
200.0000 mg | ORAL_CAPSULE | Freq: Every day | ORAL | Status: DC
  Administered 2017-12-04: 200 mg via ORAL
  Filled 2017-12-04: qty 2

## 2017-12-04 MED ORDER — APIXABAN 5 MG PO TABS: 5.0000 mg | ORAL_TABLET | Freq: Two times a day (BID) | ORAL | Status: DC

## 2017-12-04 MED ORDER — APIXABAN 5 MG PO TABS
5.0000 mg | ORAL_TABLET | Freq: Two times a day (BID) | ORAL | Status: DC
  Administered 2017-12-04 – 2017-12-05 (×3): 5 mg via ORAL
  Filled 2017-12-04 (×4): qty 1

## 2017-12-04 MED ORDER — DILTIAZEM LOAD VIA INFUSION
15.0000 mg | Freq: Once | INTRAVENOUS | Status: AC
Start: 2017-12-04 — End: 2017-12-04
  Administered 2017-12-04: 15 mg via INTRAVENOUS
  Filled 2017-12-04: qty 15

## 2017-12-04 MED ORDER — ACETAMINOPHEN 325 MG PO TABS: 650.0000 mg | ORAL_TABLET | Freq: Four times a day (QID) | ORAL | Status: DC | PRN

## 2017-12-04 MED ORDER — ONDANSETRON HCL 4 MG/2ML IJ SOLN: 4.0000 mg | Freq: Four times a day (QID) | INTRAMUSCULAR | Status: DC | PRN

## 2017-12-04 MED ORDER — ACETAMINOPHEN 650 MG RE SUPP: 650.0000 mg | Freq: Four times a day (QID) | RECTAL | Status: DC | PRN

## 2017-12-04 MED ORDER — DOXYCYCLINE HYCLATE 100 MG PO TABS
100.0000 mg | ORAL_TABLET | Freq: Once | ORAL | Status: DC
  Filled 2017-12-04: qty 1

## 2017-12-04 NOTE — Consult Note (Addendum)
ELECTROPHYSIOLOGY CONSULT NOTE    Patient ID: Sergio Mack MRN: [id number], DOB/AGE: 1945/12/23 72 y.o.  Admit date: 12/04/2017 Date of Consult: 12/04/2017  Primary Physician: Binnie Rail, MD Electrophysiologist: Rayann Heman  Patient Profile: Sergio Mack is a 72 y.o. male with a history of persistent AF and HTN who is being seen today for the evaluation of AF at the request of Dr Hal Hope.  HPI:  Sergio Mack is a 72 y.o. male who is primarily followed in the AF clinic.  He was last seen by Dr Rayann Heman in 2015.  At last visit in AF clinic, he was noted to have recurrent AF and increased shortness of breath in the setting of recent URI. Concern was that he had missed a few doses of Eliquis and plan was for 3 weeks of uninterrupted [name] followed by [facility].  He developed worsening shortness of breath and presented to the ER last night.  He was found to be in AF with RVR and has been placed on diltiazem drip with some improvement in heart rate. EP has been asked to evaluate for treatment options. He is not sure that he hasn't missed doses of Eliquis in the last 3 weeks.  Echo 02/2014 demonstrated EF 50-55%, no RWMA Myoview 02/2014 low risk  He denies chest pain, palpitations, nausea, vomiting, dizziness, syncope, edema, weight gain, or early satiety.  Past Medical History:  Diagnosis Date  . Diverticulosis 2006  . Erectile dysfunction   . GERD (gastroesophageal reflux disease)   . History of skin cancer   . History of syncope 1994   no etiology; no recurrence  . Hyperlipidemia    LDL goal = < 100, ideally < 70  . Hypertension   . Persistent atrial fibrillation ([place]) 03/06/2014  . PVC's (premature ventricular contractions)   . Rotator cuff tear      Surgical History:  Past Surgical History:  Procedure Laterality Date  . COLONOSCOPY  2006   Diverticulosis  . CYSTECTOMY     lip  . CYSTECTOMY     anterior thorax-chest  . INGUINAL HERNIA REPAIR      x 2  . PENILE  PROSTHESIS IMPLANT N/A 12/09/2016   Procedure: PENILE PROTHESIS INFLATABLE THREE PIECE COLOPLAST;  Surgeon: Kathie Rhodes, MD;  Location: [facility];  Service: Urology;  Laterality: N/A;  . SHOULDER OPEN ROTATOR CUFF REPAIR Right 04/23/2014   Procedure: RIGHT SHOULDER ROTATOR CUFF REPAIR WITH GRAFT AND ANCHORS . OPEN ACROMIONECTOMY;  Surgeon: Tobi Bastos, MD;  Location: [facility];  Service: Orthopedics;  Laterality: Right;  . SPINE SURGERY  2012   Dr Gladstone Lighter  . TONSILLECTOMY AND ADENOIDECTOMY        (Not in a hospital admission)  Inpatient Medications:  . apixaban  5 mg Oral BID  . doxycycline  100 mg Oral Once  . furosemide  40 mg Intravenous Q12H  . metoprolol tartrate  100 mg Oral BID  . quinapril  40 mg Oral q morning - 10a  . [START ON 12/06/2017] Vitamin B-12  5,000 mcg Sublingual Daily    Allergies:  Allergies  Allergen Reactions  . Glucosamine Itching    Itching feet and palms, tightness in chest  . Shellfish-Derived Products Itching    Itching feet and palms, tightness in chest    Social History   Socioeconomic History  . Marital status: Married    Spouse name: Not on file  . Number of children: Not on file  . Years of education: Not  on file  . Highest education level: Not on file  Social Needs  . Financial resource strain: Not on file  . Food insecurity - worry: Not on file  . Food insecurity - inability: Not on file  . Transportation needs - medical: Not on file  . Transportation needs - non-medical: Not on file  Occupational History  . Not on file  Tobacco Use  . Smoking status: Former Smoker    Last attempt to quit: 04/26/1990    Years since quitting: 27.6  . Smokeless tobacco: Former Systems developer  . Tobacco comment: smoked [place] with 12 year abstinence ( 21 total years of smoking), up to 1 ppd  Substance and Sexual Activity  . Alcohol use: No    Alcohol/week: 0.0 oz  . Drug use: No  . Sexual activity: Not on file  Other Topics Concern  . Not on file    Social History Narrative   No regular exercise     Family History  Problem Relation Age of Onset  . Heart attack Father 88  . Diabetes Mother   . Transient ischemic attack Mother 48  . Diabetes Sister   . Heart attack Sister 51  . Stomach cancer Maternal Grandfather   . Cirrhosis Paternal Uncle        alcoholic  . Colon cancer Neg Hx   . Colon polyps Neg Hx      Review of Systems: All other systems reviewed and are otherwise negative except as noted above.  Physical Exam: Vitals:   12/04/17 0800 12/04/17 0830 12/04/17 0900 12/04/17 0927  BP: 137/83 (!) 131/91 126/87 126/87  Pulse: (!) 112 64 94 (!) 101  Resp: (!) 24 (!) 26 (!) 21   Temp:      TempSrc:      SpO2: 96% 97% 96%   Weight:      Height:        GEN- The patient is well appearing, alert and oriented x 3 today.   HEENT: normocephalic, atraumatic; sclera clear, conjunctiva pink; hearing intact; oropharynx clear; neck supple Lungs- Clear to ausculation bilaterally, normal work of breathing.  No wheezes, rales, rhonchi Heart- Irregular rate and rhythm  GI- soft, non-tender, non-distended, bowel sounds present Extremities- no clubbing, cyanosis, or edema MS- no significant deformity or atrophy Skin- warm and dry, no rash or lesion Psych- euthymic mood, full affect Neuro- strength and sensation are intact  Labs:   Lab Results  Component Value Date   WBC 10.1 12/04/2017   HGB 16.2 12/04/2017   HCT 48.0 12/04/2017   MCV 86.2 12/04/2017   PLT 199 12/04/2017    Recent Labs  Lab 12/03/17 2243  NA 133*  K 3.9  CL 101  CO2 21*  BUN 12  CREATININE 1.25*  CALCIUM 8.7*  GLUCOSE 116*      Radiology/Studies: Dg Chest 2 View  Result Date: 12/03/2017 CLINICAL DATA:  Atrial fibrillation for 1 month. Recent upper respiratory tract infection. EXAM: CHEST  2 VIEW COMPARISON:  Chest radiograph November 24, 2017 FINDINGS: Cardiac silhouette is upper limits of normal, calcified aortic knob. Similar small RIGHT and  small to moderate LEFT pleural effusions. Similar chronic interstitial changes and bibasilar strandy densities. RIGHT middle lobe atelectasis. Increased lung volumes with flattened hemidiaphragms. No pneumothorax. Soft tissue planes and included osseous structures are nonsuspicious. Moderate degenerative change of thoracic spine. IMPRESSION: COPD. Similar pleural effusions, bibasilar atelectasis including RIGHT middle lobe atelectasis. Recommend follow-up CT chest on a nonemergent basis to exclude  obstructing lesion. Electronically Signed   By: Elon Alas M.D.   On: 12/03/2017 23:14   Dg Chest 2 View  Result Date: 11/24/2017 CLINICAL DATA:  Shortness of breath. EXAM: CHEST  2 VIEW COMPARISON:  Radiographs of December 05, 2016. FINDINGS: Stable cardiomediastinal silhouette. Atherosclerosis of thoracic aorta is noted. No pneumothorax is noted. Mild bilateral pleural effusions are noted with associated atelectasis or infiltrates. Bony thorax is unremarkable. IMPRESSION: Aortic atherosclerosis. Mild bilateral pleural effusions with associated subsegmental atelectasis or infiltrates. Electronically Signed   By: Marijo Conception, M.D.   On: 11/24/2017 10:49   Ct Angio Chest Pe W/cm &/or Wo Cm  Result Date: 12/04/2017 CLINICAL DATA:  Dyspnea.  Anticoagulated. EXAM: CT ANGIOGRAPHY CHEST WITH CONTRAST TECHNIQUE: Multidetector CT imaging of the chest was performed using the standard protocol during bolus administration of intravenous contrast. Multiplanar CT image reconstructions and MIPs were obtained to evaluate the vascular anatomy. CONTRAST:  125mL ISOVUE-370 IOPAMIDOL (ISOVUE-370) INJECTION 76% COMPARISON:  Chest radiograph from one day prior. FINDINGS: Cardiovascular: The study is moderate quality for the evaluation of pulmonary embolism, with evaluation of the segmental/subsegmental pulmonary arteries limited by motion artifact. There are no filling defects in the central, lobar, segmental or subsegmental  pulmonary artery branches to suggest acute pulmonary embolism. Atherosclerotic nonaneurysmal thoracic aorta. Top-normal caliber main pulmonary artery (3.1 cm diameter). Mild cardiomegaly. No significant pericardial fluid/thickening. Three-vessel coronary atherosclerosis. Mediastinum/Nodes: No discrete thyroid nodules. Unremarkable esophagus. No pathologically enlarged axillary, mediastinal or hilar lymph nodes. Lungs/Pleura: No pneumothorax. Small to moderate right and small left dependent bilateral pleural effusions. Moderate atelectasis in the medial/basilar lower lobes bilaterally. No acute consolidative airspace disease, lung masses or significant pulmonary nodules in the aerated portions of the lungs. Minimal interlobular septal thickening in both lungs. Upper abdomen: There is reflux of contrast into the IVC and hepatic veins. Small hiatal hernia. Musculoskeletal: No aggressive appearing focal osseous lesions. Moderate thoracic spondylosis. Review of the MIP images confirms the above findings. IMPRESSION: 1. No evidence of pulmonary embolism. 2. Spectrum of findings compatible with congestive heart failure, including cardiomegaly, minimal interlobular septal thickening, small to moderate right and small left dependent pleural effusions and contrast reflux into the IVC/patent veins. 3. Moderate bibasilar atelectasis. 4. Three-vessel coronary atherosclerosis. 5. Small hiatal hernia. Aortic Atherosclerosis (ICD10-I70.0). Electronically Signed   By: Ilona Sorrel M.D.   On: 12/04/2017 02:18    EKG:AF with RVR (personally reviewed)  TELEMETRY: AF (personally reviewed)  Assessment/Plan: 1.  Persistent AF The patient has symptomatic persistent atrial fibrillation  Continue Eliquis long term for CHADS2VASC of 2 He is not sure he has not missed doses of Eliquis for last 3 weeks. We discussed extensively today.  He has also eaten breakfast this morning. Bryley Chrisman plan TEE/DCCV to restore SR. Risks, benefits  reviewed with patient who wishes to proceed.  Madalena Kesecker schedule for the next available time.   2.  Acute on chronic diastolic heart failure Likely worsened by AF Agree with IV diuresis Recheck echo  3.  HTN Stable No change required today  Dr Curt Bears to see later today    Signed, Chanetta Marshall, NP 12/04/2017 9:33 AM   I have seen and examined this patient with Chanetta Marshall.  Agree with above, note added to reflect my findings.  On exam, iRRR, no murmurs, lungs clear.  Presented to the hospital after feeling short of breath.  Has been in atrial fibrillation in the past and is waiting for cardioversion as he does not remember if he  has had Eliquis.  Due to his presentation to the hospital, Toren Tucholski plan for TEE and cardioversion tomorrow.  We Myrene Bougher keep him n.p.o. after midnight.  Ireland Chagnon M. Whitnee Orzel MD 12/04/2017 2:43 PM

## 2017-12-04 NOTE — ED Provider Notes (Signed)
[facility] EMERGENCY DEPARTMENT Provider Note   CSN: [id number] Arrival date & time: 12/03/17  2238     History   Chief Complaint Chief Complaint  Patient presents with  . Atrial Fibrillation  . Hypertension    HPI Sergio Mack is a 72 y.o. male.  Sergio Mack is a 72 y.o. Male who presents to the emergency department complaining of worsening shortness of breath over the past several weeks.  Patient reports that he has had worsening trouble breathing over the past 3 weeks.  He has to sit upright and has trouble sleeping at night.  He reports his shortness of breath is much worse with exertion and he can only walk short distances without feeling very winded.  He denies any chest pain.  He reports ongoing problems with atrial fibrillation and there is a plan by cardiology to do a cardioversion at some point.  He is currently on Eliquis for anticoagulation.  He reports he is been compliant with this medication.  He tells me over the past 3 weeks he is also had some problems with some peripheral edema that first started in his left leg.  He reports they started him on Lasix about 2 weeks ago and he denies history of congestive heart failure.  He reports his primary care doctor and his cardiologist have had to increase his metoprolol to help with rate control and blood pressure.  He reports he is gone from 25 mg of metoprolol twice a day to 100 mg of metoprolol twice a day.  He denies of fevers, coughing, hemoptysis, leg pain, abdominal pain, nausea, vomiting, chest pain, rashes or syncope.   The history is provided by the patient, medical records and the spouse. No language interpreter was used.  Atrial Fibrillation  Associated symptoms include shortness of breath. Pertinent negatives include no chest pain, no abdominal pain and no headaches.  Hypertension  Associated symptoms include shortness of breath. Pertinent negatives include no chest pain, no abdominal pain and  no headaches.    Past Medical History:  Diagnosis Date  . Anticoagulation adequate, new on eliquis 03/07/2014  . Atrial fibrillation with RVR, new onset, CHA2DS2-VASc score 2- back to SR at discharge 03/06/2014  . Diverticulosis 2006  . Dysrhythmia    Hx A-Fib from 12/2013-new onset- on meds  . Erectile dysfunction   . GERD (gastroesophageal reflux disease)   . History of skin cancer   . History of syncope 1994   no etiology; no recurrence  . Hyperlipidemia    LDL goal = < 100, ideally < 70  . Hypertension   . PVC's (premature ventricular contractions)   . Rotator cuff tear     Patient Active Problem List   Diagnosis Date Noted  . Acute CHF (congestive heart failure) ([place]) 12/04/2017  . Atrial fibrillation with RVR ([place]) 12/04/2017  . ARF (acute renal failure) ([place]) 12/04/2017  . SOB (shortness of breath) 11/24/2017  . Bilateral leg edema 11/24/2017  . Memory difficulties 10/26/2017  . Cramping of feet 04/24/2017  . Headache 04/24/2017  . 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 Sensorineural hearing loss (SNHL), 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  . Prediabetes 04/07/2016  . Full thickness rotator cuff tear 04/23/2014  . Anticoagulation adequate, new on eliquis 03/07/2014  . Paroxysmal atrial fibrillation ([place]) 03/06/2014  . PREMATURE VENTRICULAR CONTRACTIONS, FREQUENT 05/13/2010  . SKIN CANCER, HX OF 03/24/2010  . DIVERTICULOSIS, COLON 12/11/2008  . SPINAL STENOSIS 01/14/2008  . BACK PAIN, LUMBAR, WITH RADICULOPATHY 01/02/2008  .  HYPERLIPIDEMIA 10/31/2007  . 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 Essential hypertension 10/31/2007  . 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 GERD 09/27/2007    Past Surgical History:  Procedure Laterality Date  . COLONOSCOPY  2006   Diverticulosis  . CYSTECTOMY     lip  . CYSTECTOMY     anterior thorax-chest  . INGUINAL HERNIA REPAIR      x 2  . PENILE PROSTHESIS IMPLANT N/A 12/09/2016   Procedure: PENILE  PROTHESIS INFLATABLE THREE PIECE COLOPLAST;  Surgeon: Kathie Rhodes, MD;  Location: [facility];  Service: Urology;  Laterality: N/A;  . SHOULDER OPEN ROTATOR CUFF REPAIR Right 04/23/2014   Procedure: RIGHT SHOULDER ROTATOR CUFF REPAIR WITH GRAFT AND ANCHORS . OPEN ACROMIONECTOMY;  Surgeon: Tobi Bastos, MD;  Location: [facility];  Service: Orthopedics;  Laterality: Right;  . SPINE SURGERY  2012   Dr Gladstone Lighter  . TONSILLECTOMY AND ADENOIDECTOMY         Home Medications    Prior to Admission medications   Medication Sig Start Date End Date Taking? Authorizing Provider  apixaban (ELIQUIS) 5 MG TABS tablet Take 1 tablet (5 mg total) by mouth 2 (two) times daily. 06/01/17  Yes Sherran Needs, NP  cholecalciferol (VITAMIN D) 1000 units tablet Take 1,000 Units by mouth daily.   Yes [provider]  Cyanocobalamin (VITAMIN B-12) 5000 MCG SUBL Place 5,000 mcg under the tongue daily.   Yes [provider]  doxycycline (VIBRA-TABS) 100 MG tablet Take 1 tablet (100 mg total) by mouth 2 (two) times daily. 11/24/17  Yes Burns, Claudina Lick, MD  furosemide (LASIX) 20 MG tablet Take 1 tablet (20 mg total) by mouth daily. 11/29/17  Yes Sherran Needs, NP  metoprolol tartrate (LOPRESSOR) 50 MG tablet Take 2 tablets (100 mg total) by mouth 2 (two) times daily. 11/24/17  Yes Burns, Claudina Lick, MD  Multiple Vitamins-Minerals (ICAPS AREDS 2 PO) Take 1 capsule by mouth 2 (two) times daily.   Yes [provider]  quinapril (ACCUPRIL) 40 MG tablet Take 1 tablet (40 mg total) by mouth every morning. 06/01/17  Yes Sherran Needs, NP  Zinc 50 MG CAPS Take 1 capsule by mouth every 3 (three) days.   Yes [provider]    Family History Family History  Problem Relation Age of Onset  . Heart attack Father 1  . Diabetes Mother   . Transient ischemic attack Mother 18  . Diabetes Sister   . Heart attack Sister 60  . Stomach cancer Maternal Grandfather   . Cirrhosis Paternal Uncle        alcoholic   . Colon cancer Neg Hx   . Colon polyps Neg Hx     Social History Social History   Tobacco Use  . Smoking status: Former Smoker    Last attempt to quit: 04/26/1990    Years since quitting: 27.6  . Smokeless tobacco: Former Systems developer  . Tobacco comment: smoked [employer] with 12 year abstinence ( 21 total years of smoking), up to 1 ppd  Substance Use Topics  . Alcohol use: No    Alcohol/week: 0.0 oz  . Drug use: No     Allergies   Glucosamine and Shellfish-derived products   Review of Systems Review of Systems  Constitutional: Negative for chills and fever.  HENT: Negative for congestion and sore throat.   Eyes: Negative for visual disturbance.  Respiratory: Positive for shortness of breath. Negative for  cough and wheezing.   Cardiovascular: Positive for leg swelling. Negative for chest pain and palpitations.  Gastrointestinal: Negative for abdominal pain, diarrhea, nausea and vomiting.  Genitourinary: Negative for dysuria.  Musculoskeletal: Negative for back pain and neck pain.  Skin: Negative for rash.  Neurological: Negative for syncope, weakness and headaches.     Physical Exam Updated Vital Signs BP (!) 123/102   Pulse 78   Temp 97.9 F (36.6 C) (Oral)   Resp 19   Ht 6' (1.829 m)   Wt 88.5 kg (195 lb)   SpO2 96%   BMI 26.45 kg/m   Physical Exam  Constitutional: He appears well-developed and well-nourished. No distress.  Nontoxic-appearing.  HENT:  Head: Normocephalic and atraumatic.  Mouth/Throat: Oropharynx is clear and moist.  Eyes: Conjunctivae are normal. Pupils are equal, round, and reactive to light. Right eye exhibits no discharge. Left eye exhibits no discharge.  Neck: Neck supple. JVD present.  Cardiovascular: Normal heart sounds and intact distal pulses. Exam reveals no gallop and no friction rub.  No murmur heard. Heart rate between 104 and 124 on my exam.  A. fib on the monitor.  Irregular regular rhythm. Bilateral radial, posterior tibialis  and dorsalis pedis pulses are intact.    Pulmonary/Chest: Effort normal. No respiratory distress. He has no wheezes. He has no rales.  Crackles and diminished lung sounds to his bilateral bases.  No increased work of breathing.  No rales or rhonchi.  Symmetric chest expansion bilaterally.  Abdominal: Soft. There is no tenderness. There is no guarding.  Musculoskeletal: He exhibits edema. He exhibits no tenderness.  Trace bilateral lower extremity edema that seems to be slightly worse on the left than the right.  No calf tenderness to palpation bilaterally.  Lymphadenopathy:    He has no cervical adenopathy.  Neurological: He is alert. Coordination normal.  Skin: Skin is warm and dry. Capillary refill takes less than 2 seconds. No rash noted. He is not diaphoretic. No erythema. No pallor.  Psychiatric: He has a normal mood and affect. His behavior is normal.  Nursing note and vitals reviewed.    ED Treatments / Results  Labs (all labs ordered are listed, but only abnormal results are displayed) Labs Reviewed  BASIC METABOLIC PANEL - Abnormal; Notable for the following components:      Result Value   Sodium 133 (*)    CO2 21 (*)    Glucose, Bld 116 (*)    Creatinine, Ser 1.25 (*)    Calcium 8.7 (*)    GFR calc non Af Amer 56 (*)    All other components within normal limits  CBC - Abnormal; Notable for the following components:   WBC 11.1 (*)    All other components within normal limits  BRAIN NATRIURETIC PEPTIDE - Abnormal; Notable for the following components:   B Natriuretic Peptide 574.7 (*)    All other components within normal limits  I-STAT TROPONIN, ED    EKG  EKG Interpretation  Date/Time:  Sunday December 03 2017 22:41:08 EST Ventricular Rate:  116 PR Interval:    QRS Duration: 114 QT Interval:  334 QTC Calculation: 464 R Axis:   53 Text Interpretation:  Atrial fibrillation with rapid ventricular response with premature ventricular or aberrantly conducted  complexes Incomplete right bundle branch block ST & T wave abnormality, consider anterior ischemia Abnormal ECG When compared with ECG of 11/29/2017, Premature ventricular complexes are now present Confirmed by Delora Fuel ([id number]) on 12/03/2017 11:29:10 PM  Radiology Dg Chest 2 View  Result Date: 12/03/2017 CLINICAL DATA:  Atrial fibrillation for 1 month. Recent upper respiratory tract infection. EXAM: CHEST  2 VIEW COMPARISON:  Chest radiograph November 24, 2017 FINDINGS: Cardiac silhouette is upper limits of normal, calcified aortic knob. Similar small RIGHT and small to moderate LEFT pleural effusions. Similar chronic interstitial changes and bibasilar strandy densities. RIGHT middle lobe atelectasis. Increased lung volumes with flattened hemidiaphragms. No pneumothorax. Soft tissue planes and included osseous structures are nonsuspicious. Moderate degenerative change of thoracic spine. IMPRESSION: COPD. Similar pleural effusions, bibasilar atelectasis including RIGHT middle lobe atelectasis. Recommend follow-up CT chest on a nonemergent basis to exclude obstructing lesion. Electronically Signed   By: Elon Alas M.D.   On: 12/03/2017 23:14   Ct Angio Chest Pe W/cm &/or Wo Cm  Result Date: 12/04/2017 CLINICAL DATA:  Dyspnea.  Anticoagulated. EXAM: CT ANGIOGRAPHY CHEST WITH CONTRAST TECHNIQUE: Multidetector CT imaging of the chest was performed using the standard protocol during bolus administration of intravenous contrast. Multiplanar CT image reconstructions and MIPs were obtained to evaluate the vascular anatomy. CONTRAST:  125mL ISOVUE-370 IOPAMIDOL (ISOVUE-370) INJECTION 76% COMPARISON:  Chest radiograph from one day prior. FINDINGS: Cardiovascular: The study is moderate quality for the evaluation of pulmonary embolism, with evaluation of the segmental/subsegmental pulmonary arteries limited by motion artifact. There are no filling defects in the central, lobar, segmental or subsegmental  pulmonary artery branches to suggest acute pulmonary embolism. Atherosclerotic nonaneurysmal thoracic aorta. Top-normal caliber main pulmonary artery (3.1 cm diameter). Mild cardiomegaly. No significant pericardial fluid/thickening. Three-vessel coronary atherosclerosis. Mediastinum/Nodes: No discrete thyroid nodules. Unremarkable esophagus. No pathologically enlarged axillary, mediastinal or hilar lymph nodes. Lungs/Pleura: No pneumothorax. Small to moderate right and small left dependent bilateral pleural effusions. Moderate atelectasis in the medial/basilar lower lobes bilaterally. No acute consolidative airspace disease, lung masses or significant pulmonary nodules in the aerated portions of the lungs. Minimal interlobular septal thickening in both lungs. Upper abdomen: There is reflux of contrast into the IVC and hepatic veins. Small hiatal hernia. Musculoskeletal: No aggressive appearing focal osseous lesions. Moderate thoracic spondylosis. Review of the MIP images confirms the above findings. IMPRESSION: 1. No evidence of pulmonary embolism. 2. Spectrum of findings compatible with congestive heart failure, including cardiomegaly, minimal interlobular septal thickening, small to moderate right and small left dependent pleural effusions and contrast reflux into the IVC/patent veins. 3. Moderate bibasilar atelectasis. 4. Three-vessel coronary atherosclerosis. 5. Small hiatal hernia. Aortic Atherosclerosis (ICD10-I70.0). Electronically Signed   By: Ilona Sorrel M.D.   On: 12/04/2017 02:18    Procedures .Critical Care Performed by: Waynetta Pean, PA-C Authorized by: Waynetta Pean, PA-C       CRITICAL CARE Performed by: Hanley Hays   Total critical care time: 45 minutes  Critical care time was exclusive of separately billable procedures and treating other patients.  Critical care was necessary to treat or prevent imminent or life-threatening deterioration.  Critical care was time  spent personally by me on the following activities: development of treatment plan with patient and/or surrogate as well as nursing, discussions with consultants, evaluation of patient's response to treatment, examination of patient, obtaining history from patient or surrogate, ordering and performing treatments and interventions, ordering and review of laboratory studies, ordering and review of radiographic studies, pulse oximetry and re-evaluation of patient's condition.   Medications Ordered in ED Medications  diltiazem (CARDIZEM) 1 mg/mL load via infusion 15 mg (15 mg Intravenous Bolus from Bag 12/04/17 0215)    And  diltiazem (CARDIZEM) 100 mg  in dextrose 5% 119mL (1 mg/mL) infusion (5 mg/hr Intravenous New Bag/Given 12/04/17 0213)  furosemide (LASIX) injection 40 mg (40 mg Intravenous Given 12/04/17 0213)  iopamidol (ISOVUE-370) 76 % injection (100 mLs  Contrast Given 12/04/17 0153)     Initial Impression / Assessment and Plan / ED Course  I have reviewed the triage vital signs and the nursing notes.  Pertinent labs & imaging results that were available during my care of the patient were reviewed by me and considered in my medical decision making (see chart for details).    This is a 72 y.o. Male who presents to the emergency department complaining of worsening shortness of breath over the past several weeks.  Patient reports that he has had worsening trouble breathing over the past 3 weeks.  He has to sit upright and has trouble sleeping at night.  He reports his shortness of breath is much worse with exertion and he can only walk short distances without feeling very winded.  He denies any chest pain.  He reports ongoing problems with atrial fibrillation and there is a plan by cardiology to do a cardioversion at some point.  He is currently on Eliquis for anticoagulation.  He reports he is been compliant with this medication.  He tells me over the past 3 weeks he is also had some problems with  some peripheral edema that first started in his left leg.  He reports they started him on Lasix about 2 weeks ago and he denies history of congestive heart failure.  He reports his primary care doctor and his cardiologist have had to increase his metoprolol to help with rate control and blood pressure.  He reports he is gone from 25 mg of metoprolol twice a day to 100 mg of metoprolol twice a day.  On exam the patient is afebrile nontoxic-appearing.  He is in A. fib on the monitor with a heart rate between 104 124 on my exam.  Some JVD noted.  Crackles noted to bilateral bases.  He has bilateral lower extremity edema. Troponin is not elevated.  BMP shows a mildly elevated creatinine of 1.25, increased from his baseline of around 1.  CBC is remarkable only for leukocytosis with a mild white count of 11,100. Chest x-ray shows COPD, pleural effusions and bibasilar basilar atelectasis including a right middle lobe atelectasis.  Recommends follow-up CT chest on a nonemergent basis to rule out obstructing lesion. We will obtain BNP, and CT angiogram chest with his new worsening shortness of breath and tachycardia.  Will start Cardizem for rate control and blood pressure control. Patient given 40 IV lasix.  CT angiogram of his chest shows no evidence of PE.  There are spectrum of findings compatible with congestive heart failure.  There is also three-vessel coronary atherosclerosis.  BNP is 574. At reevaluation following Cardizem, patient's heart rate is greatly improved into the 80s.  He is urinating.  He will need admission with his new onset CHF for further workup.  Patient agrees with plan for admission. I consulted with hospitalist Dr. Hal Hope who accepted the patient for admission.  This patient was discussed with and evaluated by Dr. Roxanne Mins who agrees with assessment and plan.   Final Clinical Impressions(s) / ED Diagnoses   Final diagnoses:  Atrial fibrillation with RVR ([place])  Acute congestive  heart failure, unspecified heart failure type [facility])  Essential hypertension    ED Discharge Orders    None       Waynetta Pean, PA-C  30/10/40 [id number]    Delora Fuel, MD 36/85/99 [phone]

## 2017-12-04 NOTE — ED Notes (Addendum)
Delay in lab draw,  Admitting MD at bedside. 

## 2017-12-04 NOTE — ED Notes (Signed)
Got patient moved over to the hospital bed hooked up to the cardiac monitor patient is resting with call bell in reach

## 2017-12-04 NOTE — Progress Notes (Signed)
Patient seen and examined at the bedside this morning.  He has past medical history of atrial fibrillation and hypertension.  He was admitted last night with A. fib with RVR and possible congestive heart failure. His heart rate is more controlled this morning during my evaluation.  He was on Cardizem drip. Electrophysiology following.  Plan for TEE/DC CV to restore sinus rhythm. We will follow-up echocardiogram.  Continue diuresis.

## 2017-12-04 NOTE — H&P (Signed)
History and Physical    Sergio Mack:[id number] DOB: 06-Jan-1946 DOA: 12/04/2017  PCP: Binnie Rail, MD  Patient coming from: Home.  Chief Complaint: Shortness of breath.  HPI: Sergio Mack is a 72 y.o. male with known history of atrial fibrillation, hypertension had been experiencing increasing shortness of breath over the last 2-3 weeks.  Was recently treated for sinusitis.  Patient has been noticing increasing exertional shortness of breath with lower extremity edema and orthopnea.  Patient's primary care physician also noticed that patient's heart rate was elevated and was placed on increased dose of metoprolol.  Patient also was started on Lasix which was further continued by cardiology during follow-up last week.  Due to persistent shortness of breath patient comes to the ER today.  ED Course: In the ER patient is found to be in A. fib with RVR was started on Cardizem infusion.  CT angiogram of the chest shows features consistent with CHF.  Patient was given Lasix 40 mg IV following which patient almost had 2 L of output.  Patient is being admitted for acute CHF and A. fib with RVR.  Denies any chest pain fever chills.  Has been having nasal drainage for which patient was placed on prolonged course of doxycycline for sinusitis 1 more dose of which is due in the morning.  Review of Systems: As per HPI, rest all negative.   Past Medical History:  Diagnosis Date  . Anticoagulation adequate, new on eliquis 03/07/2014  . Atrial fibrillation with RVR, new onset, CHA2DS2-VASc score 2- back to SR at discharge 03/06/2014  . Diverticulosis 2006  . Dysrhythmia    Hx A-Fib from 12/2013-new onset- on meds  . Erectile dysfunction   . GERD (gastroesophageal reflux disease)   . History of skin cancer   . History of syncope 1994   no etiology; no recurrence  . Hyperlipidemia    LDL goal = < 100, ideally < 70  . Hypertension   . PVC's (premature ventricular contractions)   . Rotator  cuff tear     Past Surgical History:  Procedure Laterality Date  . COLONOSCOPY  2006   Diverticulosis  . CYSTECTOMY     lip  . CYSTECTOMY     anterior thorax-chest  . INGUINAL HERNIA REPAIR      x 2  . 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 SHOULDER OPEN ROTATOR CUFF REPAIR Right 04/23/2014   Procedure: RIGHT SHOULDER ROTATOR CUFF REPAIR WITH GRAFT AND ANCHORS . OPEN ACROMIONECTOMY;  Surgeon: Tobi Bastos, MD;  Location: [facility];  Service: Orthopedics;  Laterality: Right;  . SPINE SURGERY  2012   Dr Gladstone Lighter  . TONSILLECTOMY AND ADENOIDECTOMY       reports that he quit smoking about 27 years ago. He has quit using smokeless tobacco. He reports that he does not drink alcohol or use drugs.  Allergies  Allergen Reactions  . Glucosamine Itching    Itching feet and palms, tightness in chest  . Shellfish-Derived Products Itching    Itching feet and palms, tightness in chest    Family History  Problem Relation Age of Onset  . Heart attack Father 80  . Diabetes Mother   . Transient ischemic attack Mother 103  . Diabetes Sister   . Heart attack Sister 80  . Stomach cancer Maternal Grandfather   .  Cirrhosis Paternal Uncle        alcoholic  . Colon cancer Neg Hx   . Colon polyps Neg Hx     Prior to Admission medications   Medication Sig Start Date End Date Taking? Authorizing Provider  apixaban (ELIQUIS) 5 MG TABS tablet Take 1 tablet (5 mg total) by mouth 2 (two) times daily. 06/01/17  Yes Sherran Needs, NP  cholecalciferol (VITAMIN D) 1000 units tablet Take 1,000 Units by mouth daily.   Yes [provider]  Cyanocobalamin (VITAMIN B-12) 5000 MCG SUBL Place 5,000 mcg under the tongue daily.   Yes [provider]  doxycycline (VIBRA-TABS) 100 MG tablet Take 1 tablet (100 mg total) by mouth 2 (two) times  daily. 11/24/17  Yes Burns, Claudina Lick, MD  furosemide (LASIX) 20 MG tablet Take 1 tablet (20 mg total) by mouth daily. 11/29/17  Yes Sherran Needs, NP  metoprolol tartrate (LOPRESSOR) 50 MG tablet Take 2 tablets (100 mg total) by mouth 2 (two) times daily. 11/24/17  Yes Burns, Claudina Lick, MD  Multiple Vitamins-Minerals (ICAPS AREDS 2 PO) Take 1 capsule by mouth 2 (two) times daily.   Yes [provider]  quinapril (ACCUPRIL) 40 MG tablet Take 1 tablet (40 mg total) by mouth every morning. 06/01/17  Yes Sherran Needs, NP  Zinc 50 MG CAPS Take 1 capsule by mouth every 3 (three) days.   Yes [provider]    Physical Exam: Vitals:   12/04/17 0200 12/04/17 0215 12/04/17 0230 12/04/17 0300  BP: (!) 148/116 (!) 152/118 (!) 131/107 (!) 123/102  Pulse: (!) 114 (!) 103 88 78  Resp: (!) 28 (!) 26 16 19   Temp:      TempSrc:      SpO2: 99% 99% 97% 96%  Weight:      Height:          Constitutional: Moderately built and nourished. Vitals:   12/04/17 0200 12/04/17 0215 12/04/17 0230 12/04/17 0300  BP: (!) 148/116 (!) 152/118 (!) 131/107 (!) 123/102  Pulse: (!) 114 (!) 103 88 78  Resp: (!) 28 (!) 26 16 19   Temp:      TempSrc:      SpO2: 99% 99% 97% 96%  Weight:      Height:       Eyes: Anicteric no pallor. ENMT: No discharge from the ears eyes nose or mouth. Neck: JVD elevated no mass felt. Respiratory: No rhonchi or crepitations. Cardiovascular: S1-S2 heard no murmurs appreciated with tachycardic. Abdomen: Soft nontender bowel sounds present. Musculoskeletal: Bilateral lower extremity edema no joint effusion. Skin: No rash.  Skin appears warm. Neurologic: Alert awake oriented to time place and person.  Moves all extremities 5 x 5. Psychiatric: Appears normal.  Normal affect.   Labs on Admission: I have personally reviewed following labs and imaging studies  CBC: Recent Labs  Lab 12/03/17 2243  WBC 11.1*  HGB 15.9  HCT 48.4  MCV 87.5  PLT 376   Basic Metabolic  Panel: Recent Labs  Lab 11/29/17 1110 12/03/17 2243  NA 133* 133*  K 4.3 3.9  CL 100* 101  CO2 27 21*  GLUCOSE 103* 116*  BUN 9 12  CREATININE 1.01 1.25*  CALCIUM 9.0 8.7*  MG 1.9  --    GFR: Estimated Creatinine Clearance: 58.6 mL/min (A) (by C-G formula based on SCr of 1.25 mg/dL (H)). Liver Function Tests: No results for input(s): AST, ALT, ALKPHOS, BILITOT, PROT, ALBUMIN in the last 168 hours.  No results for input(s): LIPASE, AMYLASE in the last 168 hours. No results for input(s): AMMONIA in the last 168 hours. Coagulation Profile: No results for input(s): INR, PROTIME in the last 168 hours. Cardiac Enzymes: No results for input(s): CKTOTAL, CKMB, CKMBINDEX, TROPONINI in the last 168 hours. BNP (last 3 results) Recent Labs    11/24/17 0919  PROBNP 532.0*   HbA1C: No results for input(s): HGBA1C in the last 72 hours. CBG: No results for input(s): GLUCAP in the last 168 hours. Lipid Profile: No results for input(s): CHOL, HDL, LDLCALC, TRIG, CHOLHDL, LDLDIRECT in the last 72 hours. Thyroid Function Tests: No results for input(s): TSH, T4TOTAL, FREET4, T3FREE, THYROIDAB in the last 72 hours. Anemia Panel: No results for input(s): VITAMINB12, FOLATE, FERRITIN, TIBC, IRON, RETICCTPCT in the last 72 hours. Urine analysis:    Component Value Date/Time   COLORURINE YELLOW 02/26/2014 [place] 02/26/2014 0917   LABSPEC 1.010 02/26/2014 0917   PHURINE 7.0 02/26/2014 0917   GLUCOSEU NEGATIVE 02/26/2014 0917   HGBUR NEGATIVE 02/26/2014 0917   BILIRUBINUR NEGATIVE 02/26/2014 0917   KETONESUR NEGATIVE 02/26/2014 0917   PROTEINUR NEGATIVE 02/26/2014 0917   UROBILINOGEN 0.2 02/26/2014 0917   NITRITE NEGATIVE 02/26/2014 0917   LEUKOCYTESUR NEGATIVE 02/26/2014 0917   Sepsis Labs: @LABRCNTIP (procalcitonin:4,lacticidven:4) )No results found for this or any previous visit (from the past 240 hour(s)).   Radiological Exams on Admission: Dg Chest 2  View  Result Date: 12/03/2017 CLINICAL DATA:  Atrial fibrillation for 1 month. Recent upper respiratory tract infection. EXAM: CHEST  2 VIEW COMPARISON:  Chest radiograph November 24, 2017 FINDINGS: Cardiac silhouette is upper limits of normal, calcified aortic knob. Similar small RIGHT and small to moderate LEFT pleural effusions. Similar chronic interstitial changes and bibasilar strandy densities. RIGHT middle lobe atelectasis. Increased lung volumes with flattened hemidiaphragms. No pneumothorax. Soft tissue planes and included osseous structures are nonsuspicious. Moderate degenerative change of thoracic spine. IMPRESSION: COPD. Similar pleural effusions, bibasilar atelectasis including RIGHT middle lobe atelectasis. Recommend follow-up CT chest on a nonemergent basis to exclude obstructing lesion. Electronically Signed   By: Elon Alas M.D.   On: 12/03/2017 23:14   Ct Angio Chest Pe W/cm &/or Wo Cm  Result Date: 12/04/2017 CLINICAL DATA:  Dyspnea.  Anticoagulated. EXAM: CT ANGIOGRAPHY CHEST WITH CONTRAST TECHNIQUE: Multidetector CT imaging of the chest was performed using the standard protocol during bolus administration of intravenous contrast. Multiplanar CT image reconstructions and MIPs were obtained to evaluate the vascular anatomy. CONTRAST:  157mL ISOVUE-370 IOPAMIDOL (ISOVUE-370) INJECTION 76% COMPARISON:  Chest radiograph from one day prior. FINDINGS: Cardiovascular: The study is moderate quality for the evaluation of pulmonary embolism, with evaluation of the segmental/subsegmental pulmonary arteries limited by motion artifact. There are no filling defects in the central, lobar, segmental or subsegmental pulmonary artery branches to suggest acute pulmonary embolism. Atherosclerotic nonaneurysmal thoracic aorta. Top-normal caliber main pulmonary artery (3.1 cm diameter). Mild cardiomegaly. No significant pericardial fluid/thickening. Three-vessel coronary atherosclerosis. Mediastinum/Nodes:  No discrete thyroid nodules. Unremarkable esophagus. No pathologically enlarged axillary, mediastinal or hilar lymph nodes. Lungs/Pleura: No pneumothorax. Small to moderate right and small left dependent bilateral pleural effusions. Moderate atelectasis in the medial/basilar lower lobes bilaterally. No acute consolidative airspace disease, lung masses or significant pulmonary nodules in the aerated portions of the lungs. Minimal interlobular septal thickening in both lungs. Upper abdomen: There is reflux of contrast into the IVC and hepatic veins. Small hiatal hernia. Musculoskeletal: No aggressive appearing focal osseous lesions. Moderate thoracic spondylosis.  Review of the MIP images confirms the above findings. IMPRESSION: 1. No evidence of pulmonary embolism. 2. Spectrum of findings compatible with congestive heart failure, including cardiomegaly, minimal interlobular septal thickening, small to moderate right and small left dependent pleural effusions and contrast reflux into the IVC/patent veins. 3. Moderate bibasilar atelectasis. 4. Three-vessel coronary atherosclerosis. 5. Small hiatal hernia. Aortic Atherosclerosis (ICD10-I70.0). Electronically Signed   By: Ilona Sorrel M.D.   On: 12/04/2017 02:18    EKG: Independently reviewed.  A. fib with RVR.  Assessment/Plan Active Problems:   Essential hypertension   Acute CHF (congestive heart failure) (HCC)   Atrial fibrillation with RVR (HCC)   ARF (acute renal failure) ([place])    1. Acute CHF last EF measured was in April 2015 showed EF of 50-55% -patient has been placed on Lasix 40 mg IV every 12.  Patient is already on ACE inhibitor.  Check 2D echo cycle cardiac markers.  Closely follow intake output metabolic panel and daily weights. 2. A. fib with RVR -likely precipitating #1.  Patient is on Cardizem infusion.  Check TSH cardiac markers 2D echo.  Once patient takes oral metoprolol will try to wean off Cardizem infusion.  Chads 2 vasc score is at  least 2 (for hypertension and CHF). 3. Acute renal failure likely from recent use of increased Lasix -closely follow metabolic panel.  If creatinine worsens may have to hold ACE inhibitor. 4. Hypertension uncontrolled -improved after Lasix.  On Cardizem infusion at this time.  Patient also on metoprolol IV centimeter.  5. Recently treated sinusitis on last dose of doxycycline.   DVT prophylaxis: Apixaban. Code Status: Full code. Family Communication: Patient's wife. Disposition Plan: Home. Consults called: Cardiology. Admission status: Inpatient.   Rise Patience MD [facility] Pager [phone].  If 7PM-7AM, please contact night-coverage [email or web address] Password [facility]  12/04/2017, 3:34 AM

## 2017-12-04 NOTE — Progress Notes (Signed)
  Echocardiogram 2D Echocardiogram has been performed.  Merrie Roof F 12/04/2017, 3:23 PM

## 2017-12-05 ENCOUNTER — Inpatient Hospital Stay (HOSPITAL_COMMUNITY): Payer: Medicare Other

## 2017-12-05 ENCOUNTER — Inpatient Hospital Stay (HOSPITAL_COMMUNITY): Payer: Medicare Other | Admitting: Anesthesiology

## 2017-12-05 ENCOUNTER — Encounter (HOSPITAL_COMMUNITY)
Admission: EM | Disposition: A | Discharge status: Home / Self Care | Payer: Self-pay | Source: Home / Self Care | Attending: Internal Medicine

## 2017-12-05 ENCOUNTER — Encounter (HOSPITAL_COMMUNITY): Payer: Self-pay | Admitting: *Deleted

## 2017-12-05 DIAGNOSIS — I5031 Acute diastolic (congestive) heart failure: Secondary | ICD-10-CM

## 2017-12-05 DIAGNOSIS — I340 Nonrheumatic mitral (valve) insufficiency: Secondary | ICD-10-CM

## 2017-12-05 DIAGNOSIS — I4891 Unspecified atrial fibrillation: Secondary | ICD-10-CM

## 2017-12-05 HISTORY — PX: CARDIOVERSION: SHX1299

## 2017-12-05 HISTORY — PX: TEE WITHOUT CARDIOVERSION: SHX5443

## 2017-12-05 LAB — BASIC METABOLIC PANEL
Anion gap: 12 (ref 5–15)
BUN: 14 mg/dL (ref 6–20)
CO2: 28 mmol/L (ref 22–32)
Calcium: 8.8 mg/dL — ABNORMAL LOW (ref 8.9–10.3)
Chloride: 93 mmol/L — ABNORMAL LOW (ref 101–111)
Creatinine, Ser: 1.17 mg/dL (ref 0.61–1.24)
GFR calc Af Amer: >60 mL/min (ref >60)
GFR calc non Af Amer: >60 mL/min (ref >60)
Glucose, Bld: 124 mg/dL — ABNORMAL HIGH (ref 65–99)
Potassium: 3.3 mmol/L — ABNORMAL LOW (ref 3.5–5.1)
Sodium: 133 mmol/L — ABNORMAL LOW (ref 135–145)

## 2017-12-05 SURGERY — ECHOCARDIOGRAM, TRANSESOPHAGEAL
Anesthesia: Monitor Anesthesia Care

## 2017-12-05 MED ORDER — PROPOFOL 500 MG/50ML IV EMUL
INTRAVENOUS | Status: DC | PRN
  Administered 2017-12-05: 100 ug/kg/min via INTRAVENOUS
  Administered 2017-12-05: 125 ug/kg/min via INTRAVENOUS

## 2017-12-05 MED ORDER — FUROSEMIDE 10 MG/ML IJ SOLN
40.0000 mg | Freq: Every day | INTRAMUSCULAR | Status: DC
  Administered 2017-12-05: 40 mg via INTRAVENOUS

## 2017-12-05 MED ORDER — PROPOFOL 10 MG/ML IV BOLUS
INTRAVENOUS | Status: DC | PRN
  Administered 2017-12-05 (×3): 20 mg via INTRAVENOUS

## 2017-12-05 MED ORDER — SODIUM CHLORIDE 0.9 % IV SOLN: INTRAVENOUS | Status: DC

## 2017-12-05 MED ORDER — POTASSIUM CHLORIDE CRYS ER 20 MEQ PO TBCR
40.0000 meq | EXTENDED_RELEASE_TABLET | Freq: Once | ORAL | Status: AC
  Administered 2017-12-05: 40 meq via ORAL
  Filled 2017-12-05: qty 2

## 2017-12-05 MED ORDER — LACTATED RINGERS IV SOLN
INTRAVENOUS | Status: DC
  Administered 2017-12-05: 15:00:00 via INTRAVENOUS

## 2017-12-05 MED ORDER — FUROSEMIDE 20 MG PO TABS: 20.0000 mg | ORAL_TABLET | Freq: Every day | ORAL | Status: DC

## 2017-12-05 NOTE — Anesthesia Postprocedure Evaluation (Signed)
Anesthesia Post Note  Patient: Sergio Mack  Procedure(s) Performed: TRANSESOPHAGEAL ECHOCARDIOGRAM (TEE) (N/A ) CARDIOVERSION (N/A )     Patient location during evaluation: Endoscopy Anesthesia Type: MAC Level of consciousness: awake and alert Pain management: pain level controlled Vital Signs Assessment: post-procedure vital signs reviewed and stable Respiratory status: spontaneous breathing, nonlabored ventilation, respiratory function stable and patient connected to nasal cannula oxygen Cardiovascular status: blood pressure returned to baseline and stable Postop Assessment: no apparent nausea or vomiting Anesthetic complications: no    Last Vitals:  Vitals:   12/05/17 1530 12/05/17 1627  BP: 100/65 126/75  Pulse: 70 76  Resp: 20 19  Temp:    SpO2: 96% 98%    Last Pain:  Vitals:   12/05/17 1520  TempSrc: Oral  PainSc:                  Herald Vallin COKER

## 2017-12-05 NOTE — CV Procedure (Signed)
     Transesophageal Echocardiogram Note  Sergio Mack [id number] 1946/05/29  Procedure: Transesophageal Echocardiogram Indications: atrial fibrillation  Procedure Details Consent: Obtained Time Out: Verified patient identification, verified procedure, site/side was marked, verified correct patient position, special equipment/implants available, Radiology Safety Procedures followed,  medications/allergies/relevent history reviewed, required imaging and test results available.  Performed  Medications: Iv propofol administered by anesthesia staff  No intracardiac source of embolism  Complications: No apparent complications Patient did tolerate procedure well.  Ena Dawley, MD, [facility] 12/05/2017, 3:34 PM     Cardioversion Note  Sergio Mack [id number] 1946-07-04  Procedure: DC Cardioversion Indications: atrial fibrillation  Procedure Details Consent: Obtained Time Out: Verified patient identification, verified procedure, site/side was marked, verified correct patient position, special equipment/implants available, Radiology Safety Procedures followed,  medications/allergies/relevent history reviewed, required imaging and test results available.  Performed  The patient has been on adequate anticoagulation.  The patient received IV Iv propofol administered by anesthesia staff for sedation.  Synchronous cardioversion was performed at 120 joules.  The cardioversion was successful, however frequent PACs, PVCs and short runs of a-fib were present.  Complications: No apparent complications Patient did tolerate procedure well.   Ena Dawley, MD, [facility] 12/05/2017, 3:34 PM

## 2017-12-05 NOTE — Transfer of Care (Signed)
Immediate Anesthesia Transfer of Care Note  Patient: Sergio Mack  Procedure(s) Performed: TRANSESOPHAGEAL ECHOCARDIOGRAM (TEE) (N/A ) CARDIOVERSION (N/A )  Patient Location: Endoscopy Unit  Anesthesia Type:General  Level of Consciousness: awake, oriented and patient cooperative  Airway & Oxygen Therapy: Patient Spontanous Breathing and Patient connected to nasal cannula oxygen  Post-op Assessment: Report given to RN, Post -op Vital signs reviewed and stable and Patient moving all extremities  Post vital signs: Reviewed and stable  Last Vitals:  Vitals:   12/05/17 1117 12/05/17 1324  BP: 111/86 106/80  Pulse: (!) 42   Resp: 19 (!) 21  Temp: 36.9 C 36.8 C  SpO2: (!) 85% 100%    Last Pain:  Vitals:   12/05/17 1324  TempSrc: Oral  PainSc:          Complications: No apparent anesthesia complications

## 2017-12-05 NOTE — Progress Notes (Signed)
PROGRESS NOTE    Sergio Mack  OHY:[id number] DOB: 01/07/1946 DOA: 12/04/2017 PCP: Binnie Rail, MD   Brief Narrative: Patient is a 72 year old male with past medical history of atrial fibrillation, hypertension who presented to the emergency department with increased shortness of breath over the last 2-3 weeks.  Patient found to have to be in A. fib with RVR on presentation.  Started on Cardizem on admission.  Also suspected to be in acute new onset CHF.  Echocardiogram shows mildly reduced left ventricular function.  Cardiology has been consulted and following. He underwent TEE and DCCV today .  Assessment & Plan:   Principal Problem:   Atrial fibrillation with RVR (HCC) Active Problems:   Essential hypertension   Acute CHF (congestive heart failure) (HCC)   ARF (acute renal failure) (HCC)  A. fib with RVR: Started on Cardizem drip on presentation.  Has history of A. fib and is on anticoagulation with Eliquis. Currently heart rate is stable.CHADS2VASC of 2 Underwent TEE with DCCV today.  On metoprolol.  Acute CHF: Much stable now. TTE done here shows mildly reduced left ventricular function with ejection fraction of 45-50%.  It also showed mild to moderate MR. CHF  most likely  was precipitated by A. fib with RVR. Cardiology following.  Patient does not complain of any shortness of breath at present. Patient was started on IV diuretics on admission.We will taper it to his home dose.  Acute renal failure: Resolved.  Hypertension: Currently blood pressure stable.  He is on metoprolol and quinapril at home which we have continued.  Hypokalemia: Supplemented with potassium today.  Will check the levels tomorrow.    DVT prophylaxis: Eliquis Code Status: Full Family Communication: Wife present on the bedside Disposition Plan: Home after cardiology clearance   Consultants: Cardiology  Procedures:TEE,DCCV   Antimicrobials:None  Subjective: Patient seen and examined  the bedside this morning.  Remains comfortable.  No shortness of breath at present.  Overall status has significantly improved since yesterday.  Currently heart rate is controlled  Objective: Vitals:   12/05/17 1117 12/05/17 1324 12/05/17 1520 12/05/17 1530  BP: 111/86 106/80 94/64 100/65  Pulse: (!) 42  66 70  Resp: 19 (!) 21 19 20   Temp: 98.4 F (36.9 C) 98.2 F (36.8 C) 97.9 F (36.6 C)   TempSrc: Oral Oral Oral   SpO2: (!) 85% 100% 98% 96%  Weight:      Height:        Intake/Output Summary (Last 24 hours) at 12/05/2017 1625 Last data filed at 12/05/2017 1513 Gross per 24 hour  Intake 733.92 ml  Output 2200 ml  Net -1466.08 ml   Filed Weights   12/03/17 2244 12/04/17 2204 12/05/17 0445  Weight: 88.5 kg (195 lb) 81.1 kg (178 lb 12.7 oz) 82 kg (180 lb 12.4 oz)    Examination:  General exam: Appears calm and comfortable ,Not in distress,average built Respiratory system: Bilateral equal air entry, normal vesicular breath sounds, no wheezes or crackles  Cardiovascular system: S1 & S2 heard,Afib. No JVD, murmurs, rubs, gallops or clicks. No pedal edema. Gastrointestinal system: Abdomen is nondistended, soft and nontender. No organomegaly or masses felt. Normal bowel sounds heard. Central nervous system: Alert and oriented. No focal neurological deficits. Extremities: Trace edema, no clubbing ,no cyanosis, distal peripheral pulses palpable. Skin: No rashes, lesions or ulcers,no icterus ,no pallor Psychiatry: Judgement and insight appear normal. Mood & affect appropriate.     Data Reviewed: I have personally reviewed following labs  and imaging studies  CBC: Recent Labs  Lab 12/03/17 2243 12/04/17 0441  WBC 11.1* 10.1  HGB 15.9 16.2  HCT 48.4 48.0  MCV 87.5 86.2  PLT 205 536   Basic Metabolic Panel: Recent Labs  Lab 11/29/17 1110 12/03/17 2243 12/04/17 0441 12/05/17 0256  NA 133* 133*  --  133*  K 4.3 3.9  --  3.3*  CL 100* 101  --  93*  CO2 27 21*  --  28    GLUCOSE 103* 116*  --  124*  BUN 9 12  --  14  CREATININE 1.01 1.25*  --  1.17  CALCIUM 9.0 8.7*  --  8.8*  MG 1.9  --  1.9  --    GFR: Estimated Creatinine Clearance: 62.6 mL/min (by C-G formula based on SCr of 1.17 mg/dL). Liver Function Tests: No results for input(s): AST, ALT, ALKPHOS, BILITOT, PROT, ALBUMIN in the last 168 hours. No results for input(s): LIPASE, AMYLASE in the last 168 hours. No results for input(s): AMMONIA in the last 168 hours. Coagulation Profile: No results for input(s): INR, PROTIME in the last 168 hours. Cardiac Enzymes: Recent Labs  Lab 12/04/17 0441 12/04/17 0922 12/04/17 1835  TROPONINI <0.03 <0.03 <0.03   BNP (last 3 results) Recent Labs    11/24/17 0919  PROBNP 532.0*   HbA1C: No results for input(s): HGBA1C in the last 72 hours. CBG: No results for input(s): GLUCAP in the last 168 hours. Lipid Profile: No results for input(s): CHOL, HDL, LDLCALC, TRIG, CHOLHDL, LDLDIRECT in the last 72 hours. Thyroid Function Tests: Recent Labs    12/04/17 0441  TSH 1.660   Anemia Panel: No results for input(s): VITAMINB12, FOLATE, FERRITIN, TIBC, IRON, RETICCTPCT in the last 72 hours. Sepsis Labs: No results for input(s): PROCALCITON, LATICACIDVEN in the last 168 hours.  Recent Results (from the past 240 hour(s))  MRSA PCR Screening     Status: None   Collection Time: 12/04/17  5:40 PM  Result Value Ref Range Status   MRSA by PCR NEGATIVE NEGATIVE Final    Comment:        The GeneXpert MRSA Assay (FDA approved for NASAL specimens only), is one component of a comprehensive MRSA colonization surveillance program. It is not intended to diagnose MRSA infection nor to guide or monitor treatment for MRSA infections.          Radiology Studies: Dg Chest 2 View  Result Date: 12/03/2017 CLINICAL DATA:  Atrial fibrillation for 1 month. Recent upper respiratory tract infection. EXAM: CHEST  2 VIEW COMPARISON:  Chest radiograph November 24, 2017 FINDINGS: Cardiac silhouette is upper limits of normal, calcified aortic knob. Similar small RIGHT and small to moderate LEFT pleural effusions. Similar chronic interstitial changes and bibasilar strandy densities. RIGHT middle lobe atelectasis. Increased lung volumes with flattened hemidiaphragms. No pneumothorax. Soft tissue planes and included osseous structures are nonsuspicious. Moderate degenerative change of thoracic spine. IMPRESSION: COPD. Similar pleural effusions, bibasilar atelectasis including RIGHT middle lobe atelectasis. Recommend follow-up CT chest on a nonemergent basis to exclude obstructing lesion. Electronically Signed   By: Elon Alas M.D.   On: 12/03/2017 23:14   Ct Angio Chest Pe W/cm &/or Wo Cm  Result Date: 12/04/2017 CLINICAL DATA:  Dyspnea.  Anticoagulated. EXAM: CT ANGIOGRAPHY CHEST WITH CONTRAST TECHNIQUE: Multidetector CT imaging of the chest was performed using the standard protocol during bolus administration of intravenous contrast. Multiplanar CT image reconstructions and MIPs were obtained to evaluate the vascular anatomy. CONTRAST:  15mL ISOVUE-370 IOPAMIDOL (ISOVUE-370) INJECTION 76% COMPARISON:  Chest radiograph from one day prior. FINDINGS: Cardiovascular: The study is moderate quality for the evaluation of pulmonary embolism, with evaluation of the segmental/subsegmental pulmonary arteries limited by motion artifact. There are no filling defects in the central, lobar, segmental or subsegmental pulmonary artery branches to suggest acute pulmonary embolism. Atherosclerotic nonaneurysmal thoracic aorta. Top-normal caliber main pulmonary artery (3.1 cm diameter). Mild cardiomegaly. No significant pericardial fluid/thickening. Three-vessel coronary atherosclerosis. Mediastinum/Nodes: No discrete thyroid nodules. Unremarkable esophagus. No pathologically enlarged axillary, mediastinal or hilar lymph nodes. Lungs/Pleura: No pneumothorax. Small to moderate right and  small left dependent bilateral pleural effusions. Moderate atelectasis in the medial/basilar lower lobes bilaterally. No acute consolidative airspace disease, lung masses or significant pulmonary nodules in the aerated portions of the lungs. Minimal interlobular septal thickening in both lungs. Upper abdomen: There is reflux of contrast into the IVC and hepatic veins. Small hiatal hernia. Musculoskeletal: No aggressive appearing focal osseous lesions. Moderate thoracic spondylosis. Review of the MIP images confirms the above findings. IMPRESSION: 1. No evidence of pulmonary embolism. 2. Spectrum of findings compatible with congestive heart failure, including cardiomegaly, minimal interlobular septal thickening, small to moderate right and small left dependent pleural effusions and contrast reflux into the IVC/patent veins. 3. Moderate bibasilar atelectasis. 4. Three-vessel coronary atherosclerosis. 5. Small hiatal hernia. Aortic Atherosclerosis (ICD10-I70.0). Electronically Signed   By: Ilona Sorrel M.D.   On: 12/04/2017 02:18        Scheduled Meds: . apixaban  5 mg Oral BID  . docusate sodium  200 mg Oral QHS  . doxycycline  100 mg Oral Once  . furosemide  40 mg Intravenous Daily  . metoprolol tartrate  100 mg Oral BID  . quinapril  40 mg Oral q morning - 10a  . [START ON 12/06/2017] Vitamin B-12  5,000 mcg Sublingual Daily   Continuous Infusions: . diltiazem (CARDIZEM) infusion 5 mg/hr (12/05/17 0400)     LOS: 1 day    Time spent: 25 mins    Drake Wuertz Jodie Echevaria, MD [facility] Pager [phone]  If 7PM-7AM, please contact night-coverage [email or web address] Password [facility] 12/05/2017, 4:25 PM

## 2017-12-05 NOTE — Progress Notes (Signed)
  Echocardiogram Echocardiogram Transesophageal has been performed.  Sergio Mack 12/05/2017, 3:40 PM

## 2017-12-05 NOTE — Discharge Summary (Signed)
Physician Discharge Summary  Sergio Mack TWS:[id number] DOB: Aug 25, 1946 DOA: 12/04/2017  PCP: Binnie Rail, MD  Admit date: 12/04/2017 Discharge date: 12/05/2017  Admitted From: Home Disposition: Home   Discharge Condition:Stable CODE STATUS:Full Diet recommendation: Heart Healthy  Brief/Interim Summary: Patient is a 72 year old male with past medical history of atrial fibrillation, hypertension who presented to the emergency department with increased shortness of breath over the last 2-3 weeks.  Patient found to have to be in A. fib with RVR on presentation.  Started on Cardizem on admission.  Also suspected to be in acute new onset CHF.  Echocardiogram shows mildly reduced left ventricular function.  Cardiology has been consulted and was following. He underwent TEE and DCCV today . He remained stable and in NSR after the cardioversion today.  He is being discharged to home today. He will follow up with his PCP and Cardiologist in a week.  Problems addressed:  A. fib with RVR: Started on Cardizem drip on presentation.  Has history of A. fib and is on anticoagulation with Eliquis. Currently heart rate is stable.CHADS2VASC of 2 Underwent TEE with DCCV today.  On metoprolol to be continued at home.  Acute CHF: Much stable now. TTE done here shows mildly reduced left ventricular function with ejection fraction of 45-50%.  It also showed mild to moderate MR. CHF  most likely  was precipitated by A. fib with RVR. Cardiology following.  Patient does not complain of any shortness of breath at present. Patient was started on IV diuretics on admission.We will taper it to his home dose.  Acute renal failure: Resolved.  Hypertension: Currently blood pressure stable.  He is on metoprolol and quinapril at home which we have continued.  Hypokalemia: Supplemented with potassium today.       Discharge Diagnoses:  Principal Problem:   Atrial fibrillation with RVR ([facility]) Active  Problems:   Essential hypertension   Acute CHF (congestive heart failure) (HCC)   ARF (acute renal failure) (HCC)    Discharge Instructions  Discharge Instructions    Diet - low sodium heart healthy   Complete by:  As directed    Discharge instructions   Complete by:  As directed    1) Follow up with your PCP in a week. 2) Follow up with your cardiologist in 1-2 weeks.   Increase activity slowly   Complete by:  As directed      Allergies as of 12/05/2017      Reactions   Glucosamine Itching   Itching feet and palms, tightness in chest   Shellfish-derived Products Itching   Itching feet and palms, tightness in chest      Medication List    STOP taking these medications   doxycycline 100 MG tablet Commonly known as:  VIBRA-TABS     TAKE these medications   apixaban 5 MG Tabs tablet Commonly known as:  ELIQUIS Take 1 tablet (5 mg total) by mouth 2 (two) times daily.   cholecalciferol 1000 units tablet Commonly known as:  VITAMIN D Take 1,000 Units by mouth daily.   furosemide 20 MG tablet Commonly known as:  LASIX Take 1 tablet (20 mg total) by mouth daily.   ICAPS AREDS 2 PO Take 1 capsule by mouth 2 (two) times daily.   metoprolol tartrate 50 MG tablet Commonly known as:  LOPRESSOR Take 2 tablets (100 mg total) by mouth 2 (two) times daily.   quinapril 40 MG tablet Commonly known as:  ACCUPRIL Take 1 tablet (40  mg total) by mouth every morning.   Vitamin B-12 5000 MCG Subl Place 5,000 mcg under the tongue daily.   Zinc 50 MG Caps Take 1 capsule by mouth every 3 (three) days.      Follow-up Information    Binnie Rail, MD. Schedule an appointment as soon as possible for a visit in 1 week(s).   Specialty:  Internal Medicine Contact information: [place] [id number] [phone]          Allergies  Allergen Reactions  . Glucosamine Itching    Itching feet and palms, tightness in chest  . Shellfish-Derived Products Itching     Itching feet and palms, tightness in chest    Consultations:  Cardiology   Procedures/Studies: Dg Chest 2 View  Result Date: 12/03/2017 CLINICAL DATA:  Atrial fibrillation for 1 month. Recent upper respiratory tract infection. EXAM: CHEST  2 VIEW COMPARISON:  Chest radiograph November 24, 2017 FINDINGS: Cardiac silhouette is upper limits of normal, calcified aortic knob. Similar small RIGHT and small to moderate LEFT pleural effusions. Similar chronic interstitial changes and bibasilar strandy densities. RIGHT middle lobe atelectasis. Increased lung volumes with flattened hemidiaphragms. No pneumothorax. Soft tissue planes and included osseous structures are nonsuspicious. Moderate degenerative change of thoracic spine. IMPRESSION: COPD. Similar pleural effusions, bibasilar atelectasis including RIGHT middle lobe atelectasis. Recommend follow-up CT chest on a nonemergent basis to exclude obstructing lesion. Electronically Signed   By: Elon Alas M.D.   On: 12/03/2017 23:14   Dg Chest 2 View  Result Date: 11/24/2017 CLINICAL DATA:  Shortness of breath. EXAM: CHEST  2 VIEW COMPARISON:  Radiographs of December 05, 2016. FINDINGS: Stable cardiomediastinal silhouette. Atherosclerosis of thoracic aorta is noted. No pneumothorax is noted. Mild bilateral pleural effusions are noted with associated atelectasis or infiltrates. Bony thorax is unremarkable. IMPRESSION: Aortic atherosclerosis. Mild bilateral pleural effusions with associated subsegmental atelectasis or infiltrates. Electronically Signed   By: Marijo Conception, M.D.   On: 11/24/2017 10:49   Ct Angio Chest Pe W/cm &/or Wo Cm  Result Date: 12/04/2017 CLINICAL DATA:  Dyspnea.  Anticoagulated. EXAM: CT ANGIOGRAPHY CHEST WITH CONTRAST TECHNIQUE: Multidetector CT imaging of the chest was performed using the standard protocol during bolus administration of intravenous contrast. Multiplanar CT image reconstructions and MIPs were obtained to  evaluate the vascular anatomy. CONTRAST:  136mL ISOVUE-370 IOPAMIDOL (ISOVUE-370) INJECTION 76% COMPARISON:  Chest radiograph from one day prior. FINDINGS: Cardiovascular: The study is moderate quality for the evaluation of pulmonary embolism, with evaluation of the segmental/subsegmental pulmonary arteries limited by motion artifact. There are no filling defects in the central, lobar, segmental or subsegmental pulmonary artery branches to suggest acute pulmonary embolism. Atherosclerotic nonaneurysmal thoracic aorta. Top-normal caliber main pulmonary artery (3.1 cm diameter). Mild cardiomegaly. No significant pericardial fluid/thickening. Three-vessel coronary atherosclerosis. Mediastinum/Nodes: No discrete thyroid nodules. Unremarkable esophagus. No pathologically enlarged axillary, mediastinal or hilar lymph nodes. Lungs/Pleura: No pneumothorax. Small to moderate right and small left dependent bilateral pleural effusions. Moderate atelectasis in the medial/basilar lower lobes bilaterally. No acute consolidative airspace disease, lung masses or significant pulmonary nodules in the aerated portions of the lungs. Minimal interlobular septal thickening in both lungs. Upper abdomen: There is reflux of contrast into the IVC and hepatic veins. Small hiatal hernia. Musculoskeletal: No aggressive appearing focal osseous lesions. Moderate thoracic spondylosis. Review of the MIP images confirms the above findings. IMPRESSION: 1. No evidence of pulmonary embolism. 2. Spectrum of findings compatible with congestive heart failure, including cardiomegaly, minimal interlobular  septal thickening, small to moderate right and small left dependent pleural effusions and contrast reflux into the IVC/patent veins. 3. Moderate bibasilar atelectasis. 4. Three-vessel coronary atherosclerosis. 5. Small hiatal hernia. Aortic Atherosclerosis (ICD10-I70.0). Electronically Signed   By: Ilona Sorrel M.D.   On: 12/04/2017 02:18    TEE,Cardioversion   Subjective: Patient seen and examined the bedside this morning.  Remains comfortable.  No shortness of breath at present.  Overall status has significantly improved since yesterday.  Currently heart rate is controlled    Discharge Exam: Vitals:   12/05/17 1530 12/05/17 1627  BP: 100/65 126/75  Pulse: 70 76  Resp: 20 19  Temp:    SpO2: 96% 98%   Vitals:   12/05/17 1324 12/05/17 1520 12/05/17 1530 12/05/17 1627  BP: 106/80 94/64 100/65 126/75  Pulse:  66 70 76  Resp: (!) 21 19 20 19   Temp: 98.2 F (36.8 C) 97.9 F (36.6 C)    TempSrc: Oral Oral    SpO2: 100% 98% 96% 98%  Weight:      Height:        General: Pt is alert, awake, not in acute distress Cardiovascular: RRR, S1/S2 +, no rubs, no gallops Respiratory: CTA bilaterally, no wheezing, no rhonchi Abdominal: Soft, NT, ND, bowel sounds + Extremities: no edema, no cyanosis    The results of significant diagnostics from this hospitalization (including imaging, microbiology, ancillary and laboratory) are listed below for reference.     Microbiology: Recent Results (from the past 240 hour(s))  MRSA PCR Screening     Status: None   Collection Time: 12/04/17  5:40 PM  Result Value Ref Range Status   MRSA by PCR NEGATIVE NEGATIVE Final    Comment:        The GeneXpert MRSA Assay (FDA approved for NASAL specimens only), is one component of a comprehensive MRSA colonization surveillance program. It is not intended to diagnose MRSA infection nor to guide or monitor treatment for MRSA infections.      Labs: BNP (last 3 results) Recent Labs    12/04/17 0127  BNP 160.1*   Basic Metabolic Panel: Recent Labs  Lab 11/29/17 1110 12/03/17 2243 12/04/17 0441 12/05/17 0256  NA 133* 133*  --  133*  K 4.3 3.9  --  3.3*  CL 100* 101  --  93*  CO2 27 21*  --  28  GLUCOSE 103* 116*  --  124*  BUN 9 12  --  14  CREATININE 1.01 1.25*  --  1.17  CALCIUM 9.0 8.7*  --  8.8*  MG 1.9  --  1.9   --    Liver Function Tests: No results for input(s): AST, ALT, ALKPHOS, BILITOT, PROT, ALBUMIN in the last 168 hours. No results for input(s): LIPASE, AMYLASE in the last 168 hours. No results for input(s): AMMONIA in the last 168 hours. CBC: Recent Labs  Lab 12/03/17 2243 12/04/17 0441  WBC 11.1* 10.1  HGB 15.9 16.2  HCT 48.4 48.0  MCV 87.5 86.2  PLT 205 199   Cardiac Enzymes: Recent Labs  Lab 12/04/17 0441 12/04/17 0922 12/04/17 1835  TROPONINI <0.03 <0.03 <0.03   BNP: Invalid input(s): POCBNP CBG: No results for input(s): GLUCAP in the last 168 hours. D-Dimer No results for input(s): DDIMER in the last 72 hours. Hgb A1c No results for input(s): HGBA1C in the last 72 hours. Lipid Profile No results for input(s): CHOL, HDL, LDLCALC, TRIG, CHOLHDL, LDLDIRECT in the last 72 hours. Thyroid function  studies Recent Labs    12/04/17 0441  TSH 1.660   Anemia work up No results for input(s): VITAMINB12, FOLATE, FERRITIN, TIBC, IRON, RETICCTPCT in the last 72 hours. Urinalysis    Component Value Date/Time   COLORURINE COLORLESS (A) 12/04/2017 0335   APPEARANCEUR CLEAR 12/04/2017 0335   LABSPEC 1.008 12/04/2017 0335   PHURINE 7.0 12/04/2017 0335   GLUCOSEU NEGATIVE 12/04/2017 0335   HGBUR NEGATIVE 12/04/2017 0335   BILIRUBINUR NEGATIVE 12/04/2017 0335   KETONESUR NEGATIVE 12/04/2017 0335   PROTEINUR NEGATIVE 12/04/2017 0335   UROBILINOGEN 0.2 02/26/2014 0917   NITRITE NEGATIVE 12/04/2017 0335   LEUKOCYTESUR NEGATIVE 12/04/2017 0335   Sepsis Labs Invalid input(s): PROCALCITONIN,  WBC,  LACTICIDVEN Microbiology Recent Results (from the past 240 hour(s))  MRSA PCR Screening     Status: None   Collection Time: 12/04/17  5:40 PM  Result Value Ref Range Status   MRSA by PCR NEGATIVE NEGATIVE Final    Comment:        The GeneXpert MRSA Assay (FDA approved for NASAL specimens only), is one component of a comprehensive MRSA colonization surveillance program. It  is not intended to diagnose MRSA infection nor to guide or monitor treatment for MRSA infections.      Time coordinating discharge: Over 30 minutes  SIGNED:   Marene Lenz, MD  [facility] 12/05/2017, 6:03 PM Pager [phone]  If 7PM-7AM, please contact night-coverage [email or web address] Password [email or web address]

## 2017-12-05 NOTE — Progress Notes (Signed)
Electrophysiology Rounding Note  Patient Name: Sergio Mack Date of Encounter: 12/05/2017  Electrophysiologist: Granville Whitefield   Subjective   The patient is doing well today.  At this time, the patient denies chest pain, shortness of breath, or any new concerns.  Inpatient Medications    Scheduled Meds: . apixaban  5 mg Oral BID  . docusate sodium  200 mg Oral QHS  . doxycycline  100 mg Oral Once  . furosemide  40 mg Intravenous Daily  . metoprolol tartrate  100 mg Oral BID  . quinapril  40 mg Oral q morning - 10a  . [START ON 12/06/2017] Vitamin B-12  5,000 mcg Sublingual Daily   Continuous Infusions: . diltiazem (CARDIZEM) infusion 5 mg/hr (12/05/17 0400)   PRN Meds: acetaminophen **OR** acetaminophen, ondansetron **OR** ondansetron (ZOFRAN) IV, sodium chloride   Vital Signs    Vitals:   12/04/17 2204 12/04/17 2330 12/05/17 0427 12/05/17 0445  BP:  117/88 (!) 122/98   Pulse:  89 96   Resp:  (!) 21 20   Temp:  97.9 F (36.6 C) 98.2 F (36.8 C)   TempSrc:  Oral Oral   SpO2:  97% 97%   Weight: 178 lb 12.7 oz (81.1 kg)   180 lb 12.4 oz (82 kg)  Height:        Intake/Output Summary (Last 24 hours) at 12/05/2017 0930 Last data filed at 12/05/2017 0429 Gross per 24 hour  Intake 608.92 ml  Output 4100 ml  Net -3491.08 ml   Filed Weights   12/03/17 2244 12/04/17 2204 12/05/17 0445  Weight: 195 lb (88.5 kg) 178 lb 12.7 oz (81.1 kg) 180 lb 12.4 oz (82 kg)    Physical Exam    GEN- The patient is well appearing, alert and oriented x 3 today.   Head- normocephalic, atraumatic Eyes-  Sclera clear, conjunctiva pink Ears- hearing intact Oropharynx- clear Neck- supple Lungs- Clear to ausculation bilaterally, normal work of breathing Heart- Irregular rate and rhythm  GI- soft, NT, ND, + BS Extremities- no clubbing, cyanosis, or edema Skin- no rash or lesion Psych- euthymic mood, full affect Neuro- strength and sensation are intact  Labs    CBC Recent Labs   12/03/17 2243 12/04/17 0441  WBC 11.1* 10.1  HGB 15.9 16.2  HCT 48.4 48.0  MCV 87.5 86.2  PLT 205 517   Basic Metabolic Panel Recent Labs    12/03/17 2243 12/04/17 0441 12/05/17 0256  NA 133*  --  133*  K 3.9  --  3.3*  CL 101  --  93*  CO2 21*  --  28  GLUCOSE 116*  --  124*  BUN 12  --  14  CREATININE 1.25*  --  1.17  CALCIUM 8.7*  --  8.8*  MG  --  1.9  --    Cardiac Enzymes Recent Labs    12/04/17 0441 12/04/17 0922 12/04/17 1835  TROPONINI <0.03 <0.03 <0.03  Thyroid Function Tests Recent Labs    12/04/17 0441  TSH 1.660    Telemetry    AF (personally reviewed)  Radiology    Dg Chest 2 View  Result Date: 12/03/2017 CLINICAL DATA:  Atrial fibrillation for 1 month. Recent upper respiratory tract infection. EXAM: CHEST  2 VIEW COMPARISON:  Chest radiograph November 24, 2017 FINDINGS: Cardiac silhouette is upper limits of normal, calcified aortic knob. Similar small RIGHT and small to moderate LEFT pleural effusions. Similar chronic interstitial changes and bibasilar strandy densities. RIGHT middle lobe atelectasis.  Increased lung volumes with flattened hemidiaphragms. No pneumothorax. Soft tissue planes and included osseous structures are nonsuspicious. Moderate degenerative change of thoracic spine. IMPRESSION: COPD. Similar pleural effusions, bibasilar atelectasis including RIGHT middle lobe atelectasis. Recommend follow-up CT chest on a nonemergent basis to exclude obstructing lesion. Electronically Signed   By: Elon Alas M.D.   On: 12/03/2017 23:14   Ct Angio Chest Pe W/cm &/or Wo Cm  Result Date: 12/04/2017 CLINICAL DATA:  Dyspnea.  Anticoagulated. EXAM: CT ANGIOGRAPHY CHEST WITH CONTRAST TECHNIQUE: Multidetector CT imaging of the chest was performed using the standard protocol during bolus administration of intravenous contrast. Multiplanar CT image reconstructions and MIPs were obtained to evaluate the vascular anatomy. CONTRAST:  166mL ISOVUE-370  IOPAMIDOL (ISOVUE-370) INJECTION 76% COMPARISON:  Chest radiograph from one day prior. FINDINGS: Cardiovascular: The study is moderate quality for the evaluation of pulmonary embolism, with evaluation of the segmental/subsegmental pulmonary arteries limited by motion artifact. There are no filling defects in the central, lobar, segmental or subsegmental pulmonary artery branches to suggest acute pulmonary embolism. Atherosclerotic nonaneurysmal thoracic aorta. Top-normal caliber main pulmonary artery (3.1 cm diameter). Mild cardiomegaly. No significant pericardial fluid/thickening. Three-vessel coronary atherosclerosis. Mediastinum/Nodes: No discrete thyroid nodules. Unremarkable esophagus. No pathologically enlarged axillary, mediastinal or hilar lymph nodes. Lungs/Pleura: No pneumothorax. Small to moderate right and small left dependent bilateral pleural effusions. Moderate atelectasis in the medial/basilar lower lobes bilaterally. No acute consolidative airspace disease, lung masses or significant pulmonary nodules in the aerated portions of the lungs. Minimal interlobular septal thickening in both lungs. Upper abdomen: There is reflux of contrast into the IVC and hepatic veins. Small hiatal hernia. Musculoskeletal: No aggressive appearing focal osseous lesions. Moderate thoracic spondylosis. Review of the MIP images confirms the above findings. IMPRESSION: 1. No evidence of pulmonary embolism. 2. Spectrum of findings compatible with congestive heart failure, including cardiomegaly, minimal interlobular septal thickening, small to moderate right and small left dependent pleural effusions and contrast reflux into the IVC/patent veins. 3. Moderate bibasilar atelectasis. 4. Three-vessel coronary atherosclerosis. 5. Small hiatal hernia. Aortic Atherosclerosis (ICD10-I70.0). Electronically Signed   By: Ilona Sorrel M.D.   On: 12/04/2017 02:18     Patient Profile     Sergio Mack is a 72 y.o. male with a past  medical history significant for persistent AF and HTN.  He was admitted for AF and acute on chronic diastolic heart failure.   Assessment & Plan    1.  Persistent atrial fibrillation Plan for TEE/DCCV today Continue Eliquis long term for CHADS2VASC of 2 With severe LA enlargement seen on echo, he may require AAD therapy to maintain SR I suspect he has been in AF much more than he realizes with echo changes  2.  Acute on chronic diastolic heart failure Likely worsened by AF Will re-evaluate once back in SR  3.  HTN Stable No change required today  4.  Mild to moderate MR Will evaluate further by TEE today  Dr Rayann Heman to see later today    Signed, Chanetta Marshall, NP  12/05/2017, 9:30 AM   I have seen, examined the patient, and reviewed the above assessment and plan.  Changes to above are made where necessary.  On exam, iRRR. Plan is for TEE guided cardioversion today given recently missed dose of eliquis.  The importance of compliance was discussed at length with patient and spouse today.  Hopefully w TEE, we can better assess MR.  Pt prefers to hold off on AAD currently.  We will reassess  depending on how he does after cardioversion.  His symptoms are minimal with afib.  Ultimately, rate control may also be an option.  Co Sign: Thompson Grayer, MD 12/05/2017 12:19 PM

## 2017-12-05 NOTE — Progress Notes (Signed)
Pt discharged per MD order, all instructions reviewed and all questions answered.  

## 2017-12-05 NOTE — Anesthesia Preprocedure Evaluation (Signed)
Anesthesia Evaluation  Patient identified by MRN, date of birth, ID band Patient awake    Reviewed: Allergy & Precautions, NPO status , Patient's Chart, lab work & pertinent test results  Airway Mallampati: II  TM Distance: >3 FB Neck ROM: Full    Dental  (+) Partial Lower, Partial Upper   Pulmonary former smoker,    breath sounds clear to auscultation       Cardiovascular hypertension,  Rhythm:Irregular Rate:Normal     Neuro/Psych    GI/Hepatic   Endo/Other    Renal/GU      Musculoskeletal   Abdominal   Peds  Hematology   Anesthesia Other Findings   Reproductive/Obstetrics                             Anesthesia Physical Anesthesia Plan  ASA: III  Anesthesia Plan: General and MAC   Post-op Pain Management:    Induction: Intravenous  PONV Risk Score and Plan: Ondansetron  Airway Management Planned: Mask  Additional Equipment:   Intra-op Plan:   Post-operative Plan:   Informed Consent: I have reviewed the patients History and Physical, chart, labs and discussed the procedure including the risks, benefits and alternatives for the proposed anesthesia with the patient or authorized representative who has indicated his/her understanding and acceptance.   Dental advisory given  Plan Discussed with: CRNA and Anesthesiologist  Anesthesia Plan Comments:         Anesthesia Quick Evaluation

## 2017-12-06 ENCOUNTER — Encounter (HOSPITAL_COMMUNITY): Payer: Self-pay | Admitting: Cardiology

## 2017-12-06 ENCOUNTER — Telehealth: Payer: Self-pay | Admitting: *Deleted

## 2017-12-06 NOTE — Telephone Encounter (Signed)
Transition Care Management Follow-up Telephone Call   Date discharged? 12/05/17   How have you been since you were released from the hospital? Spoke w/wife she states husband is doing ok   Do you understand why you were in the hospital? YES   Do you understand the discharge instructions? YES   Where were you discharged to? Home   Items Reviewed:  Medications reviewed: YES  Allergies reviewed: YES  Dietary changes reviewed: NO  Referrals reviewed: No referral needed   Functional Questionnaire:   Activities of Daily Living (ADLs):   she states he are independent in the following: ambulation, bathing and hygiene, feeding, continence, grooming, toileting and dressing States he doesn't require assistance    Any transportation issues/concerns?: NO   Any patient concerns? NO   Confirmed importance and date/time of follow-up visits scheduled YES, wife called this am made appt for 12/13/17  Provider Appointment booked with Dr. Quay Burow  Confirmed with patient if condition begins to worsen call PCP or go to the ER.  Patient was given the office number and encouraged to call back with question or concerns.  : YES

## 2017-12-12 NOTE — Progress Notes (Signed)
Subjective:    Patient ID: Sergio Mack, male    DOB: Feb 05, 1946, 72 y.o.   MRN: [id number]  HPI The patient is here for follow up from the hospital.  I saw him 1/4 for SOB and leg swelling.  He has a history of paroxysmal atrial fibrillation and was in A. fib in addition to CHF.  His metoprolol was increased and I started him on Lasix 20 mg daily.  He followed up with cardiology.  When he saw cardiology he denied palpitations, chest pain, orthopnea, edema.  He was still experiencing shortness of breath and fatigue.  He was still in A. fib.  He was continued on his Lasix and metoprolol.  The plan was to cardiovert after 3 weeks of uninterrupted anticoagulation.  Admitted 1/14 - 12/05/17.  He went to the emergency room with increased shortness of breath.  He was in A. fib with RVR.  He was started on Cardizem.  He did have evidence of CHF.  Cardiology was consulted.  Echocardiogram showed mildly reduced ejection fracture.  He underwent a TEE and cardioversion.  Remained in sinus rhythm after cardioversion.  Atrial fibrillation: History of paroxysmal A. fib, which became persistent possibly after decongestants and URI Cardioverted and remained in sinus rhythm Anticoagulated with Eliquis Continued on metoprolol 100 mg twice daily He is taking his medication as prescribed. Heart rate at home 65-88 No concerning symptoms Has cardiology follow-up tomorrow  CHF: New for him, he was not on furosemide prior to this event Related to atrial fibrillation Echocardiogram showed mildly reduced ejection fracture of 45-50%, mild-moderate MR, dilated bilateral atrium He denies shortness of breath after cardioversion He did receive IV diuretics and and was discharged on Lasix 20 mg daily ?  Continue Lasix or not is not on this prior to this atrial fibrillation event that caused his CHF-I will let cardiology determine if he needs this Has cardiology follow-up tomorrow  Acute renal  failure: Resolved  Hypertension: Blood pressure was stable in the hospital Continued on metoprolol and quinapril At home his BP has been variable - 141/89, 133/91, 136/101, 149/104, 134/94, 125/87.  HR 65-88 Ideally blood pressure should be slightly lower.  Since he is seeing cardiology tomorrow I will let them further adjust his medication He will continue his current medication for now We will continue to record his medication Work on becoming more active Work on weight loss Low-sodium diet  Hypokalemia:  He did receive supplementation in the hospital  Medications and allergies reviewed with patient and updated if appropriate.  Patient Active Problem List   Diagnosis Date Noted  . Acute CHF (congestive heart failure) ([place]) 12/04/2017  . Atrial fibrillation with RVR ([place]) 12/04/2017  . ARF (acute renal failure) ([place]) 12/04/2017  . SOB (shortness of breath) 11/24/2017  . Bilateral leg edema 11/24/2017  . Memory difficulties 10/26/2017  . Cramping of feet 04/24/2017  . Headache 04/24/2017  . 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 Sensorineural hearing loss (SNHL), 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  . Prediabetes 04/07/2016  . Full thickness rotator cuff tear 04/23/2014  . Anticoagulation adequate, new on eliquis 03/07/2014  . Paroxysmal atrial fibrillation ([place]) 03/06/2014  . PREMATURE VENTRICULAR CONTRACTIONS, FREQUENT 05/13/2010  . SKIN CANCER, HX OF 03/24/2010  . DIVERTICULOSIS, COLON 12/11/2008  . SPINAL STENOSIS 01/14/2008  . BACK PAIN, LUMBAR, WITH RADICULOPATHY 01/02/2008  . HYPERLIPIDEMIA 10/31/2007  . 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 Essential hypertension 10/31/2007  . 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  GERD 09/27/2007    Current Outpatient Medications on File Prior to Visit  Medication Sig Dispense Refill  . apixaban (ELIQUIS) 5 MG TABS tablet Take 1 tablet (5 mg total) by mouth 2 (two) times daily. 180 tablet 3  .  cholecalciferol (VITAMIN D) 1000 units tablet Take 1,000 Units by mouth daily.    . Cyanocobalamin (VITAMIN B-12) 5000 MCG SUBL Place 5,000 mcg under the tongue daily.    . furosemide (LASIX) 20 MG tablet Take 1 tablet (20 mg total) by mouth daily. 30 tablet 1  . metoprolol tartrate (LOPRESSOR) 50 MG tablet Take 2 tablets (100 mg total) by mouth 2 (two) times daily. 180 tablet 3  . Multiple Vitamins-Minerals (ICAPS AREDS 2 PO) Take 1 capsule by mouth 2 (two) times daily.    . quinapril (ACCUPRIL) 40 MG tablet Take 1 tablet (40 mg total) by mouth every morning. 90 tablet 3  . Zinc 50 MG CAPS Take 1 capsule by mouth every 3 (three) days.     No current facility-administered medications on file prior to visit.     Past Medical History:  Diagnosis Date  . Diverticulosis 2006  . Erectile dysfunction   . GERD (gastroesophageal reflux disease)   . History of skin cancer   . History of syncope 1994   no etiology; no recurrence  . Hyperlipidemia    LDL goal = < 100, ideally < 70  . Hypertension   . Persistent atrial fibrillation ([place]) 03/06/2014  . PVC's (premature ventricular contractions)   . Rotator cuff tear     Past Surgical History:  Procedure Laterality Date  . CARDIOVERSION N/A 12/05/2017   Procedure: CARDIOVERSION;  Surgeon: Dorothy Spark, MD;  Location: [facility] ENDOSCOPY;  Service: Cardiovascular;  Laterality: N/A;  . COLONOSCOPY  2006   Diverticulosis  . CYSTECTOMY     lip  . CYSTECTOMY     anterior thorax-chest  . INGUINAL HERNIA REPAIR      x 2  . 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 SHOULDER OPEN ROTATOR CUFF REPAIR Right 04/23/2014   Procedure: RIGHT SHOULDER ROTATOR CUFF REPAIR WITH GRAFT AND ANCHORS . OPEN ACROMIONECTOMY;  Surgeon: Tobi Bastos, MD;  Location: [facility] ORS;  Service: Orthopedics;  Laterality: Right;  .  SPINE SURGERY  2012   Dr Gladstone Lighter  . TEE WITHOUT CARDIOVERSION N/A 12/05/2017   Procedure: TRANSESOPHAGEAL ECHOCARDIOGRAM (TEE);  Surgeon: Dorothy Spark, MD;  Location: [facility];  Service: Cardiovascular;  Laterality: N/A;  . TONSILLECTOMY AND ADENOIDECTOMY      Social History   Socioeconomic History  . Marital status: Married    Spouse name: None  . Number of children: None  . Years of education: None  . Highest education level: None  Social Needs  . Financial resource strain: None  . Food insecurity - worry: None  . Food insecurity - inability: None  . Transportation needs - medical: None  . Transportation needs - non-medical: None  Occupational History  . None  Tobacco Use  . Smoking status: Former Smoker    Last attempt to quit: 04/26/1990    Years since quitting: 27.6  . Smokeless tobacco: Former Systems developer  . Tobacco comment: smoked [place] with 12 year abstinence ( 21 total years of smoking), up to 1 ppd  Substance and Sexual Activity  . Alcohol use: No  Alcohol/week: 0.0 oz  . Drug use: No  . Sexual activity: None  Other Topics Concern  . None  Social History Narrative   No regular exercise    Family History  Problem Relation Age of Onset  . Heart attack Father 22  . Diabetes Mother   . Transient ischemic attack Mother 70  . Diabetes Sister   . Heart attack Sister 39  . Stomach cancer Maternal Grandfather   . Cirrhosis Paternal Uncle        alcoholic  . Colon cancer Neg Hx   . Colon polyps Neg Hx     Review of Systems  Constitutional: Negative for chills and fever.  HENT: Positive for congestion (mild) and postnasal drip.   Respiratory: Positive for cough (related to PND) and shortness of breath (with exertion at times - at baseline). Negative for wheezing.   Cardiovascular: Negative for chest pain, palpitations and leg swelling.  Neurological: Negative for light-headedness and headaches.       Objective:   Vitals:   12/13/17 1103  BP:  134/84  Pulse: 65  Resp: 16  Temp: 98.2 F (36.8 C)  SpO2: 98%   Wt Readings from Last 3 Encounters:  12/13/17 189 lb (85.7 kg)  12/05/17 180 lb 12.4 oz (82 kg)  11/29/17 204 lb (92.5 kg)   Body mass index is 25.63 kg/m.   Physical Exam    Constitutional: Appears well-developed and well-nourished. No distress.  HENT:  Head: Normocephalic and atraumatic.  Neck: Neck supple. No tracheal deviation present. No thyromegaly present.  No cervical lymphadenopathy Cardiovascular: Normal rate, regular rhythm primarily with a few beats here and they are with immature beats or runs of A. fib for several beats and normal heart sounds.  .  No edema Pulmonary/Chest: Effort normal and breath sounds normal. No respiratory distress. No has no wheezes. No rales.  Skin: Skin is warm and dry. Not diaphoretic.  Psychiatric: Normal mood and affect. Behavior is normal.   ECHO TEE                            *[facility]*                         [address], [place] [address]                            [phone]  ------------------------------------------------------------------- Transesophageal Echocardiography  Patient:    Yidel, Teuscher MR #:       [id number] Study Date: 12/05/2017 Gender:     M Age:        1 Height:     182.9 cm Weight:     81.8 kg BSA:        2.04 m^2 Pt. Status: Room:       [facility]    Delora Fuel [id number]  ADMITTING    Gean Birchwood N  SONOGRAPHER  Johny Chess, RDCS, CCT  PERFORMING   Ena Dawley, M.D.  Emelia Loron, Amber  cc:  ------------------------------------------------------------------- LV EF: 45% -   50%  -------------------------------------------------------------------  Indications:      Atrial fibrillation - 427.31.  ------------------------------------------------------------------- Study Conclusions  - Left ventricle: Systolic  function was mildly reduced. The   estimated ejection fraction was in the range of 45% to 50%.   Diffuse hypokinesis. - Mitral valve: There was mild regurgitation. - Left atrium: The atrium was dilated. No evidence of thrombus in   the atrial cavity or appendage. No evidence of thrombus in the   atrial cavity or appendage. No evidence of thrombus in the   appendage. - Right atrium: The atrium was dilated. No evidence of thrombus in   the atrial cavity or appendage. - Atrial septum: There was a patent foramen ovale. - Tricuspid valve: There was mild regurgitation. - Pulmonic valve: There was trivial regurgitation.  Impressions:  - No cardiac source of emboli was indentified. Positive bubble   study. TEE was followed by a successful cardioversion.  ------------------------------------------------------------------- Study data:   Study status:  Routine.  Consent:  The risks, benefits, and alternatives to the procedure were explained to the patient and informed consent was obtained.  Procedure:  Initial setup. The patient was brought to the laboratory. Surface ECG leads were monitored. Sedation. Sedation was administered by anesthesiology staff. Transesophageal echocardiography. An adult multiplane transesophageal probe was inserted by the attending cardiologistwithout difficulty. Image quality was adequate.  Study completion:  The patient tolerated the procedure well. There were no complications.          Diagnostic transesophageal echocardiography.  2D and color Doppler.  Birthdate:  Patient birthdate: 06-Jan-1946.  Age:  Patient is 72 yr old.  Sex:  Gender: male.    BMI: 24.5 kg/m^2.  Blood pressure:     92/60  Patient status:  Inpatient.  Study date:  Study date: 12/05/2017. Study time: 02:55 PM.  Location:  Endoscopy.  -------------------------------------------------------------------  ------------------------------------------------------------------- Left ventricle:   Systolic function was mildly reduced. The estimated ejection fraction was in the range of 45% to 50%. Diffuse hypokinesis.  ------------------------------------------------------------------- Aortic valve:   Trileaflet; mildly thickened, mildly calcified leaflets. Cusp separation was normal.  Doppler:  There was no significant regurgitation.  ------------------------------------------------------------------- Aorta:  There was mild atheroma. There was no evidence for dissection. Aortic root: The aortic root was not dilated. Ascending aorta: The ascending aorta was normal in size. Descending aorta: The descending aorta was normal in size.  ------------------------------------------------------------------- Mitral valve:   Structurally normal valve.   Leaflet separation was normal.  Doppler:  There was mild regurgitation.  ------------------------------------------------------------------- Left atrium:  The atrium was dilated.  No evidence of thrombus in the atrial cavity or appendage.  No evidence of thrombus in the atrial cavity or appendage.  No evidence of thrombus in the appendage. The appendage was morphologically a left appendage, multilobulated, and of normal size. Emptying velocity was normal.   ------------------------------------------------------------------- Atrial septum:  There was a patent foramen ovale.  ------------------------------------------------------------------- Right ventricle:  The cavity size was normal. Wall thickness was normal. Systolic function was normal.  ------------------------------------------------------------------- Pulmonic valve:    Structurally normal valve.    Doppler:  There was trivial regurgitation.  ------------------------------------------------------------------- Tricuspid valve:   Structurally normal valve.   Leaflet separation was normal.  Doppler:  There was mild  regurgitation.  ------------------------------------------------------------------- Pulmonary artery:   The main pulmonary artery was normal-sized.  ------------------------------------------------------------------- Right atrium:  The atrium was dilated.  No evidence of thrombus in the atrial cavity or appendage. The appendage was morphologically a right appendage.  ------------------------------------------------------------------- Pericardium:  There was no pericardial effusion.   -------------------------------------------------------------------  Prepared and Electronically Authenticated by  Ena Dawley, M.D. 2019-01-15T15:59:04    Assessment & Plan:    See Problem List for Assessment and Plan of chronic medical problems.

## 2017-12-13 ENCOUNTER — Encounter: Payer: Self-pay | Admitting: Internal Medicine

## 2017-12-13 ENCOUNTER — Ambulatory Visit (INDEPENDENT_AMBULATORY_CARE_PROVIDER_SITE_OTHER): Payer: Medicare Other | Admitting: Internal Medicine

## 2017-12-13 VITALS — BP 134/84 | HR 65 | Temp 98.2°F | Resp 16 | Wt 189.0 lb

## 2017-12-13 DIAGNOSIS — I4891 Unspecified atrial fibrillation: Secondary | ICD-10-CM | POA: Diagnosis not present

## 2017-12-13 DIAGNOSIS — N179 Acute kidney failure, unspecified: Secondary | ICD-10-CM

## 2017-12-13 DIAGNOSIS — I509 Heart failure, unspecified: Secondary | ICD-10-CM

## 2017-12-13 DIAGNOSIS — I10 Essential (primary) hypertension: Secondary | ICD-10-CM

## 2017-12-13 NOTE — Assessment & Plan Note (Signed)
Acute CHF resolved-this was related to atrial fibrillation Echocardiogram showed EF 45-50%, which is lower than previous Currently no shortness of breath and lungs are clear.  No leg edema on exam Currently euvolemic

## 2017-12-13 NOTE — Assessment & Plan Note (Signed)
S/p cardioversion and now appears to be primarily in sinus.  On exam he does have occasional premature beats or runs of several short runs of possible A. fib Sees cardiology tomorrow Continue metoprolol at current dose and Eliquis Heart rate well controlled with current dose of metoprolol

## 2017-12-13 NOTE — Patient Instructions (Addendum)
  Medications reviewed and updated.  No changes recommended at this time.   Please followup in June as scheduled.    

## 2017-12-13 NOTE — Assessment & Plan Note (Signed)
Slightly higher at home than ideal Likely needs medication adjustment, but since he is seeing cardiology tomorrow I will let them either adjust his metoprolol or add a third medication ?  Continue Lasix or not since he did not need this prior to this atrial fibrillation episode

## 2017-12-14 ENCOUNTER — Ambulatory Visit (HOSPITAL_COMMUNITY)
Admission: RE | Admit: 2017-12-14 | Discharge: 2017-12-14 | Disposition: A | Discharge status: Home / Self Care | Payer: Medicare Other | Source: Ambulatory Visit | Attending: Nurse Practitioner | Admitting: Nurse Practitioner

## 2017-12-14 ENCOUNTER — Telehealth: Payer: Self-pay | Admitting: Pharmacist

## 2017-12-14 ENCOUNTER — Encounter (HOSPITAL_COMMUNITY): Payer: Self-pay | Admitting: Nurse Practitioner

## 2017-12-14 ENCOUNTER — Ambulatory Visit (HOSPITAL_COMMUNITY): Payer: Medicare Other | Admitting: Nurse Practitioner

## 2017-12-14 VITALS — BP 154/84 | HR 99 | Ht 72.0 in | Wt 187.0 lb

## 2017-12-14 DIAGNOSIS — Z91013 Allergy to seafood: Secondary | ICD-10-CM | POA: Insufficient documentation

## 2017-12-14 DIAGNOSIS — Z8249 Family history of ischemic heart disease and other diseases of the circulatory system: Secondary | ICD-10-CM | POA: Diagnosis not present

## 2017-12-14 DIAGNOSIS — I481 Persistent atrial fibrillation: Secondary | ICD-10-CM | POA: Diagnosis not present

## 2017-12-14 DIAGNOSIS — Z85828 Personal history of other malignant neoplasm of skin: Secondary | ICD-10-CM | POA: Diagnosis not present

## 2017-12-14 DIAGNOSIS — Z79899 Other long term (current) drug therapy: Secondary | ICD-10-CM | POA: Diagnosis not present

## 2017-12-14 DIAGNOSIS — Z800 Family history of malignant neoplasm of digestive organs: Secondary | ICD-10-CM | POA: Insufficient documentation

## 2017-12-14 DIAGNOSIS — E785 Hyperlipidemia, unspecified: Secondary | ICD-10-CM | POA: Diagnosis not present

## 2017-12-14 DIAGNOSIS — Z87891 Personal history of nicotine dependence: Secondary | ICD-10-CM | POA: Diagnosis not present

## 2017-12-14 DIAGNOSIS — I10 Essential (primary) hypertension: Secondary | ICD-10-CM | POA: Insufficient documentation

## 2017-12-14 DIAGNOSIS — K219 Gastro-esophageal reflux disease without esophagitis: Secondary | ICD-10-CM | POA: Insufficient documentation

## 2017-12-14 DIAGNOSIS — Z9889 Other specified postprocedural states: Secondary | ICD-10-CM | POA: Insufficient documentation

## 2017-12-14 DIAGNOSIS — I4819 Other persistent atrial fibrillation: Secondary | ICD-10-CM

## 2017-12-14 DIAGNOSIS — N529 Male erectile dysfunction, unspecified: Secondary | ICD-10-CM | POA: Diagnosis not present

## 2017-12-14 DIAGNOSIS — Z888 Allergy status to other drugs, medicaments and biological substances status: Secondary | ICD-10-CM | POA: Diagnosis not present

## 2017-12-14 DIAGNOSIS — Z833 Family history of diabetes mellitus: Secondary | ICD-10-CM | POA: Diagnosis not present

## 2017-12-14 DIAGNOSIS — Z7902 Long term (current) use of antithrombotics/antiplatelets: Secondary | ICD-10-CM | POA: Diagnosis not present

## 2017-12-14 LAB — BASIC METABOLIC PANEL
Anion gap: 9 (ref 5–15)
BUN: 9 mg/dL (ref 6–20)
CALCIUM: 8.9 mg/dL (ref 8.9–10.3)
CO2: 25 mmol/L (ref 22–32)
CREATININE: 1.02 mg/dL (ref 0.61–1.24)
Chloride: 99 mmol/L — ABNORMAL LOW (ref 101–111)
GFR calc Af Amer: >60 mL/min (ref >60)
Glucose, Bld: 106 mg/dL — ABNORMAL HIGH (ref 65–99)
Potassium: 4.2 mmol/L (ref 3.5–5.1)
SODIUM: 133 mmol/L — AB (ref 135–145)

## 2017-12-14 LAB — MAGNESIUM: MAGNESIUM: 2 mg/dL (ref 1.7–2.4)

## 2017-12-14 NOTE — Telephone Encounter (Signed)
Medication list reviewed in anticipation of upcoming Tikosyn initiation. Patient is not taking any contraindicated or QTc prolonging medications. K and Mg both at goal.  Patient is anticoagulated on Eliquis 5mg  BID on the appropriate dose. Please ensure that patient has not missed any anticoagulation doses in the 3 weeks prior to Tikosyn initiation.   Patient will need to be counseled to avoid use of Benadryl while on Tikosyn and in the 2-3 days prior to Tikosyn initiation.

## 2017-12-14 NOTE — Progress Notes (Signed)
Patient ID: Sergio Mack, male   DOB: 1946/01/27, 72 y.o.   MRN: [id number]      PCP:  Binnie Rail, MD  The patient presents today for electrophysiology followup.  He was seen in May 2015 after a brief episode  of afib around time of shoulder surgery, from which he spontaneously converted to NSR.  Was started on  eliquis and metoprolol without further problems. He had a recent echo and stress test at time of surgery with normal EF and low risk stress test.  Seen in afib clinic today for persistent afib that started around the time of a sinus infection a couple weeks ago, which has now progressed to symptoms in his chest. He was seen by PCP and started on  antibiotics for 10 more days and  has  also been started on lasix 20 mg daily for swelling of LE's during all of this. He has felt very fatigued and short of breath as well. He knows he has not missed any eliquis last week but is not sure before that. Metoprolol has also been increased to rate control pt. He is starting to feel better.CXR/labs were done at PCP office. Did also take some decongestants around the beginning of illness. Discussed prcedign to cardioversion after 3 weeks of uninterrupted anticoagulation.  Unfortunately, pt developed shortness of breath and presented to the ER with RVR and HF. TEE showed reduced EF at 45-50% with mild to mod MR, with enlarged left atrium. He was successfully diuresed, cardioverted and sent home.  In the afib clinic, f/u 1/24. Unfortunately, he has returned to afib, possible as of  yesterday. Weight is stable, he is currently not experiencing shortness of breath.  Today, he denies symptoms of palpitations, chest pain, , orthopnea, PND, lower extremity edema, dizziness, presyncope, syncope, or neurologic sequela.  Positive for shortness of breath and fatigue The patient feels that he is tolerating medications without difficulties and is otherwise without complaint today.     Past Medical History:    Diagnosis Date  . Diverticulosis 2006  . Erectile dysfunction   . GERD (gastroesophageal reflux disease)   . History of skin cancer   . History of syncope 1994   no etiology; no recurrence  . Hyperlipidemia    LDL goal = < 100, ideally < 70  . Hypertension   . Persistent atrial fibrillation ([place]) 03/06/2014  . PVC's (premature ventricular contractions)   . Rotator cuff tear    Past Surgical History:  Procedure Laterality Date  . CARDIOVERSION N/A 12/05/2017   Procedure: CARDIOVERSION;  Surgeon: Dorothy Spark, MD;  Location: [facility] ENDOSCOPY;  Service: Cardiovascular;  Laterality: N/A;  . COLONOSCOPY  2006   Diverticulosis  . CYSTECTOMY     lip  . CYSTECTOMY     anterior thorax-chest  . INGUINAL HERNIA REPAIR      x 2  . 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 SHOULDER OPEN ROTATOR CUFF REPAIR Right 04/23/2014   Procedure: RIGHT SHOULDER ROTATOR CUFF REPAIR WITH GRAFT AND ANCHORS . OPEN ACROMIONECTOMY;  Surgeon: Tobi Bastos, MD;  Location: [facility] ORS;  Service: Orthopedics;  Laterality: Right;  . SPINE SURGERY  2012   Dr Gladstone Lighter  . TEE WITHOUT CARDIOVERSION N/A 12/05/2017   Procedure: TRANSESOPHAGEAL ECHOCARDIOGRAM (TEE);  Surgeon: Dorothy Spark, MD;  Location: [place];  Service: Cardiovascular;  Laterality: N/A;  . TONSILLECTOMY AND ADENOIDECTOMY      Current Outpatient Medications  Medication Sig Dispense Refill  . apixaban (ELIQUIS) 5 MG TABS tablet Take 1 tablet (5 mg total) by mouth 2 (two) times daily. 180 tablet 3  . cholecalciferol (VITAMIN D) 1000 units tablet Take 1,000 Units by mouth daily.    . Cyanocobalamin (VITAMIN B-12) 5000 MCG SUBL Place 5,000 mcg under the tongue daily.    . furosemide (LASIX) 20 MG tablet Take 1 tablet (20 mg total) by mouth daily. 30 tablet 1  . metoprolol tartrate (LOPRESSOR) 50  MG tablet Take 2 tablets (100 mg total) by mouth 2 (two) times daily. 180 tablet 3  . Multiple Vitamins-Minerals (ICAPS AREDS 2 PO) Take 1 capsule by mouth 2 (two) times daily.    . quinapril (ACCUPRIL) 40 MG tablet Take 1 tablet (40 mg total) by mouth every morning. 90 tablet 3  . Zinc 50 MG CAPS Take 1 capsule by mouth every 3 (three) days.     No current facility-administered medications for this encounter.     Allergies  Allergen Reactions  . Glucosamine Itching    Itching feet and palms, tightness in chest  . Shellfish-Derived Products Itching    Itching feet and palms, tightness in chest    Social History   Socioeconomic History  . Marital status: Married    Spouse name: Not on file  . Number of children: Not on file  . Years of education: Not on file  . Highest education level: Not on file  Social Needs  . Financial resource strain: Not on file  . Food insecurity - worry: Not on file  . Food insecurity - inability: Not on file  . Transportation needs - medical: Not on file  . Transportation needs - non-medical: Not on file  Occupational History  . Not on file  Tobacco Use  . Smoking status: Former Smoker    Last attempt to quit: 04/26/1990    Years since quitting: 27.6  . Smokeless tobacco: Former Systems developer  . Tobacco comment: smoked [place] with 12 year abstinence ( 21 total years of smoking), up to 1 ppd  Substance and Sexual Activity  . Alcohol use: No    Alcohol/week: 0.0 oz  . Drug use: No  . Sexual activity: Not on file  Other Topics Concern  . Not on file  Social History Narrative   No regular exercise    Family History  Problem Relation Age of Onset  . Heart attack Father 81  . Diabetes Mother   . Transient ischemic attack Mother 24  . Diabetes Sister   . Heart attack Sister 28  . Stomach cancer Maternal Grandfather   . Cirrhosis Paternal Uncle        alcoholic  . Colon cancer Neg Hx   . Colon polyps Neg Hx     ROS-  All systems are reviewed  and are negative except as outlined in the HPI above  Physical Exam: Vitals:   12/14/17 1328  BP: (!) 154/84  Pulse: 99  Weight: 187 lb (84.8 kg)  Height: 6' (1.829 m)    GEN- The patient is well appearing, alert and oriented x 3 today.   Head- normocephalic, atraumatic Eyes-  Sclera clear, conjunctiva pink Ears- hearing intact Oropharynx- clear Neck- supple, no JVP Lymph- no cervical lymphadenopathy Lungs- Clear to ausculation bilaterally, normal work of breathing Heart-  Irregular  rate and rhythm, no murmurs, rubs or gallops, PMI  not laterally displaced GI- soft, NT, ND, + BS Extremities- no clubbing, cyanosis, or trace edema shin area to 1+ top of feet MS- no significant deformity or atrophy Skin- no rash or lesion Psych- euthymic mood, full affect Neuro- strength and sensation are intact  Ekg-afib at 99 bpm, RBBB, qrs int 108 bpm, qtc 467 ms Epic records reviewed   Assessment and Plan:  1. Persistent atrial fibrillation  Associated HF with recent prolonged episode of  afib with RVR  Successful TEE guided cardioversion but unfortunately with ERAF Currently rate controlled Continue metoprolol 100 mg bid  Discussed options of antiarrythmic's to restore SR, flecainide/multaq is not an option 2/2 to reduced EF Discussed Tikosyn vrs sotalol Wife will check on price of drug, and if ok with price, will plan on hospitalization this next Tuesday to proceed with drug Risk vrs benefit of Tikosyn discussed with pt  No benadryl use  Avoid decongestants  Continue lasix 20 mg daily Bmet/mag today  2. Chadsvasc score of at least 2. Continue eliquis, states no missed doses  3. HTN Elevated today, pt states it was better in SR Continue to monitor    Butch Penny C. Elvyn Krohn, [facility] [address] [place], [place] [address] [phone]

## 2017-12-19 ENCOUNTER — Inpatient Hospital Stay (HOSPITAL_COMMUNITY)
Admission: AD | Admit: 2017-12-19 | Discharge: 2017-12-22 | DRG: 310 | Disposition: A | Discharge status: Home / Self Care | Payer: Medicare Other | Source: Ambulatory Visit | Attending: Internal Medicine | Admitting: Internal Medicine

## 2017-12-19 ENCOUNTER — Encounter (HOSPITAL_COMMUNITY): Payer: Self-pay | Admitting: Nurse Practitioner

## 2017-12-19 ENCOUNTER — Other Ambulatory Visit: Payer: Self-pay

## 2017-12-19 ENCOUNTER — Encounter (HOSPITAL_COMMUNITY): Payer: Self-pay | Admitting: General Practice

## 2017-12-19 ENCOUNTER — Ambulatory Visit (HOSPITAL_COMMUNITY)
Admission: RE | Admit: 2017-12-19 | Discharge: 2017-12-19 | Disposition: A | Discharge status: Home / Self Care | Payer: Medicare Other | Source: Ambulatory Visit | Attending: Nurse Practitioner | Admitting: Nurse Practitioner

## 2017-12-19 VITALS — BP 132/84 | HR 103 | Ht 72.0 in | Wt 184.0 lb

## 2017-12-19 DIAGNOSIS — I481 Persistent atrial fibrillation: Secondary | ICD-10-CM | POA: Diagnosis not present

## 2017-12-19 DIAGNOSIS — Z8249 Family history of ischemic heart disease and other diseases of the circulatory system: Secondary | ICD-10-CM

## 2017-12-19 DIAGNOSIS — Z79899 Other long term (current) drug therapy: Secondary | ICD-10-CM

## 2017-12-19 DIAGNOSIS — Z800 Family history of malignant neoplasm of digestive organs: Secondary | ICD-10-CM | POA: Diagnosis not present

## 2017-12-19 DIAGNOSIS — E785 Hyperlipidemia, unspecified: Secondary | ICD-10-CM | POA: Diagnosis present

## 2017-12-19 DIAGNOSIS — I509 Heart failure, unspecified: Secondary | ICD-10-CM | POA: Diagnosis present

## 2017-12-19 DIAGNOSIS — I10 Essential (primary) hypertension: Secondary | ICD-10-CM

## 2017-12-19 DIAGNOSIS — I110 Hypertensive heart disease with heart failure: Secondary | ICD-10-CM | POA: Diagnosis present

## 2017-12-19 DIAGNOSIS — I4819 Other persistent atrial fibrillation: Secondary | ICD-10-CM | POA: Diagnosis present

## 2017-12-19 DIAGNOSIS — I4510 Unspecified right bundle-branch block: Secondary | ICD-10-CM | POA: Diagnosis present

## 2017-12-19 DIAGNOSIS — Z833 Family history of diabetes mellitus: Secondary | ICD-10-CM

## 2017-12-19 DIAGNOSIS — Z7901 Long term (current) use of anticoagulants: Secondary | ICD-10-CM

## 2017-12-19 DIAGNOSIS — Z85828 Personal history of other malignant neoplasm of skin: Secondary | ICD-10-CM

## 2017-12-19 DIAGNOSIS — Z5181 Encounter for therapeutic drug level monitoring: Secondary | ICD-10-CM

## 2017-12-19 HISTORY — DX: Other long term (current) drug therapy: Z79.899

## 2017-12-19 HISTORY — DX: Encounter for therapeutic drug level monitoring: Z51.81

## 2017-12-19 LAB — BASIC METABOLIC PANEL
Anion gap: 9 (ref 5–15)
BUN: 9 mg/dL (ref 6–20)
CHLORIDE: 98 mmol/L — AB (ref 101–111)
CO2: 26 mmol/L (ref 22–32)
Calcium: 9.1 mg/dL (ref 8.9–10.3)
Creatinine, Ser: 0.97 mg/dL (ref 0.61–1.24)
GFR calc Af Amer: >60 mL/min (ref >60)
GLUCOSE: 94 mg/dL (ref 65–99)
POTASSIUM: 4.3 mmol/L (ref 3.5–5.1)
Sodium: 133 mmol/L — ABNORMAL LOW (ref 135–145)

## 2017-12-19 LAB — MAGNESIUM: Magnesium: 2 mg/dL (ref 1.7–2.4)

## 2017-12-19 MED ORDER — SODIUM CHLORIDE 0.9 % IV SOLN: 250.0000 mL | INTRAVENOUS | Status: DC | PRN

## 2017-12-19 MED ORDER — DOFETILIDE 500 MCG PO CAPS
500.0000 ug | ORAL_CAPSULE | Freq: Two times a day (BID) | ORAL | Status: DC
  Administered 2017-12-19 – 2017-12-22 (×7): 500 ug via ORAL
  Filled 2017-12-19 (×7): qty 1

## 2017-12-19 MED ORDER — FUROSEMIDE 20 MG PO TABS
20.0000 mg | ORAL_TABLET | Freq: Every day | ORAL | Status: DC
  Administered 2017-12-20 – 2017-12-22 (×3): 20 mg via ORAL
  Filled 2017-12-19 (×4): qty 1

## 2017-12-19 MED ORDER — APIXABAN 5 MG PO TABS
5.0000 mg | ORAL_TABLET | Freq: Two times a day (BID) | ORAL | Status: DC
  Administered 2017-12-19 – 2017-12-22 (×6): 5 mg via ORAL
  Filled 2017-12-19 (×6): qty 1

## 2017-12-19 MED ORDER — METOPROLOL TARTRATE 100 MG PO TABS
100.0000 mg | ORAL_TABLET | Freq: Two times a day (BID) | ORAL | Status: DC
  Administered 2017-12-19 – 2017-12-21 (×4): 100 mg via ORAL
  Filled 2017-12-19 (×5): qty 1

## 2017-12-19 MED ORDER — SODIUM CHLORIDE 0.9% FLUSH: 3.0000 mL | INTRAVENOUS | Status: DC | PRN

## 2017-12-19 MED ORDER — SODIUM CHLORIDE 0.9% FLUSH
3.0000 mL | Freq: Two times a day (BID) | INTRAVENOUS | Status: DC
  Administered 2017-12-19 – 2017-12-22 (×7): 3 mL via INTRAVENOUS

## 2017-12-19 MED ORDER — LISINOPRIL 40 MG PO TABS
40.0000 mg | ORAL_TABLET | Freq: Every day | ORAL | Status: DC
  Administered 2017-12-20 – 2017-12-22 (×3): 40 mg via ORAL
  Filled 2017-12-19 (×4): qty 1

## 2017-12-19 MED ORDER — QUINAPRIL HCL 10 MG PO TABS: 40.0000 mg | ORAL_TABLET | Freq: Every morning | ORAL | Status: DC

## 2017-12-19 NOTE — H&P (Signed)
PCP:  Binnie Rail, MD  The patient presents today for electrophysiology followup.  He was seen in May 2015 after a brief episode  of afib around time of shoulder surgery, from which he spontaneously converted to NSR.  Was started on  eliquis and metoprolol without further problems. He had a  echo and stress test at time of surgery with normal EF and low risk stress test.  Seen in afib clinic 11/29/17, for persistent afib that started around the time of a sinus infection a couple weeks ago, which   progressed to symptoms in his chest. He was seen by PCP and started on  antibiotics for 10 more days and  has  also been started on lasix 20 mg daily for swelling of LE's during all of this. He has felt very fatigued and short of breath as well. He knew he has not missed any eliquis  that week but is not sure before that. Metoprolol had also been increased to rate control pt. He is starting to feel better.CXR/labs were done at PCP office. Did also take some decongestants around the beginning of illness. Discussed preceeding to cardioversion after 3 weeks of uninterrupted anticoagulation.  Unfortunately, pt developed shortness of breath and presented to the ER with RVR and HF. TEE showed reduced EF at 45-50% with mild to mod MR, with enlarged left atrium. He was successfully diuresed, cardioverted and sent home.  In the afib clinic, f/u 12/14/17. Unfortunately, he has returned to afib, possibly as of  yesterday. Weight is stable, he is currently not experiencing shortness of breath.Does notice fatigue in afib. Discussed antiarrythmic's and they choose tikosyn, aware of cost. 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  Today, he denies symptoms of palpitations, chest pain, , orthopnea, PND, lower extremity edema, dizziness, presyncope,  syncope, or neurologic sequela.  Positive for shortness of breath and fatigue The patient feels that he is tolerating medications without difficulties and is otherwise without complaint today.         Past Medical History:  Diagnosis Date  . Diverticulosis 2006  . Erectile dysfunction   . GERD (gastroesophageal reflux disease)   . History of skin cancer   . History of syncope 1994   no etiology; no recurrence  . Hyperlipidemia    LDL goal = < 100, ideally < 70  . Hypertension   . Persistent atrial fibrillation ([place]) 03/06/2014  . PVC's (premature ventricular contractions)   . Rotator cuff tear         Past Surgical History:  Procedure Laterality Date  . CARDIOVERSION N/A 12/05/2017   Procedure: CARDIOVERSION;  Surgeon: Dorothy Spark, MD;  Location: [facility] ENDOSCOPY;  Service: Cardiovascular;  Laterality: N/A;  . COLONOSCOPY  2006   Diverticulosis  . CYSTECTOMY     lip  . CYSTECTOMY     anterior thorax-chest  . INGUINAL HERNIA REPAIR      x 2  . 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 SHOULDER OPEN ROTATOR CUFF REPAIR Right 04/23/2014   Procedure: RIGHT SHOULDER ROTATOR CUFF REPAIR WITH GRAFT AND ANCHORS . OPEN ACROMIONECTOMY;  Surgeon: Tobi Bastos, MD;  Location: [facility] ORS;  Service:  Orthopedics;  Laterality: Right;  . SPINE SURGERY  2012   Dr Gladstone Lighter  . TEE WITHOUT CARDIOVERSION N/A 12/05/2017   Procedure: TRANSESOPHAGEAL ECHOCARDIOGRAM (TEE);  Surgeon: Dorothy Spark, MD;  Location[facility] ENDOSCOPY;  Service: Cardiovascular;  Laterality: N/A;  . TONSILLECTOMY AND ADENOIDECTOMY            Current Outpatient Medications  Medication Sig Dispense Refill  . apixaban (ELIQUIS) 5 MG TABS tablet Take 1 tablet (5 mg total) by mouth 2 (two) times daily. 180 tablet 3  . cholecalciferol (VITAMIN  D) 1000 units tablet Take 1,000 Units by mouth daily.    . Cyanocobalamin (VITAMIN B-12) 5000 MCG SUBL Place 5,000 mcg under the tongue daily.    . furosemide (LASIX) 20 MG tablet Take 1 tablet (20 mg total) by mouth daily. 30 tablet 1  . metoprolol tartrate (LOPRESSOR) 50 MG tablet Take 2 tablets (100 mg total) by mouth 2 (two) times daily. 180 tablet 3  . Multiple Vitamins-Minerals (ICAPS AREDS 2 PO) Take 1 capsule by mouth 2 (two) times daily.    . quinapril (ACCUPRIL) 40 MG tablet Take 1 tablet (40 mg total) by mouth every morning. 90 tablet 3  . Zinc 50 MG CAPS Take 1 capsule by mouth every 3 (three) days.     No current facility-administered medications for this encounter.          Allergies  Allergen Reactions  . Glucosamine Itching    Itching feet and palms, tightness in chest  . Shellfish-Derived Products Itching    Itching feet and palms, tightness in chest    Social History        Socioeconomic History  . Marital status: Married    Spouse name: Not on file  . Number of children: Not on file  . Years of education: Not on file  . Highest education level: Not on file  Social Needs  . Financial resource strain: Not on file  . Food insecurity - worry: Not on file  . Food insecurity - inability: Not on file  . Transportation needs - medical: Not on file  . Transportation needs - non-medical: Not on file  Occupational History  . Not on file  Tobacco Use  . Smoking status: Former Smoker    Last attempt to quit: 04/26/1990    Years since quitting: 27.6  . Smokeless tobacco: Former Systems developer  . Tobacco comment: smoked [place] with 12 year abstinence ( 21 total years of smoking), up to 1 ppd  Substance and Sexual Activity  . Alcohol use: No    Alcohol/week: 0.0 oz  . Drug use: No  . Sexual activity: Not on file  Other Topics Concern  . Not on file  Social History Narrative   No regular exercise         Family History  Problem Relation  Age of Onset  . Heart attack Father 16  . Diabetes Mother   . Transient ischemic attack Mother 49  . Diabetes Sister   . Heart attack Sister 48  . Stomach cancer Maternal Grandfather   . Cirrhosis Paternal Uncle        alcoholic  . Colon cancer Neg Hx   . Colon polyps Neg Hx     ROS-  All systems are reviewed and are negative except as outlined in the HPI above  Physical Exam:    Vitals:   12/19/17 0925  BP: 132/84  Pulse: (!) 103  Weight: 184 lb (83.5 kg)  Height: 6' (1.829 m)    GEN- The patient is well appearing, alert and oriented x 3 today.   Head- normocephalic, atraumatic Eyes-  Sclera clear, conjunctiva pink Ears- hearing intact Oropharynx- clear Neck- supple, no JVP Lymph- no cervical lymphadenopathy Lungs- Clear to ausculation bilaterally, normal work of breathing Heart-  Irregular  rate and rhythm, no murmurs, rubs or gallops, PMI not laterally displaced GI- soft, NT, ND, + BS Extremities- no clubbing, cyanosis, or trace edema shin area to 1+ top of feet MS- no significant deformity or atrophy Skin- no rash or lesion Psych- euthymic mood, full affect Neuro- strength and sensation are intact  Ekg-afib at 103 bpm, RBBB, qrs int 118 bpm, qtc 468 ms EKG in SR 7/12018- qtc 429 ms with RBBB Epic records reviewed   Assessment and Plan:  1. Persistent atrial fibrillation  Associated HF with recent prolonged episode of  afib with RVR, EF at 45-50% Successful TEE guided cardioversion 1/15 but unfortunately with ERAF Currently rate controlled Continue metoprolol 100 mg bid  Discussed options of antiarrythmic's to restore SR, flecainide/multaq is not an option 2/2 to reduced EF Discussed Tikosyn vrs sotalol Wife aware of cost of Tikosyn and wants to proceed with hospitalization for drug  Risk vrs benefit of Tikosyn discussed with pt  No benadryl use  Continue lasix 20 mg daily Bmet/mag today with acceptable mag/K+for admission with crcl cal  at 81.62  2. Chadsvasc score of at least 2. Continue eliquis, states no missed doses  3. HTN Stable  today  Sent for admission to 6E, Chanetta Marshall, NP, aware of admission  Geroge Baseman. [place], [facility] [address] [place], [place] [address] [phone]   I have seen, examined the patient, and reviewed the above assessment and plan.  Changes to above are made where necessary.  On exam, iRRR.  ekg is reviewed and qt is appropriate for initiation of tikosyn.  Reports compliance with eliquis without any missed doses.  Co Sign: Thompson Grayer, MD 12/19/2017 3:43 PM

## 2017-12-19 NOTE — Care Management Note (Addendum)
Case Management Note  Patient Details  Name: Sergio Mack MRN: [id number] Date of Birth: 15-Mar-1946  Subjective/Objective: Pt presented for Tikosyn Load. Benefits Check in process and CM will make pt aware of cost once completed.                    Action/Plan: CM will assist with Rx for 14 day supply no refills via the [facility] and pt will need the original Rx for 90 day supply with 3 refills e-scribed to [employer]. No further needs from CM. Will continue to monitor.   Expected Discharge Date:                  Expected Discharge Plan:  Home/Self Care  In-House Referral:  NA  Discharge planning Services  CM Consult, Medication Assistance  Post Acute Care Choice:  NA Choice offered to:  NA  DME Arranged:  N/A DME Agency:  NA  HH Arranged:  NA HH Agency:  NA  Status of Service:  Completed, signed off  If discussed at [place] of Stay Meetings, dates discussed:    Additional Comments: 1051 12-20-17 Jacqlyn Krauss, RN,BSN [phone] Co pay for Mail Order 90 day supply: $338.00. Pt is aware of cost. No further needs from CM at this time.   Bethena Roys, RN 12/19/2017, 2:22 PM

## 2017-12-19 NOTE — Progress Notes (Signed)
Patient admitted to 6E23 from the Clinic via w/c.  Bed in low position, wheels locked.  Patient denies chest pain/shortness of breath.  Telemetry monitor applied.  Patient oriented to environment, including call bell, TV, meal times, and hourly rounding.  NP notified of arrival.  Will continue to monitor.

## 2017-12-19 NOTE — Discharge Instructions (Addendum)

## 2017-12-19 NOTE — Progress Notes (Signed)
Patient ID: Sergio Mack, male   DOB: 10-05-1946, 72 y.o.   MRN: [id number]      PCP:  Binnie Rail, MD  The patient presents today for electrophysiology followup.  He was seen in May 2015 after a brief episode  of afib around time of shoulder surgery, from which he spontaneously converted to NSR.  Was started on  eliquis and metoprolol without further problems. He had a  echo and stress test at time of surgery with normal EF and low risk stress test.  Seen in afib clinic 11/29/17, for persistent afib that started around the time of a sinus infection a couple weeks ago, which   progressed to symptoms in his chest. He was seen by PCP and started on  antibiotics for 10 more days and  has  also been started on lasix 20 mg daily for swelling of LE's during all of this. He has felt very fatigued and short of breath as well. He knew he has not missed any eliquis  that week but is not sure before that. Metoprolol had also been increased to rate control pt. He is starting to feel better.CXR/labs were done at PCP office. Did also take some decongestants around the beginning of illness. Discussed preceeding to cardioversion after 3 weeks of uninterrupted anticoagulation.  Unfortunately, pt developed shortness of breath and presented to the ER with RVR and HF. TEE showed reduced EF at 45-50% with mild to mod MR, with enlarged left atrium. He was successfully diuresed, cardioverted and sent home.  In the afib clinic, f/u 12/14/17. Unfortunately, he has returned to afib, possibly as of  yesterday. Weight is stable, he is currently not experiencing shortness of breath.Does notice fatigue in afib. Discussed antiarrythmic's and they choose tikosyn, aware of cost. 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  Today, he denies symptoms of palpitations, chest  pain, , orthopnea, PND, lower extremity edema, dizziness, presyncope, syncope, or neurologic sequela.  Positive for shortness of breath and fatigue The patient feels that he is tolerating medications without difficulties and is otherwise without complaint today.     Past Medical History:  Diagnosis Date  . Diverticulosis 2006  . Erectile dysfunction   . GERD (gastroesophageal reflux disease)   . History of skin cancer   . History of syncope 1994   no etiology; no recurrence  . Hyperlipidemia    LDL goal = < 100, ideally < 70  . Hypertension   . Persistent atrial fibrillation ([place]) 03/06/2014  . PVC's (premature ventricular contractions)   . Rotator cuff tear    Past Surgical History:  Procedure Laterality Date  . CARDIOVERSION N/A 12/05/2017   Procedure: CARDIOVERSION;  Surgeon: Dorothy Spark, MD;  Location: [facility] ENDOSCOPY;  Service: Cardiovascular;  Laterality: N/A;  . COLONOSCOPY  2006   Diverticulosis  . CYSTECTOMY     lip  . CYSTECTOMY     anterior thorax-chest  . INGUINAL HERNIA REPAIR      x 2  . 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 SHOULDER OPEN ROTATOR CUFF REPAIR Right 04/23/2014   Procedure: RIGHT SHOULDER ROTATOR CUFF REPAIR WITH GRAFT AND ANCHORS . OPEN ACROMIONECTOMY;  Surgeon: Kipp Brood  Gioffre, MD;  Location: [facility] ORS;  Service: Orthopedics;  Laterality: Right;  . SPINE SURGERY  2012   Dr Gladstone Lighter  . TEE WITHOUT CARDIOVERSION N/A 12/05/2017   Procedure: TRANSESOPHAGEAL ECHOCARDIOGRAM (TEE);  Surgeon: Dorothy Spark, MD;  Location[facility] ENDOSCOPY;  Service: Cardiovascular;  Laterality: N/A;  . TONSILLECTOMY AND ADENOIDECTOMY      Current Outpatient Medications  Medication Sig Dispense Refill  . apixaban (ELIQUIS) 5 MG TABS tablet Take 1 tablet (5 mg total) by mouth 2 (two) times daily. 180 tablet 3  .  cholecalciferol (VITAMIN D) 1000 units tablet Take 1,000 Units by mouth daily.    . Cyanocobalamin (VITAMIN B-12) 5000 MCG SUBL Place 5,000 mcg under the tongue daily.    . furosemide (LASIX) 20 MG tablet Take 1 tablet (20 mg total) by mouth daily. 30 tablet 1  . metoprolol tartrate (LOPRESSOR) 50 MG tablet Take 2 tablets (100 mg total) by mouth 2 (two) times daily. 180 tablet 3  . Multiple Vitamins-Minerals (ICAPS AREDS 2 PO) Take 1 capsule by mouth 2 (two) times daily.    . quinapril (ACCUPRIL) 40 MG tablet Take 1 tablet (40 mg total) by mouth every morning. 90 tablet 3  . Zinc 50 MG CAPS Take 1 capsule by mouth every 3 (three) days.     No current facility-administered medications for this encounter.     Allergies  Allergen Reactions  . Glucosamine Itching    Itching feet and palms, tightness in chest  . Shellfish-Derived Products Itching    Itching feet and palms, tightness in chest    Social History   Socioeconomic History  . Marital status: Married    Spouse name: Not on file  . Number of children: Not on file  . Years of education: Not on file  . Highest education level: Not on file  Social Needs  . Financial resource strain: Not on file  . Food insecurity - worry: Not on file  . Food insecurity - inability: Not on file  . Transportation needs - medical: Not on file  . Transportation needs - non-medical: Not on file  Occupational History  . Not on file  Tobacco Use  . Smoking status: Former Smoker    Last attempt to quit: 04/26/1990    Years since quitting: 27.6  . Smokeless tobacco: Former Systems developer  . Tobacco comment: smoked [place] with 12 year abstinence ( 21 total years of smoking), up to 1 ppd  Substance and Sexual Activity  . Alcohol use: No    Alcohol/week: 0.0 oz  . Drug use: No  . Sexual activity: Not on file  Other Topics Concern  . Not on file  Social History Narrative   No regular exercise    Family History  Problem Relation Age of Onset  . Heart  attack Father 52  . Diabetes Mother   . Transient ischemic attack Mother 68  . Diabetes Sister   . Heart attack Sister 87  . Stomach cancer Maternal Grandfather   . Cirrhosis Paternal Uncle        alcoholic  . Colon cancer Neg Hx   . Colon polyps Neg Hx     ROS-  All systems are reviewed and are negative except as outlined in the HPI above  Physical Exam: Vitals:   12/19/17 0925  BP: 132/84  Pulse: (!) 103  Weight: 184 lb (83.5 kg)  Height: 6' (1.829 m)    GEN- The patient is well appearing, alert and oriented  x 3 today.   Head- normocephalic, atraumatic Eyes-  Sclera clear, conjunctiva pink Ears- hearing intact Oropharynx- clear Neck- supple, no JVP Lymph- no cervical lymphadenopathy Lungs- Clear to ausculation bilaterally, normal work of breathing Heart-  Irregular  rate and rhythm, no murmurs, rubs or gallops, PMI not laterally displaced GI- soft, NT, ND, + BS Extremities- no clubbing, cyanosis, or trace edema shin area to 1+ top of feet MS- no significant deformity or atrophy Skin- no rash or lesion Psych- euthymic mood, full affect Neuro- strength and sensation are intact  Ekg-afib at 103 bpm, RBBB, qrs int 118 bpm, qtc 468 ms EKG in SR 7/12018- qtc 429 ms with RBBB Epic records reviewed   Assessment and Plan:  1. Persistent atrial fibrillation  Associated HF with recent prolonged episode of  afib with RVR, EF at 45-50% Successful TEE guided cardioversion 1/15 but unfortunately with ERAF Currently rate controlled Continue metoprolol 100 mg bid  Discussed options of antiarrythmic's to restore SR, flecainide/multaq is not an option 2/2 to reduced EF Discussed Tikosyn vrs sotalol Wife aware of cost of Tikosyn and wants to proceed with hospitalization for drug  Risk vrs benefit of Tikosyn discussed with pt  No benadryl use  Continue lasix 20 mg daily Bmet/mag today with acceptable mag/K+for admission with crcl cal at 81.62  2. Chadsvasc score of at least  2. Continue eliquis, states no missed doses  3. HTN Stable  today  Sent for admission to 6E, Chanetta Marshall, NP, aware of admission  Geroge Baseman. Mylz Yuan, [facility] [address] [place], [place] [address] [phone]

## 2017-12-19 NOTE — Progress Notes (Signed)
Pharmacy Review for Dofetilide (Tikosyn) Initiation  Admit Complaint: 72 y.o. male admitted 12/19/2017 with atrial fibrillation to be initiated on dofetilide.   Assessment:  Patient Exclusion Criteria: If any screening criteria checked as "Yes", then  patient  should NOT receive dofetilide until criteria item is corrected. If "Yes" please indicate correction plan.  YES  NO Patient  Exclusion Criteria Correction Plan  []  [x]  Baseline QTc interval is greater than or equal to 440 msec. IF above YES box checked dofetilide contraindicated unless patient has ICD; then may proceed if QTc 500-550 msec or with known ventricular conduction abnormalities may proceed with QTc 550-600 msec. QTc =  468 QTc acceptable with RBBB per EP team  []  [x]  Magnesium level is less than 1.8 mEq/l : Last magnesium:  Lab Results  Component Value Date   MG 2.0 12/19/2017         []  [x]  Potassium level is less than 4 mEq/l : Last potassium:  Lab Results  Component Value Date   K 4.3 12/19/2017         []  [x]  Patient is known or suspected to have a digoxin level greater than 2 ng/ml: No results found for: DIGOXIN    []  [x]  Creatinine clearance less than 20 ml/min (calculated using Cockcroft-Gault, actual body weight and serum creatinine): Estimated Creatinine Clearance: 75.6 mL/min (by C-G formula based on SCr of 0.97 mg/dL).    []  [x]  Patient has received drugs known to prolong the QT intervals within the last 48 hours (phenothiazines, tricyclics or tetracyclic antidepressants, erythromycin, H-1 antihistamines, cisapride, fluoroquinolones, azithromycin). Drugs not listed above may have an, as yet, undetected potential to prolong the QT interval, updated information on QT prolonging agents is available at this website:QT prolonging agents   []  [x]  Patient received a dose of hydrochlorothiazide (Oretic) alone or in any combination including triamterene (Dyazide, Maxzide) in the last 48 hours.   []  [x]  Patient  received a medication known to increase dofetilide plasma concentrations prior to initial dofetilide dose:  . Trimethoprim (Primsol, Proloprim) in the last 36 hours . Verapamil (Calan, Verelan) in the last 36 hours or a sustained release dose in the last 72 hours . Megestrol (Megace) in the last 5 days  . Cimetidine (Tagamet) in the last 6 hours . Ketoconazole (Nizoral) in the last 24 hours . Itraconazole (Sporanox) in the last 48 hours  . Prochlorperazine (Compazine) in the last 36 hours    []  [x]  Patient is known to have a history of torsades de pointes; congenital or acquired long QT syndromes.   []  [x]  Patient has received a Class 1 antiarrhythmic with less than 2 half-lives since last dose. (Disopyramide, Quinidine, Procainamide, Lidocaine, Mexiletine, Flecainide, Propafenone)   []  [x]  Patient has received amiodarone therapy in the past 3 months or amiodarone level is greater than 0.3 ng/ml.    Patient has been appropriately anticoagulated with Eliquis.  Ordering provider was confirmed at [email or web address] if they are not listed on the [place] Prescribers list.  Goal of Therapy: Follow renal function, electrolytes, potential drug interactions, and dose adjustment. Provide education and 1 week supply at discharge.  Plan:  [x]   Physician selected initial dose within range recommended for patients level of renal function - will monitor for response.  []   Physician selected initial dose outside of range recommended for patients level of renal function - will discuss if the dose should be altered at this time.   Select One Calculated CrCl  Dose q12h  [x]  >  60 ml/min 500 mcg  []  40-60 ml/min 250 mcg  []  20-40 ml/min 125 mcg   2. Follow up QTc after the first 5 doses, renal function, electrolytes (K & Mg) daily x 3     days, dose adjustment, success of initiation and facilitate 1 week discharge supply as     clinically indicated.  3. Initiate Tikosyn education video (Call  [phone] and ask for Tikosyn Video # 116).  4. First Tikosyn dose now, then midnight tonight, then ~q11 hrs to reach 7a/7p administration times as with Eliquis and Metoprolol.   Arty Baumgartner, [place] Pager: [phone] or [phone] 12/19/2017 2:04 PM

## 2017-12-20 LAB — BASIC METABOLIC PANEL
ANION GAP: 10 (ref 5–15)
BUN: 11 mg/dL (ref 6–20)
CALCIUM: 8.8 mg/dL — AB (ref 8.9–10.3)
CO2: 26 mmol/L (ref 22–32)
Chloride: 100 mmol/L — ABNORMAL LOW (ref 101–111)
Creatinine, Ser: 0.96 mg/dL (ref 0.61–1.24)
Glucose, Bld: 100 mg/dL — ABNORMAL HIGH (ref 65–99)
Potassium: 4.3 mmol/L (ref 3.5–5.1)
Sodium: 136 mmol/L (ref 135–145)

## 2017-12-20 LAB — MAGNESIUM: Magnesium: 2 mg/dL (ref 1.7–2.4)

## 2017-12-20 NOTE — Progress Notes (Signed)
   Electrophysiology Rounding Note  Patient Name: Sergio Mack Date of Encounter: 12/20/2017  Electrophysiologist: Allred   Subjective   The patient is doing well today.  At this time, the patient denies chest pain, shortness of breath, or any new concerns. He is symptomatically improved in SR   Inpatient Medications    Scheduled Meds: . apixaban  5 mg Oral BID  . dofetilide  500 mcg Oral BID  . furosemide  20 mg Oral Daily  . lisinopril  40 mg Oral Daily  . metoprolol tartrate  100 mg Oral BID  . sodium chloride flush  3 mL Intravenous Q12H   Continuous Infusions: . sodium chloride     PRN Meds: sodium chloride, sodium chloride flush   Vital Signs    Vitals:   12/19/17 1854 12/19/17 2100 12/20/17 0319 12/20/17 0553  BP: 117/80 116/82 (!) 141/95 137/77  Pulse: (!) 111 63 80   Resp: 17 18 16    Temp:  (!) 97.5 F (36.4 C) (!) 97.5 F (36.4 C)   TempSrc:  Oral Oral   SpO2: 95% 97% 98%   Weight:   177 lb 8 oz (80.5 kg)   Height:        Intake/Output Summary (Last 24 hours) at 12/20/2017 0919 Last data filed at 12/20/2017 5625 Gross per 24 hour  Intake 718 ml  Output -  Net 718 ml   Filed Weights   12/19/17 1200 12/19/17 1400 12/20/17 0319  Weight: 184 lb 1.4 oz (83.5 kg) 184 lb 1.4 oz (83.5 kg) 177 lb 8 oz (80.5 kg)    Physical Exam    GEN- The patient is well appearing, alert and oriented x 3 today.   Head- normocephalic, atraumatic Eyes-  Sclera clear, conjunctiva pink Ears- hearing intact Oropharynx- clear Neck- supple Lungs- Clear to ausculation bilaterally, normal work of breathing Heart- Regular rate and rhythm  GI- soft, NT, ND, + BS Extremities- no clubbing, cyanosis, or edema Skin- no rash or lesion Psych- euthymic mood, full affect Neuro- strength and sensation are intact  Labs    Basic Metabolic Panel Recent Labs    12/19/17 0940 12/20/17 0331  NA 133* 136  K 4.3 4.3  CL 98* 100*  CO2 26 26  GLUCOSE 94 100*  BUN 9 11    CREATININE 0.97 0.96  CALCIUM 9.1 8.8*  MG 2.0 2.0    Telemetry    Sinus rhythm (personally reviewed)  Radiology    No results found.   Patient Profile     Sergio Mack is a 72 y.o. male admitted for Tikosyn load  Assessment & Plan    1.  Persistent atrial fibrillation Admitted for Tikosyn load Converted after 1st dose BMET, Mg, QTc stable this morning Continue [place] for CHADS2VASC of 2  2.  HTN Stable No change required today   Signed, Chanetta Marshall, NP  12/20/2017, 9:19 AM   EP Attending  Patient seen and examined. Agree with the findings as noted above. The patient has reverted back to NSR and feels better. His QT interval is acceptable but will need to be watched carefully. Continue dofetilide as currently prescribed.   Mikle Bosworth.D.

## 2017-12-21 ENCOUNTER — Other Ambulatory Visit: Payer: Self-pay

## 2017-12-21 LAB — BASIC METABOLIC PANEL
Anion gap: 9 (ref 5–15)
BUN: 9 mg/dL (ref 6–20)
CHLORIDE: 101 mmol/L (ref 101–111)
CO2: 26 mmol/L (ref 22–32)
CREATININE: 0.96 mg/dL (ref 0.61–1.24)
Calcium: 8.9 mg/dL (ref 8.9–10.3)
GFR calc Af Amer: >60 mL/min (ref >60)
GLUCOSE: 101 mg/dL — AB (ref 65–99)
POTASSIUM: 4.7 mmol/L (ref 3.5–5.1)
Sodium: 136 mmol/L (ref 135–145)

## 2017-12-21 LAB — MAGNESIUM: MAGNESIUM: 1.9 mg/dL (ref 1.7–2.4)

## 2017-12-21 MED ORDER — METOPROLOL TARTRATE 100 MG PO TABS
100.0000 mg | ORAL_TABLET | Freq: Two times a day (BID) | ORAL | Status: DC
  Administered 2017-12-21 – 2017-12-22 (×2): 100 mg via ORAL
  Filled 2017-12-21 (×2): qty 1

## 2017-12-21 NOTE — Progress Notes (Signed)
Patient ID: Sergio Mack, male   DOB: [birth year], 72 y.o.   MRN: [id number]   Progress Note  Patient Name: Sergio Mack Date of Encounter: 12/21/2017  Primary Cardiologist: No primary care provider on file.   Subjective   No chest pain or sob.   Inpatient Medications    Scheduled Meds: . apixaban  5 mg Oral BID  . dofetilide  500 mcg Oral BID  . furosemide  20 mg Oral Daily  . lisinopril  40 mg Oral Daily  . metoprolol tartrate  100 mg Oral BID  . sodium chloride flush  3 mL Intravenous Q12H   Continuous Infusions: . sodium chloride     PRN Meds: sodium chloride, sodium chloride flush   Vital Signs    Vitals:   12/21/17 0631 12/21/17 1456 12/21/17 1945 12/21/17 1955  BP: 134/81 114/73 122/77   Pulse: 65 (!) 57 60 65  Resp:   18   Temp:  (!) 97.5 F (36.4 C) 98.2 F (36.8 C)   TempSrc:  Oral Oral   SpO2:   97%   Weight:      Height:        Intake/Output Summary (Last 24 hours) at 12/21/2017 2041 Last data filed at 12/21/2017 0900 Gross per 24 hour  Intake 183 ml  Output -  Net 183 ml   Filed Weights   12/19/17 1400 12/20/17 0319 12/21/17 0449  Weight: 184 lb 1.4 oz (83.5 kg) 177 lb 8 oz (80.5 kg) 177 lb 8 oz (80.5 kg)    Telemetry    nsr - Personally Reviewed  ECG    nsr with satisfactory QT interval. - Personally Reviewed  Physical Exam   GEN: No acute distress.   Neck: No JVD Cardiac: RRR, no murmurs, rubs, or gallops.  Respiratory: Clear to auscultation bilaterally. GI: Soft, nontender, non-distended  MS: No edema; No deformity. Neuro:  Nonfocal  Psych: Normal affect   Labs    Chemistry Recent Labs  Lab 12/19/17 0940 12/20/17 0331 12/21/17 0459  NA 133* 136 136  K 4.3 4.3 4.7  CL 98* 100* 101  CO2 26 26 26   GLUCOSE 94 100* 101*  BUN 9 11 9   CREATININE 0.97 0.96 0.96  CALCIUM 9.1 8.8* 8.9  GFRNONAA >60 >60 >60  GFRAA >60 >60 >60  ANIONGAP 9 10 9      HematologyNo results for input(s): WBC, RBC, HGB, HCT, MCV, MCH,  MCHC, RDW, PLT in the last 168 hours.  Cardiac EnzymesNo results for input(s): TROPONINI in the last 168 hours. No results for input(s): TROPIPOC in the last 168 hours.   BNPNo results for input(s): BNP, PROBNP in the last 168 hours.   DDimer No results for input(s): DDIMER in the last 168 hours.   Radiology    No results found.  Cardiac Studies   none  Patient Profile     72 y.o. male admitted for initiation of dofetilide.  Assessment & Plan    1. Persistent atrial fib - his QT is acceptable and he will undergo watchful waiting. Expect to DC home tomorrow. 2. HTN - his blood pressure has been reasonably well controlled. We will follow.  For questions or updates, please contact [place] Please consult [email or web address] for contact info under Cardiology/STEMI.      Signed, Cristopher Peru, MD  12/21/2017, 8:41 PM

## 2017-12-22 ENCOUNTER — Other Ambulatory Visit: Payer: Self-pay

## 2017-12-22 LAB — BASIC METABOLIC PANEL
Anion gap: 7 (ref 5–15)
BUN: 13 mg/dL (ref 6–20)
CO2: 28 mmol/L (ref 22–32)
Calcium: 8.7 mg/dL — ABNORMAL LOW (ref 8.9–10.3)
Chloride: 102 mmol/L (ref 101–111)
Creatinine, Ser: 1.1 mg/dL (ref 0.61–1.24)
GFR calc Af Amer: >60 mL/min (ref >60)
GFR calc non Af Amer: >60 mL/min (ref >60)
GLUCOSE: 103 mg/dL — AB (ref 65–99)
Potassium: 4.5 mmol/L (ref 3.5–5.1)
Sodium: 137 mmol/L (ref 135–145)

## 2017-12-22 LAB — MAGNESIUM: Magnesium: 2 mg/dL (ref 1.7–2.4)

## 2017-12-22 MED ORDER — DOFETILIDE 500 MCG PO CAPS: 500.0000 ug | ORAL_CAPSULE | Freq: Two times a day (BID) | ORAL | 11 refills | Status: DC

## 2017-12-22 MED ORDER — DOFETILIDE 500 MCG PO CAPS: 500.0000 ug | ORAL_CAPSULE | Freq: Two times a day (BID) | ORAL | 0 refills | Status: DC

## 2017-12-22 NOTE — Progress Notes (Signed)
Discharge instructions reviewed with pt. Pt denies any pain and states he is ready for d/c. IV d.c.

## 2017-12-22 NOTE — Discharge Summary (Signed)
Discharge Summary    Patient ID: Sergio Mack,  MRN: [id number], DOB/AGE: [birth year] 72 y.o.  Admit date: 12/19/2017 Discharge date: 12/22/2017  Primary Care Provider: Binnie Rail Primary Cardiologist: Dr Rayann Heman  Discharge Diagnoses    Active Problems:   Persistent atrial fibrillation (HCC)   Allergies Allergies  Allergen Reactions  . Glucosamine Itching and Other (See Comments)    Itching feet and palms, tightness in chest also  . Shellfish-Derived Products Shortness Of Breath and Itching    Itching feet and palms, tightness in chest also    Diagnostic Studies/Procedures    None _____________   History of Present Illness     Seen in afib clinic1/9/19,for persistent afib that started around the time of a sinus infection a couple weeks ago, which progressed to symptoms in his chest. He was seen by PCP and started on antibiotics for 10 more days and has also been started on lasix 20 mg daily for swelling of LE's during all of this. He has felt very fatigued and short of breath as well. He knewhe has not missed any eliquis thatweek but is not sure before that. Metoprolol hadalso been increased to rate control pt. He is starting to feel better.CXR/labs were done at PCP office. Did also take some decongestants around the beginning of illness. Discussed preceedingto cardioversion after 3 weeks of uninterrupted anticoagulation.  Unfortunately, pt developed shortness of breath and presented to the ER with RVR and HF. TEE showed reduced EF at 45-50% with mild to mod MR, with enlarged left atrium. He was successfully diuresed, cardioverted and sent home.  In the afib clinic, f/u 12/14/17. Unfortunately, he has returned to afib, possiblyas of yesterday. Weight is stable, he is currently not experiencing shortness of breath.Does notice fatigue in afib. Discussed antiarrythmic's and they choose tikosyn, aware of cost. Qtc in SR is acceptable, but around 460-470 ms in  Afib with RBBB  He returns to afib clinic, 1/29, for admission to hospital for tikosyn, No benadryl, no missed doses of anticoagulation.Weight is stable.  Today,hedenies symptoms of palpitations, chest pain, , orthopnea, PND, lower extremity edema, dizziness, presyncope, syncope, or neurologic sequela. Positive for shortness of breath and fatigue The patient feels that heis tolerating medications without difficulties and is otherwise without complaint today.   Hospital Course     Consultants: None   Mr. Minniefield did well with the Tikosyn. He was in atrial fibrillation on admission, but converted spontaneously to sinus rhythm.  He remained in sinus rhythm during the rest of his hospital stay.  He was continued on Xarelto.  His blood pressure was noted to be elevated, but improved once he was back in sinus rhythm.  He is to track this as well as his heart rate as an outpatient.  On 12/22/2017, he was seen by Dr. Lovena Le and all data were reviewed.  His ECG showed a QTC of 519 ms, but this is felt to be inaccurate, when manually measured, the QTC is acceptable.  Dr. Lovena Le did not feel a dose reduction was indicated.  Mr. Rister is doing well and considered stable for discharge, with early follow-up in the A. fib clinic arranged.  _____________  Discharge Vitals Blood pressure 131/81, pulse (!) 58, temperature 98.4 F (36.9 C), temperature source Oral, resp. rate 18, height 6' (1.829 m), weight 177 lb 8 oz (80.5 kg), SpO2 97 %.  Filed Weights   12/19/17 1400 12/20/17 0319 12/21/17 0449  Weight: 184 lb 1.4 oz (83.5  kg) 177 lb 8 oz (80.5 kg) 177 lb 8 oz (80.5 kg)  General: Well developed, well nourished, male in no acute distress Head: Eyes PERRLA, No xanthomas.   Normocephalic and atraumatic  Lungs: Clear bilaterally to auscultation. Heart: HRRR S1 S2, without MRG.  Pulses are 2+ & equal. No carotid bruit. No JVD. Abdomen: Bowel sounds are present, abdomen soft and non-tender without  masses or  hernias noted. Msk: Normal strength and tone for age. Extremities: No clubbing, cyanosis or edema.    Skin:  No rashes or lesions noted. Neuro: Alert and oriented X 3. Psych:  Good affect, responds appropriately   Labs & Radiologic Studies    CBC No results for input(s): WBC, NEUTROABS, HGB, HCT, MCV, PLT in the last 72 hours. Basic Metabolic Panel Recent Labs    12/21/17 0459 12/22/17 0357  NA 136 137  K 4.7 4.5  CL 101 102  CO2 26 28  GLUCOSE 101* 103*  BUN 9 13  CREATININE 0.96 1.10  CALCIUM 8.9 8.7*  MG 1.9 2.0   Liver Function Tests No results for input(s): AST, ALT, ALKPHOS, BILITOT, PROT, ALBUMIN in the last 72 hours. No results for input(s): LIPASE, AMYLASE in the last 72 hours. Cardiac Enzymes No results for input(s): CKTOTAL, CKMB, CKMBINDEX, TROPONINI in the last 72 hours. BNP Invalid input(s): POCBNP D-Dimer No results for input(s): DDIMER in the last 72 hours. Hemoglobin A1C No results for input(s): HGBA1C in the last 72 hours. Fasting Lipid Panel No results for input(s): CHOL, HDL, LDLCALC, TRIG, CHOLHDL, LDLDIRECT in the last 72 hours. Thyroid Function Tests No results for input(s): TSH, T4TOTAL, T3FREE, THYROIDAB in the last 72 hours.  Invalid input(s): FREET3 _____________    Disposition   Pt is being discharged home today in good condition.  Follow-up Plans & Appointments     Discharge Instructions    Diet - low sodium heart healthy   Complete by:  As directed    Increase activity slowly   Complete by:  As directed       Discharge Medications   Allergies as of 12/22/2017      Reactions   Glucosamine Itching, Other (See Comments)   Itching feet and palms, tightness in chest also   Shellfish-derived Products Shortness Of Breath, Itching   Itching feet and palms, tightness in chest also      Medication List    TAKE these medications   apixaban 5 MG Tabs tablet Commonly known as:  ELIQUIS Take 1 tablet (5 mg total)  by mouth 2 (two) times daily.   cholecalciferol 1000 units tablet Commonly known as:  VITAMIN D Take 1,000 Units by mouth daily.   dofetilide 500 MCG capsule Commonly known as:  TIKOSYN Take 1 capsule (500 mcg total) by mouth 2 (two) times daily.   furosemide 20 MG tablet Commonly known as:  LASIX Take 1 tablet (20 mg total) by mouth daily.   ICAPS AREDS 2 PO Take 1 capsule by mouth 2 (two) times daily.   ONE-A-DAY MENS 50+ ADVANTAGE Tabs Take 1 tablet by mouth daily.   metoprolol tartrate 50 MG tablet Commonly known as:  LOPRESSOR Take 2 tablets (100 mg total) by mouth 2 (two) times daily.   quinapril 40 MG tablet Commonly known as:  ACCUPRIL Take 1 tablet (40 mg total) by mouth every morning.   sodium chloride 0.65 % Soln nasal spray Commonly known as:  OCEAN Place 1 spray into both nostrils as needed for congestion.  Vitamin B-12 5000 MCG Subl Place 5,000 mcg under the tongue daily.   Zinc 50 MG Caps Take 50 mg by mouth every 3 (three) days.         Outstanding Labs/Studies   None  Duration of Discharge Encounter   Greater than 30 minutes including physician time.  Alcario Drought Barrett NP 12/22/2017, 12:09 PM  EP Attending  Patient seen and examined. Agree with above. The patient is stable for DC home. Usual followup.  Mikle Bosworth.D

## 2017-12-28 ENCOUNTER — Other Ambulatory Visit (HOSPITAL_COMMUNITY): Payer: Self-pay | Admitting: *Deleted

## 2017-12-28 ENCOUNTER — Ambulatory Visit (HOSPITAL_COMMUNITY)
Admission: RE | Admit: 2017-12-28 | Discharge: 2017-12-28 | Disposition: A | Discharge status: Home / Self Care | Payer: Medicare Other | Source: Ambulatory Visit | Attending: Nurse Practitioner | Admitting: Nurse Practitioner

## 2017-12-28 ENCOUNTER — Encounter (HOSPITAL_COMMUNITY): Payer: Self-pay | Admitting: Nurse Practitioner

## 2017-12-28 VITALS — BP 142/72 | HR 54 | Ht 72.0 in | Wt 187.0 lb

## 2017-12-28 DIAGNOSIS — Z8249 Family history of ischemic heart disease and other diseases of the circulatory system: Secondary | ICD-10-CM | POA: Diagnosis not present

## 2017-12-28 DIAGNOSIS — I481 Persistent atrial fibrillation: Secondary | ICD-10-CM | POA: Diagnosis not present

## 2017-12-28 DIAGNOSIS — E785 Hyperlipidemia, unspecified: Secondary | ICD-10-CM | POA: Insufficient documentation

## 2017-12-28 DIAGNOSIS — Z9889 Other specified postprocedural states: Secondary | ICD-10-CM | POA: Insufficient documentation

## 2017-12-28 DIAGNOSIS — Z87891 Personal history of nicotine dependence: Secondary | ICD-10-CM | POA: Insufficient documentation

## 2017-12-28 DIAGNOSIS — Z85828 Personal history of other malignant neoplasm of skin: Secondary | ICD-10-CM | POA: Insufficient documentation

## 2017-12-28 DIAGNOSIS — Z5189 Encounter for other specified aftercare: Secondary | ICD-10-CM | POA: Diagnosis not present

## 2017-12-28 DIAGNOSIS — Z7901 Long term (current) use of anticoagulants: Secondary | ICD-10-CM | POA: Insufficient documentation

## 2017-12-28 DIAGNOSIS — Z79899 Other long term (current) drug therapy: Secondary | ICD-10-CM | POA: Diagnosis not present

## 2017-12-28 DIAGNOSIS — N529 Male erectile dysfunction, unspecified: Secondary | ICD-10-CM | POA: Diagnosis not present

## 2017-12-28 DIAGNOSIS — I10 Essential (primary) hypertension: Secondary | ICD-10-CM | POA: Insufficient documentation

## 2017-12-28 DIAGNOSIS — Z833 Family history of diabetes mellitus: Secondary | ICD-10-CM | POA: Insufficient documentation

## 2017-12-28 DIAGNOSIS — I4819 Other persistent atrial fibrillation: Secondary | ICD-10-CM

## 2017-12-28 LAB — BASIC METABOLIC PANEL
ANION GAP: 11 (ref 5–15)
BUN: 6 mg/dL (ref 6–20)
CALCIUM: 9.3 mg/dL (ref 8.9–10.3)
CO2: 26 mmol/L (ref 22–32)
CREATININE: 0.93 mg/dL (ref 0.61–1.24)
Chloride: 97 mmol/L — ABNORMAL LOW (ref 101–111)
Glucose, Bld: 89 mg/dL (ref 65–99)
Potassium: 3.9 mmol/L (ref 3.5–5.1)
SODIUM: 134 mmol/L — AB (ref 135–145)

## 2017-12-28 LAB — MAGNESIUM: Magnesium: 2 mg/dL (ref 1.7–2.4)

## 2017-12-28 MED ORDER — POTASSIUM CHLORIDE ER 10 MEQ PO TBCR: 10.0000 meq | EXTENDED_RELEASE_TABLET | Freq: Every day | ORAL | 3 refills | Status: DC

## 2017-12-28 MED ORDER — FUROSEMIDE 20 MG PO TABS: 20.0000 mg | ORAL_TABLET | Freq: Every day | ORAL | 1 refills | Status: DC

## 2017-12-28 NOTE — Patient Instructions (Signed)
Let Sergio Mack know if Heart Rates are consistently in the 13s.

## 2017-12-28 NOTE — Progress Notes (Signed)
Patient ID: Sergio Mack, male   DOB: [birth year], 72 y.o.   MRN: [id number]      PCP:  Binnie Rail, MD  The patient presents today for electrophysiology followup.  He was seen in May 2015 after a brief episode  of afib around time of shoulder surgery, from which he spontaneously converted to NSR.  Was started on  eliquis and metoprolol without further problems. He had a  echo and stress test at time of surgery with normal EF and low risk stress test.  Seen in afib clinic 11/29/17, for persistent afib that started around the time of a sinus infection a couple weeks ago, which   progressed to symptoms in his chest. He was seen by PCP and started on  antibiotics for 10 more days and  has  also been started on lasix 20 mg daily for swelling of LE's during all of this. He has felt very fatigued and short of breath as well. He knew he has not missed any eliquis  that week but is not sure before that. Metoprolol had also been increased to rate control pt. He is starting to feel better.CXR/labs were done at PCP office. Did also take some decongestants around the beginning of illness. Discussed preceeding to cardioversion after 3 weeks of uninterrupted anticoagulation.  Unfortunately, pt developed shortness of breath and presented to the ER with RVR and HF. TEE showed reduced EF at 45-50% with mild to mod MR, with enlarged left atrium. He was successfully diuresed, cardioverted and sent home.  In the afib clinic, f/u 12/14/17. Unfortunately, he has returned to afib, possibly as of  yesterday. Weight is stable, he is currently not experiencing shortness of breath.Does notice fatigue in afib. Discussed antiarrythmic's and they choose tikosyn, aware of cost. 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  F/u in afib clinic, 2/7, after hospitalization for  Tikosyn load. He spontaneously converted to SR with Tikosyn.  In the afib clinic, he is maintaining SR, his weight at home without clothes is stable at 177 lbs.  He is feeling improved.Plans in place to get drug from mail order and it is on the way. Pt has another week supply. Continues on eliquis 5 mg bid.   Today, he denies symptoms of palpitations, chest pain, , orthopnea, PND, lower extremity edema, dizziness, presyncope, syncope, or neurologic sequela.  Positive for shortness of breath and fatigue The patient feels that he is tolerating medications without difficulties and is otherwise without complaint today.     Past Medical History:  Diagnosis Date  . Diverticulosis 2006  . Erectile dysfunction   . GERD (gastroesophageal reflux disease)   . History of skin cancer   . History of syncope 1994   no etiology; no recurrence  . Hyperlipidemia    LDL goal = < 100, ideally < 70  . Hypertension   . Persistent atrial fibrillation ([place]) 03/06/2014  . PVC's (premature ventricular contractions)   . Rotator cuff tear   . Visit for monitoring Tikosyn therapy 12/19/2017   Past Surgical History:  Procedure Laterality Date  . CARDIOVERSION N/A 12/05/2017   Procedure: CARDIOVERSION;  Surgeon: Dorothy Spark, MD;  Location[facility] ENDOSCOPY;  Service: Cardiovascular;  Laterality: N/A;  . COLONOSCOPY  2006   Diverticulosis  . CYSTECTOMY     lip  . CYSTECTOMY  anterior thorax-chest  . INGUINAL HERNIA REPAIR      x 2  . 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 SHOULDER OPEN ROTATOR CUFF REPAIR Right 04/23/2014   Procedure: RIGHT SHOULDER ROTATOR CUFF REPAIR WITH GRAFT AND ANCHORS . OPEN ACROMIONECTOMY;  Surgeon: Tobi Bastos, MD;  Location: [facility] ORS;  Service: Orthopedics;  Laterality: Right;  . SPINE SURGERY  2012   Dr Gladstone Lighter  . TEE WITHOUT  CARDIOVERSION N/A 12/05/2017   Procedure: TRANSESOPHAGEAL ECHOCARDIOGRAM (TEE);  Surgeon: Dorothy Spark, MD;  Location: [facility] ENDOSCOPY;  Service: Cardiovascular;  Laterality: N/A;  . TONSILLECTOMY AND ADENOIDECTOMY      Current Outpatient Medications  Medication Sig Dispense Refill  . apixaban (ELIQUIS) 5 MG TABS tablet Take 1 tablet (5 mg total) by mouth 2 (two) times daily. 180 tablet 3  . cholecalciferol (VITAMIN D) 1000 units tablet Take 1,000 Units by mouth daily.    . Cyanocobalamin (VITAMIN B-12) 5000 MCG SUBL Place 5,000 mcg under the tongue daily.    Marland Kitchen dofetilide (TIKOSYN) 500 MCG capsule Take 1 capsule (500 mcg total) by mouth 2 (two) times daily. 60 capsule 11  . furosemide (LASIX) 20 MG tablet Take 1 tablet (20 mg total) by mouth daily. 90 tablet 1  . metoprolol tartrate (LOPRESSOR) 50 MG tablet Take 2 tablets (100 mg total) by mouth 2 (two) times daily. 180 tablet 3  . Multiple Vitamins-Minerals (ICAPS AREDS 2 PO) Take 1 capsule by mouth 2 (two) times daily.    . Multiple Vitamins-Minerals (ONE-A-DAY MENS 50+ ADVANTAGE) TABS Take 1 tablet by mouth daily.    . quinapril (ACCUPRIL) 40 MG tablet Take 1 tablet (40 mg total) by mouth every morning. 90 tablet 3  . sodium chloride (OCEAN) 0.65 % SOLN nasal spray Place 1 spray into both nostrils as needed for congestion.    . Zinc 50 MG CAPS Take 50 mg by mouth every 3 (three) days.      No current facility-administered medications for this encounter.     Allergies  Allergen Reactions  . Glucosamine Itching and Other (See Comments)    Itching feet and palms, tightness in chest also  . Shellfish-Derived Products Shortness Of Breath and Itching    Itching feet and palms, tightness in chest also    Social History   Socioeconomic History  . Marital status: Married    Spouse name: Not on file  . Number of children: Not on file  . Years of education: Not on file  . Highest education level: Not on file  Social Needs  .  Financial resource strain: Not on file  . Food insecurity - worry: Not on file  . Food insecurity - inability: Not on file  . Transportation needs - medical: Not on file  . Transportation needs - non-medical: Not on file  Occupational History  . Not on file  Tobacco Use  . Smoking status: Former Smoker    Last attempt to quit: 04/26/1990    Years since quitting: 27.6  . Smokeless tobacco: Former Systems developer  . Tobacco comment: smoked [place] with 12 year abstinence ( 21 total years of smoking), up to 1 ppd  Substance and Sexual Activity  . Alcohol use: No    Alcohol/week: 0.0 oz  . Drug use: No  . Sexual activity: Not on file  Other Topics Concern  .  Not on file  Social History Narrative   No regular exercise    Family History  Problem Relation Age of Onset  . Heart attack Father 30  . Diabetes Mother   . Transient ischemic attack Mother 41  . Diabetes Sister   . Heart attack Sister 76  . Stomach cancer Maternal Grandfather   . Cirrhosis Paternal Uncle        alcoholic  . Colon cancer Neg Hx   . Colon polyps Neg Hx     ROS-  All systems are reviewed and are negative except as outlined in the HPI above  Physical Exam: Vitals:   12/28/17 0850  BP: (!) 142/72  Pulse: (!) 54  Weight: 187 lb (84.8 kg)  Height: 6' (1.829 m)    GEN- The patient is well appearing, alert and oriented x 3 today.   Head- normocephalic, atraumatic Eyes-  Sclera clear, conjunctiva pink Ears- hearing intact Oropharynx- clear Neck- supple, no JVP Lymph- no cervical lymphadenopathy Lungs- Clear to ausculation bilaterally, normal work of breathing Heart- regular  rate and rhythm, no murmurs, rubs or gallops, PMI not laterally displaced GI- soft, NT, ND, + BS Extremities- no clubbing, cyanosis, or trace edema shin area to 1+ top of feet MS- no significant deformity or atrophy Skin- no rash or lesion Psych- euthymic mood, full affect Neuro- strength and sensation are intact  Ekg-Sinus brady at  54 bpm, IRBBB, PR int 160 ms, qrs int 114 ms, qtc 449 ms(stable) Epic records reviewed   Assessment and Plan:  1. Persistent atrial fibrillation  Associated HF with recent prolonged episode of  afib with RVR, EF at 45-50% Successful TEE guided cardioversion 1/15 but unfortunately with ERAF Now in SR with successful Tikosyn loading Precautions re Tikosyn again reviewed Continue metoprolol 100 mg bid, but watch HR at home if dips into the 40's , I will consider reduction of BB No benadryl use  Continue lasix 20 mg daily, may try to wean off in future if weight is stable Bmet/mag today   2. Chadsvasc score of at least 2. Continue eliquis, states no missed doses  3. HTN Stable  Today  F/u in one month  Butch Penny C. Nakaya Mishkin, [facility] [address] [place], [place] [address] [phone]

## 2017-12-29 ENCOUNTER — Ambulatory Visit (HOSPITAL_COMMUNITY): Payer: Medicare Other | Admitting: Nurse Practitioner

## 2018-01-10 ENCOUNTER — Ambulatory Visit (HOSPITAL_COMMUNITY)
Admission: RE | Admit: 2018-01-10 | Discharge: 2018-01-10 | Disposition: A | Discharge status: Home / Self Care | Payer: Medicare Other | Source: Ambulatory Visit | Attending: Nurse Practitioner | Admitting: Nurse Practitioner

## 2018-01-10 DIAGNOSIS — I480 Paroxysmal atrial fibrillation: Secondary | ICD-10-CM | POA: Insufficient documentation

## 2018-01-10 LAB — BASIC METABOLIC PANEL
ANION GAP: 8 (ref 5–15)
BUN: 8 mg/dL (ref 6–20)
CALCIUM: 9.3 mg/dL (ref 8.9–10.3)
CHLORIDE: 99 mmol/L — AB (ref 101–111)
CO2: 27 mmol/L (ref 22–32)
Creatinine, Ser: 0.93 mg/dL (ref 0.61–1.24)
GFR calc non Af Amer: >60 mL/min (ref >60)
Glucose, Bld: 112 mg/dL — ABNORMAL HIGH (ref 65–99)
Potassium: 5.2 mmol/L — ABNORMAL HIGH (ref 3.5–5.1)
Sodium: 134 mmol/L — ABNORMAL LOW (ref 135–145)

## 2018-01-18 ENCOUNTER — Other Ambulatory Visit (HOSPITAL_COMMUNITY): Payer: Self-pay | Admitting: *Deleted

## 2018-01-18 MED ORDER — METOPROLOL TARTRATE 100 MG PO TABS
100.0000 mg | ORAL_TABLET | Freq: Two times a day (BID) | ORAL | 2 refills | Status: DC
Start: 2018-01-18 — End: 2018-06-04

## 2018-01-31 ENCOUNTER — Encounter (HOSPITAL_COMMUNITY): Payer: Self-pay | Admitting: Nurse Practitioner

## 2018-01-31 ENCOUNTER — Ambulatory Visit (HOSPITAL_COMMUNITY)
Admission: RE | Admit: 2018-01-31 | Discharge: 2018-01-31 | Disposition: A | Discharge status: Home / Self Care | Payer: Medicare Other | Source: Ambulatory Visit | Attending: Nurse Practitioner | Admitting: Nurse Practitioner

## 2018-01-31 VITALS — BP 118/72 | HR 56 | Ht 72.0 in | Wt 190.0 lb

## 2018-01-31 DIAGNOSIS — K219 Gastro-esophageal reflux disease without esophagitis: Secondary | ICD-10-CM | POA: Insufficient documentation

## 2018-01-31 DIAGNOSIS — Z9889 Other specified postprocedural states: Secondary | ICD-10-CM | POA: Diagnosis not present

## 2018-01-31 DIAGNOSIS — I481 Persistent atrial fibrillation: Secondary | ICD-10-CM | POA: Diagnosis not present

## 2018-01-31 DIAGNOSIS — Z91013 Allergy to seafood: Secondary | ICD-10-CM | POA: Diagnosis not present

## 2018-01-31 DIAGNOSIS — Z833 Family history of diabetes mellitus: Secondary | ICD-10-CM | POA: Insufficient documentation

## 2018-01-31 DIAGNOSIS — Z79899 Other long term (current) drug therapy: Secondary | ICD-10-CM | POA: Insufficient documentation

## 2018-01-31 DIAGNOSIS — E785 Hyperlipidemia, unspecified: Secondary | ICD-10-CM | POA: Diagnosis not present

## 2018-01-31 DIAGNOSIS — Z7901 Long term (current) use of anticoagulants: Secondary | ICD-10-CM | POA: Diagnosis not present

## 2018-01-31 DIAGNOSIS — Z85828 Personal history of other malignant neoplasm of skin: Secondary | ICD-10-CM | POA: Insufficient documentation

## 2018-01-31 DIAGNOSIS — Z888 Allergy status to other drugs, medicaments and biological substances status: Secondary | ICD-10-CM | POA: Insufficient documentation

## 2018-01-31 DIAGNOSIS — Z87891 Personal history of nicotine dependence: Secondary | ICD-10-CM | POA: Insufficient documentation

## 2018-01-31 DIAGNOSIS — Z8249 Family history of ischemic heart disease and other diseases of the circulatory system: Secondary | ICD-10-CM | POA: Diagnosis not present

## 2018-01-31 DIAGNOSIS — I4510 Unspecified right bundle-branch block: Secondary | ICD-10-CM | POA: Diagnosis not present

## 2018-01-31 DIAGNOSIS — I10 Essential (primary) hypertension: Secondary | ICD-10-CM | POA: Insufficient documentation

## 2018-01-31 DIAGNOSIS — Z800 Family history of malignant neoplasm of digestive organs: Secondary | ICD-10-CM | POA: Insufficient documentation

## 2018-01-31 DIAGNOSIS — I4819 Other persistent atrial fibrillation: Secondary | ICD-10-CM

## 2018-01-31 LAB — BASIC METABOLIC PANEL
ANION GAP: 7 (ref 5–15)
BUN: 8 mg/dL (ref 6–20)
CALCIUM: 8.7 mg/dL — AB (ref 8.9–10.3)
CHLORIDE: 98 mmol/L — AB (ref 101–111)
CO2: 26 mmol/L (ref 22–32)
Creatinine, Ser: 0.93 mg/dL (ref 0.61–1.24)
GFR calc Af Amer: >60 mL/min (ref >60)
GFR calc non Af Amer: >60 mL/min (ref >60)
GLUCOSE: 85 mg/dL (ref 65–99)
POTASSIUM: 4.3 mmol/L (ref 3.5–5.1)
Sodium: 131 mmol/L — ABNORMAL LOW (ref 135–145)

## 2018-01-31 LAB — MAGNESIUM: Magnesium: 2 mg/dL (ref 1.7–2.4)

## 2018-01-31 NOTE — Progress Notes (Signed)
Patient ID: Sergio Mack, male   DOB: 1946/09/29, 72 y.o.   MRN: [id number]      PCP:  Binnie Rail, MD  EP: Dr. Rayann Heman  The patient presents today for electrophysiology followup.  He was seen in May 2015 after a brief episode  of afib around time of shoulder surgery, from which he spontaneously converted to NSR.  Was started on  eliquis and metoprolol without further problems. He had a  echo and stress test at time of surgery with normal EF and low risk stress test.  Seen in afib clinic 11/29/17, for persistent afib that started around the time of a sinus infection a couple weeks ago, which   progressed to symptoms in his chest. He was seen by PCP and started on  antibiotics for 10 more days and  has  also been started on lasix 20 mg daily for swelling of LE's during all of this. He has felt very fatigued and short of breath as well. He knew he has not missed any eliquis  that week but is not sure before that. Metoprolol had also been increased to rate control pt. He is starting to feel better.CXR/labs were done at PCP office. Did also take some decongestants around the beginning of illness. Discussed preceeding to cardioversion after 3 weeks of uninterrupted anticoagulation.  Unfortunately, pt developed shortness of breath and presented to the ER with RVR and HF. TEE showed reduced EF at 45-50% with mild to mod MR, with enlarged left atrium. He was successfully diuresed, cardioverted and sent home.  In the afib clinic, f/u 12/14/17. Unfortunately, he has returned to afib, possibly as of  yesterday. Weight is stable, he is currently not experiencing shortness of breath.Does notice fatigue in afib. Discussed antiarrythmic's and they choose tikosyn, aware of cost. 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  F/u in afib clinic, 3/13.  Continues on tikosyn for afib and is staying in [place]. His weight /fluiid is stable. No shortness of breath. Feels well.  Today, he denies symptoms of palpitations, chest pain, , orthopnea, PND, lower extremity edema, dizziness, presyncope, syncope, or neurologic sequela.  Positive for shortness of breath and fatigue The patient feels that he is tolerating medications without difficulties and is otherwise without complaint today.     Past Medical History:  Diagnosis Date  . Diverticulosis 2006  . Erectile dysfunction   . GERD (gastroesophageal reflux disease)   . History of skin cancer   . History of syncope 1994   no etiology; no recurrence  . Hyperlipidemia    LDL goal = < 100, ideally < 70  . Hypertension   . Persistent atrial fibrillation ([place]) 03/06/2014  . PVC's (premature ventricular contractions)   . Rotator cuff tear   . Visit for monitoring Tikosyn therapy 12/19/2017   Past Surgical History:  Procedure Laterality Date  . CARDIOVERSION N/A 12/05/2017   Procedure: CARDIOVERSION;  Surgeon: Dorothy Spark, MD;  Location: [facility] ENDOSCOPY;  Service: Cardiovascular;  Laterality: N/A;  . COLONOSCOPY  2006   Diverticulosis  . CYSTECTOMY     lip  . CYSTECTOMY     anterior thorax-chest  . INGUINAL HERNIA REPAIR      x 2  . 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 SHOULDER OPEN ROTATOR CUFF REPAIR Right 04/23/2014   Procedure: RIGHT SHOULDER ROTATOR CUFF REPAIR WITH GRAFT AND ANCHORS . OPEN ACROMIONECTOMY;  Surgeon: Tobi Bastos, MD;  Location: [facility] ORS;  Service: Orthopedics;  Laterality: Right;  . SPINE SURGERY  2012   Dr Gladstone Lighter  . TEE WITHOUT CARDIOVERSION N/A 12/05/2017   Procedure: TRANSESOPHAGEAL ECHOCARDIOGRAM (TEE);  Surgeon: Dorothy Spark, MD;  Location: [facility] ENDOSCOPY;  Service: Cardiovascular;  Laterality: N/A;  .  TONSILLECTOMY AND ADENOIDECTOMY      Current Outpatient Medications  Medication Sig Dispense Refill  . apixaban (ELIQUIS) 5 MG TABS tablet Take 1 tablet (5 mg total) by mouth 2 (two) times daily. 180 tablet 3  . cholecalciferol (VITAMIN D) 1000 units tablet Take 1,000 Units by mouth daily.    . Cyanocobalamin (VITAMIN B-12) 5000 MCG SUBL Place 5,000 mcg under the tongue daily.    Marland Kitchen dofetilide (TIKOSYN) 500 MCG capsule Take 1 capsule (500 mcg total) by mouth 2 (two) times daily. 60 capsule 11  . furosemide (LASIX) 20 MG tablet Take 1 tablet (20 mg total) by mouth daily. 90 tablet 1  . metoprolol tartrate (LOPRESSOR) 100 MG tablet Take 1 tablet (100 mg total) by mouth 2 (two) times daily. 180 tablet 2  . Multiple Vitamins-Minerals (ICAPS AREDS 2 PO) Take 1 capsule by mouth 2 (two) times daily.    . Multiple Vitamins-Minerals (ONE-A-DAY MENS 50+ ADVANTAGE) TABS Take 1 tablet by mouth daily.    . quinapril (ACCUPRIL) 40 MG tablet Take 1 tablet (40 mg total) by mouth every morning. 90 tablet 3  . sodium chloride (OCEAN) 0.65 % SOLN nasal spray Place 1 spray into both nostrils as needed for congestion.    . Zinc 50 MG CAPS Take 50 mg by mouth every 3 (three) days.      No current facility-administered medications for this encounter.     Allergies  Allergen Reactions  . Glucosamine Itching and Other (See Comments)    Itching feet and palms, tightness in chest also  . Shellfish-Derived Products Shortness Of Breath and Itching    Itching feet and palms, tightness in chest also    Social History   Socioeconomic History  . Marital status: Married    Spouse name: Not on file  . Number of children: Not on file  . Years of education: Not on file  . Highest education level: Not on file  Social Needs  . Financial resource strain: Not on file  . Food insecurity - worry: Not on file  . Food insecurity - inability: Not on file  . Transportation needs - medical: Not on file  . Transportation  needs - non-medical: Not on file  Occupational History  . Not on file  Tobacco Use  . Smoking status: Former Smoker    Last attempt to quit: 04/26/1990    Years since quitting: 27.7  . Smokeless tobacco: Former Systems developer  . Tobacco comment: smoked [place] with 12 year abstinence ( 21 total years of smoking), up to 1 ppd  Substance and Sexual Activity  . Alcohol use: No    Alcohol/week: 0.0 oz  . Drug use: No  . Sexual activity: Not on file  Other Topics Concern  . Not on file  Social History Narrative   No regular exercise    Family History  Problem Relation Age of Onset  . Heart attack Father 78  . Diabetes  Mother   . Transient ischemic attack Mother 71  . Diabetes Sister   . Heart attack Sister 81  . Stomach cancer Maternal Grandfather   . Cirrhosis Paternal Uncle        alcoholic  . Colon cancer Neg Hx   . Colon polyps Neg Hx     ROS-  All systems are reviewed and are negative except as outlined in the HPI above  Physical Exam: Vitals:   01/31/18 0920  BP: 118/72  Pulse: (!) 56  Weight: 190 lb (86.2 kg)  Height: 6' (1.829 m)    GEN- The patient is well appearing, alert and oriented x 3 today.   Head- normocephalic, atraumatic Eyes-  Sclera clear, conjunctiva pink Ears- hearing intact Oropharynx- clear Neck- supple, no JVP Lymph- no cervical lymphadenopathy Lungs- Clear to ausculation bilaterally, normal work of breathing Heart-  regular  rate and rhythm, no murmurs, rubs or gallops, PMI not laterally displaced GI- soft, NT, ND, + BS Extremities- no clubbing, cyanosis, or trace edema shin area to 1+ top of feet MS- no significant deformity or atrophy Skin- no rash or lesion Psych- euthymic mood, full affect Neuro- strength and sensation are intact  Ekg-Sinus brady at 56 bpm, IRBBB, pr int 158 ms, qrs int 116 ms, qtc 486 ms EKG in SR 7/12018- qtc 429 ms with RBBB, qtc 486 ms Epic records reviewed   Assessment and Plan:  1. Persistent atrial  fibrillation  Maintaining SR on dofetlide 500 mcg bid Continue metoprolol 100 mg bid  Continue lasix 20 mg daily Bmet/mag today    2. Chadsvasc score of at least 2. Continue eliquis 5 mg bid  3. HTN Stable   F/u in 3 months in afib clinic  Butch Penny C. [place], [facility] [address] [place], [place] [address] [phone]

## 2018-02-06 ENCOUNTER — Telehealth (HOSPITAL_COMMUNITY): Payer: Self-pay | Admitting: *Deleted

## 2018-02-06 NOTE — Telephone Encounter (Signed)
Patient wife called in stating BP has been elevated over the last few weeks 150-170s/94-106. Pt denies change in weight or pulse. Pt BP has historically been controlled at office visits - will bring in for nurse visit to compare BP cuff to manual readings and adjust meds accordingly. Pt wife in agreement with this plan.

## 2018-02-08 ENCOUNTER — Ambulatory Visit (HOSPITAL_COMMUNITY)
Admission: RE | Admit: 2018-02-08 | Discharge: 2018-02-08 | Disposition: A | Discharge status: Home / Self Care | Payer: Medicare Other | Source: Ambulatory Visit | Attending: Nurse Practitioner | Admitting: Nurse Practitioner

## 2018-02-08 VITALS — BP 138/76 | HR 69 | Ht 72.0 in

## 2018-02-08 DIAGNOSIS — I10 Essential (primary) hypertension: Secondary | ICD-10-CM | POA: Diagnosis not present

## 2018-02-08 DIAGNOSIS — I481 Persistent atrial fibrillation: Secondary | ICD-10-CM | POA: Diagnosis not present

## 2018-02-08 DIAGNOSIS — K219 Gastro-esophageal reflux disease without esophagitis: Secondary | ICD-10-CM | POA: Insufficient documentation

## 2018-02-08 DIAGNOSIS — Z833 Family history of diabetes mellitus: Secondary | ICD-10-CM | POA: Insufficient documentation

## 2018-02-08 DIAGNOSIS — Z9889 Other specified postprocedural states: Secondary | ICD-10-CM | POA: Diagnosis not present

## 2018-02-08 DIAGNOSIS — Z85828 Personal history of other malignant neoplasm of skin: Secondary | ICD-10-CM | POA: Diagnosis not present

## 2018-02-08 DIAGNOSIS — Z808 Family history of malignant neoplasm of other organs or systems: Secondary | ICD-10-CM | POA: Diagnosis not present

## 2018-02-08 DIAGNOSIS — N529 Male erectile dysfunction, unspecified: Secondary | ICD-10-CM | POA: Diagnosis not present

## 2018-02-08 DIAGNOSIS — Z7901 Long term (current) use of anticoagulants: Secondary | ICD-10-CM | POA: Insufficient documentation

## 2018-02-08 DIAGNOSIS — Z87891 Personal history of nicotine dependence: Secondary | ICD-10-CM | POA: Insufficient documentation

## 2018-02-08 DIAGNOSIS — Z79899 Other long term (current) drug therapy: Secondary | ICD-10-CM | POA: Insufficient documentation

## 2018-02-08 MED ORDER — AMLODIPINE BESYLATE 2.5 MG PO TABS: 2.5000 mg | ORAL_TABLET | ORAL | 3 refills | Status: DC

## 2018-02-08 NOTE — Progress Notes (Signed)
Pt in for BP check against his home monitor.   See clinic note

## 2018-02-08 NOTE — Progress Notes (Signed)
Patient ID: Sergio Mack, male   DOB: 1946-10-17, 72 y.o.   MRN: [id number]      PCP:  Binnie Rail, MD  EP: Dr. Rayann Heman  The patient presents today for electrophysiology followup.  He was seen in May 2015 after a brief episode  of afib around time of shoulder surgery, from which he spontaneously converted to NSR.  Was started on  eliquis and metoprolol without further problems. He had a  echo and stress test at time of surgery with normal EF and low risk stress test.  Seen in afib clinic 11/29/17, for persistent afib that started around the time of a sinus infection a couple weeks ago, which   progressed to symptoms in his chest. He was seen by PCP and started on  antibiotics for 10 more days and  has  also been started on lasix 20 mg daily for swelling of LE's during all of this. He has felt very fatigued and short of breath as well. He knew he has not missed any eliquis  that week but is not sure before that. Metoprolol had also been increased to rate control pt. He is starting to feel better.CXR/labs were done at PCP office. Did also take some decongestants around the beginning of illness. Discussed preceeding to cardioversion after 3 weeks of uninterrupted anticoagulation.  Unfortunately, pt developed shortness of breath and presented to the ER with RVR and HF. TEE showed reduced EF at 45-50% with mild to mod MR, with enlarged left atrium. He was successfully diuresed, cardioverted and sent home.  In the afib clinic, f/u 12/14/17. Unfortunately, he has returned to afib, possibly as of  yesterday. Weight is stable, he is currently not experiencing shortness of breath.Does notice fatigue in afib. Discussed antiarrythmic's and they choose tikosyn, aware of cost. 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  F/u in afib clinic, 3/13.  Continues on tikosyn for afib and is staying in [place]. His weight /fluiid is stable. No shortness of breath. Feels well.  Asked to be seen today, 3/21 for BP issues at home. He is being very careful with salt but hs several BP readings in the 160-170 sys range, others are well controlled. We compared his cuff to ours and it is comparable.  Today, he denies symptoms of palpitations, chest pain, , orthopnea, PND, lower extremity edema, dizziness, presyncope, syncope, or neurologic sequela.  Positive for shortness of breath and fatigue The patient feels that he is tolerating medications without difficulties and is otherwise without complaint today.     Past Medical History:  Diagnosis Date  . Diverticulosis 2006  . Erectile dysfunction   . GERD (gastroesophageal reflux disease)   . History of skin cancer   . History of syncope 1994   no etiology; no recurrence  . Hyperlipidemia    LDL goal = < 100, ideally < 70  . Hypertension   . Persistent atrial fibrillation ([place]) 03/06/2014  . PVC's (premature ventricular contractions)   . Rotator cuff tear   . Visit for monitoring Tikosyn therapy 12/19/2017   Past Surgical History:  Procedure Laterality Date  . CARDIOVERSION N/A 12/05/2017   Procedure: CARDIOVERSION;  Surgeon: Dorothy Spark, MD;  Location: [facility] ENDOSCOPY;  Service: Cardiovascular;  Laterality: N/A;  . COLONOSCOPY  2006   Diverticulosis  . CYSTECTOMY  lip  . CYSTECTOMY     anterior thorax-chest  . INGUINAL HERNIA REPAIR      x 2  . 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 SHOULDER OPEN ROTATOR CUFF REPAIR Right 04/23/2014   Procedure: RIGHT SHOULDER ROTATOR CUFF REPAIR WITH GRAFT AND ANCHORS . OPEN ACROMIONECTOMY;  Surgeon: Tobi Bastos, MD;  Location: [facility] ORS;  Service: Orthopedics;  Laterality: Right;  . SPINE SURGERY  2012    Dr Gladstone Lighter  . TEE WITHOUT CARDIOVERSION N/A 12/05/2017   Procedure: TRANSESOPHAGEAL ECHOCARDIOGRAM (TEE);  Surgeon: Dorothy Spark, MD;  Location: [facility] ENDOSCOPY;  Service: Cardiovascular;  Laterality: N/A;  . TONSILLECTOMY AND ADENOIDECTOMY      Current Outpatient Medications  Medication Sig Dispense Refill  . amLODipine (NORVASC) 2.5 MG tablet Take 1 tablet (2.5 mg total) by mouth daily after supper. 30 tablet 3  . apixaban (ELIQUIS) 5 MG TABS tablet Take 1 tablet (5 mg total) by mouth 2 (two) times daily. 180 tablet 3  . cholecalciferol (VITAMIN D) 1000 units tablet Take 1,000 Units by mouth daily.    . Cyanocobalamin (VITAMIN B-12) 5000 MCG SUBL Place 5,000 mcg under the tongue daily.    Marland Kitchen dofetilide (TIKOSYN) 500 MCG capsule Take 1 capsule (500 mcg total) by mouth 2 (two) times daily. 60 capsule 11  . furosemide (LASIX) 20 MG tablet Take 1 tablet (20 mg total) by mouth daily. 90 tablet 1  . metoprolol tartrate (LOPRESSOR) 100 MG tablet Take 1 tablet (100 mg total) by mouth 2 (two) times daily. 180 tablet 2  . Multiple Vitamins-Minerals (ICAPS AREDS 2 PO) Take 1 capsule by mouth 2 (two) times daily.    . Multiple Vitamins-Minerals (ONE-A-DAY MENS 50+ ADVANTAGE) TABS Take 1 tablet by mouth daily.    . quinapril (ACCUPRIL) 40 MG tablet Take 1 tablet (40 mg total) by mouth every morning. 90 tablet 3  . sodium chloride (OCEAN) 0.65 % SOLN nasal spray Place 1 spray into both nostrils as needed for congestion.    . Zinc 50 MG CAPS Take 50 mg by mouth every 3 (three) days.      No current facility-administered medications for this encounter.     Allergies  Allergen Reactions  . Glucosamine Itching and Other (See Comments)    Itching feet and palms, tightness in chest also  . Shellfish-Derived Products Shortness Of Breath and Itching    Itching feet and palms, tightness in chest also    Social History   Socioeconomic History  . Marital status: Married    Spouse name: Not on file    . Number of children: Not on file  . Years of education: Not on file  . Highest education level: Not on file  Occupational History  . Not on file  Social Needs  . Financial resource strain: Not on file  . Food insecurity:    Worry: Not on file    Inability: Not on file  . Transportation needs:    Medical: Not on file    Non-medical: Not on file  Tobacco Use  . Smoking status: Former Smoker    Last attempt to quit: 04/26/1990    Years since quitting: 27.8  . Smokeless tobacco: Former Systems developer  . Tobacco comment: smoked [place] with 12 year abstinence ( 21 total years of smoking), up to 1 ppd  Substance  and Sexual Activity  . Alcohol use: No    Alcohol/week: 0.0 oz  . Drug use: No  . Sexual activity: Not on file  Lifestyle  . Physical activity:    Days per week: Not on file    Minutes per session: Not on file  . Stress: Not on file  Relationships  . Social connections:    Talks on phone: Not on file    Gets together: Not on file    Attends religious service: Not on file    Active member of club or organization: Not on file    Attends meetings of clubs or organizations: Not on file    Relationship status: Not on file  . Intimate partner violence:    Fear of current or ex partner: Not on file    Emotionally abused: Not on file    Physically abused: Not on file    Forced sexual activity: Not on file  Other Topics Concern  . Not on file  Social History Narrative   No regular exercise    Family History  Problem Relation Age of Onset  . Heart attack Father 76  . Diabetes Mother   . Transient ischemic attack Mother 64  . Diabetes Sister   . Heart attack Sister 73  . Stomach cancer Maternal Grandfather   . Cirrhosis Paternal Uncle        alcoholic  . Colon cancer Neg Hx   . Colon polyps Neg Hx     ROS-  All systems are reviewed and are negative except as outlined in the HPI above  Physical Exam: Vitals:   02/08/18 0954  BP: 138/76  Pulse: 69  SpO2: 98%   Height: 6' (1.829 m)    GEN- The patient is well appearing, alert and oriented x 3 today.   Head- normocephalic, atraumatic Eyes-  Sclera clear, conjunctiva pink Ears- hearing intact Oropharynx- clear Neck- supple, no JVP Lymph- no cervical lymphadenopathy Lungs- Clear to ausculation bilaterally, normal work of breathing Heart-  regular  rate and rhythm, no murmurs, rubs or gallops, PMI not laterally displaced GI- soft, NT, ND, + BS Extremities- no clubbing, cyanosis, or trace edema shin area to 1+ top of feet MS- no significant deformity or atrophy Skin- no rash or lesion Psych- euthymic mood, full affect Neuro- strength and sensation are intact  Ekg-not done today EKG in SR 7/12018- qtc 429 ms with RBBB, qtc 486 ms Epic records reviewed   Assessment and Plan:  1. Persistent atrial fibrillation  Maintaining SR on dofetlide 500 mcg bid Continue metoprolol 100 mg bid  Continue lasix 20 mg daily  2. Chadsvasc score of at least 2. Continue eliquis 5 mg bid  3. HTN  Elevated readings over the last few weeks Avoiding salt Will start amlodipine 2.5 mg daily in the evening He will send in readings over my chart in 2 weeks for further evaluation   F/u in 3 months in afib clinic  [place]. Azam Gervasi, [facility] [address] [place], [place] [address] [phone]

## 2018-02-08 NOTE — Patient Instructions (Signed)
Start Norvasc 2.5mg  once a day with supper  Send in blood pressure readings 2 weeks

## 2018-02-08 NOTE — Progress Notes (Signed)
Pt in for BP check against his home monitor.

## 2018-02-15 ENCOUNTER — Encounter (HOSPITAL_COMMUNITY): Payer: Self-pay | Admitting: Nurse Practitioner

## 2018-02-15 ENCOUNTER — Other Ambulatory Visit (HOSPITAL_COMMUNITY): Payer: Self-pay | Admitting: *Deleted

## 2018-02-15 DIAGNOSIS — L570 Actinic keratosis: Secondary | ICD-10-CM | POA: Diagnosis not present

## 2018-02-15 DIAGNOSIS — L814 Other melanin hyperpigmentation: Secondary | ICD-10-CM | POA: Diagnosis not present

## 2018-02-15 DIAGNOSIS — D225 Melanocytic nevi of trunk: Secondary | ICD-10-CM | POA: Diagnosis not present

## 2018-02-15 DIAGNOSIS — L853 Xerosis cutis: Secondary | ICD-10-CM | POA: Diagnosis not present

## 2018-02-15 MED ORDER — AMLODIPINE BESYLATE 2.5 MG PO TABS
2.5000 mg | ORAL_TABLET | ORAL | 3 refills | Status: DC
Start: 2018-02-15 — End: 2018-03-05

## 2018-03-02 ENCOUNTER — Encounter (HOSPITAL_COMMUNITY): Payer: Self-pay | Admitting: Nurse Practitioner

## 2018-03-04 ENCOUNTER — Encounter (HOSPITAL_COMMUNITY): Payer: Self-pay | Admitting: Nurse Practitioner

## 2018-03-05 ENCOUNTER — Other Ambulatory Visit (HOSPITAL_COMMUNITY): Payer: Self-pay | Admitting: *Deleted

## 2018-03-05 MED ORDER — AMLODIPINE BESYLATE 5 MG PO TABS: 5.0000 mg | ORAL_TABLET | ORAL | 3 refills | Status: DC

## 2018-03-26 ENCOUNTER — Encounter (HOSPITAL_COMMUNITY): Payer: Self-pay | Admitting: Nurse Practitioner

## 2018-03-27 ENCOUNTER — Other Ambulatory Visit (HOSPITAL_COMMUNITY): Payer: Self-pay | Admitting: *Deleted

## 2018-03-27 MED ORDER — AMLODIPINE BESYLATE 5 MG PO TABS: 5.0000 mg | ORAL_TABLET | Freq: Every day | ORAL | 3 refills | Status: DC

## 2018-04-04 ENCOUNTER — Encounter (HOSPITAL_COMMUNITY): Payer: Self-pay | Admitting: Nurse Practitioner

## 2018-04-20 ENCOUNTER — Encounter: Payer: Self-pay | Admitting: Internal Medicine

## 2018-04-20 ENCOUNTER — Ambulatory Visit (INDEPENDENT_AMBULATORY_CARE_PROVIDER_SITE_OTHER)
Admission: RE | Admit: 2018-04-20 | Discharge: 2018-04-20 | Disposition: A | Discharge status: Home / Self Care | Payer: Medicare Other | Source: Ambulatory Visit | Attending: Internal Medicine | Admitting: Internal Medicine

## 2018-04-20 ENCOUNTER — Ambulatory Visit (INDEPENDENT_AMBULATORY_CARE_PROVIDER_SITE_OTHER): Payer: Medicare Other | Admitting: Internal Medicine

## 2018-04-20 VITALS — BP 152/84 | HR 64 | Temp 98.0°F | Resp 16 | Wt 194.0 lb

## 2018-04-20 DIAGNOSIS — R220 Localized swelling, mass and lump, head: Secondary | ICD-10-CM | POA: Diagnosis not present

## 2018-04-20 DIAGNOSIS — R0781 Pleurodynia: Secondary | ICD-10-CM

## 2018-04-20 DIAGNOSIS — S0990XA Unspecified injury of head, initial encounter: Secondary | ICD-10-CM

## 2018-04-20 DIAGNOSIS — T148XXA Other injury of unspecified body region, initial encounter: Secondary | ICD-10-CM | POA: Diagnosis not present

## 2018-04-20 DIAGNOSIS — H353131 Nonexudative age-related macular degeneration, bilateral, early dry stage: Secondary | ICD-10-CM | POA: Diagnosis not present

## 2018-04-20 DIAGNOSIS — S0081XA Abrasion of other part of head, initial encounter: Secondary | ICD-10-CM | POA: Diagnosis not present

## 2018-04-20 MED ORDER — MUPIROCIN 2 % EX OINT: 1.0000 "application " | TOPICAL_OINTMENT | Freq: Two times a day (BID) | CUTANEOUS | 0 refills | Status: DC

## 2018-04-20 MED ORDER — AMOXICILLIN-POT CLAVULANATE 875-125 MG PO TABS
1.0000 | ORAL_TABLET | Freq: Two times a day (BID) | ORAL | 0 refills | Status: DC
Start: 2018-04-20 — End: 2018-04-29

## 2018-04-20 NOTE — Assessment & Plan Note (Signed)
No concerning neuro symptoms, but has left cheek swelling and on eliquis Will ct facial bones and brain Advised to call if he develops any concerning symptoms

## 2018-04-20 NOTE — Assessment & Plan Note (Signed)
Left cheek with small cut and area of discharge - serous now, but was pus earlier Will get maxillofacial CT to r/o fx Start augmentin for possible cellulitis/abscess Td up to date

## 2018-04-20 NOTE — Assessment & Plan Note (Signed)
Unlikely fx - likely just a contusion Symptomatic treatment Monitor for pneumonia symptoms
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
## 2018-04-20 NOTE — Patient Instructions (Addendum)
Take the oral antibiotic. Apply the topical antibacterial ointment to all abrasions and cuts.   If you experience any headaches, lightheadedness/dizziness, confusion or increased facial swelling please call.

## 2018-04-20 NOTE — Assessment & Plan Note (Signed)
Several abrasion on face, hands, left lower leg Can apply antibacterial ointment

## 2018-04-23 ENCOUNTER — Other Ambulatory Visit (HOSPITAL_COMMUNITY): Payer: Self-pay | Admitting: Nurse Practitioner
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
## 2018-04-29 NOTE — Patient Instructions (Addendum)
  Test(s) ordered today. Your results will be released to MyChart (or called to you) after review, usually within 72hours after test completion. If any changes need to be made, you will be notified at that same time.  Medications reviewed and updated.  No changes recommended at this time.    Please followup in 6 months   

## 2018-04-30 ENCOUNTER — Ambulatory Visit (INDEPENDENT_AMBULATORY_CARE_PROVIDER_SITE_OTHER): Payer: Medicare Other | Admitting: Internal Medicine

## 2018-04-30 ENCOUNTER — Encounter: Payer: Self-pay | Admitting: Internal Medicine

## 2018-04-30 ENCOUNTER — Other Ambulatory Visit (INDEPENDENT_AMBULATORY_CARE_PROVIDER_SITE_OTHER): Payer: Medicare Other

## 2018-04-30 VITALS — BP 128/80 | HR 53 | Temp 98.1°F | Resp 16 | Wt 192.0 lb

## 2018-04-30 DIAGNOSIS — I481 Persistent atrial fibrillation: Secondary | ICD-10-CM

## 2018-04-30 DIAGNOSIS — I10 Essential (primary) hypertension: Secondary | ICD-10-CM

## 2018-04-30 DIAGNOSIS — I4819 Other persistent atrial fibrillation: Secondary | ICD-10-CM

## 2018-04-30 DIAGNOSIS — R7303 Prediabetes: Secondary | ICD-10-CM | POA: Diagnosis not present

## 2018-04-30 DIAGNOSIS — I5022 Chronic systolic (congestive) heart failure: Secondary | ICD-10-CM | POA: Diagnosis not present

## 2018-04-30 LAB — CBC WITH DIFFERENTIAL/PLATELET
BASOS PCT: 1.2 % (ref 0.0–3.0)
Basophils Absolute: 0.1 10*3/uL (ref 0.0–0.1)
EOS PCT: 1.4 % (ref 0.0–5.0)
Eosinophils Absolute: 0.1 10*3/uL (ref 0.0–0.7)
HCT: 46.1 % (ref 39.0–52.0)
HEMOGLOBIN: 15.3 g/dL (ref 13.0–17.0)
LYMPHS PCT: 20.6 % (ref 12.0–46.0)
Lymphs Abs: 2 10*3/uL (ref 0.7–4.0)
MCHC: 33.3 g/dL (ref 30.0–36.0)
MCV: 93.2 fl (ref 78.0–100.0)
Monocytes Absolute: 0.9 10*3/uL (ref 0.1–1.0)
Monocytes Relative: 9.3 % (ref 3.0–12.0)
Neutro Abs: 6.6 10*3/uL (ref 1.4–7.7)
Neutrophils Relative %: 67.5 % (ref 43.0–77.0)
Platelets: 280 10*3/uL (ref 150.0–400.0)
RBC: 4.94 Mil/uL (ref 4.22–5.81)
RDW: 14.7 % (ref 11.5–15.5)
WBC: 9.7 10*3/uL (ref 4.0–10.5)

## 2018-04-30 LAB — COMPREHENSIVE METABOLIC PANEL
ALBUMIN: 4.1 g/dL (ref 3.5–5.2)
ALT: 24 U/L (ref 0–53)
AST: 24 U/L (ref 0–37)
Alkaline Phosphatase: 103 U/L (ref 39–117)
BUN: 8 mg/dL (ref 6–23)
CALCIUM: 9.3 mg/dL (ref 8.4–10.5)
CHLORIDE: 98 meq/L (ref 96–112)
CO2: 29 mEq/L (ref 19–32)
Creatinine, Ser: 0.89 mg/dL (ref 0.40–1.50)
GFR: 89.14 mL/min (ref >60.00)
Glucose, Bld: 101 mg/dL — ABNORMAL HIGH (ref 70–99)
POTASSIUM: 4.7 meq/L (ref 3.5–5.1)
Sodium: 133 mEq/L — ABNORMAL LOW (ref 135–145)
Total Bilirubin: 0.5 mg/dL (ref 0.2–1.2)
Total Protein: 7 g/dL (ref 6.0–8.3)

## 2018-04-30 LAB — LIPID PANEL
CHOLESTEROL: 123 mg/dL (ref 0–200)
HDL: 43.4 mg/dL (ref >39.00)
LDL Cholesterol: 69 mg/dL (ref 0–99)
NonHDL: 79.37
Total CHOL/HDL Ratio: 3
Triglycerides: 50 mg/dL (ref 0.0–149.0)
VLDL: 10 mg/dL (ref 0.0–40.0)

## 2018-04-30 LAB — TSH: TSH: 1.92 u[IU]/mL (ref 0.35–4.50)

## 2018-04-30 LAB — HEMOGLOBIN A1C: HEMOGLOBIN A1C: 5.6 % (ref 4.6–6.5)

## 2018-04-30 NOTE — Assessment & Plan Note (Signed)
Chronic in nature, EF 45-50% earlier this year No shortness of breath Chronic mild lower extremity edema controlled with Lasix 20 mg daily Euvolemic on exam Continue current medications

## 2018-04-30 NOTE — Assessment & Plan Note (Signed)
Check A1c Continue regular exercise Continue low sugar/carbohydrate diet

## 2018-04-30 NOTE — Assessment & Plan Note (Addendum)
Asymptomatic Rate controlled In sinus on exam today Following with cardiology CMP, CBC, TSH

## 2018-04-30 NOTE — Assessment & Plan Note (Signed)
BP well controlled Current regimen effective and well tolerated Continue current medications at current doses cmp  

## 2018-05-07 DIAGNOSIS — H903 Sensorineural hearing loss, bilateral: Secondary | ICD-10-CM | POA: Diagnosis not present

## 2018-05-16 ENCOUNTER — Ambulatory Visit (HOSPITAL_COMMUNITY)
Admission: RE | Admit: 2018-05-16 | Discharge: 2018-05-16 | Disposition: A | Discharge status: Home / Self Care | Payer: Medicare Other | Source: Ambulatory Visit | Attending: Nurse Practitioner | Admitting: Nurse Practitioner

## 2018-05-16 ENCOUNTER — Encounter (HOSPITAL_COMMUNITY): Payer: Self-pay | Admitting: Nurse Practitioner

## 2018-05-16 VITALS — BP 132/64 | HR 54 | Ht 72.0 in | Wt 192.0 lb

## 2018-05-16 DIAGNOSIS — I481 Persistent atrial fibrillation: Secondary | ICD-10-CM | POA: Insufficient documentation

## 2018-05-16 DIAGNOSIS — Z91013 Allergy to seafood: Secondary | ICD-10-CM | POA: Diagnosis not present

## 2018-05-16 DIAGNOSIS — Z800 Family history of malignant neoplasm of digestive organs: Secondary | ICD-10-CM | POA: Diagnosis not present

## 2018-05-16 DIAGNOSIS — I10 Essential (primary) hypertension: Secondary | ICD-10-CM | POA: Insufficient documentation

## 2018-05-16 DIAGNOSIS — Z833 Family history of diabetes mellitus: Secondary | ICD-10-CM | POA: Diagnosis not present

## 2018-05-16 DIAGNOSIS — Z85828 Personal history of other malignant neoplasm of skin: Secondary | ICD-10-CM | POA: Diagnosis not present

## 2018-05-16 DIAGNOSIS — I4819 Other persistent atrial fibrillation: Secondary | ICD-10-CM

## 2018-05-16 DIAGNOSIS — Z09 Encounter for follow-up examination after completed treatment for conditions other than malignant neoplasm: Secondary | ICD-10-CM | POA: Diagnosis not present

## 2018-05-16 DIAGNOSIS — I4510 Unspecified right bundle-branch block: Secondary | ICD-10-CM | POA: Diagnosis not present

## 2018-05-16 DIAGNOSIS — R001 Bradycardia, unspecified: Secondary | ICD-10-CM | POA: Diagnosis not present

## 2018-05-16 DIAGNOSIS — Z906 Acquired absence of other parts of urinary tract: Secondary | ICD-10-CM | POA: Diagnosis not present

## 2018-05-16 DIAGNOSIS — Z87891 Personal history of nicotine dependence: Secondary | ICD-10-CM | POA: Diagnosis not present

## 2018-05-16 DIAGNOSIS — Z9889 Other specified postprocedural states: Secondary | ICD-10-CM | POA: Diagnosis not present

## 2018-05-16 DIAGNOSIS — Z886 Allergy status to analgesic agent status: Secondary | ICD-10-CM | POA: Insufficient documentation

## 2018-05-16 DIAGNOSIS — Z7901 Long term (current) use of anticoagulants: Secondary | ICD-10-CM | POA: Diagnosis not present

## 2018-05-16 DIAGNOSIS — Z79899 Other long term (current) drug therapy: Secondary | ICD-10-CM | POA: Insufficient documentation

## 2018-05-16 DIAGNOSIS — Z8249 Family history of ischemic heart disease and other diseases of the circulatory system: Secondary | ICD-10-CM | POA: Diagnosis not present

## 2018-05-16 LAB — MAGNESIUM: Magnesium: 1.8 mg/dL (ref 1.7–2.4)
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
## 2018-05-17 ENCOUNTER — Ambulatory Visit (HOSPITAL_COMMUNITY)
Admission: RE | Admit: 2018-05-17 | Discharge: 2018-05-17 | Disposition: A | Discharge status: Home / Self Care | Payer: Medicare Other | Source: Ambulatory Visit | Attending: Nurse Practitioner | Admitting: Nurse Practitioner

## 2018-05-17 DIAGNOSIS — I493 Ventricular premature depolarization: Secondary | ICD-10-CM | POA: Diagnosis not present

## 2018-05-17 DIAGNOSIS — I481 Persistent atrial fibrillation: Secondary | ICD-10-CM

## 2018-05-17 DIAGNOSIS — E785 Hyperlipidemia, unspecified: Secondary | ICD-10-CM | POA: Diagnosis not present

## 2018-05-17 DIAGNOSIS — I10 Essential (primary) hypertension: Secondary | ICD-10-CM | POA: Diagnosis not present

## 2018-05-17 DIAGNOSIS — I4819 Other persistent atrial fibrillation: Secondary | ICD-10-CM

## 2018-05-17 NOTE — Progress Notes (Signed)
  Echocardiogram 2D Echocardiogram has been performed.  Sergio Mack 05/17/2018, 2:53 PM

## 2018-05-18 ENCOUNTER — Encounter (HOSPITAL_COMMUNITY): Payer: Self-pay | Admitting: Nurse Practitioner

## 2018-05-28 ENCOUNTER — Encounter (HOSPITAL_COMMUNITY): Payer: Self-pay | Admitting: Nurse Practitioner

## 2018-05-30 ENCOUNTER — Encounter (HOSPITAL_COMMUNITY): Payer: Self-pay | Admitting: Nurse Practitioner

## 2018-05-30 DIAGNOSIS — M542 Cervicalgia: Secondary | ICD-10-CM | POA: Diagnosis not present

## 2018-06-04 ENCOUNTER — Other Ambulatory Visit (HOSPITAL_COMMUNITY): Payer: Self-pay | Admitting: *Deleted

## 2018-06-04 DIAGNOSIS — I10 Essential (primary) hypertension: Secondary | ICD-10-CM

## 2018-06-04 MED ORDER — DOFETILIDE 500 MCG PO CAPS: 500.0000 ug | ORAL_CAPSULE | Freq: Two times a day (BID) | ORAL | 2 refills | Status: DC

## 2018-06-04 MED ORDER — METOPROLOL TARTRATE 100 MG PO TABS: 100.0000 mg | ORAL_TABLET | Freq: Two times a day (BID) | ORAL | 2 refills | Status: DC

## 2018-06-04 MED ORDER — FUROSEMIDE 20 MG PO TABS: 20.0000 mg | ORAL_TABLET | Freq: Every day | ORAL | 2 refills | Status: DC

## 2018-06-04 MED ORDER — APIXABAN 5 MG PO TABS: 5.0000 mg | ORAL_TABLET | Freq: Two times a day (BID) | ORAL | 3 refills | Status: DC

## 2018-06-04 MED ORDER — AMLODIPINE BESYLATE 5 MG PO TABS: 5.0000 mg | ORAL_TABLET | Freq: Every day | ORAL | 3 refills | Status: DC

## 2018-06-04 MED ORDER — QUINAPRIL HCL 40 MG PO TABS: 40.0000 mg | ORAL_TABLET | Freq: Every morning | ORAL | 3 refills | Status: DC

## 2018-06-06 ENCOUNTER — Ambulatory Visit (HOSPITAL_COMMUNITY): Payer: Medicare Other | Admitting: Nurse Practitioner

## 2018-06-24 ENCOUNTER — Encounter (HOSPITAL_COMMUNITY): Payer: Self-pay | Admitting: Emergency Medicine

## 2018-06-24 ENCOUNTER — Other Ambulatory Visit: Payer: Self-pay

## 2018-06-24 ENCOUNTER — Emergency Department (HOSPITAL_COMMUNITY): Payer: Medicare Other

## 2018-06-24 ENCOUNTER — Emergency Department (HOSPITAL_COMMUNITY)
Admission: EM | Admit: 2018-06-24 | Discharge: 2018-06-24 | Disposition: A | Discharge status: Home / Self Care | Payer: Medicare Other | Attending: Emergency Medicine | Admitting: Emergency Medicine

## 2018-06-24 DIAGNOSIS — R569 Unspecified convulsions: Secondary | ICD-10-CM | POA: Diagnosis not present

## 2018-06-24 DIAGNOSIS — I10 Essential (primary) hypertension: Secondary | ICD-10-CM | POA: Insufficient documentation

## 2018-06-24 DIAGNOSIS — Z79899 Other long term (current) drug therapy: Secondary | ICD-10-CM | POA: Diagnosis not present

## 2018-06-24 DIAGNOSIS — Z85828 Personal history of other malignant neoplasm of skin: Secondary | ICD-10-CM | POA: Diagnosis not present

## 2018-06-24 DIAGNOSIS — R918 Other nonspecific abnormal finding of lung field: Secondary | ICD-10-CM | POA: Diagnosis not present

## 2018-06-24 DIAGNOSIS — F1010 Alcohol abuse, uncomplicated: Secondary | ICD-10-CM | POA: Insufficient documentation

## 2018-06-24 DIAGNOSIS — E871 Hypo-osmolality and hyponatremia: Secondary | ICD-10-CM | POA: Insufficient documentation

## 2018-06-24 DIAGNOSIS — R0902 Hypoxemia: Secondary | ICD-10-CM | POA: Diagnosis not present

## 2018-06-24 DIAGNOSIS — Z87891 Personal history of nicotine dependence: Secondary | ICD-10-CM | POA: Diagnosis not present

## 2018-06-24 DIAGNOSIS — Z7901 Long term (current) use of anticoagulants: Secondary | ICD-10-CM | POA: Diagnosis not present

## 2018-06-24 DIAGNOSIS — R4020 Unspecified coma: Secondary | ICD-10-CM | POA: Diagnosis not present

## 2018-06-24 LAB — URINALYSIS, ROUTINE W REFLEX MICROSCOPIC
BACTERIA UA: NONE SEEN
Bilirubin Urine: NEGATIVE
Glucose, UA: 50 mg/dL — AB
Ketones, ur: 20 mg/dL — AB
Leukocytes, UA: NEGATIVE
Nitrite: NEGATIVE
Protein, ur: 30 mg/dL — AB
Specific Gravity, Urine: 1.01 (ref 1.005–1.030)
pH: 6 (ref 5.0–8.0)

## 2018-06-24 LAB — CBC WITH DIFFERENTIAL/PLATELET
Basophils Absolute: 0 10*3/uL (ref 0.0–0.1)
Basophils Relative: 0 %
Eosinophils Absolute: 0 10*3/uL (ref 0.0–0.7)
Eosinophils Relative: 0 %
HEMATOCRIT: 50.4 % (ref 39.0–52.0)
Hemoglobin: 16.8 g/dL (ref 13.0–17.0)
LYMPHS PCT: 24 %
Lymphs Abs: 3.4 10*3/uL (ref 0.7–4.0)
MCH: 31.1 pg (ref 26.0–34.0)
MCHC: 33.3 g/dL (ref 30.0–36.0)
MCV: 93.3 fL (ref 78.0–100.0)
Monocytes Absolute: 0.7 10*3/uL (ref 0.1–1.0)
Monocytes Relative: 5 %
Neutro Abs: 9.8 10*3/uL — ABNORMAL HIGH (ref 1.7–7.7)
Neutrophils Relative %: 71 %
Platelets: 246 10*3/uL (ref 150–400)
RBC: 5.4 MIL/uL (ref 4.22–5.81)
RDW: 13.9 % (ref 11.5–15.5)
WBC: 14 10*3/uL — ABNORMAL HIGH (ref 4.0–10.5)

## 2018-06-24 LAB — RAPID URINE DRUG SCREEN, HOSP PERFORMED
AMPHETAMINES: NOT DETECTED
BENZODIAZEPINES: NOT DETECTED
Barbiturates: NOT DETECTED
COCAINE: NOT DETECTED
OPIATES: NOT DETECTED
Tetrahydrocannabinol: NOT DETECTED

## 2018-06-24 LAB — COMPREHENSIVE METABOLIC PANEL
ALBUMIN: 4.3 g/dL (ref 3.5–5.0)
ALT: 34 U/L (ref 0–44)
AST: 65 U/L — ABNORMAL HIGH (ref 15–41)
Alkaline Phosphatase: 98 U/L (ref 38–126)
Anion gap: 26 — ABNORMAL HIGH (ref 5–15)
BUN: 12 mg/dL (ref 8–23)
CO2: 15 mmol/L — ABNORMAL LOW (ref 22–32)
CREATININE: 0.9 mg/dL (ref 0.61–1.24)
Calcium: 9 mg/dL (ref 8.9–10.3)
Chloride: 90 mmol/L — ABNORMAL LOW (ref 98–111)
GFR calc Af Amer: >60 mL/min (ref >60)
GFR calc non Af Amer: >60 mL/min (ref >60)
GLUCOSE: 212 mg/dL — AB (ref 70–99)
Potassium: 3.4 mmol/L — ABNORMAL LOW (ref 3.5–5.1)
Sodium: 131 mmol/L — ABNORMAL LOW (ref 135–145)
Total Bilirubin: 2.4 mg/dL — ABNORMAL HIGH (ref 0.3–1.2)
Total Protein: 7.8 g/dL (ref 6.5–8.1)

## 2018-06-24 LAB — TYPE AND SCREEN
ABO/RH(D): O POS
Antibody Screen: NEGATIVE

## 2018-06-24 LAB — I-STAT TROPONIN, ED: Troponin i, poc: 0.01 ng/mL (ref 0.00–0.08)

## 2018-06-24 LAB — ETHANOL: Alcohol, Ethyl (B): <10 mg/dL (ref <10)

## 2018-06-24 LAB — I-STAT CG4 LACTIC ACID, ED
Lactic Acid, Venous: 4.15 mmol/L (ref 0.5–1.9)
Lactic Acid, Venous: 1.29 mmol/L (ref 0.5–1.9)

## 2018-06-24 LAB — CK: Total CK: 117 U/L (ref 49–397)

## 2018-06-24 MED ORDER — LEVETIRACETAM IN NACL 1000 MG/100ML IV SOLN
1000.0000 mg | Freq: Once | INTRAVENOUS | Status: AC
  Administered 2018-06-24: 1000 mg via INTRAVENOUS
  Filled 2018-06-24: qty 100

## 2018-06-24 MED ORDER — LORAZEPAM 1 MG PO TABS: 1.0000 mg | ORAL_TABLET | Freq: Three times a day (TID) | ORAL | 0 refills | Status: DC | PRN

## 2018-06-24 MED ORDER — ACETAMINOPHEN 325 MG PO TABS
650.0000 mg | ORAL_TABLET | Freq: Once | ORAL | Status: AC
  Administered 2018-06-24: 650 mg via ORAL
  Filled 2018-06-24: qty 2

## 2018-06-24 MED ORDER — LEVETIRACETAM 500 MG PO TABS: 500.0000 mg | ORAL_TABLET | Freq: Two times a day (BID) | ORAL | 0 refills | Status: DC

## 2018-06-24 NOTE — ED Triage Notes (Signed)
Pt BIB EMS from home. EMS called out to house by wife for patient having a period of unresponsiveness for 10 minutes and was pale, cool and diaphoretic. Upon arrival, patient was A&O x4, pink, warm and dry. About 1 minute PTA, EMS states patient became unresponsive, with seizure like activity and posturing. Patient currently obtunded, barely responsive to pain. Patient in Afib with RBBB per EMS.

## 2018-06-24 NOTE — ED Notes (Signed)
Urine culture sent down with UA. 

## 2018-06-24 NOTE — ED Provider Notes (Signed)
Asked to reevaluate patient seen for seizures due to family concern that patient was not back to normal.  Family now thinks patient at baseline.  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  He is awake and alert. Discussed with wife and she now feels comfortable taking him home. Requests Tylenol due to some muscle aches are thought to be due to his seizures Plan Ativan taper and patient to follow-up with primary care doctor this week.  I discussed with wife that she should control the Ativan and he cannot drink alcohol while taking this.  He is on thinners.  We have discussed return precautions specifically increased confusion, headache, or lateralized weakness.   Pattricia Boss, MD 06/24/18 [phone]

## 2018-06-24 NOTE — ED Notes (Signed)
Pt. Is in no distress. His wife tells me she is very reticent to take pt. Home, preferring he be admitted. I spoke with Dr. Jeanell Sparrow about this and she states she will see him when she is able.

## 2018-06-24 NOTE — ED Notes (Signed)
He is awake, alert and oriented to all except date. We reposition him at this time; also he is assisted in standing to void, which he attempts, but is unable at this time.

## 2018-06-24 NOTE — ED Provider Notes (Signed)
[facility] DEPT Provider Note   CSN: [id number] Arrival date & time: 06/24/18  0330     History   Chief Complaint Chief Complaint  Patient presents with  . Seizures  . Altered Mental Status    HPI Sergio Mack is a 72 y.o. male.  The history is provided by the EMS personnel. The history is limited by the condition of the patient (Unresponsive).  He has history of hypertension, hyperlipidemia, heart failure, atrial fibrillation, anticoagulated on apixaban and is brought in by ambulance because of an episode of unresponsiveness at home.  His wife reported to EMS that patient was unresponsive for about 10 minutes.  EMS reports that when they arrived, he was awake and alert and conversant.  While in route to the hospital, he became rigid and did have some shaking and became unresponsive.  He was unresponsive on arrival to the emergency department.  He did have some blood coming from the right side of his mouth with suspicion for a bit tongue.  Past Medical History:  Diagnosis Date  . Diverticulosis 2006  . Erectile dysfunction   . GERD (gastroesophageal reflux disease)   . History of skin cancer   . History of syncope 1994   no etiology; no recurrence  . Hyperlipidemia    LDL goal = < 100, ideally < 70  . Hypertension   . Persistent atrial fibrillation ([place]) 03/06/2014  . PVC's (premature ventricular contractions)   . Rotator cuff tear   . Visit for monitoring Tikosyn therapy 12/19/2017    Patient Active Problem List   Diagnosis Date Noted  . Head trauma 04/20/2018  . Abrasion 04/20/2018  . Rib pain on left side 04/20/2018  . Persistent atrial fibrillation ([place]) 12/19/2017  . HFrEF (heart failure with reduced ejection fraction) ([place]) 12/04/2017  . Memory difficulties 10/26/2017  . Cramping of feet 04/24/2017  . Headache 04/24/2017  . 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 Sensorineural hearing loss (SNHL), 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  .  Prediabetes 04/07/2016  . Full thickness rotator cuff tear 04/23/2014  . Anticoagulation adequate, new on eliquis 03/07/2014  . SKIN CANCER, HX OF 03/24/2010  . DIVERTICULOSIS, COLON 12/11/2008  . SPINAL STENOSIS 01/14/2008  . BACK PAIN, LUMBAR, WITH RADICULOPATHY 01/02/2008  . Essential hypertension 10/31/2007  . 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 GERD 09/27/2007  . Localized swelling, mass, and lump of head 09/27/2007    Past Surgical History:  Procedure Laterality Date  . CARDIOVERSION N/A 12/05/2017   Procedure: CARDIOVERSION;  Surgeon: Dorothy Spark, MD;  Location: [facility] ENDOSCOPY;  Service: Cardiovascular;  Laterality: N/A;  . COLONOSCOPY  2006   Diverticulosis  . CYSTECTOMY     lip  . CYSTECTOMY     anterior thorax-chest  . INGUINAL HERNIA REPAIR      x 2  . 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 SHOULDER OPEN ROTATOR CUFF REPAIR Right 04/23/2014   Procedure: RIGHT SHOULDER ROTATOR CUFF REPAIR WITH GRAFT AND ANCHORS . OPEN ACROMIONECTOMY;  Surgeon: Tobi Bastos, MD;  Location: [facility] ORS;  Service: Orthopedics;  Laterality: Right;  . SPINE SURGERY  2012   Dr Gladstone Lighter  . TEE WITHOUT CARDIOVERSION N/A 12/05/2017   Procedure: TRANSESOPHAGEAL ECHOCARDIOGRAM (TEE);  Surgeon: Dorothy Spark, MD;  Location: [facility];  Service: Cardiovascular;  Laterality: N/A;  .  TONSILLECTOMY AND ADENOIDECTOMY          Home Medications    Prior to Admission medications   Medication Sig Start Date End Date Taking? Authorizing Provider  amLODipine (NORVASC) 5 MG tablet Take 1 tablet (5 mg total) by mouth daily. 06/04/18 06/04/19  Sherran Needs, NP  apixaban (ELIQUIS) 5 MG TABS tablet Take 1 tablet (5 mg total) by mouth 2 (two) times daily. 06/04/18   Sherran Needs, NP  cholecalciferol (VITAMIN D) 1000  units tablet Take 1,000 Units by mouth daily.    [provider]  Cyanocobalamin (VITAMIN B-12) 5000 MCG SUBL Place 5,000 mcg under the tongue daily.    [provider]  dofetilide (TIKOSYN) 500 MCG capsule Take 1 capsule (500 mcg total) by mouth 2 (two) times daily. 06/04/18   Sherran Needs, NP  furosemide (LASIX) 20 MG tablet Take 1 tablet (20 mg total) by mouth daily. 06/04/18   Sherran Needs, NP  metoprolol tartrate (LOPRESSOR) 100 MG tablet Take 1 tablet (100 mg total) by mouth 2 (two) times daily. 06/04/18   Sherran Needs, NP  Multiple Vitamins-Minerals (ICAPS AREDS 2 PO) Take 1 capsule by mouth 2 (two) times daily.    [provider]  mupirocin ointment (BACTROBAN) 2 % Place 1 application into the nose 2 (two) times daily. 04/20/18   Binnie Rail, MD  quinapril (ACCUPRIL) 40 MG tablet Take 1 tablet (40 mg total) by mouth every morning. 06/04/18   Sherran Needs, NP  sodium chloride (OCEAN) 0.65 % SOLN nasal spray Place 1 spray into both nostrils as needed for congestion.    [provider]  Zinc 50 MG CAPS Take 50 mg by mouth every 3 (three) days.     [provider]    Family History Family History  Problem Relation Age of Onset  . Heart attack Father 68  . Diabetes Mother   . Transient ischemic attack Mother 34  . Diabetes Sister   . Heart attack Sister 39  . Stomach cancer Maternal Grandfather   . Cirrhosis Paternal Uncle        alcoholic  . Colon cancer Neg Hx   . Colon polyps Neg Hx     Social History Social History   Tobacco Use  . Smoking status: Former Smoker    Last attempt to quit: 04/26/1990    Years since quitting: 28.1  . Smokeless tobacco: Former Systems developer  . Tobacco comment: smoked [place] with 12 year abstinence ( 21 total years of smoking), up to 1 ppd  Substance Use Topics  . Alcohol use: No    Alcohol/week: 0.0 oz  . Drug use: No     Allergies   Glucosamine and Shellfish-derived products   Review  of Systems Review of Systems  Unable to perform ROS: Patient unresponsive     Physical Exam Updated Vital Signs BP 134/75   Pulse 78   Resp (!) 21   SpO2 96%   Physical Exam  Nursing note and vitals reviewed.  72 year old male, resting comfortably and in no acute distress. Vital signs are significant for borderline tachypnea. Oxygen saturation is 96%, which is normal. Head is normocephalic and atraumatic.  Pupils are 5 mm and reactive.  Corneal reflexes intact. Oropharynx is clear.  Small amount of blood dripping from the corner of the right side of his mouth, no clear intraoral injury but no significant amount of intraoral blood.  Gag reflex is intact.  Neck is nontender and supple without adenopathy or JVD. Back is nontender and there is no CVA tenderness. Lungs have coarse breath sounds throughout without overt rales or wheezes or rhonchi. Chest is nontender. Heart has regular rate and rhythm without murmur. Abdomen is soft, flat, nontender without masses or hepatosplenomegaly and peristalsis is normoactive. Extremities have no cyanosis or edema, full range of motion is present. Skin is warm and dry without rash. Neurologic: Unresponsive to voice and painful stimuli.  Corneal reflex and gag reflex is intact.  Currently showing no voluntary movement of arms or legs.  No Babinski reflex.  ED Treatments / Results  Labs (all labs ordered are listed, but only abnormal results are displayed) Labs Reviewed  COMPREHENSIVE METABOLIC PANEL - Abnormal; Notable for the following components:      Result Value   Sodium 131 (*)    Potassium 3.4 (*)    Chloride 90 (*)    CO2 15 (*)    Glucose, Bld 212 (*)    AST 65 (*)    Total Bilirubin 2.4 (*)    Anion gap 26 (*)    All other components within normal limits  CBC WITH DIFFERENTIAL/PLATELET - Abnormal; Notable for the following components:   WBC 14.0 (*)    Neutro Abs 9.8 (*)    All other components within normal limits  URINALYSIS,  ROUTINE W REFLEX MICROSCOPIC - Abnormal; Notable for the following components:   Glucose, UA 50 (*)    Hgb urine dipstick SMALL (*)    Ketones, ur 20 (*)    Protein, ur 30 (*)    All other components within normal limits  I-STAT CG4 LACTIC ACID, ED - Abnormal; Notable for the following components:   Lactic Acid, Venous >17.00 (*)    All other components within normal limits  I-STAT CG4 LACTIC ACID, ED - Abnormal; Notable for the following components:   Lactic Acid, Venous 4.15 (*)    All other components within normal limits  ETHANOL  RAPID URINE DRUG SCREEN, HOSP PERFORMED  CK  I-STAT TROPONIN, ED  I-STAT CG4 LACTIC ACID, ED  TYPE AND SCREEN    EKG EKG Interpretation  Date/Time:  Sunday June 24 2018 03:36:07 EDT Ventricular Rate:  94 PR Interval:    QRS Duration: 140 QT Interval:  401 QTC Calculation: 502 R Axis:   47 Text Interpretation:  Sinus tachycardia Multiple premature complexes, vent & supraven Right bundle branch block When compared with ECG of 05/16/2018, Premature ventricular complexes are now present Premature atrial complexes are now present Confirmed by Delora Fuel ([id number]) on 06/24/2018 3:46:48 AM   Radiology Ct Head Wo Contrast  Result Date: 06/24/2018 CLINICAL DATA:  72 y/o M; patient thought apnea. Episode of seizure. EXAM: CT HEAD WITHOUT CONTRAST TECHNIQUE: Contiguous axial images were obtained from the base of the skull through the vertex without intravenous contrast. COMPARISON:  04/20/2018 CT head. FINDINGS: Brain: No evidence of acute infarction, hemorrhage, hydrocephalus, extra-axial collection or mass lesion/mass effect. Stable moderate chronic microvascular ischemic changes and volume loss of the brain. Vascular: Mild calcific atherosclerosis of carotid siphons. No hyperdense vessel identified. Skull: Normal. Negative for fracture or focal lesion. Sinuses/Orbits: No acute finding. Other: None. IMPRESSION: 1. No acute intracranial abnormality identified.  2. Stable moderate chronic microvascular ischemic changes and volume loss of the brain. Electronically Signed   By: Kristine Garbe M.D.   On: 06/24/2018 04:24   Dg Chest Port 1 View  Result Date: 06/24/2018 CLINICAL DATA:  Altered  mental status. EXAM: PORTABLE CHEST 1 VIEW COMPARISON:  Chest radiograph and CT 12/03/2017 FINDINGS: Low lung volumes. Mild cardiomegaly. Streaky left lung base opacity may be atelectasis or scarring. No large pleural effusion, confluent airspace disease or pneumothorax. No acute osseous abnormalities are seen. IMPRESSION: Low lung volumes with cardiomegaly and streaky left lung base opacity, atelectasis or scarring. Electronically Signed   By: Jeb Levering M.D.   On: 06/24/2018 04:00    Procedures Procedures  CRITICAL CARE Performed by: Delora Fuel Total critical care time: 35 minutes Critical care time was exclusive of separately billable procedures and treating other patients. Critical care was necessary to treat or prevent imminent or life-threatening deterioration. Critical care was time spent personally by me on the following activities: development of treatment plan with patient and/or surrogate as well as nursing, discussions with consultants, evaluation of patient's response to treatment, examination of patient, obtaining history from patient or surrogate, ordering and performing treatments and interventions, ordering and review of laboratory studies, ordering and review of radiographic studies, pulse oximetry and re-evaluation of patient's condition.  Medications Ordered in ED Medications  levETIRAcetam (KEPPRA) IVPB 1000 mg/100 mL premix (has no administration in time range)     Initial Impression / Assessment and Plan / ED Course  I have reviewed the triage vital signs and the nursing notes.  Pertinent labs & imaging results that were available during my care of the patient were reviewed by me and considered in my medical decision making  (see chart for details).  2 episodes of unresponsiveness, second which was clearly preceded by seizure activity.  I suspect that what I am seeing now is postictal state.  He is given dose of levetiracetam and will be sent for CT of head.  Old records are reviewed confirming chronic anticoagulation for atrial fibrillation.  This would clearly put him at risk for intracranial hemorrhage.  Currently, he is protecting his airway adequately and does not need intubation.  4:19 AM Family has arrived.  Wife describes seeing generalized shaking prior to his episode of unresponsiveness.  She is unsure if he had incontinence since he was in the bathroom when the episode occurred.  He is now awake and alert and oriented to person and place but not time.  Family does relate that he has not quite seem like himself for the last several days.  CT of the head shows no evidence of bleeding per my reading, radiologist interpretation pending.  Lactic acid level is elevated consistent with recent seizure.  This is not felt to represent sepsis.  Metabolic panel is pending.  6:16 AM Initial laboratory testing did show markedly elevated lactic acid level, consistent with seizure.  CK was normal.  Repeat lactic acid level had come down dramatically without any IV hydration.  Metabolic panel showed metabolic acidosis which I believe was the lactic acidosis from his seizure.  This does not require additional investigation.  Radiologist interpretation of head CT agrees that there is no intracranial bleeding, and notes stable chronic microvascular ischemic changes.  Family states that he had been an alcoholic but had been sober for over 30 years before resuming drinking 1 month ago.  He had his last drink approximately 36 hours ago, so this may have been an alcohol withdrawal seizure.  He is discharged with prescription for levetiracetam and is referred to neurology for outpatient EEG.  Final Clinical Impressions(s) / ED Diagnoses    Final diagnoses:  Seizure ([place])  Hyponatremia  Chronic anticoagulation  Alcohol abuse  ED Discharge Orders        Ordered    levETIRAcetam (KEPPRA) 500 MG tablet  2 times daily     06/24/18 0612    Ambulatory referral to Neurology    Comments:  An appointment is requested in approximately: 1 week   08/10/79 2217       Delora Fuel, MD 98/10/25 [phone]

## 2018-06-24 NOTE — Discharge Instructions (Signed)
You had a seizure today.  [place] law states that you may not drive until you have gone 6 months without a seizure.  The seizure may have been related to your drinking alcohol.  Do not have any alcohol, or a could help bring on another seizure.  Please make a follow-up appointment with the neurologist to have additional testing.  They will probably want to do a brainwave study (EEG), and possibly an MRI of your brain.

## 2018-06-24 NOTE — ED Notes (Signed)
Bed: OV56 Expected date:  Expected time:  Means of arrival:  Comments: Resus B

## 2018-06-24 NOTE — ED Notes (Signed)
Lactic per mini-lab 4.15

## 2018-06-25 ENCOUNTER — Other Ambulatory Visit (INDEPENDENT_AMBULATORY_CARE_PROVIDER_SITE_OTHER): Payer: Medicare Other

## 2018-06-25 ENCOUNTER — Encounter: Payer: Self-pay | Admitting: Internal Medicine

## 2018-06-25 ENCOUNTER — Ambulatory Visit (INDEPENDENT_AMBULATORY_CARE_PROVIDER_SITE_OTHER): Payer: Medicare Other | Admitting: Internal Medicine

## 2018-06-25 VITALS — BP 130/64 | HR 67 | Temp 98.1°F | Resp 16 | Wt 183.0 lb

## 2018-06-25 DIAGNOSIS — I10 Essential (primary) hypertension: Secondary | ICD-10-CM | POA: Diagnosis not present

## 2018-06-25 DIAGNOSIS — E876 Hypokalemia: Secondary | ICD-10-CM

## 2018-06-25 DIAGNOSIS — F1010 Alcohol abuse, uncomplicated: Secondary | ICD-10-CM | POA: Diagnosis not present

## 2018-06-25 DIAGNOSIS — D72829 Elevated white blood cell count, unspecified: Secondary | ICD-10-CM

## 2018-06-25 DIAGNOSIS — F1011 Alcohol abuse, in remission: Secondary | ICD-10-CM | POA: Insufficient documentation

## 2018-06-25 DIAGNOSIS — R569 Unspecified convulsions: Secondary | ICD-10-CM

## 2018-06-25 LAB — CBC WITH DIFFERENTIAL/PLATELET
BASOS ABS: 0.1 10*3/uL (ref 0.0–0.1)
Basophils Relative: 0.9 % (ref 0.0–3.0)
EOS ABS: 0.1 10*3/uL (ref 0.0–0.7)
Eosinophils Relative: 0.6 % (ref 0.0–5.0)
HCT: 44.2 % (ref 39.0–52.0)
Hemoglobin: 15 g/dL (ref 13.0–17.0)
LYMPHS PCT: 19.2 % (ref 12.0–46.0)
Lymphs Abs: 2 10*3/uL (ref 0.7–4.0)
MCHC: 34 g/dL (ref 30.0–36.0)
MCV: 91.6 fl (ref 78.0–100.0)
MONOS PCT: 10.7 % (ref 3.0–12.0)
Monocytes Absolute: 1.1 10*3/uL — ABNORMAL HIGH (ref 0.1–1.0)
NEUTROS ABS: 7.1 10*3/uL (ref 1.4–7.7)
Neutrophils Relative %: 68.6 % (ref 43.0–77.0)
PLATELETS: 234 10*3/uL (ref 150.0–400.0)
RBC: 4.82 Mil/uL (ref 4.22–5.81)
RDW: 14.4 % (ref 11.5–15.5)
WBC: 10.4 10*3/uL (ref 4.0–10.5)

## 2018-06-25 LAB — COMPREHENSIVE METABOLIC PANEL
ALK PHOS: 89 U/L (ref 39–117)
ALT: 25 U/L (ref 0–53)
AST: 34 U/L (ref 0–37)
Albumin: 4 g/dL (ref 3.5–5.2)
BILIRUBIN TOTAL: 0.8 mg/dL (ref 0.2–1.2)
BUN: 10 mg/dL (ref 6–23)
CALCIUM: 9.4 mg/dL (ref 8.4–10.5)
CO2: 30 meq/L (ref 19–32)
CREATININE: 0.9 mg/dL (ref 0.40–1.50)
Chloride: 98 mEq/L (ref 96–112)
GFR: 87.96 mL/min (ref >60.00)
Glucose, Bld: 115 mg/dL — ABNORMAL HIGH (ref 70–99)
Potassium: 4 mEq/L (ref 3.5–5.1)
Sodium: 135 mEq/L (ref 135–145)
TOTAL PROTEIN: 6.9 g/dL (ref 6.0–8.3)

## 2018-06-25 MED ORDER — LEVETIRACETAM 500 MG PO TABS: 500.0000 mg | ORAL_TABLET | Freq: Two times a day (BID) | ORAL | 0 refills | Status: DC

## 2018-06-25 NOTE — Assessment & Plan Note (Signed)
New onset ? Cause - alcohol likely plays a role, but wife is concerned they may be another cause of something else going on Ct of head showed no acute finding Sees neuro tomorrow Not taking keppra - did not get the prescription from the ED rx sent to pharmacy Stressed alcohol abstinence

## 2018-06-25 NOTE — Assessment & Plan Note (Signed)
BP well controlled Current regimen effective and well tolerated Continue current medications at current doses cmp  

## 2018-06-25 NOTE — Assessment & Plan Note (Signed)
Was sober for over 35 years and started drinking 1 month ago Not sure if he wants to quit Likely contributing to new seizure onset Stressed alcohol abstinence Recommended counseling/psychiatry-he deferred

## 2018-06-25 NOTE — Patient Instructions (Addendum)
  Test(s) ordered today. Your results will be released to MyChart (or called to you) after review, usually within 72hours after test completion. If any changes need to be made, you will be notified at that same time.    Medications reviewed and updated.  No changes recommended at this time.  Your prescription(s) have been submitted to your pharmacy. Please take as directed and contact our office if you believe you are having problem(s) with the medication(s).     

## 2018-06-25 NOTE — Assessment & Plan Note (Signed)
Likely reactive in nature CBC

## 2018-06-25 NOTE — Progress Notes (Signed)
Subjective:    Patient ID: Sergio Mack, male    DOB: 1945/12/07, 72 y.o.   MRN: [id number]  HPI The patient is here for follow up from the emergency room.  He went to the emergency room yesterday.  He went to the bathroom and his wife heard of groan.  He was sweating profusely and she was unable to wake him up.  He then started shaking.  She called 911 and when EMS got there they were not able to wake him up.  When he did come to he was very confused.  He had another seizure in the ambulance.  He was unresponsive on arrival to the emergency room.  He did have blood coming from the right side of his mouth with suspicion of biting the tongue.  EKG showed sinus tachycardia with multiple premature complexes-ventricular and supraventricular, right bundle branch block.  CT of the head showed no acute intracranial abnormality.  Stable, moderate chronic microvascular ischemic changes and volume loss of brain.  Chest x-ray showed no acute disease. Blood work showed a slightly low potassium, elevated lactic acid and elevated WBC.  CK and troponin normal.  UA did not show an infection. Urine tox was negative.  He was loaded with Keppra and sent home.  He is here with his wife today.  They were able to get into see neurology at 930 tomorrow morning.  His wife states that she felt he was confused even the week prior to this happening.  He would repeat himself and ask the same question.  Up until one month ago he had 35 years of sobriety - one month ago he started drinking again.  Last Wednesday and Thursday he drank alcohol - 6 shots.   He also drank Friday -  6 shots.  He did not drink Saturday.  Sunday he went to the ED.   He has drank since then and is not sure if he wants to quit.    Medications and allergies reviewed with patient and updated if appropriate.  Patient Active Problem List   Diagnosis Date Noted  . Head trauma 04/20/2018  . Abrasion 04/20/2018  . Rib pain on left side 04/20/2018  .  Persistent atrial fibrillation ([place]) 12/19/2017  . HFrEF (heart failure with reduced ejection fraction) ([place]) 12/04/2017  . Memory difficulties 10/26/2017  . Cramping of feet 04/24/2017  . Headache 04/24/2017  . 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 Sensorineural hearing loss (SNHL), 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  . Prediabetes 04/07/2016  . Full thickness rotator cuff tear 04/23/2014  . Anticoagulation adequate, new on eliquis 03/07/2014  . SKIN CANCER, HX OF 03/24/2010  . DIVERTICULOSIS, COLON 12/11/2008  . SPINAL STENOSIS 01/14/2008  . BACK PAIN, LUMBAR, WITH RADICULOPATHY 01/02/2008  . Essential hypertension 10/31/2007  . 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 GERD 09/27/2007  . Localized swelling, mass, and lump of head 09/27/2007    Current Outpatient Medications on File Prior to Visit  Medication Sig Dispense Refill  . amLODipine (NORVASC) 5 MG tablet Take 1 tablet (5 mg total) by mouth daily. 90 tablet 3  . apixaban (ELIQUIS) 5 MG TABS tablet Take 1 tablet (5 mg total) by mouth 2 (two) times daily. 180 tablet 3  . cholecalciferol (VITAMIN D) 1000 units tablet Take 1,000 Units by mouth daily.    . Cyanocobalamin (VITAMIN B-12) 5000 MCG SUBL Place 5,000 mcg under the tongue daily.    Marland Kitchen dofetilide (TIKOSYN) 500 MCG capsule  Take 1 capsule (500 mcg total) by mouth 2 (two) times daily. 180 capsule 2  . furosemide (LASIX) 20 MG tablet Take 1 tablet (20 mg total) by mouth daily. 90 tablet 2  . levETIRAcetam (KEPPRA) 500 MG tablet Take 1 tablet (500 mg total) by mouth 2 (two) times daily. 60 tablet 0  . LORazepam (ATIVAN) 1 MG tablet Take 1 tablet (1 mg total) by mouth 3 (three) times daily as needed for anxiety (3 times daily for 2 days, 2 times daily for 2 days, one time daily for 2 days). 15 tablet 0  . metoprolol tartrate (LOPRESSOR) 100 MG tablet Take 1 tablet (100 mg total) by mouth 2 (two) times daily. 180 tablet 2  . Multiple Vitamins-Minerals (ICAPS AREDS 2  PO) Take 1 capsule by mouth 2 (two) times daily.    . mupirocin ointment (BACTROBAN) 2 % Place 1 application into the nose 2 (two) times daily. 22 g 0  . quinapril (ACCUPRIL) 40 MG tablet Take 1 tablet (40 mg total) by mouth every morning. 90 tablet 3  . sodium chloride (OCEAN) 0.65 % SOLN nasal spray Place 1 spray into both nostrils as needed for congestion.    . Zinc 50 MG CAPS Take 50 mg by mouth every 3 (three) days.      No current facility-administered medications on file prior to visit.     Past Medical History:  Diagnosis Date  . Diverticulosis 2006  . Erectile dysfunction   . GERD (gastroesophageal reflux disease)   . History of skin cancer   . History of syncope 1994   no etiology; no recurrence  . Hyperlipidemia    LDL goal = < 100, ideally < 70  . Hypertension   . Persistent atrial fibrillation ([place]) 03/06/2014  . PVC's (premature ventricular contractions)   . Rotator cuff tear   . Visit for monitoring Tikosyn therapy 12/19/2017    Past Surgical History:  Procedure Laterality Date  . CARDIOVERSION N/A 12/05/2017   Procedure: CARDIOVERSION;  Surgeon: Dorothy Spark, MD;  Location[facility] ENDOSCOPY;  Service: Cardiovascular;  Laterality: N/A;  . COLONOSCOPY  2006   Diverticulosis  . CYSTECTOMY     lip  . CYSTECTOMY     anterior thorax-chest  . INGUINAL HERNIA REPAIR      x 2  . 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 SHOULDER OPEN ROTATOR CUFF REPAIR Right 04/23/2014   Procedure: RIGHT SHOULDER ROTATOR CUFF REPAIR WITH GRAFT AND ANCHORS . OPEN ACROMIONECTOMY;  Surgeon: Tobi Bastos, MD;  Location: [facility] ORS;  Service: Orthopedics;  Laterality: Right;  . SPINE SURGERY  2012   Dr Gladstone Lighter  . TEE WITHOUT CARDIOVERSION N/A 12/05/2017   Procedure: TRANSESOPHAGEAL ECHOCARDIOGRAM (TEE);  Surgeon: Dorothy Spark, MD;   Location: [place];  Service: Cardiovascular;  Laterality: N/A;  . TONSILLECTOMY AND ADENOIDECTOMY      Social History   Socioeconomic History  . Marital status: Married    Spouse name: Not on file  . Number of children: Not on file  . Years of education: Not on file  . Highest education level: Not on file  Occupational History  . Not on file  Social Needs  . Financial resource strain: Not on file  . Food insecurity:    Worry: Not on file    Inability: Not on file  .  Transportation needs:    Medical: Not on file    Non-medical: Not on file  Tobacco Use  . Smoking status: Former Smoker    Last attempt to quit: 04/26/1990    Years since quitting: 28.1  . Smokeless tobacco: Former Systems developer  . Tobacco comment: smoked [place] with 12 year abstinence ( 21 total years of smoking), up to 1 ppd  Substance and Sexual Activity  . Alcohol use: No    Alcohol/week: 0.0 oz  . Drug use: No  . Sexual activity: Not on file  Lifestyle  . Physical activity:    Days per week: Not on file    Minutes per session: Not on file  . Stress: Not on file  Relationships  . Social connections:    Talks on phone: Not on file    Gets together: Not on file    Attends religious service: Not on file    Active member of club or organization: Not on file    Attends meetings of clubs or organizations: Not on file    Relationship status: Not on file  Other Topics Concern  . Not on file  Social History Narrative   No regular exercise    Family History  Problem Relation Age of Onset  . Heart attack Father 33  . Diabetes Mother   . Transient ischemic attack Mother 88  . Diabetes Sister   . Heart attack Sister 93  . Stomach cancer Maternal Grandfather   . Cirrhosis Paternal Uncle        alcoholic  . Colon cancer Neg Hx   . Colon polyps Neg Hx     Review of Systems  Constitutional: Negative for chills and fever.  Respiratory: Positive for cough. Negative for shortness of breath and wheezing.     Cardiovascular: Negative for chest pain, palpitations and leg swelling.  Genitourinary: Positive for dysuria. Negative for difficulty urinating and hematuria.  Musculoskeletal: Positive for gait problem and myalgias.  Neurological: Positive for dizziness. Negative for numbness and headaches.       Objective:   Vitals:   06/25/18 1553  BP: 130/64  Pulse: 67  Resp: 16  Temp: 98.1 F (36.7 C)  SpO2: 95%   BP Readings from Last 3 Encounters:  06/25/18 130/64  06/24/18 128/74  05/16/18 132/64   Wt Readings from Last 3 Encounters:  06/25/18 183 lb (83 kg)  05/16/18 192 lb (87.1 kg)  04/30/18 192 lb (87.1 kg)   Body mass index is 24.82 kg/m.   Physical Exam    Constitutional: Appears well-developed and well-nourished. No distress.  HENT:  Head: Normocephalic and atraumatic.  Neck: Neck supple. No tracheal deviation present. No thyromegaly present.  No cervical lymphadenopathy Cardiovascular: Normal rate, regular rhythm and normal heart sounds.   No murmur heard. No carotid bruit .  No edema Pulmonary/Chest: Effort normal and breath sounds normal. No respiratory distress. No has no wheezes. No rales.  Skin: Skin is warm and dry. Not diaphoretic.  Psychiatric: depressed mood/ affect or apathetic appearing. Behavior is normal.      Assessment & Plan:    See Problem List for Assessment and Plan of chronic medical problems.

## 2018-06-25 NOTE — Assessment & Plan Note (Signed)
cmp

## 2018-06-26 ENCOUNTER — Encounter: Payer: Self-pay | Admitting: Internal Medicine

## 2018-06-26 ENCOUNTER — Telehealth: Payer: Self-pay | Admitting: Neurology

## 2018-06-26 ENCOUNTER — Ambulatory Visit (INDEPENDENT_AMBULATORY_CARE_PROVIDER_SITE_OTHER): Payer: Medicare Other | Admitting: Neurology

## 2018-06-26 ENCOUNTER — Encounter: Payer: Self-pay | Admitting: Neurology

## 2018-06-26 VITALS — BP 139/84 | HR 53 | Ht 73.0 in | Wt 185.0 lb

## 2018-06-26 DIAGNOSIS — F1020 Alcohol dependence, uncomplicated: Secondary | ICD-10-CM

## 2018-06-26 DIAGNOSIS — F10230 Alcohol dependence with withdrawal, uncomplicated: Secondary | ICD-10-CM

## 2018-06-26 DIAGNOSIS — F10239 Alcohol dependence with withdrawal, unspecified: Secondary | ICD-10-CM | POA: Insufficient documentation

## 2018-06-26 DIAGNOSIS — Z79899 Other long term (current) drug therapy: Secondary | ICD-10-CM | POA: Diagnosis not present

## 2018-06-26 DIAGNOSIS — R569 Unspecified convulsions: Secondary | ICD-10-CM

## 2018-06-26 DIAGNOSIS — R413 Other amnesia: Secondary | ICD-10-CM

## 2018-06-26 DIAGNOSIS — F10930 Alcohol use, unspecified with withdrawal, uncomplicated: Secondary | ICD-10-CM

## 2018-06-26 DIAGNOSIS — E538 Deficiency of other specified B group vitamins: Secondary | ICD-10-CM

## 2018-06-26 DIAGNOSIS — F10939 Alcohol use, unspecified with withdrawal, unspecified: Secondary | ICD-10-CM | POA: Insufficient documentation

## 2018-06-26 NOTE — Progress Notes (Signed)
[facility]    Provider:  Dr Jaynee Eagles Referring Provider: Binnie Rail, MD Primary Care Physician:  Binnie Rail, MD  CC:  seizure  HPI:  Sergio Mack is a 72 y.o. male here as a referral from Dr. Quay Burow for seizure. 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   Wife provides most information. Got up at at 2am and he yelled, he was breathing gutterly he was sweating profusely, eyes closed and would not respond, his whole body was shaking like chills, he doesn;t remember anything. Wife called 911 thought he was having a heart attack, 15 minutes unresponsive and laying on the bed, eyes closed, no response at all, EMS came EKG was fine and blood pressure was fine, he went to the ED, per notes with EMG became unresponsive with seizure-like activity and posturing. No new meds, recent illnesses, no iiciting events.  He is a recovered alcoholic and started drinking a month ago, drinking "a lot" per wife.  A pint some days. He had stopped drinking 36 hours prior and was drinking heavily the last week. Also decreased memory even before this. Memory worsening since the seizures. He continues to drink alcohol.   Reviewed notes, labs and imaging from outside physicians, which showed:  CT head  showed No acute intracranial abnormalities including mass lesion or mass effect, hydrocephalus, extra-axial fluid collection, midline shift, hemorrhage, or acute infarction, large ischemic events (personally reviewed images)  Cbc/cmp unremarkable   Review of Systems: Patient complains of symptoms per HPI as well as the following symptoms: memory loss, confusion, seizure. Pertinent negatives and positives per HPI. All others negative.   Social History   Socioeconomic History  . Marital status: Married    Spouse name: Not on file  . Number of children: 1  . Years of education: Not on file  . Highest education level:  High school graduate  Occupational History  . Not on file  Social Needs  . Financial resource strain: Not on file  . Food insecurity:    Worry: Not on file    Inability: Not on file  . Transportation needs:    Medical: Not on file    Non-medical: Not on file  Tobacco Use  . Smoking status: Former Smoker    Packs/day: 1.00    Types: Cigarettes    Last attempt to quit: 1990    Years since quitting: 29.6  . Smokeless tobacco: Former Systems developer  . Tobacco comment: smoked [place] with 12 year abstinence ( 21 total years of smoking), up to 1 ppd  Substance and Sexual Activity  . Alcohol use: Yes    Comment: 4 shots of alcohol a few days a week  . Drug use: Never  . Sexual activity: Not on file  Lifestyle  . Physical activity:    Days per week: Not on file    Minutes per session: Not on file  . Stress: Not on file  Relationships  . Social connections:    Talks on phone: Not on file    Gets together: Not on file    Attends religious service: Not on file    Active member of club or organization: Not on file    Attends meetings of clubs or organizations: Not on file    Relationship status: Not on file  . Intimate partner violence:    Fear of current or ex partner: Not on file    Emotionally abused:  Not on file    Physically abused: Not on file    Forced sexual activity: Not on file  Other Topics Concern  . Not on file  Social History Narrative   Lives at home with his wife   Works in his garden and walks 4 days per week   Right handed   Caffeine: minimal    Family History  Problem Relation Age of Onset  . Heart attack Father 54  . Diabetes Mother   . Transient ischemic attack Mother 92  . Diabetes Sister   . Heart attack Sister 38  . Stomach cancer Maternal Grandfather   . Cirrhosis Paternal Uncle        alcoholic  . Alzheimer's disease Brother   . Colon cancer Neg Hx   . Colon polyps Neg Hx     Past Medical History:  Diagnosis Date  . Diverticulosis 2006  .  Erectile dysfunction   . GERD (gastroesophageal reflux disease)   . History of skin cancer   . History of syncope 1994   no etiology; no recurrence  . Hyperlipidemia    LDL goal = < 100, ideally < 70  . Hypertension   . Persistent atrial fibrillation ([place]) 03/06/2014  . PVC's (premature ventricular contractions)   . Rotator cuff tear   . Visit for monitoring Tikosyn therapy 12/19/2017    Past Surgical History:  Procedure Laterality Date  . CARDIOVERSION N/A 12/05/2017   Procedure: CARDIOVERSION;  Surgeon: Dorothy Spark, MD;  Location: [facility] ENDOSCOPY;  Service: Cardiovascular;  Laterality: N/A;  . COLONOSCOPY  2006   Diverticulosis  . CYSTECTOMY     lip  . CYSTECTOMY     anterior thorax-chest  . INGUINAL HERNIA REPAIR      x 2  . 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 SHOULDER OPEN ROTATOR CUFF REPAIR Right 04/23/2014   Procedure: RIGHT SHOULDER ROTATOR CUFF REPAIR WITH GRAFT AND ANCHORS . OPEN ACROMIONECTOMY;  Surgeon: Tobi Bastos, MD;  Location: [facility] ORS;  Service: Orthopedics;  Laterality: Right;  . SPINE SURGERY  2012   Dr Gladstone Lighter  . TEE WITHOUT CARDIOVERSION N/A 12/05/2017   Procedure: TRANSESOPHAGEAL ECHOCARDIOGRAM (TEE);  Surgeon: Dorothy Spark, MD;  Location: [facility] ENDOSCOPY;  Service: Cardiovascular;  Laterality: N/A;  . TONSILLECTOMY AND ADENOIDECTOMY      Current Outpatient Medications  Medication Sig Dispense Refill  . amLODipine (NORVASC) 5 MG tablet Take 1 tablet (5 mg total) by mouth daily. 90 tablet 3  . apixaban (ELIQUIS) 5 MG TABS tablet Take 1 tablet (5 mg total) by mouth 2 (two) times daily. 180 tablet 3  . cholecalciferol (VITAMIN D) 1000 units tablet Take 1,000 Units by mouth daily.    . Cyanocobalamin (VITAMIN B-12) 5000 MCG SUBL Place 5,000 mcg under the tongue daily.    Marland Kitchen dofetilide (TIKOSYN) 500  MCG capsule Take 1 capsule (500 mcg total) by mouth 2 (two) times daily. 180 capsule 2  . furosemide (LASIX) 20 MG tablet Take 1 tablet (20 mg total) by mouth daily. 90 tablet 2  . levETIRAcetam (KEPPRA) 500 MG tablet Take 1 tablet (500 mg total) by mouth 2 (two) times daily. 60 tablet 0  . metoprolol tartrate (LOPRESSOR) 100 MG tablet Take 1 tablet (100 mg total) by mouth 2 (two) times daily. 180 tablet 2  . Multiple Vitamins-Minerals (ICAPS AREDS  2 PO) Take 1 capsule by mouth 2 (two) times daily.    . quinapril (ACCUPRIL) 40 MG tablet Take 1 tablet (40 mg total) by mouth every morning. 90 tablet 3  . sodium chloride (OCEAN) 0.65 % SOLN nasal spray Place 1 spray into both nostrils as needed for congestion.    . Zinc 50 MG CAPS Take 50 mg by mouth every 3 (three) days.      No current facility-administered medications for this visit.     Allergies as of 06/26/2018 - Review Complete 06/26/2018  Allergen Reaction Noted  . Glucosamine Itching and Other (See Comments) 01/21/2011  . Shellfish-derived products Shortness Of Breath and Itching 01/21/2011    Vitals: BP 139/84 (BP Location: Right Arm, Patient Position: Sitting)   Pulse (!) 53   Ht 6\' 1"  (1.854 m)   Wt 185 lb (83.9 kg)   BMI 24.41 kg/m  Last Weight:  Wt Readings from Last 1 Encounters:  06/26/18 185 lb (83.9 kg)   Last Height:   Ht Readings from Last 1 Encounters:  06/26/18 6\' 1"  (1.854 m)   Physical exam: Exam: Gen: NAD, flat affect                    CV: afib , no MRG. No Carotid Bruits. No peripheral edema, warm, nontender Eyes: Conjunctivae clear without exudates or hemorrhage  Neuro: Detailed Neurologic Exam  Speech:    Speech is normal; fluent and spontaneous with normal comprehension.  Cognition:    The patient is oriented to person, place, and time;     recent and remote memory impaired;     language fluent;     Impaired attention, concentration,fund of knowledge Cranial Nerves:    The pupils are equal,  round, and reactive to light. Attempted fundi not visualized due to small pupils. Visual fields are full to finger confrontation. Extraocular movements are intact. Trigeminal sensation is intact and the muscles of mastication are normal. The face is symmetric. The palate elevates in the midline. Hearing intact. Voice is normal. Shoulder shrug is normal. The tongue has normal motion without fasciculations.   Coordination:    No dysmetria  Gait:    Not ataxic  Motor Observation:    No asymmetry, no atrophy, and no involuntary movements noted. Tone:    Normal muscle tone.    Posture:    Posture is slightly stooped    Strength: right arm prox weakness (rotator cuff injury) otherwise intact with some mild LE prox weakness may be due to poor effort     Sensation: intact to LT     Reflex Exam:  DTR's:    Absent AJs Toes:    The toes are equiv bilaterally.   Clonus:    Clonus is absent.       Assessment/Plan:    LAWRANCE WIEDEMANN is a 72 y.o. male here as a referral from Dr. Quay Burow for seizure. 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   - May be alcohol related  - Patient is unable to drive, operate heavy machinery, perform activities at heights or participate in water activities until 6 months to a year seizure free and cleared by physician  - Continue Keppra at this time  - Discussed seizure precautions  - Discussed alcohol cessation, stopping abruptly can cause seizures, withdrawal and death, recommend a structured setting and guidance from pcp  Orders Placed This Encounter  Procedures  . MR  BRAIN W WO CONTRAST  . Comprehensive Drug Analysis,Ur  . Vitamin B1  . B12 and Folate Panel  . Ammonia  . Methylmalonic acid, serum  . Homocysteine  . Urinalysis, Routine w reflex microscopic  . RPR  . EEG   F/u 3 months   Sarina Ill, MD  [facility] [address] [place], [place] [address]  Phone [phone] Fax [phone]

## 2018-06-26 NOTE — Patient Instructions (Addendum)
MRI brain Labs today EEG Continue Keppra  Levetiracetam tablets What is this medicine? LEVETIRACETAM (lee ve tye RA se tam) is an antiepileptic drug. It is used with other medicines to treat certain types of seizures. This medicine may be used for other purposes; ask your health care provider or pharmacist if you have questions. COMMON BRAND NAME(S): Keppra, Roweepra What should I tell my health care provider before I take this medicine? They need to know if you have any of these conditions: -kidney disease -suicidal thoughts, plans, or attempt; a previous suicide attempt by you or a family member -an unusual or allergic reaction to levetiracetam, other medicines, foods, dyes, or preservatives -pregnant or trying to get pregnant -breast-feeding How should I use this medicine? Take this medicine by mouth with a glass of water. Follow the directions on the prescription label. Swallow the tablets whole. Do not crush or chew this medicine. You may take this medicine with or without food. Take your doses at regular intervals. Do not take your medicine more often than directed. Do not stop taking this medicine or any of your seizure medicines unless instructed by your doctor or health care professional. Stopping your medicine suddenly can increase your seizures or their severity. A special MedGuide will be given to you by the pharmacist with each prescription and refill. Be sure to read this information carefully each time. Contact your pediatrician or health care professional regarding the use of this medication in children. While this drug may be prescribed for children as young as 70 years of age for selected conditions, precautions do apply. Overdosage: If you think you have taken too much of this medicine contact a poison control center or emergency room at once. NOTE: This medicine is only for you. Do not share this medicine with others. What if I miss a dose? If you miss a dose, take it as  soon as you can. If it is almost time for your next dose, take only that dose. Do not take double or extra doses. What may interact with this medicine? This medicine may interact with the following medications: -carbamazepine -colesevelam -probenecid -sevelamer This list may not describe all possible interactions. Give your health care provider a list of all the medicines, herbs, non-prescription drugs, or dietary supplements you use. Also tell them if you smoke, drink alcohol, or use illegal drugs. Some items may interact with your medicine. What should I watch for while using this medicine? Visit your doctor or health care professional for a regular check on your progress. Wear a medical identification bracelet or chain to say you have epilepsy, and carry a card that lists all your medications. It is important to take this medicine exactly as instructed by your health care professional. When first starting treatment, your dose may need to be adjusted. It may take weeks or months before your dose is stable. You should contact your doctor or health care professional if your seizures get worse or if you have any new types of seizures. You may get drowsy or dizzy. Do not drive, use machinery, or do anything that needs mental alertness until you know how this medicine affects you. Do not stand or sit up quickly, especially if you are an older patient. This reduces the risk of dizzy or fainting spells. Alcohol may interfere with the effect of this medicine. Avoid alcoholic drinks. The use of this medicine may increase the chance of suicidal thoughts or actions. Pay special attention to how you are responding while  on this medicine. Any worsening of mood, or thoughts of suicide or dying should be reported to your health care professional right away. Women who become pregnant while using this medicine may enroll in the [place] Pregnancy Registry by calling [phone]. This  registry collects information about the safety of antiepileptic drug use during pregnancy. What side effects may I notice from receiving this medicine? Side effects that you should report to your doctor or health care professional as soon as possible: -allergic reactions like skin rash, itching or hives, swelling of the face, lips, or tongue -breathing problems -dark urine -general ill feeling or flu-like symptoms -problems with balance, talking, walking -unusually weak or tired -worsening of mood, thoughts or actions of suicide or dying -yellowing of the eyes or skin Side effects that usually do not require medical attention (report to your doctor or health care professional if they continue or are bothersome): -diarrhea -dizzy, drowsy -headache -loss of appetite This list may not describe all possible side effects. Call your doctor for medical advice about side effects. You may report side effects to FDA at 1-800-FDA-1088. Where should I keep my medicine? Keep out of reach of children. Store at room temperature between 15 and 30 degrees C (59 and 86 degrees F). Throw away any unused medicine after the expiration date. NOTE: This sheet is a summary. It may not cover all possible information. If you have questions about this medicine, talk to your doctor, pharmacist, or health care provider.  2018 Elsevier/Gold Standard (2015-12-10 09:43:54)   Seizure, Adult When you have a seizure:  Parts of your body may move.  How aware or awake (conscious) you are may change.  You may shake (convulse).  Some people have symptoms right before a seizure happens. These symptoms may include:  Fear.  Worry (anxiety).  Feeling like you are going to throw up (nausea).  Feeling like the room is spinning (vertigo).  Feeling like you saw or heard something before (deja vu).  Odd tastes or smells.  Changes in vision, such as seeing flashing lights or spots.  Seizures usually last from 30  seconds to 2 minutes. Usually, they are not harmful unless they last a long time. Follow these instructions at home: Medicines  Take over-the-counter and prescription medicines only as told by your doctor.  Avoid anything that may keep your medicine from working, such as alcohol. Activity  Do not do any activities that would be dangerous if you had another seizure, like driving or swimming. Wait until your doctor approves.  If you live in the [place] ask your local DMV (department of motor vehicles) when you can drive.  Rest. Teaching others  Teach friends and family what to do when you have a seizure. They should: ? Lay you on the ground. ? Protect your head and body. ? Loosen any tight clothing around your neck. ? Turn you on your side. ? Stay with you until you are better. ? Not hold you down. ? Not put anything in your mouth. ? Know whether or not you need emergency care. General instructions  Contact your doctor each time you have a seizure.  Avoid anything that gives you seizures.  Keep a seizure diary. Write down: ? What you think caused each seizure. ? What you remember about each seizure.  Keep all follow-up visits as told by your doctor. This is important. Contact a doctor if:  You have another seizure.  You have seizures more often.  There is  any change in what happens during your seizures.  You continue to have seizures with treatment.  You have symptoms of being sick or having an infection. Get help right away if:  You have a seizure: ? That lasts longer than 5 minutes. ? That is different than seizures you had before. ? That makes it harder to breathe. ? After you hurt your head.  After a seizure, you cannot speak or use a part of your body.  After a seizure, you are confused or have a bad headache.  You have two or more seizures in a row.  You are having seizures more often.  You do not wake up right after a seizure.  You get hurt during a  seizure. In an emergency:  These symptoms may be an emergency. Do not wait to see if the symptoms will go away. Get medical help right away. Call your local emergency services (911 in the [place] Do not drive yourself to the hospital. This information is not intended to replace advice given to you by your health care provider. Make sure you discuss any questions you have with your health care provider. Document Released: 04/25/2008 Document Revised: 07/20/2016 Document Reviewed: 07/20/2016 Elsevier Interactive Patient Education  2017 [place].     Alcohol Abuse and Nutrition Alcohol abuse is any pattern of alcohol consumption that harms your health, relationships, or work. Alcohol abuse can affect how your body breaks down and absorbs nutrients from food by causing your liver to work abnormally. Additionally, many people who abuse alcohol do not eat enough carbohydrates, protein, fat, vitamins, and minerals. This can cause poor nutrition (malnutrition) and a lack of nutrients (nutrient deficiencies), which can lead to further complications. Nutrients that are commonly lacking (deficient) among people who abuse alcohol include:  Vitamins. ? Vitamin A. This is stored in your liver. It is important for your vision, metabolism, and ability to fight off infections (immunity). ? B vitamins. These include vitamins such as folate, thiamin, and niacin. These are important in new cell growth and maintenance. ? Vitamin C. This plays an important role in iron absorption, wound healing, and immunity. ? Vitamin D. This is produced by your liver, but you can also get vitamin D from food. Vitamin D is necessary for your body to absorb and use calcium.  Minerals. ? Calcium. This is important for your bones and your heart and blood vessel (cardiovascular) function. ? Iron. This is important for blood, muscle, and nervous system functioning. ? Magnesium. This plays an important role in muscle and nerve  function, and it helps to control blood sugar and blood pressure. ? Zinc. This is important for the normal function of your nervous system and digestive system (gastrointestinal tract).  Nutrition is an essential component of therapy for alcohol abuse. Your health care provider or dietitian will work with you to design a plan that can help restore nutrients to your body and prevent potential complications. What is my plan? Your dietitian may develop a specific diet plan that is based on your condition and any other complications you may have. A diet plan will commonly include:  A balanced diet. ? Grains: 6-8 oz per day. ? Vegetables: 2-3 cups per day. ? Fruits: 1-2 cups per day. ? Meat and other protein: 5-6 oz per day. ? Dairy: 2-3 cups per day.  Vitamin and mineral supplements.  What do I need to know about alcohol and nutrition?  Consume foods that are high in antioxidants, such as grapes,  berries, nuts, green tea, and dark green and orange vegetables. This can help to counteract some of the stress that is placed on your liver by consuming alcohol.  Avoid food and drinks that are high in fat and sugar. Foods such as sugared soft drinks, salty snack foods, and candy contain empty calories. This means that they lack important nutrients such as protein, fiber, and vitamins.  Eat frequent meals and snacks. Try to eat 5-6 small meals each day.  Eat a variety of fresh fruits and vegetables each day. This will help you get plenty of water, fiber, and vitamins in your diet.  Drink plenty of water and other clear fluids. Try to drink at least 48-64 oz (1.5-2 L) of water per day.  If you are a vegetarian, eat a variety of protein-rich foods. Pair whole grains with plant-based proteins at meals and snacks to obtain the greatest nutrient benefit from your food. For example, eat rice with beans, put peanut butter on whole-grain toast, or eat oatmeal with sunflower seeds.  Soak beans and whole  grains overnight before cooking. This can help your body to absorb the nutrients more easily.  Include foods fortified with vitamins and minerals in your diet. Commonly fortified foods include milk, orange juice, cereal, and bread.  If you are malnourished, your dietitian may recommend a high-protein, high-calorie diet. This may include: ? 2,000-3,000 calories (kilocalories) per day. ? 70-100 grams of protein per day.  Your health care provider may recommend a complete nutritional supplement beverage. This can help to restore calories, protein, and vitamins to your body. Depending on your condition, you may be advised to consume this instead of or in addition to meals.  Limit your intake of caffeine. Replace drinks like coffee and black tea with decaffeinated coffee and herbal tea.  Eat a variety of foods that are high in omega fatty acids. These include fish, nuts and seeds, and soybeans. These foods may help your liver to recover and may also stabilize your mood.  Certain medicines may cause changes in your appetite, taste, and weight. Work with your health care provider and dietitian to make any adjustments to your medicines and diet plan.  Include other healthy lifestyle choices in your daily routine. ? Be physically active. ? Get enough sleep. ? Spend time doing activities that you enjoy.  If you are unable to take in enough food and calories by mouth, your health care provider may recommend a feeding tube. This is a tube that passes through your nose and throat, directly into your stomach. Nutritional supplement beverages can be given to you through the feeding tube to help you get the nutrients you need.  Take vitamin or mineral supplements as recommended by your health care provider. What foods can I eat? Grains Enriched pasta. Enriched rice. Fortified whole-grain bread. Fortified whole-grain cereal. Barley. Brown rice. Quinoa. [place]. Vegetables All fresh, frozen, and canned  vegetables. Spinach. Kale. Artichoke. Carrots. Winter squash and pumpkin. Sweet potatoes. Broccoli. Cabbage. Cucumbers. Tomatoes. Sweet peppers. Green beans. Peas. Corn. Fruits All fresh and frozen fruits. Berries. Grapes. Mango. Papaya. Guava. Cherries. Apples. Bananas. Peaches. Plums. Pineapple. Watermelon. Cantaloupe. Oranges. Avocado. Meats and Other Protein Sources Beef liver. Lean beef. Pork. Fresh and canned chicken. Fresh fish. Oysters. Sardines. Canned tuna. Shrimp. Eggs with yolks. Nuts and seeds. Peanut butter. Beans and lentils. Soybeans. Tofu. Dairy Whole, low-fat, and nonfat milk. Whole, low-fat, and nonfat yogurt. Cottage cheese. Sour cream. Hard and soft cheeses. Beverages Water. Herbal tea. Decaffeinated coffee.  Decaffeinated green tea. 100% fruit juice. 100% vegetable juice. Instant breakfast shakes. Condiments Ketchup. Mayonnaise. Mustard. Salad dressing. Barbecue sauce. Sweets and Desserts Sugar-free ice cream. Sugar-free pudding. Sugar-free gelatin. Fats and Oils Butter. Vegetable oil, flaxseed oil, olive oil, and walnut oil. Other Complete nutrition shakes. Protein bars. Sugar-free gum. The items listed above may not be a complete list of recommended foods or beverages. Contact your dietitian for more options. What foods are not recommended? Grains Sugar-sweetened breakfast cereals. Flavored instant oatmeal. Fried breads. Vegetables Breaded or deep-fried vegetables. Fruits Dried fruit with added sugar. Candied fruit. Canned fruit in syrup. Meats and Other Protein Sources Breaded or deep-fried meats. Dairy Flavored milks. Fried cheese curds or fried cheese sticks. Beverages Alcohol. Sugar-sweetened soft drinks. Sugar-sweetened tea. Caffeinated coffee and tea. Condiments Sugar. Honey. Agave nectar. Molasses. Sweets and Desserts Chocolate. Cake. Cookies. Candy. Other Potato chips. Pretzels. Salted nuts. Candied nuts. The items listed above may not be a complete  list of foods and beverages to avoid. Contact your dietitian for more information. This information is not intended to replace advice given to you by your health care provider. Make sure you discuss any questions you have with your health care provider. Document Released: 09/01/2005 Document Revised: 03/16/2016 Document Reviewed: 06/10/2014 Elsevier Interactive Patient Education  [employer].

## 2018-06-26 NOTE — Telephone Encounter (Signed)
Medicare/BCBS supp order sent to [facility]. No auth they will reach out to the pt to schedule.  °

## 2018-06-27 ENCOUNTER — Ambulatory Visit (INDEPENDENT_AMBULATORY_CARE_PROVIDER_SITE_OTHER): Payer: Medicare Other | Admitting: Neurology

## 2018-06-27 DIAGNOSIS — R569 Unspecified convulsions: Secondary | ICD-10-CM | POA: Diagnosis not present

## 2018-06-27 DIAGNOSIS — R413 Other amnesia: Secondary | ICD-10-CM

## 2018-06-27 DIAGNOSIS — F1020 Alcohol dependence, uncomplicated: Secondary | ICD-10-CM

## 2018-06-27 DIAGNOSIS — Z79899 Other long term (current) drug therapy: Secondary | ICD-10-CM

## 2018-06-28 ENCOUNTER — Encounter: Payer: Self-pay | Admitting: Internal Medicine

## 2018-06-29 LAB — B12 AND FOLATE PANEL: Folate: 18.3 ng/mL (ref >3.0)

## 2018-06-29 LAB — MICROSCOPIC EXAMINATION
Casts: NONE SEEN /lpf
EPITHELIAL CELLS (NON RENAL): NONE SEEN /HPF (ref 0–10)
WBC, UA: NONE SEEN /hpf (ref 0–5)

## 2018-06-29 LAB — METHYLMALONIC ACID, SERUM: Methylmalonic Acid: 78 nmol/L (ref 0–378)

## 2018-06-29 LAB — URINALYSIS, ROUTINE W REFLEX MICROSCOPIC
BILIRUBIN UA: NEGATIVE
Glucose, UA: NEGATIVE
KETONES UA: NEGATIVE
Leukocytes, UA: NEGATIVE
Nitrite, UA: NEGATIVE
PH UA: 7 (ref 5.0–7.5)
PROTEIN UA: NEGATIVE
Specific Gravity, UA: 1.009 (ref 1.005–1.030)
UUROB: 0.2 mg/dL (ref 0.2–1.0)

## 2018-06-29 LAB — AMMONIA: Ammonia: 40 ug/dL (ref 27–102)

## 2018-06-29 LAB — VITAMIN B1: Thiamine: 161.8 nmol/L (ref 66.5–200.0)

## 2018-06-29 LAB — COMPREHENSIVE DRUG ANALYSIS,UR

## 2018-06-29 LAB — RPR: RPR: NONREACTIVE

## 2018-06-29 LAB — HOMOCYSTEINE: Homocysteine: 10.5 umol/L (ref 0.0–15.0)

## 2018-06-29 NOTE — Procedures (Signed)
   HISTORY: 72 years old male, with history of seizure, alcoholism,  TECHNIQUE:  16 channel EEG was performed based on standard 10-16 international system. One channel was dedicated to EKG, which has demonstrates normal sinus rhythm of 66 beats per minutes.  Upon awakening, the posterior background activity was well-developed, in alpha range, 8 Hz, reactive to eye opening and closure.  There was no evidence of epileptiform discharge.  Photic stimulation was performed, which induced a symmetric photic driving.  Hyperventilation was performed, there was no abnormality elicit.  No sleep was achieved.  CONCLUSION: This is a  normal awake EEG.  There is no electrodiagnostic evidence of epileptiform discharge.  Marcial Pacas, M.D. Ph.D.  [facility] [place], [place] [address] Phone: [phone] Fax:      [phone]

## 2018-06-30 ENCOUNTER — Ambulatory Visit
Admission: RE | Admit: 2018-06-30 | Discharge: 2018-06-30 | Disposition: A | Discharge status: Home / Self Care | Payer: Medicare Other | Source: Ambulatory Visit | Attending: Neurology | Admitting: Neurology

## 2018-06-30 ENCOUNTER — Telehealth: Payer: Self-pay | Admitting: Neurology

## 2018-06-30 DIAGNOSIS — R569 Unspecified convulsions: Secondary | ICD-10-CM | POA: Diagnosis not present

## 2018-06-30 DIAGNOSIS — R413 Other amnesia: Secondary | ICD-10-CM | POA: Diagnosis not present

## 2018-06-30 MED ORDER — GADOBENATE DIMEGLUMINE 529 MG/ML IV SOLN
15.0000 mL | Freq: Once | INTRAVENOUS | Status: AC | PRN
  Administered 2018-06-30: 15 mL via INTRAVENOUS

## 2018-06-30 NOTE — Telephone Encounter (Signed)
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 He has to go to the hospital maybe? Take a look at the email I received and let me know:  Dr. Jaynee Eagles, we saw you earlier this week and you put in orders for Sher to have an MRI. Paperwork was sent to [facility]. After numerous phone calls with them, I was informed that they CANNOT do his MRI because he has a penile implant and it has to be done at the hospital. I am unsure how to handle this. Can orders be sent to the hospital for this? thank you for any help -- I am extremely frustrated with [facility] at this point in time.    Concha Pyo   [place]:FYI

## 2018-07-01 MED ORDER — AMLODIPINE BESYLATE 5 MG PO TABS: 10.0000 mg | ORAL_TABLET | Freq: Every day | ORAL | 3 refills | Status: DC

## 2018-07-02 ENCOUNTER — Telehealth: Payer: Self-pay | Admitting: Neurology

## 2018-07-02 NOTE — Telephone Encounter (Signed)
Bethany can youtry to reach them about this. I Called, received no answer on home, cell cut me off. 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 We have an appointment in November and we can review in detail then. This is not new, has developed over many years. No strokes or other findings. He needs to control all his risk factors above as worsening of this small vessel disease can cause increased risk of stroke and dementia and other brain problems. Thanks.  MRI brain:   IMPRESSION: This MRI of the brain with and without contrast shows the following: 1.    Extensive T2/FLAIR hypertense foci in the hemispheres consistent with moderately advanced chronic microvascular ischemic change.  None of the foci appears to be acute. 2.    Brain volume is normal for age.   However, there is mild corpus callosum atrophy. 3.    There are no acute findings and there is a normal enhancement pattern.

## 2018-07-02 NOTE — Telephone Encounter (Signed)
I need the information about the penile implant. Because the hospital will not schedule without the serial number, model and make

## 2018-07-02 NOTE — Telephone Encounter (Signed)
Also, this is old because the patient had his MRI here at [facility] last week on 06/30/18.

## 2018-07-03 NOTE — Telephone Encounter (Signed)
Spoke with pt's wife [name] (on [place]) and discussed the MRI result comments in detail as per Dr. Jaynee Eagles. Discussed the vascular changes, potential causes, risk factors to control and what this can lead to if it worsens. She verbalized understanding and still has more questions (ex. Medications,etc) and stated that she had sent an email to Dr. Jaynee Eagles and would like for her to look at it. RN assured [name] that she would sent message to Dr. Jaynee Eagles to remind her of the email that is in [place]. Pt's wife appreciative.

## 2018-07-04 ENCOUNTER — Other Ambulatory Visit: Payer: Self-pay | Admitting: Neurology

## 2018-07-04 MED ORDER — LEVETIRACETAM 500 MG PO TABS: 500.0000 mg | ORAL_TABLET | Freq: Two times a day (BID) | ORAL | 4 refills | Status: DC

## 2018-07-04 NOTE — Telephone Encounter (Signed)
noted 

## 2018-07-04 NOTE — Telephone Encounter (Signed)
I emailed her back, thanks

## 2018-07-26 DIAGNOSIS — H2513 Age-related nuclear cataract, bilateral: Secondary | ICD-10-CM | POA: Diagnosis not present

## 2018-08-13 ENCOUNTER — Encounter (HOSPITAL_COMMUNITY): Payer: Self-pay | Admitting: Nurse Practitioner

## 2018-08-13 ENCOUNTER — Ambulatory Visit (HOSPITAL_COMMUNITY)
Admission: RE | Admit: 2018-08-13 | Discharge: 2018-08-13 | Disposition: A | Discharge status: Home / Self Care | Payer: Medicare Other | Source: Ambulatory Visit | Attending: Nurse Practitioner | Admitting: Nurse Practitioner

## 2018-08-13 VITALS — BP 132/72 | HR 61 | Ht 73.0 in | Wt 187.0 lb

## 2018-08-13 DIAGNOSIS — Z7901 Long term (current) use of anticoagulants: Secondary | ICD-10-CM | POA: Insufficient documentation

## 2018-08-13 DIAGNOSIS — I481 Persistent atrial fibrillation: Secondary | ICD-10-CM | POA: Insufficient documentation

## 2018-08-13 DIAGNOSIS — Z800 Family history of malignant neoplasm of digestive organs: Secondary | ICD-10-CM | POA: Diagnosis not present

## 2018-08-13 DIAGNOSIS — I491 Atrial premature depolarization: Secondary | ICD-10-CM | POA: Insufficient documentation

## 2018-08-13 DIAGNOSIS — Z87891 Personal history of nicotine dependence: Secondary | ICD-10-CM | POA: Insufficient documentation

## 2018-08-13 DIAGNOSIS — I4510 Unspecified right bundle-branch block: Secondary | ICD-10-CM | POA: Diagnosis not present

## 2018-08-13 DIAGNOSIS — Z8249 Family history of ischemic heart disease and other diseases of the circulatory system: Secondary | ICD-10-CM | POA: Insufficient documentation

## 2018-08-13 DIAGNOSIS — Z886 Allergy status to analgesic agent status: Secondary | ICD-10-CM | POA: Diagnosis not present

## 2018-08-13 DIAGNOSIS — Z79899 Other long term (current) drug therapy: Secondary | ICD-10-CM | POA: Insufficient documentation

## 2018-08-13 DIAGNOSIS — R9431 Abnormal electrocardiogram [ECG] [EKG]: Secondary | ICD-10-CM | POA: Diagnosis not present

## 2018-08-13 DIAGNOSIS — Z8379 Family history of other diseases of the digestive system: Secondary | ICD-10-CM | POA: Diagnosis not present

## 2018-08-13 DIAGNOSIS — I10 Essential (primary) hypertension: Secondary | ICD-10-CM | POA: Insufficient documentation

## 2018-08-13 DIAGNOSIS — Z833 Family history of diabetes mellitus: Secondary | ICD-10-CM | POA: Diagnosis not present

## 2018-08-13 DIAGNOSIS — I4819 Other persistent atrial fibrillation: Secondary | ICD-10-CM

## 2018-08-13 DIAGNOSIS — Z91013 Allergy to seafood: Secondary | ICD-10-CM | POA: Diagnosis not present

## 2018-08-13 DIAGNOSIS — Z9889 Other specified postprocedural states: Secondary | ICD-10-CM | POA: Diagnosis not present

## 2018-08-13 DIAGNOSIS — Z09 Encounter for follow-up examination after completed treatment for conditions other than malignant neoplasm: Secondary | ICD-10-CM | POA: Insufficient documentation

## 2018-08-13 DIAGNOSIS — Z823 Family history of stroke: Secondary | ICD-10-CM | POA: Diagnosis not present

## 2018-08-13 LAB — BASIC METABOLIC PANEL
Anion gap: 9 (ref 5–15)
BUN: 8 mg/dL (ref 8–23)
CHLORIDE: 95 mmol/L — AB (ref 98–111)
CO2: 28 mmol/L (ref 22–32)
Calcium: 9 mg/dL (ref 8.9–10.3)
Creatinine, Ser: 0.88 mg/dL (ref 0.61–1.24)
GFR calc Af Amer: >60 mL/min (ref >60)
GFR calc non Af Amer: >60 mL/min (ref >60)
Glucose, Bld: 108 mg/dL — ABNORMAL HIGH (ref 70–99)
POTASSIUM: 4.3 mmol/L (ref 3.5–5.1)
SODIUM: 132 mmol/L — AB (ref 135–145)

## 2018-08-13 LAB — MAGNESIUM: Magnesium: 2.1 mg/dL (ref 1.7–2.4)
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
## 2018-08-15 NOTE — Addendum Note (Signed)
Encounter addended by: Sherran Needs, NP on: 08/15/2018 4:51 PM  Actions taken: Sign clinical note

## 2018-08-20 DIAGNOSIS — L821 Other seborrheic keratosis: Secondary | ICD-10-CM | POA: Diagnosis not present

## 2018-08-20 DIAGNOSIS — D229 Melanocytic nevi, unspecified: Secondary | ICD-10-CM | POA: Diagnosis not present

## 2018-08-20 DIAGNOSIS — L570 Actinic keratosis: Secondary | ICD-10-CM | POA: Diagnosis not present

## 2018-08-20 DIAGNOSIS — D1801 Hemangioma of skin and subcutaneous tissue: Secondary | ICD-10-CM | POA: Diagnosis not present

## 2018-08-20 DIAGNOSIS — L814 Other melanin hyperpigmentation: Secondary | ICD-10-CM | POA: Diagnosis not present

## 2018-08-22 ENCOUNTER — Encounter: Payer: Self-pay | Admitting: Internal Medicine

## 2018-08-22 DIAGNOSIS — H2513 Age-related nuclear cataract, bilateral: Secondary | ICD-10-CM | POA: Diagnosis not present

## 2018-08-22 DIAGNOSIS — H353132 Nonexudative age-related macular degeneration, bilateral, intermediate dry stage: Secondary | ICD-10-CM | POA: Diagnosis not present

## 2018-08-22 DIAGNOSIS — H43813 Vitreous degeneration, bilateral: Secondary | ICD-10-CM | POA: Diagnosis not present

## 2018-08-30 DIAGNOSIS — Z23 Encounter for immunization: Secondary | ICD-10-CM | POA: Diagnosis not present

## 2018-09-26 ENCOUNTER — Encounter: Payer: Self-pay | Admitting: Neurology

## 2018-09-26 ENCOUNTER — Ambulatory Visit (INDEPENDENT_AMBULATORY_CARE_PROVIDER_SITE_OTHER): Payer: Medicare Other | Admitting: Neurology

## 2018-09-26 VITALS — BP 150/82 | HR 53 | Ht 72.0 in | Wt 203.0 lb

## 2018-09-26 DIAGNOSIS — F10230 Alcohol dependence with withdrawal, uncomplicated: Secondary | ICD-10-CM

## 2018-09-26 DIAGNOSIS — F10930 Alcohol use, unspecified with withdrawal, uncomplicated: Secondary | ICD-10-CM
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
## 2018-10-02 NOTE — Progress Notes (Signed)
Subjective:    Patient ID: Sergio Mack, male    DOB: 19-Nov-1946, 72 y.o.   MRN: [id number]  HPI He is here for an acute visit for cold symptoms.  His symptoms started more than 1 week ago  He is experiencing sore throat, nasal congestion with green-bloody discharge, postnasal drip, sinus pain and pressure, cough that is occasionally productive-likely from sinus drainage and sinus headaches.  He denies any fevers, chills, ear pain, shortness of breath, wheezing and lightheadedness.  He has tried taking Tylenol.   Medications and allergies reviewed with patient and updated if appropriate.  Patient Active Problem List   Diagnosis Date Noted  . Alcohol withdrawal seizure ([place]) 06/26/2018  . Alcohol abuse 06/25/2018  . Hypokalemia 06/25/2018  . Leukocytosis 06/25/2018  . Head trauma 04/20/2018  . Abrasion 04/20/2018  . Rib pain on left side 04/20/2018  . Persistent atrial fibrillation 12/19/2017  . HFrEF (heart failure with reduced ejection fraction) ([place]) 12/04/2017  . Memory difficulties 10/26/2017  . Cramping of feet 04/24/2017  . Headache 04/24/2017  . 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 Sensorineural hearing loss (SNHL), 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  . Prediabetes 04/07/2016  . Full thickness rotator cuff tear 04/23/2014  . Anticoagulation adequate, new on eliquis 03/07/2014  . SKIN CANCER, HX OF 03/24/2010  . DIVERTICULOSIS, COLON 12/11/2008  . SPINAL STENOSIS 01/14/2008  . BACK PAIN, LUMBAR, WITH RADICULOPATHY 01/02/2008  . Essential hypertension 10/31/2007  . 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 GERD 09/27/2007  . Localized swelling, mass, and lump of head 09/27/2007    Current Outpatient Medications on File Prior to Visit  Medication Sig Dispense Refill  . amLODipine (NORVASC) 5 MG tablet Take 2 tablets (10 mg total) by mouth daily. 90 tablet 3  . apixaban (ELIQUIS) 5 MG TABS tablet Take 1 tablet (5 mg total) by mouth 2 (two) times  daily. 180 tablet 3  . cholecalciferol (VITAMIN D) 1000 units tablet Take 1,000 Units by mouth daily.    . Cyanocobalamin (VITAMIN B-12) 5000 MCG SUBL Place 5,000 mcg under the tongue daily.    Marland Kitchen dofetilide (TIKOSYN) 500 MCG capsule Take 1 capsule (500 mcg total) by mouth 2 (two) times daily. 180 capsule 2  . furosemide (LASIX) 20 MG tablet Take 1 tablet (20 mg total) by mouth daily. 90 tablet 2  . levETIRAcetam (KEPPRA) 500 MG tablet Take 1 tablet (500 mg total) by mouth 2 (two) times daily. 180 tablet 4  . metoprolol tartrate (LOPRESSOR) 100 MG tablet Take 1 tablet (100 mg total) by mouth 2 (two) times daily. 180 tablet 2  . Multiple Vitamins-Minerals (ICAPS AREDS 2 PO) Take 1 capsule by mouth 2 (two) times daily.    . quinapril (ACCUPRIL) 40 MG tablet Take 1 tablet (40 mg total) by mouth every morning. 90 tablet 3  . sodium chloride (OCEAN) 0.65 % SOLN nasal spray Place 1 spray into both nostrils as needed for congestion.    . Zinc 50 MG CAPS Take 50 mg by mouth as needed.      No current facility-administered medications on file prior to visit.     Past Medical History:  Diagnosis Date  . Diverticulosis 2006  . Erectile dysfunction   . GERD (gastroesophageal reflux disease)   . History of skin cancer   . History of syncope 1994   no etiology; no recurrence  . Hyperlipidemia    LDL goal = < 100, ideally < 70  .  Hypertension   . Persistent atrial fibrillation 03/06/2014  . PVC's (premature ventricular contractions)   . Rotator cuff tear   . Visit for monitoring Tikosyn therapy 12/19/2017    Past Surgical History:  Procedure Laterality Date  . CARDIOVERSION N/A 12/05/2017   Procedure: CARDIOVERSION;  Surgeon: Dorothy Spark, MD;  Location: [facility];  Service: Cardiovascular;  Laterality: N/A;  . COLONOSCOPY  2006   Diverticulosis  . CYSTECTOMY     lip  . CYSTECTOMY     anterior thorax-chest  . INGUINAL HERNIA REPAIR      x 2  . PENILE PROSTHESIS IMPLANT N/A  12/09/2016   Procedure: PENILE PROTHESIS INFLATABLE THREE PIECE COLOPLAST;  Surgeon: Kathie Rhodes, MD;  Location: [facility] ORS;  Service: Urology;  Laterality: N/A;  . SHOULDER OPEN ROTATOR CUFF REPAIR Right 04/23/2014   Procedure: RIGHT SHOULDER ROTATOR CUFF REPAIR WITH GRAFT AND ANCHORS . OPEN ACROMIONECTOMY;  Surgeon: Tobi Bastos, MD;  Location: [facility] OR[facility];  Service: Orthopedics;  Laterality: Right;  . SPINE SURGERY  2012   Dr Gladstone Lighter  . TEE WITHOUT CARDIOVERSION N/A 12/05/2017   Procedure: TRANSESOPHAGEAL ECHOCARDIOGRAM (TEE);  Surgeon: Dorothy Spark, MD;  Location: [facility];  Service: Cardiovascular;  Laterality: N/A;  . TONSILLECTOMY AND ADENOIDECTOMY      Social History   Socioeconomic History  . Marital status: Married    Spouse name: Not on file  . Number of children: 1  . Years of education: Not on file  . Highest education level: High school graduate  Occupational History  . Not on file  Social Needs  . Financial resource strain: Not on file  . Food insecurity:    Worry: Not on file    Inability: Not on file  . Transportation needs:    Medical: Not on file    Non-medical: Not on file  Tobacco Use  . Smoking status: Former Smoker    Packs/day: 1.00    Types: Cigarettes    Last attempt to quit: 1990    Years since quitting: 29.8  . Smokeless tobacco: Former Systems developer    Types: Chew    Quit date: 53  . Tobacco comment: smoked [place] with 12 year abstinence ( 21 total years of smoking), up to 1 ppd  Substance and Sexual Activity  . Alcohol use: Not Currently    Comment: 09/26/18 NONE; previous 4 shots of alcohol a few days a week  . Drug use: Never  . Sexual activity: Not on file  Lifestyle  . Physical activity:    Days per week: Not on file    Minutes per session: Not on file  . Stress: Not on file  Relationships  . Social connections:    Talks on phone: Not on file    Gets together: Not on file    Attends religious service: Not on file    Active member  of club or organization: Not on file    Attends meetings of clubs or organizations: Not on file    Relationship status: Not on file  Other Topics Concern  . Not on file  Social History Narrative   Lives at home with his wife   Works in his garden and walks 4 days per week   Right handed   Caffeine: minimal    Family History  Problem Relation Age of Onset  . Heart attack Father 62  . Diabetes Mother   . Transient ischemic attack Mother 83  . Diabetes Sister   .  Heart attack Sister 73  . Stomach cancer Maternal Grandfather   . Cirrhosis Paternal Uncle        alcoholic  . Alzheimer's disease Brother   . Colon cancer Neg Hx   . Colon polyps Neg Hx     Review of Systems  Constitutional: Negative for chills and fever.  HENT: Positive for congestion (green-bloody mucus), postnasal drip, sinus pressure, sinus pain and sore throat. Negative for ear pain.   Respiratory: Positive for cough (productive - likely from PND). Negative for shortness of breath and wheezing.   Neurological: Positive for headaches (sinus related). Negative for light-headedness.       Objective:   Vitals:   10/03/18 0925  BP: 118/66  Pulse: 71  Resp: 16  Temp: 98.6 F (37 C)  SpO2: 96%   Filed Weights   10/03/18 0925  Weight: 202 lb (91.6 kg)   Body mass index is 27.4 kg/m.  Wt Readings from Last 3 Encounters:  10/03/18 202 lb (91.6 kg)  09/26/18 203 lb (92.1 kg)  08/13/18 187 lb (84.8 kg)     Physical Exam GENERAL APPEARANCE: Appears stated age, well appearing, NAD EYES: conjunctiva clear, no icterus HEENT: bilateral tympanic membranes and ear canals normal, oropharynx with mild erythema, maxillary sinus tenderness with palpation, no thyromegaly, trachea midline, no cervical or supraclavicular lymphadenopathy LUNGS: Clear to auscultation without wheeze or crackles, unlabored breathing, good air entry bilaterally CARDIOVASCULAR: Normal S1,S2 without murmurs, no edema SKIN: warm,  dry        Assessment & Plan:   See Problem List for Assessment and Plan of chronic medical problems.

## 2018-10-03 ENCOUNTER — Ambulatory Visit (INDEPENDENT_AMBULATORY_CARE_PROVIDER_SITE_OTHER): Payer: Medicare Other | Admitting: Internal Medicine

## 2018-10-03 ENCOUNTER — Encounter: Payer: Self-pay | Admitting: Internal Medicine

## 2018-10-03 VITALS — BP 118/66 | HR 71 | Temp 98.6°F | Resp 16 | Ht 72.0 in | Wt 202.0 lb

## 2018-10-03 DIAGNOSIS — J019 Acute sinusitis, unspecified: Secondary | ICD-10-CM

## 2018-10-03 MED ORDER — DOXYCYCLINE HYCLATE 100 MG PO TABS: 100.0000 mg | ORAL_TABLET | Freq: Two times a day (BID) | ORAL | 0 refills | Status: DC

## 2018-10-03 NOTE — Patient Instructions (Signed)
Take the antibiotic as prescribed - complete the entire course.    Continue tylenol as needed.  Increase your fluids and rest.    Call if no improvement       Sinusitis, Adult Sinusitis is soreness and inflammation of your sinuses. Sinuses are hollow spaces in the bones around your face. Your sinuses are located:  Around your eyes.  In the middle of your forehead.  Behind your nose.  In your cheekbones.  Your sinuses and nasal passages are lined with a stringy fluid (mucus). Mucus normally drains out of your sinuses. When your nasal tissues become inflamed or swollen, the mucus can become trapped or blocked so air cannot flow through your sinuses. This allows bacteria, viruses, and funguses to grow, which leads to infection. Sinusitis can develop quickly and last for 7?10 days (acute) or for more than 12 weeks (chronic). Sinusitis often develops after a cold. What are the causes? This condition is caused by anything that creates swelling in the sinuses or stops mucus from draining, including:  Allergies.  Asthma.  Bacterial or viral infection.  Abnormally shaped bones between the nasal passages.  Nasal growths that contain mucus (nasal polyps).  Narrow sinus openings.  Pollutants, such as chemicals or irritants in the air.  A foreign object stuck in the nose.  A fungal infection. This is rare.  What increases the risk? The following factors may make you more likely to develop this condition:  Having allergies or asthma.  Having had a recent cold or respiratory tract infection.  Having structural deformities or blockages in your nose or sinuses.  Having a weak immune system.  Doing a lot of swimming or diving.  Overusing nasal sprays.  Smoking.  What are the signs or symptoms? The main symptoms of this condition are pain and a feeling of pressure around the affected sinuses. Other symptoms include:  Upper toothache.  Earache.  Headache.  Bad  breath.  Decreased sense of smell and taste.  A cough that may get worse at night.  Fatigue.  Fever.  Thick drainage from your nose. The drainage is often green and it may contain pus (purulent).  Stuffy nose or congestion.  Postnasal drip. This is when extra mucus collects in the throat or back of the nose.  Swelling and warmth over the affected sinuses.  Sore throat.  Sensitivity to light.  How is this diagnosed? This condition is diagnosed based on symptoms, a medical history, and a physical exam. To find out if your condition is acute or chronic, your health care provider may:  Look in your nose for signs of nasal polyps.  Tap over the affected sinus to check for signs of infection.  View the inside of your sinuses using an imaging device that has a light attached (endoscope).  If your health care provider suspects that you have chronic sinusitis, you may also:  Be tested for allergies.  Have a sample of mucus taken from your nose (nasal culture) and checked for bacteria.  Have a mucus sample examined to see if your sinusitis is related to an allergy.  If your sinusitis does not respond to treatment and it lasts longer than 8 weeks, you may have an MRI or CT scan to check your sinuses. These scans also help to determine how severe your infection is. In rare cases, a bone biopsy may be done to rule out more serious types of fungal sinus disease. How is this treated? Treatment for sinusitis depends on the  cause and whether your condition is chronic or acute. If a virus is causing your sinusitis, your symptoms will go away on their own within 10 days. You may be given medicines to relieve your symptoms, including:  Topical nasal decongestants. They shrink swollen nasal passages and let mucus drain from your sinuses.  Antihistamines. These drugs block inflammation that is triggered by allergies. This can help to ease swelling in your nose and sinuses.  Topical nasal  corticosteroids. These are nasal sprays that ease inflammation and swelling in your nose and sinuses.  Nasal saline washes. These rinses can help to get rid of thick mucus in your nose.  If your condition is caused by bacteria, you will be given an antibiotic medicine. If your condition is caused by a fungus, you will be given an antifungal medicine. Surgery may be needed to correct underlying conditions, such as narrow nasal passages. Surgery may also be needed to remove polyps. Follow these instructions at home: Medicines  Take, use, or apply over-the-counter and prescription medicines only as told by your health care provider. These may include nasal sprays.  If you were prescribed an antibiotic medicine, take it as told by your health care provider. Do not stop taking the antibiotic even if you start to feel better. Hydrate and Humidify  Drink enough water to keep your urine clear or pale yellow. Staying hydrated will help to thin your mucus.  Use a cool mist humidifier to keep the humidity level in your home above 50%.  Inhale steam for 10-15 minutes, 3-4 times a day or as told by your health care provider. You can do this in the bathroom while a hot shower is running.  Limit your exposure to cool or dry air. Rest  Rest as much as possible.  Sleep with your head raised (elevated).  Make sure to get enough sleep each night. General instructions  Apply a warm, moist washcloth to your face 3-4 times a day or as told by your health care provider. This will help with discomfort.  Wash your hands often with soap and water to reduce your exposure to viruses and other germs. If soap and water are not available, use hand sanitizer.  Do not smoke. Avoid being around people who are smoking (secondhand smoke).  Keep all follow-up visits as told by your health care provider. This is important. Contact a health care provider if:  You have a fever.  Your symptoms get worse.  Your  symptoms do not improve within 10 days. Get help right away if:  You have a severe headache.  You have persistent vomiting.  You have pain or swelling around your face or eyes.  You have vision problems.  You develop confusion.  Your neck is stiff.  You have trouble breathing. This information is not intended to replace advice given to you by your health care provider. Make sure you discuss any questions you have with your health care provider. Document Released: 11/07/2005 Document Revised: 07/03/2016 Document Reviewed: 09/02/2015 Elsevier Interactive Patient Education  [employer].

## 2018-10-03 NOTE — Assessment & Plan Note (Signed)
Likely bacterial  Start doxycycline otc cold medications Rest, fluid Call if no improvement  

## 2018-10-08 ENCOUNTER — Other Ambulatory Visit: Payer: Self-pay | Admitting: Internal Medicine

## 2018-10-08 DIAGNOSIS — R7303 Prediabetes: Secondary | ICD-10-CM

## 2018-10-08 DIAGNOSIS — I10 Essential (primary) hypertension: Secondary | ICD-10-CM

## 2018-10-08 DIAGNOSIS — I4819 Other persistent atrial fibrillation: Secondary | ICD-10-CM

## 2018-10-09 ENCOUNTER — Other Ambulatory Visit: Payer: Self-pay

## 2018-10-12 ENCOUNTER — Other Ambulatory Visit: Payer: Self-pay | Admitting: Internal Medicine

## 2018-10-12 MED ORDER — DOXYCYCLINE HYCLATE 100 MG PO TABS: 100.0000 mg | ORAL_TABLET | Freq: Two times a day (BID) | ORAL | 0 refills | Status: DC

## 2018-10-23 DIAGNOSIS — H25013 Cortical age-related cataract, bilateral: Secondary | ICD-10-CM | POA: Diagnosis not present

## 2018-10-23 DIAGNOSIS — H353131 Nonexudative age-related macular degeneration, bilateral, early dry stage: Secondary | ICD-10-CM | POA: Diagnosis not present

## 2018-10-23 DIAGNOSIS — H25043 Posterior subcapsular polar age-related cataract, bilateral: Secondary | ICD-10-CM | POA: Diagnosis not present

## 2018-10-23 DIAGNOSIS — H18413 Arcus senilis, bilateral: Secondary | ICD-10-CM | POA: Diagnosis not present

## 2018-10-23 DIAGNOSIS — H2512 Age-related nuclear cataract, left eye: Secondary | ICD-10-CM | POA: Diagnosis not present

## 2018-10-23 DIAGNOSIS — H2513 Age-related nuclear cataract, bilateral: Secondary | ICD-10-CM | POA: Diagnosis not present

## 2018-10-24 ENCOUNTER — Other Ambulatory Visit (INDEPENDENT_AMBULATORY_CARE_PROVIDER_SITE_OTHER): Payer: Medicare Other

## 2018-10-24 DIAGNOSIS — R7303 Prediabetes: Secondary | ICD-10-CM | POA: Diagnosis not present

## 2018-10-24 DIAGNOSIS — I10 Essential (primary) hypertension: Secondary | ICD-10-CM | POA: Diagnosis not present

## 2018-10-24 DIAGNOSIS — I4819 Other persistent atrial fibrillation: Secondary | ICD-10-CM

## 2018-10-24 LAB — CBC WITH DIFFERENTIAL/PLATELET
BASOS PCT: 0.5 % (ref 0.0–3.0)
Basophils Absolute: 0.1 10*3/uL (ref 0.0–0.1)
EOS PCT: 1.4 % (ref 0.0–5.0)
Eosinophils Absolute: 0.2 10*3/uL (ref 0.0–0.7)
HEMATOCRIT: 42.5 % (ref 39.0–52.0)
Hemoglobin: 14.2 g/dL (ref 13.0–17.0)
Lymphocytes Relative: 22.4 % (ref 12.0–46.0)
Lymphs Abs: 2.7 10*3/uL (ref 0.7–4.0)
MCHC: 33.5 g/dL (ref 30.0–36.0)
MCV: 89 fl (ref 78.0–100.0)
MONO ABS: 1.2 10*3/uL — AB (ref 0.1–1.0)
MONOS PCT: 10.1 % (ref 3.0–12.0)
Neutro Abs: 8 10*3/uL — ABNORMAL HIGH (ref 1.4–7.7)
Neutrophils Relative %: 65.6 % (ref 43.0–77.0)
Platelets: 278 10*3/uL (ref 150.0–400.0)
RBC: 4.77 Mil/uL (ref 4.22–5.81)
RDW: 13.6 % (ref 11.5–15.5)
WBC: 12.2 10*3/uL — ABNORMAL HIGH (ref 4.0–10.5)

## 2018-10-24 LAB — LIPID PANEL
CHOLESTEROL: 123 mg/dL (ref 0–200)
HDL: 39.1 mg/dL (ref >39.00)
LDL Cholesterol: 65 mg/dL (ref 0–99)
NonHDL: 84.37
TRIGLYCERIDES: 98 mg/dL (ref 0.0–149.0)
Total CHOL/HDL Ratio: 3
VLDL: 19.6 mg/dL (ref 0.0–40.0)

## 2018-10-24 LAB — COMPREHENSIVE METABOLIC PANEL
ALBUMIN: 4.1 g/dL (ref 3.5–5.2)
ALK PHOS: 73 U/L (ref 39–117)
ALT: 25 U/L (ref 0–53)
AST: 24 U/L (ref 0–37)
BILIRUBIN TOTAL: 0.4 mg/dL (ref 0.2–1.2)
BUN: 7 mg/dL (ref 6–23)
CALCIUM: 9.1 mg/dL (ref 8.4–10.5)
CO2: 26 mEq/L (ref 19–32)
CREATININE: 0.96 mg/dL (ref 0.40–1.50)
Chloride: 96 mEq/L (ref 96–112)
GFR: 81.57 mL/min (ref >60.00)
Glucose, Bld: 96 mg/dL (ref 70–99)
Potassium: 4.2 mEq/L (ref 3.5–5.1)
SODIUM: 129 meq/L — AB (ref 135–145)
TOTAL PROTEIN: 7 g/dL (ref 6.0–8.3)

## 2018-10-24 LAB — TSH: TSH: 1.55 u[IU]/mL (ref 0.35–4.50)

## 2018-10-24 LAB — HEMOGLOBIN A1C: Hgb A1c MFr Bld: 6 % (ref 4.6–6.5)

## 2018-10-25 ENCOUNTER — Telehealth: Payer: Self-pay | Admitting: *Deleted

## 2018-10-25 NOTE — Telephone Encounter (Addendum)
   Primary Cardiologist: Thompson Grayer, MD / [facility]  Chart reviewed as part of pre-operative protocol coverage. Cataract extractions are recognized in guidelines as low risk surgeries that do not typically require specific preoperative testing or holding of blood thinner therapy. Therefore, given past medical history and time since last visit, based on ACC/AHA guidelines, VANDEN FAWAZ would be at acceptable risk for the planned procedure without further cardiovascular testing.   I will route this recommendation to the requesting party via Epic fax function and remove from pre-op pool.  Please call with questions.  Charlie Pitter, PA-C 10/25/2018, 5:05 PM

## 2018-10-25 NOTE — Telephone Encounter (Signed)
   [facility] Pre-operative Risk Assessment    Request for surgical clearance:  1. What type of surgery is being performed? CATARACT EXTRACTION W/ INTRAOCULAR IMPLANTATION LEFT EYE FOLLOWED BY RIGHT EYE  2. When is this surgery scheduled? 1/13/130 AND 12/24/18   3. What type of clearance is required (medical clearance vs. Pharmacy clearance to hold med vs. Both)? BOTH  Are there any medications that need to be held prior to surgery and how long?ELIQUIS 4. Practice name and name of physician performing surgery? [facility] DR TIMOTHY BEVIS  5. What is your office phone number [phone]   7.   What is your office fax number [phone]  8.   Anesthesia type (None, local, MAC, general) ? CHOICE   Devra Dopp 10/25/2018, 3:13 PM  _________________________________________________________________   (provider comments below)

## 2018-10-29 ENCOUNTER — Ambulatory Visit (INDEPENDENT_AMBULATORY_CARE_PROVIDER_SITE_OTHER): Payer: Medicare Other | Admitting: Physician Assistant

## 2018-10-29 ENCOUNTER — Encounter: Payer: Self-pay | Admitting: Physician Assistant

## 2018-10-29 ENCOUNTER — Telehealth: Payer: Self-pay | Admitting: *Deleted

## 2018-10-29 VITALS — BP 108/60 | HR 62 | Ht 72.0 in | Wt 202.0 lb

## 2018-10-29 DIAGNOSIS — Z7901 Long term (current) use of anticoagulants: Secondary | ICD-10-CM

## 2018-10-29 DIAGNOSIS — Z7189 Other specified counseling: Secondary | ICD-10-CM | POA: Diagnosis not present

## 2018-10-29 DIAGNOSIS — Z86010 Personal history of colonic polyps: Secondary | ICD-10-CM

## 2018-10-29 MED ORDER — NA SULFATE-K SULFATE-MG SULF 17.5-3.13-1.6 GM/177ML PO SOLN: ORAL | 0 refills | Status: DC

## 2018-10-29 NOTE — Patient Instructions (Addendum)
You have been scheduled for a colonoscopy. Please follow written instructions given to you at your visit today.  Please pick up your prep supplies at the pharmacy within the next 1-3 days. [facility], [place], [place] If you use inhalers (even only as needed), please bring them with you on the day of your procedure.  We will call you with the Eliquis clearance instructions from Dr. Rayann Heman. Your physician has requested that you go to www.[email or web address].[email or web address] and enter the access code given to you at your visit today. This web site gives a general overview about your procedure. However, you should still follow specific instructions given to you by our office regarding your preparation for the procedure.

## 2018-10-29 NOTE — Telephone Encounter (Signed)
[facility] Pre-operative Risk Assessment     Request for surgical clearance:     Endoscopy Procedure  What type of surgery is being performed?     Colonoscopy  When is this surgery scheduled?     11-26-2018  What type of clearance is required ?   Pharmacy  Are there any medications that need to be held prior to surgery and how long? Eliquis- we typically hold this 1-2 days prior to the procedure date.   Practice name and name of physician performing surgery?      [facility]  What is your office phone and fax number?      Phone- [phone]  [phone]  Anesthesia type (None, local, MAC, general) ?       MAC

## 2018-10-30 ENCOUNTER — Encounter: Payer: Self-pay | Admitting: Physician Assistant

## 2018-10-30 DIAGNOSIS — E871 Hypo-osmolality and hyponatremia: Secondary | ICD-10-CM | POA: Insufficient documentation

## 2018-10-30 NOTE — Patient Instructions (Addendum)
  Medications reviewed and updated.  Changes include :   none    Please followup in 6 months   

## 2018-10-30 NOTE — Progress Notes (Signed)
Subjective:    Patient ID: Sergio Mack, male    DOB: 1946-08-04, 72 y.o.   MRN: [id number]  HPI The patient is here for follow up.  He is having cataract surgery and his colonoscopy done next month.  Dry cough: For the past few days he has been experiencing a dry cough.  He does have chronic nasal congestion, but this has not changed.  He denies other cold symptoms or fever.  He has not had any shortness of breath or wheezing.  Hyponatremia : His more recent blood work shows low sodium.  Looking back over the past several years this has been an ongoing issue and has been relatively mild.  Prediabetes:  He is compliant with a low sugar/carbohydrate diet.  He is exercising regularly - walking.  Hypertension: He is taking his medication daily. He is compliant with a low sodium diet.  He denies chest pain, palpitations, edema, shortness of breath and regular headaches. He is exercising regularly-walking.   Persistent Afib: He is following with cardiology.  He denies any palpitations, chest pain or shortness of breath.  He denies leg edema.  He is taking his medication on a daily basis.  H/o alcohol abuse: He has been abstinent of alcohol since his last visit.  Medications and allergies reviewed with patient and updated if appropriate.  Patient Active Problem List   Diagnosis Date Noted  . Hyponatremia 10/30/2018  . Alcohol withdrawal seizure ([place]) 06/26/2018  . Alcohol abuse 06/25/2018  . Hypokalemia 06/25/2018  . Leukocytosis 06/25/2018  . Head trauma 04/20/2018  . Persistent atrial fibrillation 12/19/2017  . HFrEF (heart failure with reduced ejection fraction) ([place]) 12/04/2017  . Memory difficulties 10/26/2017  . Cramping of feet 04/24/2017  . 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 Sensorineural hearing loss (SNHL), 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  . Prediabetes 04/07/2016  . Full thickness rotator cuff tear 04/23/2014  . Anticoagulation adequate, new on eliquis 03/07/2014  .  SKIN CANCER, HX OF 03/24/2010  . DIVERTICULOSIS, COLON 12/11/2008  . SPINAL STENOSIS 01/14/2008  . BACK PAIN, LUMBAR, WITH RADICULOPATHY 01/02/2008  . Essential hypertension 10/31/2007  . 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 GERD 09/27/2007    Current Outpatient Medications on File Prior to Visit  Medication Sig Dispense Refill  . amLODipine (NORVASC) 5 MG tablet Take 2 tablets (10 mg total) by mouth daily. 90 tablet 3  . apixaban (ELIQUIS) 5 MG TABS tablet Take 1 tablet (5 mg total) by mouth 2 (two) times daily. 180 tablet 3  . cholecalciferol (VITAMIN D) 1000 units tablet Take 1,000 Units by mouth daily.    . Cyanocobalamin (VITAMIN B-12) 5000 MCG SUBL Place 5,000 mcg under the tongue daily.    Marland Kitchen dofetilide (TIKOSYN) 500 MCG capsule Take 1 capsule (500 mcg total) by mouth 2 (two) times daily. 180 capsule 2  . furosemide (LASIX) 20 MG tablet Take 1 tablet (20 mg total) by mouth daily. 90 tablet 2  . levETIRAcetam (KEPPRA) 500 MG tablet Take 1 tablet (500 mg total) by mouth 2 (two) times daily. 180 tablet 4  . metoprolol tartrate (LOPRESSOR) 100 MG tablet Take 1 tablet (100 mg total) by mouth 2 (two) times daily. 180 tablet 2  . Multiple Vitamins-Minerals (ICAPS AREDS 2 PO) Take 1 capsule by mouth 2 (two) times daily.    . Na Sulfate-K Sulfate-Mg Sulf 17.5-3.13-1.6 GM/177ML SOLN Take as directed for colonoscopy. 354 mL 0  . quinapril (ACCUPRIL) 40 MG tablet Take  1 tablet (40 mg total) by mouth every morning. 90 tablet 3  . sodium chloride (OCEAN) 0.65 % SOLN nasal spray Place 1 spray into both nostrils as needed for congestion.    . Zinc 50 MG CAPS Take 50 mg by mouth as needed.      No current facility-administered medications on file prior to visit.     Past Medical History:  Diagnosis Date  . Diverticulosis 2006  . Erectile dysfunction   . GERD (gastroesophageal reflux disease)   . History of skin cancer   . History of syncope 1994   no etiology; no  recurrence  . Hyperlipidemia    LDL goal = < 100, ideally < 70  . Hypertension   . Persistent atrial fibrillation 03/06/2014  . PVC's (premature ventricular contractions)   . Rotator cuff tear   . Visit for monitoring Tikosyn therapy 12/19/2017    Past Surgical History:  Procedure Laterality Date  . CARDIOVERSION N/A 12/05/2017   Procedure: CARDIOVERSION;  Surgeon: Dorothy Spark, MD;  Location: [facility] ENDOSCOPY;  Service: Cardiovascular;  Laterality: N/A;  . COLONOSCOPY  2006   Diverticulosis  . CYSTECTOMY     lip  . CYSTECTOMY     anterior thorax-chest  . INGUINAL HERNIA REPAIR      x 2  . 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 SHOULDER OPEN ROTATOR CUFF REPAIR Right 04/23/2014   Procedure: RIGHT SHOULDER ROTATOR CUFF REPAIR WITH GRAFT AND ANCHORS . OPEN ACROMIONECTOMY;  Surgeon: Tobi Bastos, MD;  Location: [facility] ORS;  Service: Orthopedics;  Laterality: Right;  . SPINE SURGERY  2012   Dr Gladstone Lighter  . TEE WITHOUT CARDIOVERSION N/A 12/05/2017   Procedure: TRANSESOPHAGEAL ECHOCARDIOGRAM (TEE);  Surgeon: Dorothy Spark, MD;  Location: [place];  Service: Cardiovascular;  Laterality: N/A;  . TONSILLECTOMY AND ADENOIDECTOMY      Social History   Socioeconomic History  . Marital status: Married    Spouse name: Not on file  . Number of children: 1  . Years of education: Not on file  . Highest education level: High school graduate  Occupational History  . Not on file  Social Needs  . Financial resource strain: Not on file  . Food insecurity:    Worry: Not on file    Inability: Not on file  . Transportation needs:    Medical: Not on file    Non-medical: Not on file  Tobacco Use  . Smoking status: Former Smoker    Packs/day: 1.00    Types: Cigarettes    Last attempt to quit: 1990    Years since quitting: 29.9  .  Smokeless tobacco: Former Systems developer    Types: Chew    Quit date: 108  . Tobacco comment: smoked [place] with 12 year abstinence ( 21 total years of smoking), up to 1 ppd  Substance and Sexual Activity  . Alcohol use: Not Currently    Comment: 09/26/18 NONE; previous 4 shots of alcohol a few days a week  . Drug use: Never  . Sexual activity: Not on file  Lifestyle  . Physical activity:    Days per week: Not on file    Minutes per session: Not on file  . Stress: Not on file  Relationships  . Social connections:    Talks on phone: Not on file  Gets together: Not on file    Attends religious service: Not on file    Active member of club or organization: Not on file    Attends meetings of clubs or organizations: Not on file    Relationship status: Not on file  Other Topics Concern  . Not on file  Social History Narrative   Lives at home with his wife   Works in his garden and walks 4 days per week   Right handed   Caffeine: minimal    Family History  Problem Relation Age of Onset  . Heart attack Father 60  . Diabetes Mother   . Transient ischemic attack Mother 38  . Diabetes Sister   . Heart attack Sister 81  . Stomach cancer Maternal Grandfather   . Cirrhosis Paternal Uncle        alcoholic  . Alzheimer's disease Brother   . Colon cancer Neg Hx   . Colon polyps Neg Hx     Review of Systems  Constitutional: Negative for chills and fever.  HENT: Positive for congestion (chronic - no change). Negative for sinus pain and sore throat.   Respiratory: Positive for cough (dry ). Negative for shortness of breath and wheezing.   Cardiovascular: Negative for chest pain, palpitations and leg swelling.  Neurological: Negative for dizziness, light-headedness and headaches.       Objective:   Vitals:   10/31/18 1058  BP: 134/64  Pulse: (!) 52  Resp: 16  Temp: 98.2 F (36.8 C)  SpO2: 98%   BP Readings from Last 3 Encounters:  10/31/18 134/64  10/29/18 108/60  10/03/18  118/66   Wt Readings from Last 3 Encounters:  10/31/18 201 lb 12.8 oz (91.5 kg)  10/29/18 202 lb (91.6 kg)  10/03/18 202 lb (91.6 kg)   Body mass index is 27.37 kg/m.   Physical Exam    Constitutional: Appears well-developed and well-nourished. No distress.  HENT:  Head: Normocephalic and atraumatic.  Neck: Bilateral ear canals and tympanic membranes normal.  No oropharynx erythema.  Neck supple. No tracheal deviation present. No thyromegaly present.  No cervical lymphadenopathy Cardiovascular: Normal rate, regular rhythm and normal heart sounds.   No murmur heard. No carotid bruit .  No edema Pulmonary/Chest: Effort normal and breath sounds normal. No respiratory distress. No has no wheezes. No rales.  Skin: Skin is warm and dry. Not diaphoretic.  Psychiatric: Normal mood and affect. Behavior is normal.      Assessment & Plan:    See Problem List for Assessment and Plan of chronic medical problems.

## 2018-10-30 NOTE — Progress Notes (Signed)
Subjective:    Patient ID: Sergio Mack, male    DOB: 02-20-1946, 72 y.o.   MRN: [id number]  HPI Sergio Mack is a pleasant 72 year old white male, known to Sergio Mack, who comes in today to discuss follow-up colonoscopy in setting of chronic anticoagulation with Eliquis. Patient last had colonoscopy in December 2016 and was found to have 6 polyps all 3 to 7 mm in size scattered throughout the colon, and moderate diverticulosis.  Path showed 3 of the polyps to be tubular adenomas, one was a sessile serrated polyp and the others were hyperplastic.  There was no high-grade dysplasia.  Patient has no current GI concerns, denies any problems with heartburn indigestion abdominal pain, changes in bowel habits melena or hematochezia. He has history of atrial fibrillation, has been on Tikosyn and says his atrial fibrillation has been controlled.  Also with history of GERD and hypertension. He is followed by Sergio Mack/cardiology.  Review of Systems Pertinent positive and negative review of systems were noted in the above HPI section.  All other review of systems was otherwise negative.  Outpatient Encounter Medications as of 10/29/2018  Medication Sig  . amLODipine (NORVASC) 5 MG tablet Take 2 tablets (10 mg total) by mouth daily.  Marland Kitchen apixaban (ELIQUIS) 5 MG TABS tablet Take 1 tablet (5 mg total) by mouth 2 (two) times daily.  . cholecalciferol (VITAMIN D) 1000 units tablet Take 1,000 Units by mouth daily.  . Cyanocobalamin (VITAMIN B-12) 5000 MCG SUBL Place 5,000 mcg under the tongue daily.  Marland Kitchen dofetilide (TIKOSYN) 500 MCG capsule Take 1 capsule (500 mcg total) by mouth 2 (two) times daily.  . furosemide (LASIX) 20 MG tablet Take 1 tablet (20 mg total) by mouth daily.  Marland Kitchen levETIRAcetam (KEPPRA) 500 MG tablet Take 1 tablet (500 mg total) by mouth 2 (two) times daily.  . metoprolol tartrate (LOPRESSOR) 100 MG tablet Take 1 tablet (100 mg total) by mouth 2 (two) times daily.  . Multiple Vitamins-Minerals  (ICAPS AREDS 2 PO) Take 1 capsule by mouth 2 (two) times daily.  . quinapril (ACCUPRIL) 40 MG tablet Take 1 tablet (40 mg total) by mouth every morning.  . sodium chloride (OCEAN) 0.65 % SOLN nasal spray Place 1 spray into both nostrils as needed for congestion.  . Zinc 50 MG CAPS Take 50 mg by mouth as needed.   . Na Sulfate-K Sulfate-Mg Sulf 17.5-3.13-1.6 GM/177ML SOLN Take as directed for colonoscopy.  . [DISCONTINUED] doxycycline (VIBRA-TABS) 100 MG tablet Take 1 tablet (100 mg total) by mouth 2 (two) times daily.   No facility-administered encounter medications on file as of 10/29/2018.    Allergies  Allergen Reactions  . Glucosamine Itching and Other (See Comments)    Itching feet and palms, tightness in chest also  . Shellfish-Derived Products Anaphylaxis, Shortness Of Breath and Itching    Itching feet and palms, tightness in chest also  Can eat oysters, can do dyes for tests and can eat just a few shrimp   Patient Active Problem List   Diagnosis Date Noted  . Alcohol withdrawal seizure ([place]) 06/26/2018  . Alcohol abuse 06/25/2018  . Hypokalemia 06/25/2018  . Leukocytosis 06/25/2018  . Head trauma 04/20/2018  . Abrasion 04/20/2018  . Rib pain on left side 04/20/2018  . Persistent atrial fibrillation 12/19/2017  . HFrEF (heart failure with reduced ejection fraction) ([place]) 12/04/2017  . Acute sinus infection 11/16/2017  . Memory difficulties 10/26/2017  . Cramping of feet 04/24/2017  . Headache 04/24/2017  .  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 Sensorineural hearing loss (SNHL), 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  . Prediabetes 04/07/2016  . Full thickness rotator cuff tear 04/23/2014  . Anticoagulation adequate, new on eliquis 03/07/2014  . SKIN CANCER, HX OF 03/24/2010  . DIVERTICULOSIS, COLON 12/11/2008  . SPINAL STENOSIS 01/14/2008  . BACK PAIN, LUMBAR, WITH RADICULOPATHY 01/02/2008  . Essential hypertension 10/31/2007  . HYPERPLASIA PROSTATE UNS W/O UR OBST & OTH LUTS  10/31/2007  . GERD 09/27/2007  . Localized swelling, mass, and lump of head 09/27/2007   Social History   Socioeconomic History  . Marital status: Married    Spouse name: Not on file  . Number of children: 1  . Years of education: Not on file  . Highest education level: High school graduate  Occupational History  . Not on file  Social Needs  . Financial resource strain: Not on file  . Food insecurity:    Worry: Not on file    Inability: Not on file  . Transportation needs:    Medical: Not on file    Non-medical: Not on file  Tobacco Use  . Smoking status: Former Smoker    Packs/day: 1.00    Types: Cigarettes    Last attempt to quit: 1990    Years since quitting: 29.9  . Smokeless tobacco: Former Systems developer    Types: Chew    Quit date: 39  . Tobacco comment: smoked [place] with 12 year abstinence ( 21 total years of smoking), up to 1 ppd  Substance and Sexual Activity  . Alcohol use: Not Currently    Comment: 09/26/18 NONE; previous 4 shots of alcohol a few days a week  . Drug use: Never  . Sexual activity: Not on file  Lifestyle  . Physical activity:    Days per week: Not on file    Minutes per session: Not on file  . Stress: Not on file  Relationships  . Social connections:    Talks on phone: Not on file    Gets together: Not on file    Attends religious service: Not on file    Active member of club or organization: Not on file    Attends meetings of clubs or organizations: Not on file    Relationship status: Not on file  . Intimate partner violence:    Fear of current or ex partner: Not on file    Emotionally abused: Not on file    Physically abused: Not on file    Forced sexual activity: Not on file  Other Topics Concern  . Not on file  Social History Narrative   Lives at home with his wife   Works in his garden and walks 4 days per week   Right handed   Caffeine: minimal    Mr. Sergio Mack's family history includes Alzheimer's disease in his brother;  Cirrhosis in his paternal uncle; Diabetes in his mother and sister; Heart attack (age of onset: 49) in his sister; Heart attack (age of onset: 6) in his father; Stomach cancer in his maternal grandfather; Transient ischemic attack (age of onset: 31) in his mother.      Objective:    Vitals:   10/29/18 1002  BP: 108/60  Pulse: 62    Physical Exam; well-developed older white male in no acute distress, pleasant accompanied by his wife, BMI 27.4.  HEENT; nontraumatic normocephalic EOMI PERRLA sclera anicteric oral mucosa moist, Cardiovascular ;regular rate and rhythm with S1-S2, Pulmonary; clear bilaterally, Abdomen, soft nontender nondistended bowel  sounds are active there is no palpable mass or hepatosplenomegaly.  Rectal ;exam not done, Extremities; no clubbing cyanosis or edema skin warm and dry, Neuropsych; alert and oriented, grossly nonfocal mood and affect appropriate       Assessment & Plan:   #29 72 year old white male with history of multiple tubular adenomas and a sessile serrated polyp on colonoscopy done 3 years ago, who is due for follow-up colonoscopy He is currently asymptomatic 2 chronic anticoagulation-on Eliquis 3 atrial fibrillation 4.  Hypertension 5.  GERD  Plan;Patient will be scheduled for colonoscopy with Sergio Mack in the [facility].  Procedure was discussed in detail with patient including indications risks and benefits and he is agreeable to proceed. Eliquis will need to be held prior to colonoscopy.  Rationale for holding anticoagulation was discussed with the patient and his wife.  We will plan to hold Eliquis for 24 hours prior to colonoscopy, and we will communicate with his cardiologist/Sergio Mack to assure that this is reasonable for this patient.   S  PA-C 10/30/2018   Cc: Binnie Rail, MD

## 2018-10-31 ENCOUNTER — Ambulatory Visit (INDEPENDENT_AMBULATORY_CARE_PROVIDER_SITE_OTHER): Payer: Medicare Other | Admitting: Internal Medicine

## 2018-10-31 ENCOUNTER — Encounter: Payer: Self-pay | Admitting: Internal Medicine

## 2018-10-31 VITALS — BP 134/64 | HR 52 | Temp 98.2°F | Resp 16 | Ht 72.0 in | Wt 201.8 lb

## 2018-10-31 DIAGNOSIS — I10 Essential (primary) hypertension: Secondary | ICD-10-CM | POA: Diagnosis not present

## 2018-10-31 DIAGNOSIS — I4819 Other persistent atrial fibrillation: Secondary | ICD-10-CM

## 2018-10-31 DIAGNOSIS — R05 Cough: Secondary | ICD-10-CM | POA: Diagnosis not present

## 2018-10-31 DIAGNOSIS — R972 Elevated prostate specific antigen [PSA]: Secondary | ICD-10-CM | POA: Diagnosis not present

## 2018-10-31 DIAGNOSIS — R059 Cough, unspecified: Secondary | ICD-10-CM

## 2018-10-31 DIAGNOSIS — R7303 Prediabetes: Secondary | ICD-10-CM | POA: Diagnosis not present

## 2018-10-31 DIAGNOSIS — E871 Hypo-osmolality and hyponatremia: Secondary | ICD-10-CM

## 2018-10-31 NOTE — Assessment & Plan Note (Signed)
A1c 6.0% Continue low-sodium/carbohydrate diet Continue regular walking Recheck in 6 months

## 2018-10-31 NOTE — Assessment & Plan Note (Signed)
He is experiencing a dry cough, no other cold symptoms Symptomatic treatment at this time He will let me know if this advances into something more significant

## 2018-10-31 NOTE — Assessment & Plan Note (Signed)
Has had chronic, intermittent hyponatremia-relatively mild in nature Sodium slightly lower with most recent blood work No obvious cause We will just monitor at this time

## 2018-10-31 NOTE — Assessment & Plan Note (Signed)
BP well controlled Current regimen effective and well tolerated Continue current medications at current doses Recent CMP showed normal kidney function

## 2018-10-31 NOTE — Assessment & Plan Note (Signed)
In sinus rhythm here today, asymptomatic, rate controlled Has follow-up with cardiology CBC normal

## 2018-11-05 NOTE — Telephone Encounter (Signed)
Pt takes Eliquis for afib with CHADS2VASc score of 3 (age, CHF, HTN). Renal function is normal. Ok to hold Eliquis 1-2 days prior to procedure as requested.

## 2018-11-05 NOTE — Telephone Encounter (Signed)
Pharm please address 

## 2018-11-06 NOTE — Telephone Encounter (Signed)
Called and spoke to the patient's wife For the colonoscopy on 11-26-17, (. He was not at home.)  I advised he can hold the Eliquis  On 11-25-17 and the morning of 11-26-17.  I advised Dr. Silverio Decamp will tell him when to restart the Eliquis.

## 2018-11-07 DIAGNOSIS — N400 Enlarged prostate without lower urinary tract symptoms: Secondary | ICD-10-CM | POA: Diagnosis not present

## 2018-11-12 ENCOUNTER — Ambulatory Visit (HOSPITAL_COMMUNITY)
Admission: RE | Admit: 2018-11-12 | Discharge: 2018-11-12 | Disposition: A | Discharge status: Home / Self Care | Payer: Medicare Other | Source: Ambulatory Visit | Attending: Nurse Practitioner | Admitting: Nurse Practitioner

## 2018-11-12 ENCOUNTER — Encounter (HOSPITAL_COMMUNITY): Payer: Self-pay | Admitting: Nurse Practitioner

## 2018-11-12 VITALS — BP 132/70 | HR 56 | Ht 72.0 in | Wt 196.0 lb

## 2018-11-12 DIAGNOSIS — I10 Essential (primary) hypertension: Secondary | ICD-10-CM | POA: Diagnosis not present

## 2018-11-12 DIAGNOSIS — Z91013 Allergy to seafood: Secondary | ICD-10-CM | POA: Diagnosis not present

## 2018-11-12 DIAGNOSIS — I4819 Other persistent atrial fibrillation: Secondary | ICD-10-CM | POA: Diagnosis not present

## 2018-11-12 DIAGNOSIS — I4510 Unspecified right bundle-branch block: Secondary | ICD-10-CM | POA: Insufficient documentation

## 2018-11-12 DIAGNOSIS — Z886 Allergy status to analgesic agent status: Secondary | ICD-10-CM | POA: Insufficient documentation

## 2018-11-12 DIAGNOSIS — Z833 Family history of diabetes mellitus: Secondary | ICD-10-CM | POA: Insufficient documentation

## 2018-11-12 DIAGNOSIS — R9431 Abnormal electrocardiogram [ECG] [EKG]: Secondary | ICD-10-CM | POA: Insufficient documentation

## 2018-11-12 DIAGNOSIS — Z79899 Other long term (current) drug therapy: Secondary | ICD-10-CM | POA: Diagnosis not present

## 2018-11-12 DIAGNOSIS — Z8249 Family history of ischemic heart disease and other diseases of the circulatory system: Secondary | ICD-10-CM | POA: Insufficient documentation

## 2018-11-12 DIAGNOSIS — Z87891 Personal history of nicotine dependence: Secondary | ICD-10-CM | POA: Diagnosis not present

## 2018-11-12 DIAGNOSIS — I491 Atrial premature depolarization: Secondary | ICD-10-CM | POA: Diagnosis not present

## 2018-11-12 LAB — MAGNESIUM: Magnesium: 2 mg/dL (ref 1.7–2.4)

## 2018-11-12 MED ORDER — APIXABAN 5 MG PO TABS: 5.0000 mg | ORAL_TABLET | Freq: Two times a day (BID) | ORAL | 3 refills | Status: DC
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
## 2018-11-26 ENCOUNTER — Ambulatory Visit (AMBULATORY_SURGERY_CENTER): Payer: Medicare Other | Admitting: Gastroenterology

## 2018-11-26 ENCOUNTER — Encounter: Payer: Self-pay | Admitting: Gastroenterology

## 2018-11-26 VITALS — BP 114/66 | HR 57 | Temp 97.9°F | Resp 18 | Ht 72.0 in | Wt 202.0 lb

## 2018-11-26 DIAGNOSIS — Z86010 Personal history of colonic polyps: Secondary | ICD-10-CM | POA: Diagnosis not present

## 2018-11-26 DIAGNOSIS — K635 Polyp of colon: Secondary | ICD-10-CM

## 2018-11-26 DIAGNOSIS — D125 Benign neoplasm of sigmoid colon: Secondary | ICD-10-CM

## 2018-11-26 DIAGNOSIS — R569 Unspecified convulsions: Secondary | ICD-10-CM | POA: Diagnosis not present

## 2018-11-26 DIAGNOSIS — D120 Benign neoplasm of cecum: Secondary | ICD-10-CM

## 2018-11-26 DIAGNOSIS — I4891 Unspecified atrial fibrillation: Secondary | ICD-10-CM | POA: Diagnosis not present

## 2018-11-26 DIAGNOSIS — I10 Essential (primary) hypertension: Secondary | ICD-10-CM | POA: Diagnosis not present

## 2018-11-26 DIAGNOSIS — E669 Obesity, unspecified: Secondary | ICD-10-CM | POA: Diagnosis not present

## 2018-11-26 MED ORDER — SODIUM CHLORIDE 0.9 % IV SOLN: 500.0000 mL | Freq: Once | INTRAVENOUS | Status: DC

## 2018-11-26 NOTE — Progress Notes (Signed)
Pt's states no medical or surgical changes since previsit or office visit. 

## 2018-11-26 NOTE — Progress Notes (Signed)
Report to PACU, RN, vss, BBS= Clear.  

## 2018-11-26 NOTE — Progress Notes (Signed)
Called to room to assist during endoscopic procedure.  Patient ID and intended procedure confirmed with present staff. Received instructions for my participation in the procedure from the performing physician.  

## 2018-11-26 NOTE — Op Note (Addendum)
[place] Patient Name: Sergio Mack Procedure Date: 11/26/2018 7:52 AM MRN: [id number] Endoscopist: Mauri Pole , MD Age: 73 Referring MD:  Date of Birth: 1946-10-06 Gender: Male Account #: [id number] Procedure:                Colonoscopy Indications:              High risk colon cancer surveillance: Personal                            history of colonic polyps, Surveillance: Personal                            history of adenomatous polyps on last colonoscopy 3                            years ago, High risk colon cancer surveillance:                            Personal history of multiple (3 or more) adenomas Medicines:                Monitored Anesthesia Care Procedure:                Pre-Anesthesia Assessment:                           - Prior to the procedure, a History and Physical                            was performed, and patient medications and                            allergies were reviewed. The patient's tolerance of                            previous anesthesia was also reviewed. The risks                            and benefits of the procedure and the sedation                            options and risks were discussed with the patient.                            All questions were answered, and informed consent                            was obtained. Prior Anticoagulants: The patient                            last took Eliquis (apixaban) 2 days prior to the                            procedure. ASA Grade Assessment: III - A patient  with severe systemic disease. After reviewing the                            risks and benefits, the patient was deemed in                            satisfactory condition to undergo the procedure.                           After obtaining informed consent, the colonoscope                            was passed under direct vision. Throughout the                            procedure,  the patient's blood pressure, pulse, and                            oxygen saturations were monitored continuously. The                            Colonoscope was introduced through the anus and                            advanced to the the cecum, identified by                            appendiceal orifice and ileocecal valve. The                            colonoscopy was performed without difficulty. The                            patient tolerated the procedure well. The quality                            of the bowel preparation was excellent. The                            ileocecal valve, appendiceal orifice, and rectum                            were photographed. Scope In: 8:09:31 AM Scope Out: 8:24:15 AM Scope Withdrawal Time: 0 hours 11 minutes 59 seconds  Total Procedure Duration: 0 hours 14 minutes 44 seconds  Findings:                 The perianal and digital rectal examinations were                            normal.                           A 1 mm polyp was found in the cecum. The polyp was  sessile. The polyp was removed with a cold biopsy                            forceps. Resection and retrieval were complete.                           A 4 mm polyp was found in the sigmoid colon. The                            polyp was sessile. The polyp was removed with a                            cold snare. Resection and retrieval were complete.                           Scattered small and large-mouthed diverticula were                            found in the sigmoid colon, descending colon,                            transverse colon, ascending colon and cecum.                           Non-bleeding internal hemorrhoids were found during                            retroflexion. The hemorrhoids were small. Complications:            No immediate complications. Estimated Blood Loss:     Estimated blood loss was minimal. Impression:               - One 1  mm polyp in the cecum, removed with a cold                            biopsy forceps. Resected and retrieved.                           - One 4 mm polyp in the sigmoid colon, removed with                            a cold snare. Resected and retrieved.                           - Moderate diverticulosis in the sigmoid colon, in                            the descending colon, in the transverse colon, in                            the ascending colon and in the cecum.                           - Non-bleeding  internal hemorrhoids. Recommendation:           - Patient has a contact number available for                            emergencies. The signs and symptoms of potential                            delayed complications were discussed with the                            patient. Return to normal activities tomorrow.                            Written discharge instructions were provided to the                            patient.                           - Resume previous diet.                           - Continue present medications.                           - Resume Eliquis (apixaban) at prior dose tomorrow.                            Refer to managing physician for further adjustment                            of therapy.                           - Await pathology results.                           - Repeat colonoscopy in 5 years for surveillance                            based on pathology results. Mauri Pole, MD 11/26/2018 8:33:56 AM This report has been signed electronically.

## 2018-11-26 NOTE — Patient Instructions (Signed)
YOU HAD AN ENDOSCOPIC PROCEDURE TODAY AT THE [facility]:   Refer to the procedure report that was given to you for any specific questions about what was found during the examination.  If the procedure report does not answer your questions, please call your gastroenterologist to clarify.  If you requested that your care partner not be given the details of your procedure findings, then the procedure report has been included in a sealed envelope for you to review at your convenience later.  YOU SHOULD EXPECT: Some feelings of bloating in the abdomen. Passage of more gas than usual.  Walking can help get rid of the air that was put into your GI tract during the procedure and reduce the bloating. If you had a lower endoscopy (such as a colonoscopy or flexible sigmoidoscopy) you may notice spotting of blood in your stool or on the toilet paper. If you underwent a bowel prep for your procedure, you may not have a normal bowel movement for a few days.  Please Note:  You might notice some irritation and congestion in your nose or some drainage.  This is from the oxygen used during your procedure.  There is no need for concern and it should clear up in a day or so.  SYMPTOMS TO REPORT IMMEDIATELY:   Following lower endoscopy (colonoscopy or flexible sigmoidoscopy):  Excessive amounts of blood in the stool  Significant tenderness or worsening of abdominal pains  Swelling of the abdomen that is new, acute  Fever of 100F or higher  For urgent or emergent issues, a gastroenterologist can be reached at any hour by calling [phone].   DIET:  We do recommend a small meal at first, but then you may proceed to your regular diet.  Drink plenty of fluids but you should avoid alcoholic beverages for 24 hours.  ACTIVITY:  You should plan to take it easy for the rest of today and you should NOT DRIVE or use heavy machinery until tomorrow (because of the sedation medicines used during the test).     FOLLOW UP: Our staff will call the number listed on your records the next business day following your procedure to check on you and address any questions or concerns that you may have regarding the information given to you following your procedure. If we do not reach you, we will leave a message.  However, if you are feeling well and you are not experiencing any problems, there is no need to return our call.  We will assume that you have returned to your regular daily activities without incident.  If any biopsies were taken you will be contacted by phone or by letter within the next 1-3 weeks.  Please call us at [phone] if you have not heard about the biopsies in 3 weeks.    SIGNATURES/CONFIDENTIALITY: You and/or your care partner have signed paperwork which will be entered into your electronic medical record.  These signatures attest to the fact that that the information above on your After Visit Summary has been reviewed and is understood.  Full responsibility of the confidentiality of this discharge information lies with you and/or your care-partner. 

## 2018-11-27 ENCOUNTER — Telehealth: Payer: Self-pay | Admitting: *Deleted

## 2018-11-27 NOTE — Telephone Encounter (Signed)
  Follow up Call-  Call back number 11/26/2018  Post procedure Call Back phone  # [phone]  Permission to leave phone message Yes  Some recent data might be hidden     Patient questions:  Do you have a fever, pain , or abdominal swelling? No. Pain Score  0 *  Have you tolerated food without any problems? Yes.    Have you been able to return to your normal activities? Yes.    Do you have any questions about your discharge instructions: Diet   No. Medications  No. Follow up visit  No.  Do you have questions or concerns about your Care? No.  Actions: * If pain score is 4 or above: No action needed, pain <4.

## 2018-11-29 NOTE — Progress Notes (Signed)
Reviewed and agree with documentation and assessment and plan. K. Veena Mercer Stallworth , MD   

## 2018-12-03 DIAGNOSIS — H2512 Age-related nuclear cataract, left eye: Secondary | ICD-10-CM | POA: Diagnosis not present

## 2018-12-03 DIAGNOSIS — H52202 Unspecified astigmatism, left eye: Secondary | ICD-10-CM | POA: Diagnosis not present

## 2018-12-04 DIAGNOSIS — H2511 Age-related nuclear cataract, right eye: Secondary | ICD-10-CM | POA: Diagnosis not present

## 2018-12-05 ENCOUNTER — Encounter: Payer: Self-pay | Admitting: Gastroenterology

## 2018-12-13 NOTE — Progress Notes (Signed)
Subjective:    Patient ID: Sergio Mack, male    DOB: 15-Jun-1946, 73 y.o.   MRN: [id number]  HPI He is here for an acute visit for cold symptoms.  His symptoms started 3 weeks ago  He is experiencing decreased appetite, nasal congestion, postnasal drip, runny nose, sore throat and productive cough of discolored mucus.  He was concerned because the symptoms are not resolving and it has been 3 weeks.  He denies any obvious fevers, ear pain, shortness of breath, wheezing, GI symptoms, body aches and headaches.  He has tried taking saline nasal spray and cold medication   Medications and allergies reviewed with patient and updated if appropriate.  Patient Active Problem List   Diagnosis Date Noted  . Cough 10/31/2018  . Hyponatremia 10/30/2018  . Alcohol withdrawal seizure ([place]) 06/26/2018  . History of alcohol abuse 06/25/2018  . Leukocytosis 06/25/2018  . Head trauma 04/20/2018  . Persistent atrial fibrillation 12/19/2017  . HFrEF (heart failure with reduced ejection fraction) ([place]) 12/04/2017  . Memory difficulties 10/26/2017  . 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 Sensorineural hearing loss (SNHL), 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  . Prediabetes 04/07/2016  . Full thickness rotator cuff tear 04/23/2014  . Anticoagulation adequate, new on eliquis 03/07/2014  . SKIN CANCER, HX OF 03/24/2010  . DIVERTICULOSIS, COLON 12/11/2008  . SPINAL STENOSIS 01/14/2008  . BACK PAIN, LUMBAR, WITH RADICULOPATHY 01/02/2008  . Essential hypertension 10/31/2007  . 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 GERD 09/27/2007    Current Outpatient Medications on File Prior to Visit  Medication Sig Dispense Refill  . amLODipine (NORVASC) 5 MG tablet Take 2 tablets (10 mg total) by mouth daily. 90 tablet 3  . apixaban (ELIQUIS) 5 MG TABS tablet Take 1 tablet (5 mg total) by mouth 2 (two) times daily. 180 tablet 3  . dofetilide (TIKOSYN) 500 MCG capsule Take 1 capsule (500 mcg  total) by mouth 2 (two) times daily. 180 capsule 2  . furosemide (LASIX) 20 MG tablet Take 1 tablet (20 mg total) by mouth daily. 90 tablet 2  . levETIRAcetam (KEPPRA) 500 MG tablet Take 1 tablet (500 mg total) by mouth 2 (two) times daily. 180 tablet 4  . metoprolol tartrate (LOPRESSOR) 100 MG tablet Take 1 tablet (100 mg total) by mouth 2 (two) times daily. 180 tablet 2  . Multiple Vitamins-Minerals (ICAPS AREDS 2 PO) Take 1 capsule by mouth 2 (two) times daily.    . quinapril (ACCUPRIL) 40 MG tablet Take 1 tablet (40 mg total) by mouth every morning. 90 tablet 3  . sodium chloride (OCEAN) 0.65 % SOLN nasal spray Place 1 spray into both nostrils as needed for congestion.    . Zinc 50 MG CAPS Take 50 mg by mouth as needed.     . cholecalciferol (VITAMIN D) 1000 units tablet Take 1,000 Units by mouth daily.    . Cyanocobalamin (VITAMIN B-12) 5000 MCG SUBL Place 5,000 mcg under the tongue daily.     No current facility-administered medications on file prior to visit.     Past Medical History:  Diagnosis Date  . Diverticulosis 2006  . Erectile dysfunction   . GERD (gastroesophageal reflux disease)   . History of skin cancer   . History of syncope 1994   no etiology; no recurrence  . Hyperlipidemia    LDL goal = < 100, ideally < 70  . Hypertension   . Persistent atrial fibrillation 03/06/2014  . PVC's (  premature ventricular contractions)   . Rotator cuff tear   . Visit for monitoring Tikosyn therapy 12/19/2017    Past Surgical History:  Procedure Laterality Date  . CARDIOVERSION N/A 12/05/2017   Procedure: CARDIOVERSION;  Surgeon: Dorothy Spark, MD;  Location: [facility] ENDOSCOPY;  Service: Cardiovascular;  Laterality: N/A;  . COLONOSCOPY  2006   Diverticulosis  . CYSTECTOMY     lip  . CYSTECTOMY     anterior thorax-chest  . INGUINAL HERNIA REPAIR      x 2  . 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 SHOULDER OPEN ROTATOR CUFF REPAIR Right 04/23/2014   Procedure: RIGHT SHOULDER ROTATOR CUFF REPAIR WITH GRAFT AND ANCHORS . OPEN ACROMIONECTOMY;  Surgeon: Tobi Bastos, MD;  Location: [facility] ORS;  Service: Orthopedics;  Laterality: Right;  . SPINE SURGERY  2012   Dr Gladstone Lighter  . TEE WITHOUT CARDIOVERSION N/A 12/05/2017   Procedure: TRANSESOPHAGEAL ECHOCARDIOGRAM (TEE);  Surgeon: Dorothy Spark, MD;  Location: [facility];  Service: Cardiovascular;  Laterality: N/A;  . TONSILLECTOMY AND ADENOIDECTOMY      Social History   Socioeconomic History  . Marital status: Married    Spouse name: Not on file  . Number of children: 1  . Years of education: Not on file  . Highest education level: High school graduate  Occupational History  . Not on file  Social Needs  . Financial resource strain: Not on file  . Food insecurity:    Worry: Not on file    Inability: Not on file  . Transportation needs:    Medical: Not on file    Non-medical: Not on file  Tobacco Use  . Smoking status: Former Smoker    Packs/day: 1.00    Types: Cigarettes    Last attempt to quit: 1990    Years since quitting: 30.0  . Smokeless tobacco: Former Systems developer    Types: Chew    Quit date: 34  . Tobacco comment: smoked [place] with 12 year abstinence ( 21 total years of smoking), up to 1 ppd  Substance and Sexual Activity  . Alcohol use: Not Currently    Comment: 09/26/18 NONE; previous 4 shots of alcohol a few days a week  . Drug use: Never  . Sexual activity: Not on file  Lifestyle  . Physical activity:    Days per week: Not on file    Minutes per session: Not on file  . Stress: Not on file  Relationships  . Social connections:    Talks on phone: Not on file    Gets together: Not on file    Attends religious service: Not on file    Active member of club or organization: Not on file    Attends meetings of clubs or organizations: Not on  file    Relationship status: Not on file  Other Topics Concern  . Not on file  Social History Narrative   Lives at home with his wife   Works in his garden and walks 4 days per week   Right handed   Caffeine: minimal    Family History  Problem Relation Age of Onset  . Heart attack Father 38  . Diabetes Mother   . Transient ischemic attack Mother 55  . Diabetes Sister   . Heart attack Sister 90  . Stomach cancer Maternal Grandfather   .  Cirrhosis Paternal Uncle        alcoholic  . Alzheimer's disease Brother   . Colon cancer Neg Hx   . Colon polyps Neg Hx     Review of Systems  Constitutional: Positive for appetite change (dec). Negative for chills and fever.  HENT: Positive for congestion, postnasal drip, rhinorrhea and sore throat. Negative for ear pain.   Respiratory: Positive for cough (productive - discolored). Negative for chest tightness, shortness of breath and wheezing.   Gastrointestinal: Negative for diarrhea and nausea.  Musculoskeletal: Negative for myalgias.  Neurological: Negative for light-headedness and headaches.       Objective:   Vitals:   12/14/18 0755  BP: (!) 154/72  Pulse: 60  Resp: 18  Temp: 98.1 F (36.7 C)  SpO2: 98%   Filed Weights   12/14/18 0755  Weight: 186 lb 12.8 oz (84.7 kg)   Body mass index is 25.33 kg/m.  Wt Readings from Last 3 Encounters:  12/14/18 186 lb 12.8 oz (84.7 kg)  11/26/18 202 lb (91.6 kg)  11/12/18 196 lb (88.9 kg)     Physical Exam GENERAL APPEARANCE: Appears stated age, well appearing, NAD EYES: conjunctiva clear, no icterus HEENT: bilateral tympanic membranes and ear canals normal, oropharynx with mild erythema, no thyromegaly, trachea midline, no cervical or supraclavicular lymphadenopathy LUNGS: unlabored breathing, good air entry bilaterally, slight wheeze/crackles left lower base, right lung clear CARDIOVASCULAR: Normal S1,S2 without murmurs, no edema SKIN: warm, dry        Assessment &  Plan:   See Problem List for Assessment and Plan of chronic medical problems.

## 2018-12-14 ENCOUNTER — Ambulatory Visit (INDEPENDENT_AMBULATORY_CARE_PROVIDER_SITE_OTHER): Payer: Medicare Other | Admitting: Internal Medicine

## 2018-12-14 ENCOUNTER — Encounter: Payer: Self-pay | Admitting: Internal Medicine

## 2018-12-14 VITALS — BP 154/72 | HR 60 | Temp 98.1°F | Resp 18 | Ht 72.0 in | Wt 186.8 lb

## 2018-12-14 DIAGNOSIS — J069 Acute upper respiratory infection, unspecified: Secondary | ICD-10-CM | POA: Insufficient documentation

## 2018-12-14 MED ORDER — HYDROCODONE-HOMATROPINE 5-1.5 MG/5ML PO SYRP: 5.0000 mL | ORAL_SOLUTION | Freq: Three times a day (TID) | ORAL | 0 refills | Status: DC | PRN

## 2018-12-14 MED ORDER — AMOXICILLIN-POT CLAVULANATE 875-125 MG PO TABS: 1.0000 | ORAL_TABLET | Freq: Two times a day (BID) | ORAL | 0 refills | Status: DC

## 2018-12-14 NOTE — Assessment & Plan Note (Signed)
Concern for bacterial cause Doubt pneumonia given good oxygenation, lack of fever, but he is having a productive cough with discolored sputum and has some crackles/wheeze on the left side therefore I think is a good idea starting biotic especially given duration of symptoms Start Augmentin twice daily x10 days Hycodan cough syrup as needed Continue saline nasal spray and over-the-counter cold medications Rest, fluids Call if no improvement

## 2018-12-14 NOTE — Patient Instructions (Addendum)
Start the antibiotic and take the cough syrup as needed.    Your prescription(s) have been submitted to your pharmacy or been printed and provided for you. Please take as directed and contact our office if you believe you are having problem(s) with the medication(s) or have any questions.  If your symptoms worsen or fail to improve, please contact our office for further instruction, or in case of emergency go directly to the emergency room at the closest medical facility.   General Recommendations:    Please drink plenty of fluids.  Get plenty of rest   Sleep in humidified air  Use saline nasal sprays  Netti pot  OTC Medications:  Decongestants - helps relieve congestion   Flonase (generic fluticasone) or Nasacort (generic triamcinolone) - please make sure to use the "cross-over" technique at a 45 degree angle towards the opposite eye as opposed to straight up the nasal passageway.   Sudafed (generic pseudoephedrine - Note this is the one that is available behind the pharmacy counter); Products with phenylephrine (-PE) may also be used but is often not as effective as pseudoephedrine.   If you have HIGH BLOOD PRESSURE - Coricidin HBP; AVOID any product that is -D as this contains pseudoephedrine which may increase your blood pressure.  Afrin (oxymetazoline) every 6-8 hours for up to 3 days.  Allergies - helps relieve runny nose, itchy eyes and sneezing   Claritin (generic loratidine), Allegra (fexofenidine), or Zyrtec (generic cyrterizine) for runny nose. These medications should not cause drowsiness.  Note - Benadryl (generic diphenhydramine) may be used however may cause drowsiness  Cough -   Delsym or Robitussin (generic dextromethorphan)  Expectorants - helps loosen mucus to ease removal   Mucinex (generic guaifenesin) as directed on the package.  Headaches / General Aches   Tylenol (generic acetaminophen) - DO NOT EXCEED 3 grams (3,000 mg) in a  24 hour time period  Advil/Motrin (generic ibuprofen)  Sore Throat -   Salt water gargle   Chloraseptic (generic benzocaine) spray or lozenges / Sucrets (generic dyclonine)

## 2018-12-19 DIAGNOSIS — L570 Actinic keratosis: Secondary | ICD-10-CM | POA: Diagnosis not present

## 2018-12-19 DIAGNOSIS — L819 Disorder of pigmentation, unspecified: Secondary | ICD-10-CM | POA: Diagnosis not present

## 2018-12-24 DIAGNOSIS — H52201 Unspecified astigmatism, right eye: Secondary | ICD-10-CM | POA: Diagnosis not present

## 2018-12-24 DIAGNOSIS — H2511 Age-related nuclear cataract, right eye: Secondary | ICD-10-CM | POA: Diagnosis not present

## 2018-12-25 DIAGNOSIS — H2512 Age-related nuclear cataract, left eye: Secondary | ICD-10-CM | POA: Diagnosis not present

## 2018-12-28 ENCOUNTER — Encounter: Payer: Self-pay | Admitting: Internal Medicine

## 2018-12-28 MED ORDER — DOXYCYCLINE HYCLATE 100 MG PO TABS: 100.0000 mg | ORAL_TABLET | Freq: Two times a day (BID) | ORAL | 0 refills | Status: DC
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
## 2019-02-05 ENCOUNTER — Other Ambulatory Visit (HOSPITAL_COMMUNITY): Payer: Self-pay | Admitting: Nurse Practitioner

## 2019-03-01 ENCOUNTER — Other Ambulatory Visit (HOSPITAL_COMMUNITY): Payer: Self-pay | Admitting: Nurse Practitioner

## 2019-03-18 ENCOUNTER — Other Ambulatory Visit: Payer: Self-pay

## 2019-03-18 ENCOUNTER — Encounter (HOSPITAL_COMMUNITY): Payer: Self-pay | Admitting: Nurse Practitioner

## 2019-03-18 ENCOUNTER — Ambulatory Visit (HOSPITAL_COMMUNITY)
Admission: RE | Admit: 2019-03-18 | Discharge: 2019-03-18 | Disposition: A | Discharge status: Home / Self Care | Payer: Medicare Other | Source: Ambulatory Visit | Attending: Nurse Practitioner | Admitting: Nurse Practitioner

## 2019-03-18 VITALS — BP 106/75 | HR 60 | Ht 72.0 in | Wt 166.2 lb

## 2019-03-18 DIAGNOSIS — I4819 Other persistent atrial fibrillation: Secondary | ICD-10-CM

## 2019-03-18 MED ORDER — AMLODIPINE BESYLATE 5 MG PO TABS: 2.5000 mg | ORAL_TABLET | Freq: Every day | ORAL | 3 refills | Status: DC

## 2019-03-18 NOTE — Progress Notes (Signed)
Electrophysiology TeleHealth Note   Due to national recommendations of social distancing due to [place] 19, Audio/video telehealth visit is felt to be most appropriate for this patient at this time.  See MyChart message/consent belowfrom today for patient consent regarding telehealth for the [facility].    Date:  03/18/2019   ID:  Sergio Mack, DOB Apr 19, 1946, MRN [id number]  Location: home  Provider location: [address] [place], [place] [address] Evaluation Performed: Follow up  PCP:  Binnie Rail, MD  Primary Cardiologist:  Primary Electrophysiologist: Dr. Rayann Heman   CC: afib   History of Present Illness: Sergio Mack is a 73 y.o. male who presents via audioconferencing for a telehealth visit today.   Pt reports being in SR with tikosyn. He has been on a weight loss program and has lost 25 lbs. He wants to lose a few more to get to goal. He has cut back on the amount of calories he was eating.He has noted that his BP has been running lower, someday's in the upper 80's. BP today 944 systolic. This has contributed to fatigue. He feels he is sleeping more, sad that he can't get out of house as he and his wife love to travel.   Today, he denies symptoms of palpitations, chest pain, shortness of breath, orthopnea, PND, lower extremity edema, claudication, dizziness, presyncope, syncope, bleeding, or neurologic sequela. + fatigue.The patient is tolerating medications without difficulties and is otherwise without complaint today.   he denies symptoms of cough, fevers, chills, or new SOB worrisome for COVID 19.      he has a BMI of Body mass index is 22.54 kg/m.Marland Kitchen Filed Weights   03/18/19 0936  Weight: 75.4 kg    Past Medical History:  Diagnosis Date  . Diverticulosis 2006  . Erectile dysfunction   . GERD (gastroesophageal reflux disease)   . History of skin cancer   . History of syncope 1994   no etiology; no recurrence  . Hyperlipidemia    LDL goal  = < 100, ideally < 70  . Hypertension   . Persistent atrial fibrillation 03/06/2014  . PVC's (premature ventricular contractions)   . Rotator cuff tear   . Visit for monitoring Tikosyn therapy 12/19/2017   Past Surgical History:  Procedure Laterality Date  . CARDIOVERSION N/A 12/05/2017   Procedure: CARDIOVERSION;  Surgeon: Dorothy Spark, MD;  Location: [facility] ENDOSCOPY;  Service: Cardiovascular;  Laterality: N/A;  . COLONOSCOPY  2006   Diverticulosis  . CYSTECTOMY     lip  . CYSTECTOMY     anterior thorax-chest  . INGUINAL HERNIA REPAIR      x 2  . 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 SHOULDER OPEN ROTATOR CUFF REPAIR Right 04/23/2014   Procedure: RIGHT SHOULDER ROTATOR CUFF REPAIR WITH GRAFT AND ANCHORS . OPEN ACROMIONECTOMY;  Surgeon: Tobi Bastos, MD;  Location: [facility] ORS;  Service: Orthopedics;  Laterality: Right;  . SPINE SURGERY  2012   Dr Gladstone Lighter  . TEE WITHOUT CARDIOVERSION N/A 12/05/2017   Procedure: TRANSESOPHAGEAL ECHOCARDIOGRAM (TEE);  Surgeon: Dorothy Spark, MD;  Location: [facility] ENDOSCOPY;  Service: Cardiovascular;  Laterality: N/A;  . TONSILLECTOMY AND ADENOIDECTOMY       Current Outpatient Medications  Medication Sig Dispense Refill  . amLODipine (NORVASC) 5 MG tablet Take 2 tablets (10 mg  total) by mouth daily. 90 tablet 3  . apixaban (ELIQUIS) 5 MG TABS tablet Take 1 tablet (5 mg total) by mouth 2 (two) times daily. 180 tablet 3  . dofetilide (TIKOSYN) 500 MCG capsule TAKE 1 CAPSULE TWICE DAILY 180 capsule 2  . furosemide (LASIX) 20 MG tablet TAKE 1 TABLET EVERY DAY 90 tablet 2  . levETIRAcetam (KEPPRA) 500 MG tablet Take 1 tablet (500 mg total) by mouth 2 (two) times daily. 180 tablet 4  . metoprolol tartrate (LOPRESSOR) 100 MG tablet Take 1 tablet (100 mg total) by mouth 2 (two) times daily. 180 tablet 2  .  Multiple Vitamins-Minerals (ICAPS AREDS 2 PO) Take 1 capsule by mouth 2 (two) times daily.    . quinapril (ACCUPRIL) 40 MG tablet Take 1 tablet (40 mg total) by mouth every morning. 90 tablet 3  . sodium chloride (OCEAN) 0.65 % SOLN nasal spray Place 1 spray into both nostrils as needed for congestion.    . cholecalciferol (VITAMIN D) 1000 units tablet Take 1,000 Units by mouth daily.    . Cyanocobalamin (VITAMIN B-12) 5000 MCG SUBL Place 5,000 mcg under the tongue daily.    . Zinc 50 MG CAPS Take 50 mg by mouth as needed.      No current facility-administered medications for this encounter.     Allergies:   Glucosamine and Shellfish-derived products   Social History:  The patient  reports that he quit smoking about 30 years ago. His smoking use included cigarettes. He smoked 1.00 pack per day. He quit smokeless tobacco use about 30 years ago.  His smokeless tobacco use included chew. He reports previous alcohol use. He reports that he does not use drugs.   Family History:  The patient's  family history includes Alzheimer's disease in his brother; Cirrhosis in his paternal uncle; Diabetes in his mother and sister; Heart attack (age of onset: 53) in his sister; Heart attack (age of onset: 53) in his father; Stomach cancer in his maternal grandfather; Transient ischemic attack (age of onset: 12) in his mother.    ROS:  Please see the history of present illness.   All other systems are personally reviewed and negative.   Exam: Well appearing, alert and conversant, regular work of breathing,  good skin color  Recent Labs: 10/24/2018: ALT 25; BUN 7; Creatinine, Ser 0.96; Hemoglobin 14.2; Platelets 278.0; Potassium 4.2; Sodium 129; TSH 1.55 11/12/2018: Magnesium 2.0  personally reviewed    Other studies personally reviewed: Epic records reviewed    ASSESSMENT AND PLAN:  1. paroxysmal  atrial fibrillation Doing well staying n SR continue tikosyn without change  He has a physical  appointment with PCP in June and can request tikosyn labs , bnmet/mag then as well as EKG and fax results to me He was congratulated on weight loss Continue on eliquis, no change, no bleeding issues  his patients CHA2DS2-VASc Score and unadjusted Ischemic Stroke Rate (% per year) is equal to 3.2 % stroke rate/year from a score of 3  Above score calculated as 1 point each if present [CHF, HTN, DM, Vascular=MI/PAD/Aortic Plaque, Age if 65-74, or Male] Above score calculated as 2 points each if present [Age > 75, or Stroke/TIA/TE]  2. Low blood pressures         COVID screen The patient does not have any symptoms that suggest any further testing/ screening at this time.  Social distancing reinforced today.    Follow-up:   3 months He will call the office  later in the week with BP readings.. if still soft, will stop amlodipne  Current medicines are reviewed at length with the patient today.   The patient  concerns regarding his medicines.  The following changes were made today:   Decrease 5 mg amlodipine to 2.5 mg daily  Labs/ tests ordered today include: none No orders of the defined types were placed in this encounter.   Patient Risk:  after full review of this patients clinical status, I feel that they are at  moderate risk at this time.   Today, I have spent  15 minutes with the patient with telehealth technology discussing above  Signed, Roderic Palau NP  03/18/2019 10:26 AM  [facility] [address] [place], [place] [address] [phone]   I hereby voluntarily request, consent and authorize the [facility] and its employed or contracted physicians, physician assistants, nurse practitioners or other licensed health care professionals (the Practitioner), to provide me with telemedicine health care services (the "Services") as deemed necessary by the treating Practitioner. I acknowledge and consent to receive the Services by the  Practitioner via telemedicine. I understand that the telemedicine visit will involve communicating with the Practitioner through live audiovisual communication technology and the disclosure of certain medical information by electronic transmission. I acknowledge that I have been given the opportunity to request an in-person assessment or other available alternative prior to the telemedicine visit and am voluntarily participating in the telemedicine visit.   I understand that I have the right to withhold or withdraw my consent to the use of telemedicine in the course of my care at any time, without affecting my right to future care or treatment, and that the Practitioner or I may terminate the telemedicine visit at any time. I understand that I have the right to inspect all information obtained and/or recorded in the course of the telemedicine visit and may receive copies of available information for a reasonable fee.  I understand that some of the potential risks of receiving the Services via telemedicine include:   Delay or interruption in medical evaluation due to technological equipment failure or disruption;  Information transmitted may not be sufficient (e.g. poor resolution of images) to allow for appropriate medical decision making by the Practitioner; and/or  In rare instances, security protocols could fail, causing a breach of personal health information.   Furthermore, I acknowledge that it is my responsibility to provide information about my medical history, conditions and care that is complete and accurate to the best of my ability. I acknowledge that Practitioner's advice, recommendations, and/or decision may be based on factors not within their control, such as incomplete or inaccurate data provided by me or distortions of diagnostic images or specimens that may result from electronic transmissions. I understand that the practice of medicine is not an exact science and that Practitioner makes no  warranties or guarantees regarding treatment outcomes. I acknowledge that I will receive a copy of this consent concurrently upon execution via email to the email address I last provided but may also request a printed copy by calling the office of the [facility].  I understand that my insurance will be billed for this visit.   I have read or had this consent read to me.  I understand the contents of this consent, which adequately explains the benefits and risks of the Services being provided via telemedicine.  I have been provided ample opportunity to ask questions regarding this consent and the  Services and have had my questions answered to my satisfaction.  I give my informed consent for the services to be provided through the use of telemedicine in my medical care  By participating in this telemedicine visit I agree to the above.

## 2019-03-19 MED ORDER — EPHEDRINE 5 MG/ML INJ
INTRAVENOUS | Status: AC
  Filled 2019-03-19: qty 10

## 2019-03-19 MED ORDER — SUCCINYLCHOLINE CHLORIDE 200 MG/10ML IV SOSY
PREFILLED_SYRINGE | INTRAVENOUS | Status: AC
  Filled 2019-03-19: qty 10

## 2019-03-19 MED ORDER — PHENYLEPHRINE 40 MCG/ML (10ML) SYRINGE FOR IV PUSH (FOR BLOOD PRESSURE SUPPORT)
PREFILLED_SYRINGE | INTRAVENOUS | Status: AC
  Filled 2019-03-19: qty 10

## 2019-03-19 MED ORDER — MIDAZOLAM HCL 2 MG/2ML IJ SOLN
INTRAMUSCULAR | Status: AC
  Filled 2019-03-19: qty 2

## 2019-03-19 MED ORDER — LIDOCAINE 2% (20 MG/ML) 5 ML SYRINGE
INTRAMUSCULAR | Status: AC
  Filled 2019-03-19: qty 5

## 2019-03-19 MED ORDER — DEXAMETHASONE SODIUM PHOSPHATE 10 MG/ML IJ SOLN
INTRAMUSCULAR | Status: AC
  Filled 2019-03-19: qty 1

## 2019-03-19 MED ORDER — FENTANYL CITRATE (PF) 100 MCG/2ML IJ SOLN
INTRAMUSCULAR | Status: AC
  Filled 2019-03-19: qty 2

## 2019-03-19 MED ORDER — ONDANSETRON HCL 4 MG/2ML IJ SOLN
INTRAMUSCULAR | Status: AC
  Filled 2019-03-19: qty 2

## 2019-04-11 ENCOUNTER — Other Ambulatory Visit (HOSPITAL_COMMUNITY): Payer: Self-pay | Admitting: Nurse Practitioner

## 2019-04-11 ENCOUNTER — Encounter (HOSPITAL_COMMUNITY): Payer: Self-pay

## 2019-04-11 ENCOUNTER — Telehealth (HOSPITAL_COMMUNITY): Payer: Self-pay | Admitting: *Deleted

## 2019-04-11 NOTE — Telephone Encounter (Signed)
Patient's wife called in stating over the last several days his BP has started to increase in the 150s/90s with HR in the 70-80s. He has been off norvasc since his visit with Butch Penny on 4/27. Wondering if he should restart since bp has started to be more elevated as the days have went by. Will forward to Butch Penny to advise.

## 2019-04-11 NOTE — Telephone Encounter (Signed)
Per donna restart amlodipine at 2.5mg  a day - track BP report back in about 7-10 days. Pt wife verbalized understanding.

## 2019-04-25 ENCOUNTER — Other Ambulatory Visit (HOSPITAL_COMMUNITY): Payer: Self-pay | Admitting: Nurse Practitioner

## 2019-04-25 DIAGNOSIS — I10 Essential (primary) hypertension: Secondary | ICD-10-CM

## 2019-04-30 ENCOUNTER — Encounter (HOSPITAL_COMMUNITY): Payer: Self-pay | Admitting: Emergency Medicine

## 2019-04-30 ENCOUNTER — Other Ambulatory Visit: Payer: Self-pay

## 2019-04-30 ENCOUNTER — Emergency Department (HOSPITAL_COMMUNITY): Payer: Medicare Other

## 2019-04-30 ENCOUNTER — Emergency Department (HOSPITAL_COMMUNITY)
Admission: EM | Admit: 2019-04-30 | Discharge: 2019-05-01 | Disposition: A | Discharge status: Home / Self Care | Payer: Medicare Other | Attending: Emergency Medicine | Admitting: Emergency Medicine

## 2019-04-30 DIAGNOSIS — E872 Acidosis: Secondary | ICD-10-CM

## 2019-04-30 DIAGNOSIS — Z79899 Other long term (current) drug therapy: Secondary | ICD-10-CM | POA: Insufficient documentation

## 2019-04-30 DIAGNOSIS — Z87891 Personal history of nicotine dependence: Secondary | ICD-10-CM | POA: Diagnosis not present

## 2019-04-30 DIAGNOSIS — I4819 Other persistent atrial fibrillation: Secondary | ICD-10-CM | POA: Insufficient documentation

## 2019-04-30 DIAGNOSIS — S0181XA Laceration without foreign body of other part of head, initial encounter: Secondary | ICD-10-CM | POA: Diagnosis not present

## 2019-04-30 DIAGNOSIS — W19XXXA Unspecified fall, initial encounter: Secondary | ICD-10-CM | POA: Insufficient documentation

## 2019-04-30 DIAGNOSIS — R51 Headache: Secondary | ICD-10-CM | POA: Diagnosis not present

## 2019-04-30 DIAGNOSIS — F10929 Alcohol use, unspecified with intoxication, unspecified: Secondary | ICD-10-CM | POA: Diagnosis not present

## 2019-04-30 DIAGNOSIS — Y929 Unspecified place or not applicable: Secondary | ICD-10-CM | POA: Insufficient documentation

## 2019-04-30 DIAGNOSIS — S022XXA Fracture of nasal bones, initial encounter for closed fracture: Secondary | ICD-10-CM

## 2019-04-30 DIAGNOSIS — Z7901 Long term (current) use of anticoagulants: Secondary | ICD-10-CM | POA: Diagnosis not present

## 2019-04-30 DIAGNOSIS — R220 Localized swelling, mass and lump, head: Secondary | ICD-10-CM | POA: Diagnosis not present

## 2019-04-30 DIAGNOSIS — E8729 Other acidosis: Secondary | ICD-10-CM

## 2019-04-30 DIAGNOSIS — Y939 Activity, unspecified: Secondary | ICD-10-CM | POA: Diagnosis not present

## 2019-04-30 DIAGNOSIS — S0990XA Unspecified injury of head, initial encounter: Secondary | ICD-10-CM | POA: Diagnosis present

## 2019-04-30 DIAGNOSIS — R7303 Prediabetes: Secondary | ICD-10-CM | POA: Insufficient documentation

## 2019-04-30 DIAGNOSIS — Y999 Unspecified external cause status: Secondary | ICD-10-CM | POA: Diagnosis not present

## 2019-04-30 DIAGNOSIS — S0191XA Laceration without foreign body of unspecified part of head, initial encounter: Secondary | ICD-10-CM | POA: Diagnosis not present

## 2019-04-30 DIAGNOSIS — S0083XA Contusion of other part of head, initial encounter: Secondary | ICD-10-CM | POA: Diagnosis not present

## 2019-04-30 DIAGNOSIS — M542 Cervicalgia: Secondary | ICD-10-CM | POA: Diagnosis not present

## 2019-04-30 DIAGNOSIS — I10 Essential (primary) hypertension: Secondary | ICD-10-CM | POA: Insufficient documentation

## 2019-04-30 LAB — BASIC METABOLIC PANEL
Anion gap: 25 — ABNORMAL HIGH (ref 5–15)
BUN: 22 mg/dL (ref 8–23)
CO2: 15 mmol/L — ABNORMAL LOW (ref 22–32)
Calcium: 8.4 mg/dL — ABNORMAL LOW (ref 8.9–10.3)
Chloride: 100 mmol/L (ref 98–111)
Creatinine, Ser: 1.26 mg/dL — ABNORMAL HIGH (ref 0.61–1.24)
GFR calc Af Amer: >60 mL/min (ref >60)
GFR calc non Af Amer: 56 mL/min — ABNORMAL LOW (ref >60)
Glucose, Bld: 63 mg/dL — ABNORMAL LOW (ref 70–99)
Potassium: 4.6 mmol/L (ref 3.5–5.1)
Sodium: 140 mmol/L (ref 135–145)

## 2019-04-30 LAB — CBC
HCT: 47.7 % (ref 39.0–52.0)
Hemoglobin: 15.6 g/dL (ref 13.0–17.0)
MCH: 31.5 pg (ref 26.0–34.0)
MCHC: 32.7 g/dL (ref 30.0–36.0)
MCV: 96.4 fL (ref 80.0–100.0)
Platelets: 273 10*3/uL (ref 150–400)
RBC: 4.95 MIL/uL (ref 4.22–5.81)
RDW: 16.4 % — ABNORMAL HIGH (ref 11.5–15.5)
WBC: 13.7 10*3/uL — ABNORMAL HIGH (ref 4.0–10.5)
nRBC: 0 % (ref 0.0–0.2)

## 2019-04-30 LAB — ETHANOL: Alcohol, Ethyl (B): 304 mg/dL (ref <10)

## 2019-04-30 MED ORDER — SODIUM CHLORIDE 0.9 % IV BOLUS
1000.0000 mL | Freq: Once | INTRAVENOUS | Status: AC
  Administered 2019-04-30: 1000 mL via INTRAVENOUS

## 2019-04-30 MED ORDER — DEXTROSE 10 % IV SOLN
INTRAVENOUS | Status: AC
  Administered 2019-04-30: 22:00:00 via INTRAVENOUS

## 2019-04-30 NOTE — ED Triage Notes (Signed)
Pt BIB wife, pt fell yesterday while wife was out. Pt currently taking a blood thinner. Wife reports pt has had increased drowsiness and slurred speech. Dr. Jeanell Sparrow present, verbal order CT head/neck.

## 2019-04-30 NOTE — ED Triage Notes (Signed)
Patient's wife stated that pt has a hx of ETOH abuse. States that patient became belligerent when confronted by family to come to the hospital. Wife states pt had  several alcoholic beverages  PTA in order to persuade pt to come in. EDP notified.

## 2019-04-30 NOTE — ED Provider Notes (Signed)
[facility] EMERGENCY DEPARTMENT Provider Note   CSN: [id number] Arrival date & time: 04/30/19  2021    History   Chief Complaint Chief Complaint  Patient presents with   Fall    HPI Sergio Mack is a 73 y.o. male.     HPI Patient presents to the emergency room for evaluation of a fall yesterday.  According to the family patient has a history of alcohol abuse.  Unclear why he fell yesterday.  It was not witnessed.  Patient cannot tell me why he fell.  He sustained bruising and lacerations to his forehead and face.  He did not want to be seen.  His family was concerned that he is becoming more listless and lethargic today.  They wanted him to come in to get evaluated.  Patient does admit to drinking some alcohol before arrival.  Patient himself states he feels fine. Past Medical History:  Diagnosis Date   Diverticulosis 2006   Erectile dysfunction    GERD (gastroesophageal reflux disease)    History of skin cancer    History of syncope 1994   no etiology; no recurrence   Hyperlipidemia    LDL goal = < 100, ideally < 70   Hypertension    Persistent atrial fibrillation 03/06/2014   PVC's (premature ventricular contractions)    Rotator cuff tear    Visit for monitoring Tikosyn therapy 12/19/2017    Patient Active Problem List   Diagnosis Date Noted   Upper respiratory tract infection 12/14/2018   Cough 10/31/2018   Hyponatremia 10/30/2018   Alcohol withdrawal seizure ([place]) 06/26/2018   History of alcohol abuse 06/25/2018   Leukocytosis 06/25/2018   Head trauma 04/20/2018   Persistent atrial fibrillation 12/19/2017   HFrEF (heart failure with reduced ejection fraction) ([place]) 12/04/2017   Memory difficulties 10/26/2017   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   Sensorineural hearing loss (SNHL), 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   Prediabetes 04/07/2016   Full thickness rotator cuff tear 04/23/2014   Anticoagulation adequate, new on  eliquis 03/07/2014   SKIN CANCER, HX OF 03/24/2010   DIVERTICULOSIS, COLON 12/11/2008   SPINAL STENOSIS 01/14/2008   BACK PAIN, LUMBAR, WITH RADICULOPATHY 01/02/2008   Essential hypertension 10/31/2007   HYPERPLASIA PROSTATE UNS W/O UR OBST & OTH LUTS 10/31/2007   GERD 09/27/2007    Past Surgical History:  Procedure Laterality Date   CARDIOVERSION N/A 12/05/2017   Procedure: CARDIOVERSION;  Surgeon: Dorothy Spark, MD;  Location: [place];  Service: Cardiovascular;  Laterality: N/A;   COLONOSCOPY  2006   Diverticulosis   CYSTECTOMY     lip   CYSTECTOMY     anterior thorax-chest   INGUINAL HERNIA REPAIR      x 2   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 OPEN ACROMIONECTOMY;  Surgeon: Tobi Bastos, MD;  Location: [facility];  Service: Orthopedics;  Laterality: Right;   SPINE SURGERY  2012   Dr Gladstone Lighter   TEE WITHOUT CARDIOVERSION N/A 12/05/2017   Procedure: TRANSESOPHAGEAL ECHOCARDIOGRAM (TEE);  Surgeon: Dorothy Spark, MD;  Location: [facility] ENDOSCOPY;  Service: Cardiovascular;  Laterality: N/A;   TONSILLECTOMY AND ADENOIDECTOMY          Home Medications    Prior to Admission medications  Medication Sig Start Date End Date Taking? Authorizing Provider  amLODipine (NORVASC) 5 MG tablet Take 0.5 tablets (2.5 mg total) by mouth daily. 03/18/19 03/17/20  Sherran Needs, NP  apixaban (ELIQUIS) 5 MG TABS tablet Take 1 tablet (5 mg total) by mouth 2 (two) times daily. 11/12/18   Sherran Needs, NP  cholecalciferol (VITAMIN D) 1000 units tablet Take 1,000 Units by mouth daily.    [provider]  Cyanocobalamin (VITAMIN B-12) 5000 MCG SUBL Place 5,000 mcg under the tongue daily.     [provider]  dofetilide (TIKOSYN) 500 MCG capsule TAKE 1 CAPSULE TWICE DAILY 03/04/19   Sherran Needs, NP  furosemide (LASIX) 20 MG tablet TAKE 1 TABLET EVERY DAY 02/06/19   Sherran Needs, NP  levETIRAcetam (KEPPRA) 500 MG tablet Take 1 tablet (500 mg total) by mouth 2 (two) times daily. 07/04/18   Melvenia Beam, MD  metoprolol tartrate (LOPRESSOR) 100 MG tablet TAKE 1 TABLET TWICE DAILY ( DOSE INCREASE ) 04/11/19   Sherran Needs, NP  Multiple Vitamins-Minerals (ICAPS AREDS 2 PO) Take 1 capsule by mouth 2 (two) times daily.    [provider]  quinapril (ACCUPRIL) 40 MG tablet Take 1 tablet (40 mg total) by mouth every morning. 06/04/18   Sherran Needs, NP  sodium chloride (OCEAN) 0.65 % SOLN nasal spray Place 1 spray into both nostrils as needed for congestion.    [provider]  Zinc 50 MG CAPS Take 50 mg by mouth as needed.     [provider]    Family History Family History  Problem Relation Age of Onset   Heart attack Father 91   Diabetes Mother    Transient ischemic attack Mother 12   Diabetes Sister    Heart attack Sister 34   Stomach cancer Maternal Grandfather    Cirrhosis Paternal Uncle        alcoholic   Alzheimer's disease Brother    Colon cancer Neg Hx    Colon polyps Neg Hx     Social History Social History   Tobacco Use   Smoking status: Former Smoker    Packs/day: 1.00    Types: Cigarettes    Last attempt to quit: 1990    Years since quitting: 30.4   Smokeless tobacco: Former Systems developer    Types: [place] date: 1990   Tobacco comment: smoked [place] with 12 year abstinence ( 21 total years of smoking), up to 1 ppd  Substance Use Topics   Alcohol use: Not Currently    Comment: 09/26/18 NONE; previous 4 shots of alcohol a few days a week   Drug use: Never     Allergies   Glucosamine and Shellfish-derived products   Review of Systems Review of Systems  All other systems reviewed and  are negative.    Physical Exam Updated Vital Signs BP 115/69    Pulse 79    Temp 98 F (36.7 C)    Resp 19    SpO2 97%   Physical Exam Vitals signs and nursing note reviewed.  Constitutional:      General: He is not in acute distress.    Appearance: He is well-developed.  HENT:     Head: Normocephalic.     Comments: Well approximated laceration left forehead, dried blood above the right artery, bruising around the right eye    Right Ear: External ear normal.     Left Ear: External ear  normal.  Eyes:     General: No scleral icterus.       Right eye: No discharge.        Left eye: No discharge.     Conjunctiva/sclera: Conjunctivae normal.  Neck:     Musculoskeletal: Neck supple.     Trachea: No tracheal deviation.  Cardiovascular:     Rate and Rhythm: Normal rate and regular rhythm.  Pulmonary:     Effort: Pulmonary effort is normal. No respiratory distress.     Breath sounds: Normal breath sounds. No stridor. No wheezing or rales.  Abdominal:     General: Bowel sounds are normal. There is no distension.     Palpations: Abdomen is soft.     Tenderness: There is no abdominal tenderness. There is no guarding or rebound.  Musculoskeletal:        General: No tenderness.     Right shoulder: He exhibits no tenderness, no bony tenderness and no swelling.     Left shoulder: He exhibits no tenderness, no bony tenderness and no swelling.     Right wrist: He exhibits no tenderness, no bony tenderness and no swelling.     Left wrist: He exhibits no tenderness, no bony tenderness and no swelling.     Right hip: He exhibits normal range of motion, no tenderness, no bony tenderness and no swelling.     Left hip: He exhibits normal range of motion, no tenderness and no bony tenderness.     Right ankle: He exhibits no swelling. No tenderness.     Left ankle: He exhibits no swelling. No tenderness.     Cervical back: He exhibits no tenderness, no bony tenderness and no swelling.      Thoracic back: He exhibits no tenderness, no bony tenderness and no swelling.     Lumbar back: He exhibits no tenderness, no bony tenderness and no swelling.  Skin:    General: Skin is warm and dry.     Findings: No rash.  Neurological:     Mental Status: He is alert.     Cranial Nerves: No cranial nerve deficit (no facial droop, extraocular movements intact, speech is slurred).     Sensory: No sensory deficit.     Motor: No abnormal muscle tone or seizure activity.     Coordination: Coordination normal.      ED Treatments / Results  Labs (all labs ordered are listed, but only abnormal results are displayed) Labs Reviewed  CBC - Abnormal; Notable for the following components:      Result Value   WBC 13.7 (*)    RDW 16.4 (*)    All other components within normal limits  BASIC METABOLIC PANEL - Abnormal; Notable for the following components:   CO2 15 (*)    Glucose, Bld 63 (*)    Creatinine, Ser 1.26 (*)    Calcium 8.4 (*)    GFR calc non Af Amer 56 (*)    Anion gap 25 (*)    All other components within normal limits  ETHANOL - Abnormal; Notable for the following components:   Alcohol, Ethyl (B) 304 (*)    All other components within normal limits     Radiology Ct Head Wo Contrast  Result Date: 04/30/2019 CLINICAL DATA:  Pain status post fall EXAM: CT HEAD WITHOUT CONTRAST CT MAXILLOFACIAL WITHOUT CONTRAST CT CERVICAL SPINE WITHOUT CONTRAST TECHNIQUE: Multidetector CT imaging of the head, cervical spine, and maxillofacial structures were performed using the standard protocol without  intravenous contrast. Multiplanar CT image reconstructions of the cervical spine and maxillofacial structures were also generated. COMPARISON:  CT dated 06/24/2018 FINDINGS: CT HEAD FINDINGS Brain: No evidence of acute infarction, hemorrhage, hydrocephalus, extra-axial collection or mass lesion/mass effect. There are chronic microvascular ischemic changes and age related volume loss. Vascular: No  hyperdense vessel or unexpected calcification. Skull: Normal. Negative for fracture or focal lesion. There is left frontal scalp swelling without evidence of an underlying calvarial defect. There is right forehead/right periorbital soft tissue swelling without evidence of an underlying calvarial defect. Other: None. CT MAXILLOFACIAL FINDINGS Osseous: There are acute appearing nasal bone fractures. Orbits: Negative. No traumatic or inflammatory finding. The patient is status post bilateral cataract surgery. Sinuses: Clear. Soft tissues: Right periorbital, right frontal, and left frontal soft tissue swelling is noted without evidence of a calvarial fracture. CT CERVICAL SPINE FINDINGS Alignment: Normal. Skull base and vertebrae: No acute fracture. No primary bone lesion or focal pathologic process. Evaluation was limited by motion artifact. Soft tissues and spinal canal: No prevertebral fluid or swelling. No visible canal hematoma. Disc levels:  There is mild multilevel disc height loss. Upper chest: Negative. Other: None IMPRESSION: 1. No acute intracranial abnormality detected. There are chronic microvascular ischemic changes and age related volume loss. 2. No cervical spine fracture, however evaluation was limited by significant motion artifact. 3. Acute appearing mildly displaced nasal bone fractures. 4. Right periorbital and frontal scalp swelling without evidence of a calvarial defect. Electronically Signed   By: Constance Holster M.D.   On: 04/30/2019 22:01   Ct Cervical Spine Wo Contrast  Result Date: 04/30/2019 CLINICAL DATA:  Pain status post fall EXAM: CT HEAD WITHOUT CONTRAST CT MAXILLOFACIAL WITHOUT CONTRAST CT CERVICAL SPINE WITHOUT CONTRAST TECHNIQUE: Multidetector CT imaging of the head, cervical spine, and maxillofacial structures were performed using the standard protocol without intravenous contrast. Multiplanar CT image reconstructions of the cervical spine and maxillofacial structures were  also generated. COMPARISON:  CT dated 06/24/2018 FINDINGS: CT HEAD FINDINGS Brain: No evidence of acute infarction, hemorrhage, hydrocephalus, extra-axial collection or mass lesion/mass effect. There are chronic microvascular ischemic changes and age related volume loss. Vascular: No hyperdense vessel or unexpected calcification. Skull: Normal. Negative for fracture or focal lesion. There is left frontal scalp swelling without evidence of an underlying calvarial defect. There is right forehead/right periorbital soft tissue swelling without evidence of an underlying calvarial defect. Other: None. CT MAXILLOFACIAL FINDINGS Osseous: There are acute appearing nasal bone fractures. Orbits: Negative. No traumatic or inflammatory finding. The patient is status post bilateral cataract surgery. Sinuses: Clear. Soft tissues: Right periorbital, right frontal, and left frontal soft tissue swelling is noted without evidence of a calvarial fracture. CT CERVICAL SPINE FINDINGS Alignment: Normal. Skull base and vertebrae: No acute fracture. No primary bone lesion or focal pathologic process. Evaluation was limited by motion artifact. Soft tissues and spinal canal: No prevertebral fluid or swelling. No visible canal hematoma. Disc levels:  There is mild multilevel disc height loss. Upper chest: Negative. Other: None IMPRESSION: 1. No acute intracranial abnormality detected. There are chronic microvascular ischemic changes and age related volume loss. 2. No cervical spine fracture, however evaluation was limited by significant motion artifact. 3. Acute appearing mildly displaced nasal bone fractures. 4. Right periorbital and frontal scalp swelling without evidence of a calvarial defect. Electronically Signed   By: Constance Holster M.D.   On: 04/30/2019 22:01   Ct Maxillofacial Wo Contrast  Result Date: 04/30/2019 CLINICAL DATA:  Pain status post fall  EXAM: CT HEAD WITHOUT CONTRAST CT MAXILLOFACIAL WITHOUT CONTRAST CT CERVICAL  SPINE WITHOUT CONTRAST TECHNIQUE: Multidetector CT imaging of the head, cervical spine, and maxillofacial structures were performed using the standard protocol without intravenous contrast. Multiplanar CT image reconstructions of the cervical spine and maxillofacial structures were also generated. COMPARISON:  CT dated 06/24/2018 FINDINGS: CT HEAD FINDINGS Brain: No evidence of acute infarction, hemorrhage, hydrocephalus, extra-axial collection or mass lesion/mass effect. There are chronic microvascular ischemic changes and age related volume loss. Vascular: No hyperdense vessel or unexpected calcification. Skull: Normal. Negative for fracture or focal lesion. There is left frontal scalp swelling without evidence of an underlying calvarial defect. There is right forehead/right periorbital soft tissue swelling without evidence of an underlying calvarial defect. Other: None. CT MAXILLOFACIAL FINDINGS Osseous: There are acute appearing nasal bone fractures. Orbits: Negative. No traumatic or inflammatory finding. The patient is status post bilateral cataract surgery. Sinuses: Clear. Soft tissues: Right periorbital, right frontal, and left frontal soft tissue swelling is noted without evidence of a calvarial fracture. CT CERVICAL SPINE FINDINGS Alignment: Normal. Skull base and vertebrae: No acute fracture. No primary bone lesion or focal pathologic process. Evaluation was limited by motion artifact. Soft tissues and spinal canal: No prevertebral fluid or swelling. No visible canal hematoma. Disc levels:  There is mild multilevel disc height loss. Upper chest: Negative. Other: None IMPRESSION: 1. No acute intracranial abnormality detected. There are chronic microvascular ischemic changes and age related volume loss. 2. No cervical spine fracture, however evaluation was limited by significant motion artifact. 3. Acute appearing mildly displaced nasal bone fractures. 4. Right periorbital and frontal scalp swelling without  evidence of a calvarial defect. Electronically Signed   By: Constance Holster M.D.   On: 04/30/2019 22:01    Procedures Procedures (including critical care time)  Medications Ordered in ED Medications  dextrose 10 % infusion ( Intravenous New Bag/Given 04/30/19 2217)  sodium chloride 0.9 % bolus 1,000 mL (1,000 mLs Intravenous New Bag/Given 04/30/19 2217)     Initial Impression / Assessment and Plan / ED Course  I have reviewed the triage vital signs and the nursing notes.  Pertinent labs & imaging results that were available during my care of the patient were reviewed by me and considered in my medical decision making (see chart for details).  Clinical Course as of Apr 29 2304  Tue Apr 30, 2019  2210 Labs notable for an anion gap metabolic acidosis.  This likely related to his his alcohol intoxication.  Plan on IV fluids and dextrose infusion.  C T scan reviewed.  Notable for nasal fracture.   [JK]    Clinical Course User Index [JK] Dorie Rank, MD     Patient presents to the emergency room for evaluation of a fall yesterday.  Patient has evidence of injury around his right thigh and a laceration of his forehead.  The wounds were from yesterday.  No indication for suturing at this time.  Wound care was provided in ED.  Patient's labs were notable for an anion gap metabolic acidosis.  I think this is related to his severe alcohol intoxication.  Patient was hydrated with IV fluids.  He was also given dextrose IV.  Patient was tolerating p.o. without any vomiting or diarrhea.  Patient was anxious to go home.  He was counseled the severity of his drinking. Final Clinical Impressions(s) / ED Diagnoses   Final diagnoses:  Contusion of face, initial encounter  Facial laceration, initial encounter  Alcoholic ketoacidosis  Alcoholic intoxication with complication [facility])    ED Discharge Orders    None       Dorie Rank, MD 04/30/19 2307

## 2019-04-30 NOTE — ED Notes (Signed)
Dried blood cleaned from pt's face. All wounds were irrigated, bacitracin and new dressing applied.

## 2019-05-01 DIAGNOSIS — S0083XA Contusion of other part of head, initial encounter: Secondary | ICD-10-CM | POA: Diagnosis not present

## 2019-05-01 LAB — BASIC METABOLIC PANEL
Anion gap: 18 — ABNORMAL HIGH (ref 5–15)
BUN: 20 mg/dL (ref 8–23)
CO2: 17 mmol/L — ABNORMAL LOW (ref 22–32)
Calcium: 7.4 mg/dL — ABNORMAL LOW (ref 8.9–10.3)
Chloride: 99 mmol/L (ref 98–111)
Creatinine, Ser: 1.09 mg/dL (ref 0.61–1.24)
GFR calc Af Amer: >60 mL/min (ref >60)
GFR calc non Af Amer: >60 mL/min (ref >60)
Glucose, Bld: 383 mg/dL — ABNORMAL HIGH (ref 70–99)
Potassium: 4.3 mmol/L (ref 3.5–5.1)
Sodium: 134 mmol/L — ABNORMAL LOW (ref 135–145)

## 2019-05-01 NOTE — Discharge Instructions (Addendum)
Drink plenty of fluids and adhere to a balanced diet.  Follow-up with your primary doctor on Monday as scheduled, and return to the ER if symptoms significantly worsen or change.

## 2019-05-01 NOTE — ED Notes (Signed)
Pt walked to bathroom and walked back to room with nurse and tech to assure of no falling.

## 2019-05-01 NOTE — ED Provider Notes (Signed)
Care assumed from Dr. Tomi Bamberger at shift change.  Patient presents here with weakness and fall.  Patient has history of chronic alcoholism and has a blood alcohol of over 300.  He was also found to have a metabolic acidosis, likely related to alcoholic ketoacidosis.  Patient was given IV fluids, then signed out to me awaiting repeat BMP.  This was performed and shows an improving anion gap and increasing CO2.  He does have an elevated glucose, however I do not feel as though this is diabetic ketoacidosis as his initial sugar upon presentation was 61.  He subsequently received dextrose in the ER and I believe this is the cause of his elevated sugar.    The patient states he is feeling better and is adamant about going home.  Family member present at bedside and comfortable with disposition.   Veryl Speak, MD 05/01/19 [phone]

## 2019-05-01 NOTE — ED Notes (Signed)
Patient verbalizes understanding of discharge instructions. Opportunity for questioning and answers were provided. Armband removed by staff, pt discharged from ED in wheelchair.  

## 2019-05-06 ENCOUNTER — Ambulatory Visit: Payer: Medicare Other | Admitting: Internal Medicine

## 2019-05-07 ENCOUNTER — Telehealth (HOSPITAL_COMMUNITY): Payer: Self-pay | Admitting: *Deleted

## 2019-05-07 DIAGNOSIS — I10 Essential (primary) hypertension: Secondary | ICD-10-CM

## 2019-05-07 DIAGNOSIS — S199XXA Unspecified injury of neck, initial encounter: Secondary | ICD-10-CM | POA: Diagnosis not present

## 2019-05-07 DIAGNOSIS — S0990XA Unspecified injury of head, initial encounter: Secondary | ICD-10-CM | POA: Diagnosis not present

## 2019-05-07 MED ORDER — AMLODIPINE BESYLATE 2.5 MG PO TABS: 2.5000 mg | ORAL_TABLET | Freq: Every day | ORAL | 1 refills | Status: DC

## 2019-05-07 MED ORDER — METOPROLOL TARTRATE 100 MG PO TABS: 100.0000 mg | ORAL_TABLET | Freq: Two times a day (BID) | ORAL | 1 refills | Status: DC

## 2019-05-07 MED ORDER — QUINAPRIL HCL 40 MG PO TABS: 40.0000 mg | ORAL_TABLET | Freq: Every morning | ORAL | 1 refills | Status: DC

## 2019-05-07 NOTE — Telephone Encounter (Signed)
Pts spouse called regarding amlodipine 2.5mg .  She states his BPs have been running well.  333O systolic and 80 diastolic..  Pt spouse requests refills for amlodipine at the lower dose, metop, and quinipril.

## 2019-05-08 DIAGNOSIS — I4510 Unspecified right bundle-branch block: Secondary | ICD-10-CM | POA: Diagnosis not present

## 2019-05-08 DIAGNOSIS — R9431 Abnormal electrocardiogram [ECG] [EKG]: Secondary | ICD-10-CM | POA: Diagnosis not present

## 2019-05-08 DIAGNOSIS — I4891 Unspecified atrial fibrillation: Secondary | ICD-10-CM | POA: Diagnosis not present

## 2019-05-13 ENCOUNTER — Telehealth: Payer: Self-pay | Admitting: *Deleted

## 2019-05-13 NOTE — Telephone Encounter (Signed)
Rec'd notification stating pt was admitted 05/07/19 to [facility] Diagnoses w/ Dehydration [phone]); Acute kidney failure, unspecified. Pt was D/C 05/11/19 will call pt to follow-up and make hosp f/u appt if need too.Marland KitchenJohny Chess

## 2019-05-13 NOTE — Telephone Encounter (Signed)
Called pt to verify discharge information received from [name]. Wife states yes he was discharge on Saturday. She has called and made f/u appt for tomorrow w/Dr. Quay Burow. Completed TCM call below.Sergio Mack  Transition Care Management Follow-up Telephone Call   Date discharged? 05/11/19   How have you been since you were released from the hospital? Wife states he is doing fine   Do you understand why you were in the hospital? YES   Do you understand the discharge instructions? YES   Where were you discharged to? Home   Items Reviewed:  Medications reviewed: YES, wife states no change  Allergies reviewed: YES  Dietary changes reviewed: YES, heart healthy  Referrals reviewed: No referral recommeded   Functional Questionnaire:   Activities of Daily Living (ADLs):   she states he are independent in the following: ambulation, bathing and hygiene, feeding, continence, grooming, toileting and dressing States he doesn't require assistance    Any transportation issues/concerns?: NO   Any patient concerns? NO   Confirmed importance and date/time of follow-up visits scheduled YES, appt 05/15/19  Provider Appointment booked with Dr. Quay Burow  Confirmed with patient if condition begins to worsen call PCP or go to the ER.  Patient was given the office number and encouraged to call back with question or concerns.  : YES

## 2019-05-14 ENCOUNTER — Encounter: Payer: Self-pay | Admitting: Internal Medicine

## 2019-05-14 DIAGNOSIS — F1020 Alcohol dependence, uncomplicated: Secondary | ICD-10-CM | POA: Insufficient documentation

## 2019-05-14 NOTE — Patient Instructions (Addendum)
  Have blood work done today.    Restart the quinapril for your blood pressure.    Psychiatrists:  [place]  [place], [place]  [facility]            [address] [place], [place]

## 2019-05-14 NOTE — Progress Notes (Signed)
Subjective:    Patient ID: Sergio Mack, male    DOB: 1946/10/19, 73 y.o.   MRN: [id number]  HPI The patient is here for follow up from the hospital.   He was admitted to [facility] 5/02/774-12/18/76 for alcoholic intoxication with complication.  His wife took him the med center for detox.  He was drinking about 1 liter of vodka a day.  He has a history of alcoholism with history of DTs and seizures.  He denied any depression or SI/HI.  He was found to have an anion gap metabolic acidosis, several low electrolytes and AKI.     Alcoholism, detox: Alcohol level upon arrival more than 300 Psychiatry was consulted Thiamine, folate, IVF, valium taper Monitored for withdrawal symptoms and there were no complications Discharged home on multiple vitamins Advised follow up with psychiatry as an outpatient   Hypokalemia,hypomagnesemia, hypophosphatemia: repleted He is taking magnesium  AKI:  Resolved with IVF Quinapril stopped Kidney function upon discharge was normal  Anion gap metabolic acidosis: Likely due to alcoholic ketoacidosis Resolved after hydration He states he is drinking plenty of fluids since being home  Persistent Afib: On metoprolol, eliquis, dofetilide Denies any chest pain, palpitations or shortness of breath  Hypertension: Metoprolol Quinapril discontinued due to decreased blood pressure and acute kidney injury  HFpEF: Compensated No change in meds  Seizure d/o:   Keppra  Thrombocytopenia: Monitor CBC  Recurrent fall: PT/OT recommended SNF vs 24/7 home care He declined SNF He was using a walker when he first got home and is no longer using that He has become more mobile and is moving better   He is slowly regained mobility and strength.  His appetite is still decreased.  He does not want to eat too much to prevent weight gain.   He is typically eating 1 meal a day.  Up until yesterday he was slurring his speech.  Per his wife that  is no longer present today.  He is taking his medications as prescribed.  They wonder about continuing the vitamins.  His wife asked for specific recommendations for a psychiatrist.  He does not feel that he will start drinking again and is unsure if he needs to follow-up with a psychiatrist or not.  He denies any depression or anxiety.  He has not been sleeping well since getting home.  He has had some good sleep but is also had nights that he does not sleep well.    Medications and allergies reviewed with patient and updated if appropriate.  Patient Active Problem List   Diagnosis Date Noted  . Alcoholism ([place]) 05/14/2019  . Cough 10/31/2018  . Hyponatremia 10/30/2018  . Alcohol withdrawal seizure ([place]) 06/26/2018  . History of alcohol abuse 06/25/2018  . Leukocytosis 06/25/2018  . Head trauma 04/20/2018  . Persistent atrial fibrillation 12/19/2017  . HFrEF (heart failure with reduced ejection fraction) ([place]) 12/04/2017  . Memory difficulties 10/26/2017  . 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 Sensorineural hearing loss (SNHL), 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  . Prediabetes 04/07/2016  . Full thickness rotator cuff tear 04/23/2014  . Anticoagulation adequate, new on eliquis 03/07/2014  . SKIN CANCER, HX OF 03/24/2010  . DIVERTICULOSIS, COLON 12/11/2008  . SPINAL STENOSIS 01/14/2008  . BACK PAIN, LUMBAR, WITH RADICULOPATHY 01/02/2008  . Essential hypertension 10/31/2007  . 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 GERD 09/27/2007    Current Outpatient Medications on File Prior to Visit  Medication Sig Dispense  Refill  . amLODipine (NORVASC) 2.5 MG tablet Take 1 tablet (2.5 mg total) by mouth daily. 90 tablet 1  . apixaban (ELIQUIS) 5 MG TABS tablet Take 1 tablet (5 mg total) by mouth 2 (two) times daily. 180 tablet 3  . dofetilide (TIKOSYN) 500 MCG capsule TAKE 1 CAPSULE TWICE DAILY (Patient taking differently: Take 500 mcg by mouth 2 (two) times daily. ) 180  capsule 2  . furosemide (LASIX) 20 MG tablet TAKE 1 TABLET EVERY DAY (Patient taking differently: Take 20 mg by mouth every morning. ) 90 tablet 2  . levETIRAcetam (KEPPRA) 500 MG tablet Take 1 tablet (500 mg total) by mouth 2 (two) times daily. 180 tablet 4  . metoprolol tartrate (LOPRESSOR) 100 MG tablet Take 1 tablet (100 mg total) by mouth 2 (two) times daily. 180 tablet 1  . Multiple Vitamins-Minerals (ICAPS AREDS 2 PO) Take 1 capsule by mouth 2 (two) times daily.    . sodium chloride (OCEAN) 0.65 % SOLN nasal spray Place 1 spray into both nostrils as needed for congestion.     No current facility-administered medications on file prior to visit.     Past Medical History:  Diagnosis Date  . Diverticulosis 2006  . Erectile dysfunction   . GERD (gastroesophageal reflux disease)   . History of skin cancer   . History of syncope 1994   no etiology; no recurrence  . Hyperlipidemia    LDL goal = < 100, ideally < 70  . Hypertension   . Persistent atrial fibrillation 03/06/2014  . PVC's (premature ventricular contractions)   . Rotator cuff tear   . Visit for monitoring Tikosyn therapy 12/19/2017    Past Surgical History:  Procedure Laterality Date  . CARDIOVERSION N/A 12/05/2017   Procedure: CARDIOVERSION;  Surgeon: Dorothy Spark, MD;  Location: [facility] ENDOSCOPY;  Service: Cardiovascular;  Laterality: N/A;  . COLONOSCOPY  2006   Diverticulosis  . CYSTECTOMY     lip  . CYSTECTOMY     anterior thorax-chest  . INGUINAL HERNIA REPAIR      x 2  . 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 SHOULDER OPEN ROTATOR CUFF REPAIR Right 04/23/2014   Procedure: RIGHT SHOULDER ROTATOR CUFF REPAIR WITH GRAFT AND ANCHORS . OPEN ACROMIONECTOMY;  Surgeon: Tobi Bastos, MD;  Location: [facility] ORS;  Service: Orthopedics;  Laterality: Right;  . SPINE SURGERY   2012   Dr Gladstone Lighter  . TEE WITHOUT CARDIOVERSION N/A 12/05/2017   Procedure: TRANSESOPHAGEAL ECHOCARDIOGRAM (TEE);  Surgeon: Dorothy Spark, MD;  Location: [facility];  Service: Cardiovascular;  Laterality: N/A;  . TONSILLECTOMY AND ADENOIDECTOMY      Social History   Socioeconomic History  . Marital status: Married    Spouse name: Not on file  . Number of children: 1  . Years of education: Not on file  . Highest education level: High school graduate  Occupational History  . Not on file  Social Needs  . Financial resource strain: Not on file  . Food insecurity    Worry: Not on file    Inability: Not on file  . Transportation needs    Medical: Not on file    Non-medical: Not on file  Tobacco Use  . Smoking status: Former Smoker    Packs/day: 1.00    Types: Cigarettes    Quit date: 1990  Years since quitting: 30.4  . Smokeless tobacco: Former Systems developer    Types: Chew    Quit date: 67  . Tobacco comment: smoked [place] with 12 year abstinence ( 21 total years of smoking), up to 1 ppd  Substance and Sexual Activity  . Alcohol use: Not Currently    Comment: 09/26/18 NONE; previous 4 shots of alcohol a few days a week  . Drug use: Never  . Sexual activity: Not on file  Lifestyle  . Physical activity    Days per week: Not on file    Minutes per session: Not on file  . Stress: Not on file  Relationships  . Social Herbalist on phone: Not on file    Gets together: Not on file    Attends religious service: Not on file    Active member of club or organization: Not on file    Attends meetings of clubs or organizations: Not on file    Relationship status: Not on file  Other Topics Concern  . Not on file  Social History Narrative   Lives at home with his wife   Works in his garden and walks 4 days per week   Right handed   Caffeine: minimal    Family History  Problem Relation Age of Onset  . Heart attack Father 40  . Diabetes Mother   . Transient  ischemic attack Mother 73  . Diabetes Sister   . Heart attack Sister 26  . Stomach cancer Maternal Grandfather   . Cirrhosis Paternal Uncle        alcoholic  . Alzheimer's disease Brother   . Colon cancer Neg Hx   . Colon polyps Neg Hx     Review of Systems  Constitutional: Negative for chills and fever.  Respiratory: Positive for cough (dry). Negative for shortness of breath and wheezing.   Cardiovascular: Negative for chest pain, palpitations and leg swelling.  Gastrointestinal: Negative for abdominal pain, blood in stool, diarrhea (loose stool/diarrhea - few times a day) and nausea.  Neurological: Positive for headaches (occasional). Negative for light-headedness and numbness.  Psychiatric/Behavioral: Positive for sleep disturbance (sleep varies). Negative for dysphoric mood. The patient is not nervous/anxious.        Objective:   Vitals:   05/15/19 0954  BP: (!) 170/92  Pulse: 69  Resp: 16  Temp: 98 F (36.7 C)  SpO2: 99%   BP Readings from Last 3 Encounters:  05/15/19 (!) 170/92  05/01/19 (!) 118/55  03/18/19 106/75   Wt Readings from Last 3 Encounters:  05/15/19 177 lb 6.4 oz (80.5 kg)  03/18/19 166 lb 3.2 oz (75.4 kg)  12/14/18 186 lb 12.8 oz (84.7 kg)   Body mass index is 24.06 kg/m.   Physical Exam    Constitutional: Appears well-developed and well-nourished. No distress.  HENT:  Head: Normocephalic and atraumatic.  Neck: Neck supple. No tracheal deviation present. No thyromegaly present.  No cervical lymphadenopathy Cardiovascular: Normal rate, irregular rhythm and normal heart sounds.  2/6 systolic murmur heard. No carotid bruit .  No edema Pulmonary/Chest: Effort normal and breath sounds normal. No respiratory distress. No has no wheezes. No rales. Abdomen: Soft, nontender, nondistended Skin: Skin is warm and dry. Not diaphoretic.  Psychiatric: Normal mood and affect. Behavior is normal.    EKG: Afib with RVR 102 bpm, IRBBB, anterior infarct, age  undetermined, St and T wave abn, prolongerd QT  Ct head, cervical spine  05/07/19:  Atrophy, chronic  microvascular disease. No acute intracranial abnormality. No acute bony abnormality in the cervical spine  Labs from hospital reviewed - in care everywhere  Assessment & Plan:    See Problem List for Assessment and Plan of chronic medical problems.

## 2019-05-15 ENCOUNTER — Ambulatory Visit (INDEPENDENT_AMBULATORY_CARE_PROVIDER_SITE_OTHER): Payer: Medicare Other | Admitting: Internal Medicine

## 2019-05-15 ENCOUNTER — Other Ambulatory Visit (INDEPENDENT_AMBULATORY_CARE_PROVIDER_SITE_OTHER): Payer: Medicare Other

## 2019-05-15 ENCOUNTER — Encounter: Payer: Self-pay | Admitting: Internal Medicine

## 2019-05-15 ENCOUNTER — Other Ambulatory Visit: Payer: Self-pay

## 2019-05-15 VITALS — BP 170/92 | HR 69 | Temp 98.0°F | Resp 16 | Ht 72.0 in | Wt 177.4 lb

## 2019-05-15 DIAGNOSIS — E872 Acidosis, unspecified: Secondary | ICD-10-CM | POA: Insufficient documentation

## 2019-05-15 DIAGNOSIS — I4819 Other persistent atrial fibrillation: Secondary | ICD-10-CM

## 2019-05-15 DIAGNOSIS — I5022 Chronic systolic (congestive) heart failure: Secondary | ICD-10-CM

## 2019-05-15 DIAGNOSIS — I10 Essential (primary) hypertension: Secondary | ICD-10-CM

## 2019-05-15 DIAGNOSIS — F10230 Alcohol dependence with withdrawal, uncomplicated: Secondary | ICD-10-CM

## 2019-05-15 DIAGNOSIS — E8729 Other acidosis: Secondary | ICD-10-CM

## 2019-05-15 DIAGNOSIS — F1020 Alcohol dependence, uncomplicated: Secondary | ICD-10-CM | POA: Diagnosis not present

## 2019-05-15 DIAGNOSIS — F10930 Alcohol use, unspecified with withdrawal, uncomplicated: Secondary | ICD-10-CM

## 2019-05-15 LAB — COMPREHENSIVE METABOLIC PANEL
ALT: 31 U/L (ref 0–53)
AST: 37 U/L (ref 0–37)
Albumin: 3.6 g/dL (ref 3.5–5.2)
Alkaline Phosphatase: 76 U/L (ref 39–117)
BUN: 7 mg/dL (ref 6–23)
CO2: 30 mEq/L (ref 19–32)
Calcium: 8.5 mg/dL (ref 8.4–10.5)
Chloride: 99 mEq/L (ref 96–112)
Creatinine, Ser: 0.73 mg/dL (ref 0.40–1.50)
GFR: 105.11 mL/min (ref >60.00)
Glucose, Bld: 96 mg/dL (ref 70–99)
Potassium: 3.5 mEq/L (ref 3.5–5.1)
Sodium: 136 mEq/L (ref 135–145)
Total Bilirubin: 0.8 mg/dL (ref 0.2–1.2)
Total Protein: 6.4 g/dL (ref 6.0–8.3)

## 2019-05-15 LAB — CBC WITH DIFFERENTIAL/PLATELET
Basophils Absolute: 0 10*3/uL (ref 0.0–0.1)
Basophils Relative: 0.5 % (ref 0.0–3.0)
Eosinophils Absolute: 0.1 10*3/uL (ref 0.0–0.7)
Eosinophils Relative: 1.4 % (ref 0.0–5.0)
HCT: 33.6 % — ABNORMAL LOW (ref 39.0–52.0)
Hemoglobin: 11.2 g/dL — ABNORMAL LOW (ref 13.0–17.0)
Lymphocytes Relative: 26.3 % (ref 12.0–46.0)
Lymphs Abs: 1.9 10*3/uL (ref 0.7–4.0)
MCHC: 33.3 g/dL (ref 30.0–36.0)
MCV: 95 fl (ref 78.0–100.0)
Monocytes Absolute: 1.5 10*3/uL — ABNORMAL HIGH (ref 0.1–1.0)
Monocytes Relative: 20.8 % — ABNORMAL HIGH (ref 3.0–12.0)
Neutro Abs: 3.7 10*3/uL (ref 1.4–7.7)
Neutrophils Relative %: 51 % (ref 43.0–77.0)
Platelets: 242 10*3/uL (ref 150.0–400.0)
RBC: 3.54 Mil/uL — ABNORMAL LOW (ref 4.22–5.81)
RDW: 16.4 % — ABNORMAL HIGH (ref 11.5–15.5)
WBC: 7.3 10*3/uL (ref 4.0–10.5)

## 2019-05-15 LAB — MAGNESIUM: Magnesium: 1.7 mg/dL (ref 1.5–2.5)

## 2019-05-15 MED ORDER — VITAMIN D-3 25 MCG (1000 UT) PO CAPS: 1.0000 | ORAL_CAPSULE | Freq: Every day | ORAL | 3 refills | Status: DC

## 2019-05-15 MED ORDER — QUINAPRIL HCL 40 MG PO TABS: 40.0000 mg | ORAL_TABLET | Freq: Every morning | ORAL | 1 refills | Status: DC

## 2019-05-15 MED ORDER — CYANOCOBALAMIN 1000 MCG PO TABS: 1000.0000 ug | ORAL_TABLET | Freq: Every day | ORAL | 3 refills | Status: DC

## 2019-05-15 MED ORDER — FOLIC ACID 1 MG PO TABS: 1.0000 mg | ORAL_TABLET | Freq: Every day | ORAL | 3 refills | Status: DC

## 2019-05-15 MED ORDER — THIAMINE HCL 100 MG PO TABS: 100.0000 mg | ORAL_TABLET | Freq: Every day | ORAL | 3 refills | Status: DC

## 2019-05-15 MED ORDER — MAGNESIUM OXIDE 400 MG PO TABS: 400.0000 mg | ORAL_TABLET | Freq: Every day | ORAL | 3 refills | Status: AC

## 2019-05-15 NOTE — Assessment & Plan Note (Signed)
No seizure activity with recent detox Continue Keppra

## 2019-05-15 NOTE — Assessment & Plan Note (Signed)
In A. fib on exam Asymptomatic Rate controlled Continue current medications

## 2019-05-15 NOTE — Assessment & Plan Note (Signed)
Appears euvolemic Continue current medications 

## 2019-05-15 NOTE — Assessment & Plan Note (Signed)
Quinapril stopped in the hospital due to low blood pressure and AKI Blood pressure elevated here today and kidney function returned to normal prior to discharge Repeat CMP today Restart quinapril Continue other medications

## 2019-05-15 NOTE — Assessment & Plan Note (Signed)
Resolved with hydration in the hospital Related to alcoholic ketoacidosis He is drinking plenty of fluids since leaving the hospital and his blood work prior to discharge was normal Will recheck CMP today

## 2019-05-15 NOTE — Assessment & Plan Note (Signed)
Continue magnesium daily Check magnesium level

## 2019-05-15 NOTE — Assessment & Plan Note (Signed)
Hospitalized 6/16-6/20 for alcohol intoxication and detox Successfully detoxed with Valium taper States now that he does not have the urge to drink and he does not think he will go back to drinking He is unsure if he needs to see a psychiatrist or go to [facility].  He wonders if there is a medication he can take if he does have the urge to drink Stressed the importance of seeing a psychiatrist and attending AA once able to prevent him from drinking again Couple of practices of psychiatrist given and discussed at length how sick he was in the risk of him starting to drink again is high

## 2019-06-17 ENCOUNTER — Other Ambulatory Visit: Payer: Self-pay

## 2019-06-17 ENCOUNTER — Encounter (HOSPITAL_COMMUNITY): Payer: Self-pay | Admitting: Nurse Practitioner

## 2019-06-17 ENCOUNTER — Ambulatory Visit (HOSPITAL_COMMUNITY)
Admission: RE | Admit: 2019-06-17 | Discharge: 2019-06-17 | Disposition: A | Discharge status: Home / Self Care | Payer: Medicare Other | Source: Ambulatory Visit | Attending: Nurse Practitioner | Admitting: Nurse Practitioner

## 2019-06-17 VITALS — BP 136/72 | HR 57 | Ht 72.0 in | Wt 184.0 lb

## 2019-06-17 DIAGNOSIS — Z7901 Long term (current) use of anticoagulants: Secondary | ICD-10-CM | POA: Insufficient documentation

## 2019-06-17 DIAGNOSIS — Z91013 Allergy to seafood: Secondary | ICD-10-CM | POA: Insufficient documentation

## 2019-06-17 DIAGNOSIS — Z888 Allergy status to other drugs, medicaments and biological substances status: Secondary | ICD-10-CM | POA: Insufficient documentation

## 2019-06-17 DIAGNOSIS — Z8249 Family history of ischemic heart disease and other diseases of the circulatory system: Secondary | ICD-10-CM | POA: Insufficient documentation

## 2019-06-17 DIAGNOSIS — Z833 Family history of diabetes mellitus: Secondary | ICD-10-CM | POA: Insufficient documentation

## 2019-06-17 DIAGNOSIS — Z87891 Personal history of nicotine dependence: Secondary | ICD-10-CM | POA: Diagnosis not present

## 2019-06-17 DIAGNOSIS — I4819 Other persistent atrial fibrillation: Secondary | ICD-10-CM | POA: Insufficient documentation

## 2019-06-17 DIAGNOSIS — I4891 Unspecified atrial fibrillation: Secondary | ICD-10-CM | POA: Diagnosis present

## 2019-06-17 DIAGNOSIS — K219 Gastro-esophageal reflux disease without esophagitis: Secondary | ICD-10-CM | POA: Insufficient documentation

## 2019-06-17 DIAGNOSIS — I10 Essential (primary) hypertension: Secondary | ICD-10-CM | POA: Diagnosis not present

## 2019-06-17 DIAGNOSIS — Z823 Family history of stroke: Secondary | ICD-10-CM | POA: Diagnosis not present

## 2019-06-17 DIAGNOSIS — Z85828 Personal history of other malignant neoplasm of skin: Secondary | ICD-10-CM | POA: Diagnosis not present

## 2019-06-17 DIAGNOSIS — Z79899 Other long term (current) drug therapy: Secondary | ICD-10-CM | POA: Diagnosis not present

## 2019-06-17 DIAGNOSIS — E785 Hyperlipidemia, unspecified: Secondary | ICD-10-CM | POA: Insufficient documentation

## 2019-06-17 LAB — BASIC METABOLIC PANEL
Anion gap: 8 (ref 5–15)
BUN: 8 mg/dL (ref 8–23)
CO2: 27 mmol/L (ref 22–32)
Calcium: 9 mg/dL (ref 8.9–10.3)
Chloride: 98 mmol/L (ref 98–111)
Creatinine, Ser: 0.83 mg/dL (ref 0.61–1.24)
GFR calc Af Amer: >60 mL/min (ref >60)
GFR calc non Af Amer: >60 mL/min (ref >60)
Glucose, Bld: 106 mg/dL — ABNORMAL HIGH (ref 70–99)
Potassium: 4.9 mmol/L (ref 3.5–5.1)
Sodium: 133 mmol/L — ABNORMAL LOW (ref 135–145)

## 2019-06-17 LAB — MAGNESIUM: Magnesium: 2.1 mg/dL (ref 1.7–2.4)

## 2019-06-17 MED ORDER — METOPROLOL TARTRATE 100 MG PO TABS: 100.0000 mg | ORAL_TABLET | Freq: Two times a day (BID) | ORAL | 1 refills | Status: DC

## 2019-06-17 MED ORDER — QUINAPRIL HCL 40 MG PO TABS: 40.0000 mg | ORAL_TABLET | Freq: Every morning | ORAL | 1 refills | Status: DC
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
## 2019-08-07 DIAGNOSIS — Z23 Encounter for immunization: Secondary | ICD-10-CM | POA: Diagnosis not present

## 2019-09-04 DIAGNOSIS — L570 Actinic keratosis: Secondary | ICD-10-CM | POA: Diagnosis not present

## 2019-09-04 DIAGNOSIS — L821 Other seborrheic keratosis: Secondary | ICD-10-CM | POA: Diagnosis not present

## 2019-09-04 DIAGNOSIS — L819 Disorder of pigmentation, unspecified: Secondary | ICD-10-CM | POA: Diagnosis not present

## 2019-09-04 DIAGNOSIS — D229 Melanocytic nevi, unspecified: Secondary | ICD-10-CM | POA: Diagnosis not present

## 2019-09-04 DIAGNOSIS — L814 Other melanin hyperpigmentation: Secondary | ICD-10-CM | POA: Diagnosis not present

## 2019-09-09 ENCOUNTER — Encounter: Payer: Self-pay | Admitting: Internal Medicine

## 2019-09-12 MED ORDER — TRAZODONE HCL 50 MG PO TABS: 50.0000 mg | ORAL_TABLET | Freq: Every evening | ORAL | 3 refills | Status: DC | PRN

## 2019-09-17 ENCOUNTER — Telehealth: Payer: Self-pay | Admitting: *Deleted

## 2019-09-17 NOTE — Telephone Encounter (Signed)
Called pt/wife (on DPR) and asked for call back to let us know if they could see the NP on Mon 11/9. Left office number in message. Advised NP has other appt options that day, options next week for sooner if they prefer, closest currently is 10:30 AM on 11/9.

## 2019-09-25 ENCOUNTER — Other Ambulatory Visit (HOSPITAL_COMMUNITY): Payer: Self-pay | Admitting: Nurse Practitioner

## 2019-09-29 NOTE — Patient Instructions (Addendum)
We will continue levetiracetam 500mg  twice daily  Continue close follow up with PCP and cardiology. Follow up with PCP for additional refills.     Seizure, Adult A seizure is a sudden burst of abnormal electrical activity in the brain. Seizures usually last from 30 seconds to 2 minutes. They can cause many different symptoms. Usually, seizures are not harmful unless they last a long time. What are the causes? Common causes of this condition include:  Fever or infection.  Conditions that affect the brain, such as: ? A brain abnormality that you were born with. ? A brain or head injury. ? Bleeding in the brain. ? A tumor. ? Stroke. ? Brain disorders such as autism or cerebral palsy.  Low blood sugar.  Conditions that are passed from parent to child (are inherited).  Problems with substances, such as: ? Having a reaction to a drug or a medicine. ? Suddenly stopping the use of a substance (withdrawal). In some cases, the cause may not be known. A person who has repeated seizures over time without a clear cause has a condition called epilepsy. What increases the risk? You are more likely to get this condition if you have:  A family history of epilepsy.  Had a seizure in the past.  A brain disorder.  A history of head injury, lack of oxygen at birth, or strokes. What are the signs or symptoms? There are many types of seizures. The symptoms vary depending on the type of seizure you have. Examples of symptoms during a seizure include:  Shaking (convulsions).  Stiffness in the body.  Passing out (losing consciousness).  Head nodding.  Staring.  Not responding to sound or touch.  Loss of bladder control and bowel control. Some people have symptoms right before and right after a seizure happens. Symptoms before a seizure may include:  Fear.  Worry (anxiety).  Feeling like you may vomit (nauseous).  Feeling like the room is spinning (vertigo).  Feeling like you  saw or heard something before (dj vu).  Odd tastes or smells.  Changes in how you see. You may see flashing lights or spots. Symptoms after a seizure happens can include:  Confusion.  Sleepiness.  Headache.  Weakness on one side of the body. How is this treated? Most seizures will stop on their own in under 5 minutes. In these cases, no treatment is needed. Seizures that last longer than 5 minutes will usually need treatment. Treatment can include:  Medicines given through an IV tube.  Avoiding things that are known to cause your seizures. These can include medicines that you take for another condition.  Medicines to treat epilepsy.  Surgery to stop the seizures. This may be needed if medicines do not help. Follow these instructions at home: Medicines  Take over-the-counter and prescription medicines only as told by your doctor.  Do not eat or drink anything that may keep your medicine from working, such as alcohol. Activity  Do not do any activities that would be dangerous if you had another seizure, like driving or swimming. Wait until your doctor says it is safe for you to do them.  If you live in the [place] ask your local DMV (department of motor vehicles) when you can drive.  Get plenty of rest. Teaching others Teach friends and family what to do when you have a seizure. They should:  Lay you on the ground.  Protect your head and body.  Loosen any tight clothing around your neck.  Turn  you on your side.  Not hold you down.  Not put anything into your mouth.  Know whether or not you need emergency care.  Stay with you until you are better.  General instructions  Contact your doctor each time you have a seizure.  Avoid anything that gives you seizures.  Keep a seizure diary. Write down: ? What you think caused each seizure. ? What you remember about each seizure.  Keep all follow-up visits as told by your doctor. This is important. Contact a  doctor if:  You have another seizure.  You have seizures more often.  There is any change in what happens during your seizures.  You keep having seizures with treatment.  You have symptoms of being sick or having an infection. Get help right away if:  You have a seizure that: ? Lasts longer than 5 minutes. ? Is different than seizures you had before. ? Makes it harder to breathe. ? Happens after you hurt your head.  You have any of these symptoms after a seizure: ? Not being able to speak. ? Not being able to use a part of your body. ? Confusion. ? A bad headache.  You have two or more seizures in a row.  You do not wake up right after a seizure.  You get hurt during a seizure. These symptoms may be an emergency. Do not wait to see if the symptoms will go away. Get medical help right away. Call your local emergency services (911 in the [place] Do not drive yourself to the hospital. Summary  Seizures usually last from 30 seconds to 2 minutes. Usually, they are not harmful unless they last a long time.  Do not eat or drink anything that may keep your medicine from working, such as alcohol.  Teach friends and family what to do when you have a seizure.  Contact your doctor each time you have a seizure. This information is not intended to replace advice given to you by your health care provider. Make sure you discuss any questions you have with your health care provider. Document Released: 04/25/2008 Document Revised: 01/25/2019 Document Reviewed: 01/25/2019 Elsevier Patient Education  [place].

## 2019-09-29 NOTE — Progress Notes (Addendum)
PATIENT: Sergio Mack DOB: January 31, 1946  REASON FOR VISIT: follow up HISTORY FROM: patient  Chief Complaint  Patient presents with   Follow-up    Follow up for seizure room 1 with [name] and her temp was not taken     HISTORY OF PRESENT ILLNESS: Today 09/30/19 Sergio Mack is a 73 y.o. male here today for follow up fr seizures. He had, what was thought to be, an alcohol withdrawal seizure in 06/2018. He had been sober for 35 years but had recently relapsed. He continues levetiracetam 500mg  BID.  He reports that he is tolerating this medication well with no obvious adverse effects.  He takes his medication every day at 8 AM and 8 PM.  He does continue to drink.  He reports about 1 vodka drink daily for about a pint a week.  On holidays or weekends he may drink a little more.  He has not had any seizure activity since last being seen a year ago.  He is followed closely by Dr. Quay Burow twice yearly as well as cardiology twice yearly.  He reports that screening labs are normal.  HISTORY: (copied from Dr Cathren Laine  note on 09/26/2018)  HPI:  Sergio Mack is a 73 y.o. male here as a referral from Dr. Quay Burow for seizure. 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   Wife provides most information. Got up at at 2am and he yelled, he was breathing gutterly he was sweating profusely, eyes closed and would not respond, his whole body was shaking like chills, he doesn;t remember anything. Wife called 911 thought he was having a heart attack, 15 minutes unresponsive and laying on the bed, eyes closed, no response at all, EMS came EKG was fine and blood pressure was fine, he went to the ED, per notes with EMG became unresponsive with seizure-like activity and posturing. No new meds, recent illnesses, no iiciting events.  He is a recovered alcoholic and started drinking a month ago, drinking "a lot" per wife.  A pint some days. He had  stopped drinking 36 hours prior and was drinking heavily the last week. Also decreased memory even before this. Memory worsening since the seizures. He continues to drink alcohol.   Reviewed notes, labs and imaging from outside physicians, which showed:  CT head  showed No acute intracranial abnormalities including mass lesion or mass effect, hydrocephalus, extra-axial fluid collection, midline shift, hemorrhage, or acute infarction, large ischemic events (personally reviewed images)  Cbc/cmp unremarkable   REVIEW OF SYSTEMS: Out of a complete 14 system review of symptoms, the patient complains only of the following symptoms, none and all other reviewed systems are negative.   ALLERGIES: Allergies  Allergen Reactions   Glucosamine Itching and Other (See Comments)    Itching feet and palms, tightness in chest also   Shellfish-Derived Products Anaphylaxis, Shortness Of Breath and Itching    Itching feet and palms, tightness in chest also  Can eat oysters, can do dyes for tests and can eat just a few shrimp    HOME MEDICATIONS: Outpatient Medications Prior to Visit  Medication Sig Dispense Refill   amLODipine (NORVASC) 5 MG tablet Take by mouth.     apixaban (ELIQUIS) 5 MG TABS tablet Take 1 tablet (5 mg total) by mouth 2 (two) times daily. 180 tablet 3   Cholecalciferol (VITAMIN D-3) 25 MCG (1000 UT) CAPS Take 1 capsule (1,000 Units total) by mouth daily.  90 capsule 3   cyanocobalamin 1000 MCG tablet Take 1 tablet (1,000 mcg total) by mouth daily. 90 tablet 3   dofetilide (TIKOSYN) 500 MCG capsule TAKE 1 CAPSULE TWICE DAILY [id number] capsule 2   folic acid (FOLVITE) 1 MG tablet Take 1 tablet (1 mg total) by mouth daily. 90 tablet 3   furosemide (LASIX) 20 MG tablet TAKE 1 TABLET EVERY DAY 90 tablet 2   magnesium oxide (MAG-OX) 400 MG tablet Take 1 tablet (400 mg total) by mouth daily. 90 tablet 3   metoprolol tartrate (LOPRESSOR) 100 MG tablet Take 1 tablet (100 mg total) by  mouth 2 (two) times daily. 180 tablet 1   Multiple Vitamins-Minerals (ICAPS AREDS 2 PO) Take 1 capsule by mouth 2 (two) times daily.     quinapril (ACCUPRIL) 40 MG tablet Take 1 tablet (40 mg total) by mouth every morning. 90 tablet 1   sodium chloride (OCEAN) 0.65 % SOLN nasal spray Place 1 spray into both nostrils as needed for congestion.     thiamine (VITAMIN B-1) 100 MG tablet      thiamine 100 MG tablet Take 1 tablet (100 mg total) by mouth daily. 90 tablet 3   traZODone (DESYREL) 50 MG tablet Take 1-2 tablets (50-100 mg total) by mouth at bedtime as needed for sleep. 60 tablet 3   levETIRAcetam (KEPPRA) 500 MG tablet Take 1 tablet (500 mg total) by mouth 2 (two) times daily. 180 tablet 4   amLODipine (NORVASC) 2.5 MG tablet Take 1 tablet (2.5 mg total) by mouth daily. (Patient not taking: Reported on 09/30/2019) 90 tablet 1   apixaban (ELIQUIS) 5 MG TABS tablet Eliquis 5 mg tablet     dofetilide (TIKOSYN) 500 MCG capsule Take by mouth.     folic acid (FOLVITE) 1 MG tablet Take by mouth.     furosemide (LASIX) 20 MG tablet Take by mouth.     levETIRAcetam (KEPPRA) 500 MG tablet Take by mouth.     magnesium oxide (MAG-OX) 400 MG tablet Take by mouth.     metoprolol tartrate (LOPRESSOR) 100 MG tablet Take by mouth.     Multiple Vitamins-Minerals (PRESERVISION AREDS) TABS      thiamine (VITAMIN B-1) 100 MG tablet Take by mouth.     No facility-administered medications prior to visit.     PAST MEDICAL HISTORY: Past Medical History:  Diagnosis Date   Diverticulosis 2006   Erectile dysfunction    GERD (gastroesophageal reflux disease)    History of skin cancer    History of syncope 1994   no etiology; no recurrence   Hyperlipidemia    LDL goal = < 100, ideally < 70   Hypertension    Persistent atrial fibrillation ([place]) 03/06/2014   PVC's (premature ventricular contractions)    Rotator cuff tear    Visit for monitoring Tikosyn therapy 12/19/2017     PAST SURGICAL HISTORY: Past Surgical History:  Procedure Laterality Date   CARDIOVERSION N/A 12/05/2017   Procedure: CARDIOVERSION;  Surgeon: Dorothy Spark, MD;  Location: [facility];  Service: Cardiovascular;  Laterality: N/A;   COLONOSCOPY  2006   Diverticulosis   CYSTECTOMY     lip   CYSTECTOMY     anterior thorax-chest   INGUINAL HERNIA REPAIR      x 2   PENILE PROSTHESIS IMPLANT N/A 12/09/2016   Procedure: PENILE PROTHESIS INFLATABLE THREE PIECE COLOPLAST;  Surgeon: Kathie Rhodes, MD;  Location: [facility];  Service: Urology;  Laterality: N/A;   SHOULDER  OPEN ROTATOR CUFF REPAIR Right 04/23/2014   Procedure: RIGHT SHOULDER ROTATOR CUFF REPAIR WITH GRAFT AND ANCHORS . OPEN ACROMIONECTOMY;  Surgeon: Tobi Bastos, MD;  Location: [facility];  Service: Orthopedics;  Laterality: Right;   SPINE SURGERY  2012   Dr Gladstone Lighter   TEE WITHOUT CARDIOVERSION N/A 12/05/2017   Procedure: TRANSESOPHAGEAL ECHOCARDIOGRAM (TEE);  Surgeon: Dorothy Spark, MD;  Location: [facility];  Service: Cardiovascular;  Laterality: N/A;   TONSILLECTOMY AND ADENOIDECTOMY      FAMILY HISTORY: Family History  Problem Relation Age of Onset   Heart attack Father 3   Diabetes Mother    Transient ischemic attack Mother 33   Diabetes Sister    Heart attack Sister 19   Stomach cancer Maternal Grandfather    Cirrhosis Paternal Uncle        alcoholic   Alzheimer's disease Brother    Colon cancer Neg Hx    Colon polyps Neg Hx     SOCIAL HISTORY: Social History   Socioeconomic History   Marital status: Married    Spouse name: Not on file   Number of children: 1   Years of education: Not on file   Highest education level: High school graduate  Occupational History   Not on file  Social Needs   Financial resource strain: Not on file   Food insecurity    Worry: Not on file    Inability: Not on file   Transportation needs    Medical: Not on file    Non-medical: Not on  file  Tobacco Use   Smoking status: Former Smoker    Packs/day: 1.00    Types: Cigarettes    Quit date: 1990    Years since quitting: 30.8   Smokeless tobacco: Former Systems developer    Types: Chew    Quit date: 1990   Tobacco comment: smoked Lapel with 12 year abstinence ( 21 total years of smoking), up to 1 ppd  Substance and Sexual Activity   Alcohol use: Not Currently    Comment: 09/26/18 NONE; previous 4 shots of alcohol a few days a week   Drug use: Never   Sexual activity: Not on file  Lifestyle   Physical activity    Days per week: Not on file    Minutes per session: Not on file   Stress: Not on file  Relationships   Social connections    Talks on phone: Not on file    Gets together: Not on file    Attends religious service: Not on file    Active member of club or organization: Not on file    Attends meetings of clubs or organizations: Not on file    Relationship status: Not on file   Intimate partner violence    Fear of current or ex partner: Not on file    Emotionally abused: Not on file    Physically abused: Not on file    Forced sexual activity: Not on file  Other Topics Concern   Not on file  Social History Narrative   Lives at home with his wife   Works in his garden and walks 4 days per week   Right handed   Caffeine: minimal      PHYSICAL EXAM  Vitals:   09/30/19 1016  BP: 124/83  Pulse: (!) 59  Temp: (!) 97.3 F (36.3 C)  Weight: 184 lb 12.8 oz (83.8 kg)   Body mass index is 25.06 kg/m.  Generalized: Well developed,  in no acute distress  Neurological examination  Mentation: Alert oriented to time, place, history taking. Follows all commands speech and language fluent Cranial nerve II-XII: Pupils were equal round reactive to light. Extraocular movements were full, visual field were full on confrontational test. Facial sensation and strength were normal. Uvula tongue midline. Head turning and shoulder shrug  were normal and  symmetric. Motor: The motor testing reveals 5 over 5 strength of all 4 extremities. Good symmetric motor tone is noted throughout.  Sensory: Sensory testing is intact to soft touch on all 4 extremities. No evidence of extinction is noted.  Coordination: Cerebellar testing reveals good finger-nose-finger and heel-to-shin bilaterally.  Gait and station: Gait is normal.    DIAGNOSTIC DATA (LABS, IMAGING, TESTING) - I reviewed patient records, labs, notes, testing and imaging myself where available.  No flowsheet data found.   Lab Results  Component Value Date   WBC 7.3 05/15/2019   HGB 11.2 (L) 05/15/2019   HCT 33.6 (L) 05/15/2019   MCV 95.0 05/15/2019   PLT 242.0 05/15/2019      Component Value Date/Time   NA 133 (L) 06/17/2019 1049   K 4.9 06/17/2019 1049   CL 98 06/17/2019 1049   CO2 27 06/17/2019 1049   GLUCOSE 106 (H) 06/17/2019 1049   BUN 8 06/17/2019 1049   CREATININE 0.83 06/17/2019 1049   CALCIUM 9.0 06/17/2019 1049   PROT 6.4 05/15/2019 1059   ALBUMIN 3.6 05/15/2019 1059   AST 37 05/15/2019 1059   ALT 31 05/15/2019 1059   ALKPHOS 76 05/15/2019 1059   BILITOT 0.8 05/15/2019 1059   GFRNONAA >60 06/17/2019 1049   GFRAA >60 06/17/2019 1049   Lab Results  Component Value Date   CHOL 123 10/24/2018   HDL 39.10 10/24/2018   LDLCALC 65 10/24/2018   TRIG 98.0 10/24/2018   CHOLHDL 3 10/24/2018   Lab Results  Component Value Date   HGBA1C 6.0 10/24/2018   Lab Results  Component Value Date   VITAMINB12 >2000 (H) 06/26/2018   Lab Results  Component Value Date   TSH 1.55 10/24/2018     ASSESSMENT AND PLAN 73 y.o. year old male  has a past medical history of Diverticulosis (2006), Erectile dysfunction, GERD (gastroesophageal reflux disease), History of skin cancer, History of syncope (1994), Hyperlipidemia, Hypertension, Persistent atrial fibrillation ([place]) (03/06/2014), PVC's (premature ventricular contractions), Rotator cuff tear, and Visit for monitoring  Tikosyn therapy (12/19/2017). here with     ICD-10-CM   1. Seizure ([place])  R56.9     Sovann is doing very well on levetiracetam 500 mg twice daily.  He does wish to continue this medication.  We have discussed seizures, and relationship to abrupt alcohol discontinuation.  He was advised to continue current therapy.  Should he wish to consider alcohol cessation, he will reach out to [place] or PCP for guidance or referral for cessation plan.  He was advised against abrupt alcohol withdrawal.  He wishes to have primary care continue levetiracetam refills.  I feel that this is completely appropriate if primary care is willing.  Medication has been refilled for 1 year.  He will follow-up with [place] as needed.  He and his wife both verbalized understanding and agreement with this plan.   No orders of the defined types were placed in this encounter.    Meds ordered this encounter  Medications   levETIRAcetam (KEPPRA) 500 MG tablet    Sig: Take 1 tablet (500 mg total) by mouth 2 (two) times  daily.    Dispense:  180 tablet    Refill:  4    Order Specific Question:   Supervising Provider    Answer:   Melvenia Beam [id number]      I spent 15 minutes with the patient. 50% of this time was spent counseling and educating patient on plan of care and medications.    Debbora Presto, FNP-C 09/30/2019, 10:50 AM [facility] [address], [place], [place] [address] [phone]  Made any corrections needed, and agree with history, physical, neuro exam,assessment and plan as stated.     Sarina Ill, MD [facility]

## 2019-09-30 ENCOUNTER — Encounter: Payer: Self-pay | Admitting: Family Medicine

## 2019-09-30 ENCOUNTER — Ambulatory Visit (INDEPENDENT_AMBULATORY_CARE_PROVIDER_SITE_OTHER): Payer: Medicare Other | Admitting: Family Medicine

## 2019-09-30 ENCOUNTER — Ambulatory Visit: Payer: Medicare Other | Admitting: Neurology

## 2019-09-30 ENCOUNTER — Other Ambulatory Visit: Payer: Self-pay

## 2019-09-30 VITALS — BP 124/83 | HR 59 | Temp 97.3°F | Wt 184.8 lb

## 2019-09-30 DIAGNOSIS — R569 Unspecified convulsions: Secondary | ICD-10-CM | POA: Diagnosis not present

## 2019-09-30 MED ORDER — LEVETIRACETAM 500 MG PO TABS: 500.0000 mg | ORAL_TABLET | Freq: Two times a day (BID) | ORAL | 4 refills | Status: DC

## 2019-10-01 ENCOUNTER — Other Ambulatory Visit (HOSPITAL_COMMUNITY): Payer: Self-pay | Admitting: Nurse Practitioner

## 2019-10-10 ENCOUNTER — Ambulatory Visit (HOSPITAL_COMMUNITY)
Admission: RE | Admit: 2019-10-10 | Discharge: 2019-10-10 | Disposition: A | Discharge status: Home / Self Care | Payer: Medicare Other | Source: Ambulatory Visit | Attending: Nurse Practitioner | Admitting: Nurse Practitioner

## 2019-10-10 ENCOUNTER — Other Ambulatory Visit: Payer: Self-pay

## 2019-10-10 ENCOUNTER — Encounter (HOSPITAL_COMMUNITY): Payer: Self-pay | Admitting: Nurse Practitioner

## 2019-10-10 VITALS — BP 188/90 | HR 66 | Ht 72.0 in | Wt 185.4 lb

## 2019-10-10 DIAGNOSIS — D6869 Other thrombophilia: Secondary | ICD-10-CM | POA: Diagnosis not present

## 2019-10-10 DIAGNOSIS — H903 Sensorineural hearing loss, bilateral: Secondary | ICD-10-CM | POA: Diagnosis not present

## 2019-10-10 DIAGNOSIS — I4891 Unspecified atrial fibrillation: Secondary | ICD-10-CM | POA: Diagnosis present

## 2019-10-10 DIAGNOSIS — Z85828 Personal history of other malignant neoplasm of skin: Secondary | ICD-10-CM | POA: Insufficient documentation

## 2019-10-10 DIAGNOSIS — Z833 Family history of diabetes mellitus: Secondary | ICD-10-CM | POA: Diagnosis not present

## 2019-10-10 DIAGNOSIS — E785 Hyperlipidemia, unspecified: Secondary | ICD-10-CM | POA: Diagnosis not present

## 2019-10-10 DIAGNOSIS — Z7901 Long term (current) use of anticoagulants: Secondary | ICD-10-CM | POA: Insufficient documentation

## 2019-10-10 DIAGNOSIS — I4819 Other persistent atrial fibrillation: Secondary | ICD-10-CM | POA: Diagnosis not present

## 2019-10-10 DIAGNOSIS — Z8249 Family history of ischemic heart disease and other diseases of the circulatory system: Secondary | ICD-10-CM | POA: Diagnosis not present

## 2019-10-10 DIAGNOSIS — Z87891 Personal history of nicotine dependence: Secondary | ICD-10-CM | POA: Diagnosis not present

## 2019-10-10 DIAGNOSIS — Z888 Allergy status to other drugs, medicaments and biological substances status: Secondary | ICD-10-CM | POA: Insufficient documentation

## 2019-10-10 DIAGNOSIS — Z79899 Other long term (current) drug therapy: Secondary | ICD-10-CM | POA: Insufficient documentation

## 2019-10-10 DIAGNOSIS — I10 Essential (primary) hypertension: Secondary | ICD-10-CM | POA: Diagnosis not present

## 2019-10-10 LAB — BASIC METABOLIC PANEL
Anion gap: 13 (ref 5–15)
BUN: 6 mg/dL — ABNORMAL LOW (ref 8–23)
CO2: 26 mmol/L (ref 22–32)
Calcium: 8.6 mg/dL — ABNORMAL LOW (ref 8.9–10.3)
Chloride: 95 mmol/L — ABNORMAL LOW (ref 98–111)
Creatinine, Ser: 0.9 mg/dL (ref 0.61–1.24)
GFR calc Af Amer: >60 mL/min (ref >60)
GFR calc non Af Amer: >60 mL/min (ref >60)
Glucose, Bld: 95 mg/dL (ref 70–99)
Potassium: 4.6 mmol/L (ref 3.5–5.1)
Sodium: 134 mmol/L — ABNORMAL LOW (ref 135–145)

## 2019-10-10 LAB — MAGNESIUM: Magnesium: 2 mg/dL (ref 1.7–2.4)

## 2019-10-10 MED ORDER — QUINAPRIL HCL 40 MG PO TABS: 40.0000 mg | ORAL_TABLET | Freq: Every morning | ORAL | 3 refills | Status: DC

## 2019-10-10 MED ORDER — METOPROLOL TARTRATE 100 MG PO TABS: 100.0000 mg | ORAL_TABLET | Freq: Two times a day (BID) | ORAL | 3 refills | Status: DC

## 2019-10-10 MED ORDER — APIXABAN 5 MG PO TABS: 5.0000 mg | ORAL_TABLET | Freq: Two times a day (BID) | ORAL | 3 refills | Status: DC

## 2019-10-10 MED ORDER — DOFETILIDE 500 MCG PO CAPS: 500.0000 ug | ORAL_CAPSULE | Freq: Two times a day (BID) | ORAL | 3 refills | Status: DC

## 2019-10-10 NOTE — Patient Instructions (Signed)
Watch BP call next week with readings
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
## 2019-10-14 ENCOUNTER — Other Ambulatory Visit (HOSPITAL_COMMUNITY): Payer: Self-pay | Admitting: *Deleted

## 2019-10-14 MED ORDER — AMLODIPINE BESYLATE 2.5 MG PO TABS: 2.5000 mg | ORAL_TABLET | Freq: Every day | ORAL | 2 refills | Status: DC

## 2019-10-16 ENCOUNTER — Other Ambulatory Visit: Payer: Self-pay

## 2019-10-23 DIAGNOSIS — R972 Elevated prostate specific antigen [PSA]: Secondary | ICD-10-CM | POA: Diagnosis not present

## 2019-10-24 ENCOUNTER — Other Ambulatory Visit: Payer: Self-pay | Admitting: Internal Medicine

## 2019-10-24 DIAGNOSIS — R7303 Prediabetes: Secondary | ICD-10-CM

## 2019-10-24 DIAGNOSIS — I4819 Other persistent atrial fibrillation: Secondary | ICD-10-CM

## 2019-10-24 DIAGNOSIS — I10 Essential (primary) hypertension: Secondary | ICD-10-CM

## 2019-10-30 DIAGNOSIS — N400 Enlarged prostate without lower urinary tract symptoms: Secondary | ICD-10-CM | POA: Diagnosis not present

## 2019-10-30 DIAGNOSIS — R972 Elevated prostate specific antigen [PSA]: Secondary | ICD-10-CM | POA: Diagnosis not present

## 2019-10-30 DIAGNOSIS — F5232 Male orgasmic disorder: Secondary | ICD-10-CM | POA: Diagnosis not present

## 2019-10-30 DIAGNOSIS — Z125 Encounter for screening for malignant neoplasm of prostate: Secondary | ICD-10-CM | POA: Diagnosis not present

## 2019-11-18 ENCOUNTER — Other Ambulatory Visit (INDEPENDENT_AMBULATORY_CARE_PROVIDER_SITE_OTHER): Payer: Medicare Other

## 2019-11-18 DIAGNOSIS — R7303 Prediabetes: Secondary | ICD-10-CM

## 2019-11-18 DIAGNOSIS — I4819 Other persistent atrial fibrillation: Secondary | ICD-10-CM

## 2019-11-18 DIAGNOSIS — I10 Essential (primary) hypertension: Secondary | ICD-10-CM

## 2019-11-18 LAB — COMPREHENSIVE METABOLIC PANEL
ALT: 49 U/L (ref 0–53)
AST: 35 U/L (ref 0–37)
Albumin: 3.9 g/dL (ref 3.5–5.2)
Alkaline Phosphatase: 64 U/L (ref 39–117)
BUN: 7 mg/dL (ref 6–23)
CO2: 30 mEq/L (ref 19–32)
Calcium: 9 mg/dL (ref 8.4–10.5)
Chloride: 100 mEq/L (ref 96–112)
Creatinine, Ser: 0.88 mg/dL (ref 0.40–1.50)
GFR: 84.6 mL/min (ref >60.00)
Glucose, Bld: 96 mg/dL (ref 70–99)
Potassium: 4.5 mEq/L (ref 3.5–5.1)
Sodium: 134 mEq/L — ABNORMAL LOW (ref 135–145)
Total Bilirubin: 0.6 mg/dL (ref 0.2–1.2)
Total Protein: 6.1 g/dL (ref 6.0–8.3)

## 2019-11-18 LAB — LIPID PANEL
Cholesterol: 127 mg/dL (ref 0–200)
HDL: 47.7 mg/dL (ref >39.00)
LDL Cholesterol: 66 mg/dL (ref 0–99)
NonHDL: 78.91
Total CHOL/HDL Ratio: 3
Triglycerides: 64 mg/dL (ref 0.0–149.0)
VLDL: 12.8 mg/dL (ref 0.0–40.0)

## 2019-11-18 LAB — CBC WITH DIFFERENTIAL/PLATELET
Basophils Absolute: 0.1 10*3/uL (ref 0.0–0.1)
Basophils Relative: 0.8 % (ref 0.0–3.0)
Eosinophils Absolute: 0 10*3/uL (ref 0.0–0.7)
Eosinophils Relative: 0.5 % (ref 0.0–5.0)
HCT: 30.4 % — ABNORMAL LOW (ref 39.0–52.0)
Hemoglobin: 10.2 g/dL — ABNORMAL LOW (ref 13.0–17.0)
Lymphocytes Relative: 18.9 % (ref 12.0–46.0)
Lymphs Abs: 1.6 10*3/uL (ref 0.7–4.0)
MCHC: 33.6 g/dL (ref 30.0–36.0)
MCV: 93.5 fl (ref 78.0–100.0)
Monocytes Absolute: 1.4 10*3/uL — ABNORMAL HIGH (ref 0.1–1.0)
Monocytes Relative: 16.9 % — ABNORMAL HIGH (ref 3.0–12.0)
Neutro Abs: 5.3 10*3/uL (ref 1.4–7.7)
Neutrophils Relative %: 62.9 % (ref 43.0–77.0)
Platelets: 301 10*3/uL (ref 150.0–400.0)
RBC: 3.25 Mil/uL — ABNORMAL LOW (ref 4.22–5.81)
RDW: 18.8 % — ABNORMAL HIGH (ref 11.5–15.5)
WBC: 8.4 10*3/uL (ref 4.0–10.5)

## 2019-11-18 LAB — TSH: TSH: 1.89 u[IU]/mL (ref 0.35–4.50)

## 2019-11-18 LAB — HEMOGLOBIN A1C: Hgb A1c MFr Bld: 5.3 % (ref 4.6–6.5)

## 2019-11-21 DIAGNOSIS — G479 Sleep disorder, unspecified: Secondary | ICD-10-CM | POA: Insufficient documentation

## 2019-11-21 NOTE — Progress Notes (Signed)
Subjective:    Patient ID: Sergio Mack, male    DOB: [birth year], 73 y.o.   MRN: [id number]  HPI The patient is here for follow up.    A couple of weeks ago he started having a runny nose.  He had a sore throat and clogged sinuess.  He has been couging up a little white sputum.  His symptoms are mild.  He has not left his home and knows this is not Covid.  He tried saline nasal spray. He wondered what otc cold meds he could take.   Anemia:  His recent blood work showed anemia.  He is taking eliquis.  He denies any bleeding.  He does bruise easily.    Persistent Afib, HFrEF, Hypertension: He is taking his medication daily. He is compliant with a low sodium diet.  He denies chest pain, palpitations, edema, shortness of breath and regular headaches.     Prediabetes:  He is compliant with a low sugar/carbohydrate diet.  He is working on weight loss.  H/o alcohol abuse:  He was dirinking in November, but has not drank any alcohol in December.   Difficulty sleeping:  He is taking trazodone nightly.  He takes 100 mg some nights, but not nightly.  It works just ok.  He often falls asleep on the couch watching tv and then has difficulty getting back to sleep.     Medications and allergies reviewed with patient and updated if appropriate.  Patient Active Problem List   Diagnosis Date Noted  . Difficulty sleeping 11/21/2019  . Hypomagnesemia 05/15/2019  . Increased anion gap metabolic acidosis [id number]  . Alcoholism ([place]) 05/14/2019  . Cough 10/31/2018  . Hyponatremia 10/30/2018  . Alcohol withdrawal seizure ([place]) 06/26/2018  . History of alcohol abuse 06/25/2018  . Leukocytosis 06/25/2018  . Persistent atrial fibrillation ([place]) 12/19/2017  . HFrEF (heart failure with reduced ejection fraction) ([place]) 12/04/2017  . Memory difficulties 10/26/2017  . 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 Sensorineural hearing loss (SNHL), 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  . Prediabetes 04/07/2016  . Full  thickness rotator cuff tear 04/23/2014  . Anticoagulation adequate, new on eliquis 03/07/2014  . SKIN CANCER, HX OF 03/24/2010  . DIVERTICULOSIS, COLON 12/11/2008  . SPINAL STENOSIS 01/14/2008  . BACK PAIN, LUMBAR, WITH RADICULOPATHY 01/02/2008  . Essential hypertension 10/31/2007  . 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 GERD 09/27/2007    Current Outpatient Medications on File Prior to Visit  Medication Sig Dispense Refill  . amLODipine (NORVASC) 2.5 MG tablet Take 1 tablet (2.5 mg total) by mouth daily. 90 tablet 2  . apixaban (ELIQUIS) 5 MG TABS tablet Take 1 tablet (5 mg total) by mouth 2 (two) times daily. 180 tablet 3  . Cholecalciferol (VITAMIN D-3) 25 MCG (1000 UT) CAPS Take 1 capsule (1,000 Units total) by mouth daily. 90 capsule 3  . cyanocobalamin 1000 MCG tablet Take 1 tablet (1,000 mcg total) by mouth daily. 90 tablet 3  . diclofenac Sodium (VOLTAREN) 1 % GEL Voltaren 1 % topical gel  APPLY 2 GRAM TO THE AFFECTED AREA(S) BY TOPICAL ROUTE 4 TIMES PER DAY    . dofetilide (TIKOSYN) 500 MCG capsule Take 1 capsule (500 mcg total) by mouth 2 (two) times daily. [id number] capsule 3  . folic acid (FOLVITE) 1 MG tablet Take 1 tablet (1 mg total) by mouth daily. 90 tablet 3  . furosemide (LASIX) 20 MG tablet TAKE 1 TABLET EVERY DAY 90 tablet 2  .  levETIRAcetam (KEPPRA) 500 MG tablet Take 1 tablet (500 mg total) by mouth 2 (two) times daily. 180 tablet 4  . magnesium oxide (MAG-OX) 400 MG tablet Take 1 tablet (400 mg total) by mouth daily. 90 tablet 3  . metoprolol tartrate (LOPRESSOR) 100 MG tablet Take 1 tablet (100 mg total) by mouth 2 (two) times daily. 180 tablet 3  . Multiple Vitamins-Minerals (ICAPS AREDS 2 PO) Take 1 capsule by mouth 2 (two) times daily.    . quinapril (ACCUPRIL) 40 MG tablet Take 1 tablet (40 mg total) by mouth every morning. 90 tablet 3  . sodium chloride (OCEAN) 0.65 % SOLN nasal spray Place 1 spray into both nostrils as needed for congestion.     . thiamine 100 MG tablet Take 1 tablet (100 mg total) by mouth daily. 90 tablet 3   No current facility-administered medications on file prior to visit.    Past Medical History:  Diagnosis Date  . Diverticulosis 2006  . Erectile dysfunction   . GERD (gastroesophageal reflux disease)   . History of skin cancer   . History of syncope 1994   no etiology; no recurrence  . Hyperlipidemia    LDL goal = < 100, ideally < 70  . Hypertension   . Persistent atrial fibrillation ([place]) 03/06/2014  . PVC's (premature ventricular contractions)   . Rotator cuff tear   . Visit for monitoring Tikosyn therapy 12/19/2017    Past Surgical History:  Procedure Laterality Date  . CARDIOVERSION N/A 12/05/2017   Procedure: CARDIOVERSION;  Surgeon: Dorothy Spark, MD;  Location: [facility] ENDOSCOPY;  Service: Cardiovascular;  Laterality: N/A;  . COLONOSCOPY  2006   Diverticulosis  . CYSTECTOMY     lip  . CYSTECTOMY     anterior thorax-chest  . INGUINAL HERNIA REPAIR      x 2  . 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 SHOULDER OPEN ROTATOR CUFF REPAIR Right 04/23/2014   Procedure: RIGHT SHOULDER ROTATOR CUFF REPAIR WITH GRAFT AND ANCHORS . OPEN ACROMIONECTOMY;  Surgeon: Tobi Bastos, MD;  Location: [facility] ORS;  Service: Orthopedics;  Laterality: Right;  . SPINE SURGERY  2012   Dr Gladstone Lighter  . TEE WITHOUT CARDIOVERSION N/A 12/05/2017   Procedure: TRANSESOPHAGEAL ECHOCARDIOGRAM (TEE);  Surgeon: Dorothy Spark, MD;  Location: [place];  Service: Cardiovascular;  Laterality: N/A;  . TONSILLECTOMY AND ADENOIDECTOMY      Social History   Socioeconomic History  . Marital status: Married    Spouse name: Not on file  . Number of children: 1  . Years of education: Not on file  . Highest education level: High school graduate  Occupational History  .  Not on file  Tobacco Use  . Smoking status: Former Smoker    Packs/day: 1.00    Types: Cigarettes    Quit date: 1990    Years since quitting: 31.0  . Smokeless tobacco: Former Systems developer    Types: Chew    Quit date: 69  . Tobacco comment: smoked [place] with 12 year abstinence ( 21 total years of smoking), up to 1 ppd  Substance and Sexual Activity  . Alcohol use: Not Currently    Comment: 09/26/18 NONE; previous 4 shots of alcohol a few days a week  . Drug use: Never  . Sexual activity: Not on file  Other Topics Concern  .  Not on file  Social History Narrative   Lives at home with his wife   Works in his garden and walks 4 days per week   Right handed   Caffeine: minimal   Social Determinants of Radio broadcast assistant Strain:   . Difficulty of Paying Living Expenses: Not on file  Food Insecurity:   . Worried About Charity fundraiser in the Last Year: Not on file  . Ran Out of Food in the Last Year: Not on file  Transportation Needs:   . Lack of Transportation (Medical): Not on file  . Lack of Transportation (Non-Medical): Not on file  Physical Activity:   . Days of Exercise per Week: Not on file  . Minutes of Exercise per Session: Not on file  Stress:   . Feeling of Stress : Not on file  Social Connections:   . Frequency of Communication with Friends and Family: Not on file  . Frequency of Social Gatherings with Friends and Family: Not on file  . Attends Religious Services: Not on file  . Active Member of Clubs or Organizations: Not on file  . Attends Archivist Meetings: Not on file  . Marital Status: Not on file    Family History  Problem Relation Age of Onset  . Heart attack Father 71  . Diabetes Mother   . Transient ischemic attack Mother 11  . Diabetes Sister   . Heart attack Sister 50  . Stomach cancer Maternal Grandfather   . Cirrhosis Paternal Uncle        alcoholic  . Alzheimer's disease Brother   . Colon cancer Neg Hx   . Colon  polyps Neg Hx     Review of Systems  Constitutional: Positive for fatigue (not worse). Negative for chills and fever.  Respiratory: Positive for cough (with cold ). Negative for shortness of breath and wheezing.   Cardiovascular: Negative for chest pain, palpitations and leg swelling.  Gastrointestinal: Negative for abdominal pain, blood in stool (no melena) and nausea.       Rare gerd  Genitourinary: Negative for hematuria.  Neurological: Negative for dizziness, light-headedness and headaches.       Objective:   Vitals:   11/25/19 1114  BP: (!) 168/88  Pulse: (!) 54  Resp: 14  Temp: 98.3 F (36.8 C)  SpO2: 99%   BP Readings from Last 3 Encounters:  11/25/19 (!) 168/88  10/10/19 (!) 188/90  09/30/19 124/83   Wt Readings from Last 3 Encounters:  11/25/19 174 lb 12.8 oz (79.3 kg)  10/10/19 185 lb 6.4 oz (84.1 kg)  09/30/19 184 lb 12.8 oz (83.8 kg)   Body mass index is 23.71 kg/m.   Physical Exam    Constitutional: Appears well-developed and well-nourished. No distress.  HENT:  Head: Normocephalic and atraumatic.  Neck: Neck supple. No tracheal deviation present. No thyromegaly present.  No cervical lymphadenopathy Cardiovascular: Normal rate, regular rhythm and normal heart sounds.   No murmur heard. No carotid bruit .  No edema Pulmonary/Chest: Effort normal and breath sounds normal. No respiratory distress. No has no wheezes. No rales.  Skin: Skin is warm and dry. Not diaphoretic.  Psychiatric: Normal mood and affect. Behavior is normal.      Assessment & Plan:    See Problem List for Assessment and Plan of chronic medical problems.    This visit occurred during the SARS-CoV-2 public health emergency.  Safety protocols were in place, including screening questions prior to  the visit, additional usage of staff PPE, and extensive cleaning of exam room while observing appropriate contact time as indicated for disinfecting solutions.

## 2019-11-21 NOTE — Patient Instructions (Addendum)
For cold symptoms you can take mucinex, coricidin cold products, saline nasal sprays.     Ideally BP should be < 140/90     Medications reviewed and updated.  Changes include :   none  Your prescription(s) have been submitted to your pharmacy. Please take as directed and contact our office if you believe you are having problem(s) with the medication(s).    Please followup in 6 months

## 2019-11-25 ENCOUNTER — Encounter: Payer: Self-pay | Admitting: Internal Medicine

## 2019-11-25 ENCOUNTER — Other Ambulatory Visit: Payer: Self-pay

## 2019-11-25 ENCOUNTER — Ambulatory Visit (INDEPENDENT_AMBULATORY_CARE_PROVIDER_SITE_OTHER): Payer: Medicare Other | Admitting: Internal Medicine

## 2019-11-25 VITALS — BP 168/88 | HR 54 | Temp 98.3°F | Resp 14 | Ht 72.0 in | Wt 174.8 lb

## 2019-11-25 DIAGNOSIS — R7303 Prediabetes: Secondary | ICD-10-CM

## 2019-11-25 DIAGNOSIS — I10 Essential (primary) hypertension: Secondary | ICD-10-CM

## 2019-11-25 DIAGNOSIS — I5020 Unspecified systolic (congestive) heart failure: Secondary | ICD-10-CM | POA: Diagnosis not present

## 2019-11-25 DIAGNOSIS — F1020 Alcohol dependence, uncomplicated: Secondary | ICD-10-CM

## 2019-11-25 DIAGNOSIS — G479 Sleep disorder, unspecified: Secondary | ICD-10-CM

## 2019-11-25 DIAGNOSIS — I4819 Other persistent atrial fibrillation: Secondary | ICD-10-CM

## 2019-11-25 DIAGNOSIS — D509 Iron deficiency anemia, unspecified: Secondary | ICD-10-CM | POA: Insufficient documentation

## 2019-11-25 DIAGNOSIS — D649 Anemia, unspecified: Secondary | ICD-10-CM

## 2019-11-25 LAB — CBC WITH DIFFERENTIAL/PLATELET
Basophils Absolute: 0.1 10*3/uL (ref 0.0–0.1)
Basophils Relative: 1 % (ref 0.0–3.0)
Eosinophils Absolute: 0.1 10*3/uL (ref 0.0–0.7)
Eosinophils Relative: 1.6 % (ref 0.0–5.0)
HCT: 34.5 % — ABNORMAL LOW (ref 39.0–52.0)
Hemoglobin: 11.3 g/dL — ABNORMAL LOW (ref 13.0–17.0)
Lymphocytes Relative: 27.4 % (ref 12.0–46.0)
Lymphs Abs: 2.2 10*3/uL (ref 0.7–4.0)
MCHC: 32.9 g/dL (ref 30.0–36.0)
MCV: 93.4 fl (ref 78.0–100.0)
Monocytes Absolute: 0.9 10*3/uL (ref 0.1–1.0)
Monocytes Relative: 10.5 % (ref 3.0–12.0)
Neutro Abs: 4.9 10*3/uL (ref 1.4–7.7)
Neutrophils Relative %: 59.5 % (ref 43.0–77.0)
Platelets: 259 10*3/uL (ref 150.0–400.0)
RBC: 3.69 Mil/uL — ABNORMAL LOW (ref 4.22–5.81)
RDW: 17.1 % — ABNORMAL HIGH (ref 11.5–15.5)
WBC: 8.2 10*3/uL (ref 4.0–10.5)

## 2019-11-25 MED ORDER — TRAZODONE HCL 100 MG PO TABS: 100.0000 mg | ORAL_TABLET | Freq: Every day | ORAL | 5 refills | Status: DC

## 2019-11-25 NOTE — Assessment & Plan Note (Signed)
Sugars have been in the normal range Continue low sugar/carb diet Has lost weight-BMI in normal range Follow-up in 6 months

## 2019-11-25 NOTE — Assessment & Plan Note (Addendum)
Drank alcohol in November, but did not drink any etoh in December Stressed abstinence

## 2019-11-25 NOTE — Assessment & Plan Note (Signed)
Not currently controlled-BP high here today He has not been checking his blood pressure at home Advised that he check his blood pressure in the daily for the next week and let me know the readings If blood pressure is consistently high will adjust medication CMP reviewed

## 2019-11-25 NOTE — Assessment & Plan Note (Signed)
Following with cardiology Asymptomatic Chronic, stable Continue Eliquis, metoprolol Mild anemia-repeat CBC, iron panel No symptoms or evidence of active bleeding

## 2019-11-25 NOTE — Assessment & Plan Note (Signed)
Recent labs show anemia On eliquis - denies bleeding Will recheck cbc, iron panel Stool cards for occult blood Last colonoscopy 11/2018

## 2019-11-25 NOTE — Assessment & Plan Note (Signed)
Chronic, stable Euvolemic here today Continue current medications

## 2019-11-26 LAB — IRON,TIBC AND FERRITIN PANEL
%SAT: 17 % (calc) — ABNORMAL LOW (ref 20–48)
Ferritin: 222 ng/mL (ref 24–380)
Iron: 40 ug/dL — ABNORMAL LOW (ref 50–180)
TIBC: 242 mcg/dL (calc) — ABNORMAL LOW (ref 250–425)

## 2019-11-27 DIAGNOSIS — L821 Other seborrheic keratosis: Secondary | ICD-10-CM | POA: Diagnosis not present

## 2019-11-27 DIAGNOSIS — D225 Melanocytic nevi of trunk: Secondary | ICD-10-CM | POA: Diagnosis not present

## 2019-11-27 DIAGNOSIS — L570 Actinic keratosis: Secondary | ICD-10-CM | POA: Diagnosis not present

## 2019-11-27 DIAGNOSIS — L820 Inflamed seborrheic keratosis: Secondary | ICD-10-CM | POA: Diagnosis not present

## 2019-11-27 DIAGNOSIS — L814 Other melanin hyperpigmentation: Secondary | ICD-10-CM | POA: Diagnosis not present

## 2019-11-27 DIAGNOSIS — R208 Other disturbances of skin sensation: Secondary | ICD-10-CM | POA: Diagnosis not present

## 2019-11-27 DIAGNOSIS — D1801 Hemangioma of skin and subcutaneous tissue: Secondary | ICD-10-CM | POA: Diagnosis not present

## 2019-11-27 DIAGNOSIS — D485 Neoplasm of uncertain behavior of skin: Secondary | ICD-10-CM | POA: Diagnosis not present

## 2019-11-28 ENCOUNTER — Encounter: Payer: Self-pay | Admitting: Internal Medicine

## 2019-11-28 ENCOUNTER — Other Ambulatory Visit: Payer: Self-pay | Admitting: Internal Medicine

## 2019-11-28 DIAGNOSIS — D509 Iron deficiency anemia, unspecified: Secondary | ICD-10-CM

## 2019-11-29 ENCOUNTER — Telehealth: Payer: Self-pay

## 2019-11-29 NOTE — Telephone Encounter (Signed)
Copied from [place] [id number]. Topic: General - Inquiry >> Nov 29, 2019 10:25 AM Richardo Priest, NT wrote: Reason for CRM: Pt's wife called in stating they are needing to return stool card, however they do not have a returning address on the envelop to where it needs to go. Pt's wife says they have looked everywhere and they do not have any label or address. Please advise.

## 2019-12-02 ENCOUNTER — Other Ambulatory Visit: Payer: Self-pay

## 2019-12-02 ENCOUNTER — Other Ambulatory Visit: Payer: Medicare Other

## 2019-12-02 ENCOUNTER — Other Ambulatory Visit: Payer: Self-pay | Admitting: Internal Medicine

## 2019-12-02 DIAGNOSIS — D509 Iron deficiency anemia, unspecified: Secondary | ICD-10-CM

## 2019-12-02 DIAGNOSIS — D649 Anemia, unspecified: Secondary | ICD-10-CM

## 2019-12-02 LAB — HEMOCCULT SLIDES (X 3 CARDS)
Fecal Occult Blood: NEGATIVE
OCCULT 1: NEGATIVE
OCCULT 2: NEGATIVE
OCCULT 3: NEGATIVE
OCCULT 4: NEGATIVE
OCCULT 5: NEGATIVE

## 2019-12-04 DIAGNOSIS — C44329 Squamous cell carcinoma of skin of other parts of face: Secondary | ICD-10-CM | POA: Diagnosis not present

## 2019-12-05 ENCOUNTER — Encounter: Payer: Self-pay | Admitting: Internal Medicine

## 2019-12-17 ENCOUNTER — Ambulatory Visit: Payer: Medicare Other

## 2019-12-18 ENCOUNTER — Ambulatory Visit: Payer: Medicare Other

## 2019-12-27 ENCOUNTER — Ambulatory Visit: Payer: Medicare Other | Attending: Internal Medicine

## 2019-12-27 DIAGNOSIS — Z23 Encounter for immunization: Secondary | ICD-10-CM | POA: Insufficient documentation

## 2019-12-27 NOTE — Progress Notes (Signed)
   Covid-19 Vaccination Clinic  Name:  LESLEE LEHR    MRN: [id number] DOB: 13-Mar-1946  12/27/2019  Mr. Grudzien was observed post Covid-19 immunization for 15 minutes without incidence. He was provided with Vaccine Information Sheet and instruction to access the V-Safe system.   Mr. Niemeier was instructed to call 911 with any severe reactions post vaccine: Marland Kitchen Difficulty breathing  . Swelling of your face and throat  . A fast heartbeat  . A bad rash all over your body  . Dizziness and weakness    Immunizations Administered    Name Date Dose VIS Date Route   Pfizer COVID-19 Vaccine 12/27/2019  9:17 AM 0.3 mL 11/01/2019 Intramuscular   Manufacturer: [place]   Lot: [id number]   [place]: [id number]

## 2019-12-31 ENCOUNTER — Encounter (HOSPITAL_COMMUNITY): Payer: Self-pay

## 2020-01-02 ENCOUNTER — Encounter: Payer: Self-pay | Admitting: Internal Medicine

## 2020-01-02 DIAGNOSIS — I4819 Other persistent atrial fibrillation: Secondary | ICD-10-CM

## 2020-01-21 ENCOUNTER — Ambulatory Visit: Payer: Medicare Other | Attending: Internal Medicine

## 2020-01-21 DIAGNOSIS — Z23 Encounter for immunization: Secondary | ICD-10-CM | POA: Insufficient documentation

## 2020-01-21 NOTE — Progress Notes (Signed)
   Covid-19 Vaccination Clinic  Name:  CHARIS CUOZZO    MRN: [id number] DOB: 1946-10-18  01/21/2020  Mr. Tuma was observed post Covid-19 immunization for 15 minutes without incident. He was provided with Vaccine Information Sheet and instruction to access the V-Safe system.   Mr. Tritle was instructed to call 911 with any severe reactions post vaccine: Marland Kitchen Difficulty breathing  . Swelling of face and throat  . A fast heartbeat  . A bad rash all over body  . Dizziness and weakness   Immunizations Administered    Name Date Dose VIS Date Route   Pfizer COVID-19 Vaccine 01/21/2020 10:00 AM 0.3 mL 11/01/2019 Intramuscular   Manufacturer: [employer]   Lot: [id number]   [place]: [id number]

## 2020-02-06 ENCOUNTER — Encounter (HOSPITAL_COMMUNITY): Payer: Self-pay | Admitting: Nurse Practitioner

## 2020-02-06 ENCOUNTER — Other Ambulatory Visit: Payer: Self-pay

## 2020-02-06 ENCOUNTER — Other Ambulatory Visit: Payer: Medicare Other

## 2020-02-06 ENCOUNTER — Ambulatory Visit (HOSPITAL_COMMUNITY)
Admission: RE | Admit: 2020-02-06 | Discharge: 2020-02-06 | Disposition: A | Discharge status: Home / Self Care | Payer: Medicare Other | Source: Ambulatory Visit | Attending: Nurse Practitioner | Admitting: Nurse Practitioner

## 2020-02-06 VITALS — BP 122/74 | HR 56 | Ht 72.0 in | Wt 200.4 lb

## 2020-02-06 DIAGNOSIS — N529 Male erectile dysfunction, unspecified: Secondary | ICD-10-CM | POA: Diagnosis not present

## 2020-02-06 DIAGNOSIS — K219 Gastro-esophageal reflux disease without esophagitis: Secondary | ICD-10-CM | POA: Insufficient documentation

## 2020-02-06 DIAGNOSIS — Z87891 Personal history of nicotine dependence: Secondary | ICD-10-CM | POA: Diagnosis not present

## 2020-02-06 DIAGNOSIS — Z85828 Personal history of other malignant neoplasm of skin: Secondary | ICD-10-CM | POA: Insufficient documentation

## 2020-02-06 DIAGNOSIS — I4510 Unspecified right bundle-branch block: Secondary | ICD-10-CM | POA: Insufficient documentation

## 2020-02-06 DIAGNOSIS — Z7901 Long term (current) use of anticoagulants: Secondary | ICD-10-CM | POA: Diagnosis not present

## 2020-02-06 DIAGNOSIS — I10 Essential (primary) hypertension: Secondary | ICD-10-CM | POA: Diagnosis not present

## 2020-02-06 DIAGNOSIS — E785 Hyperlipidemia, unspecified: Secondary | ICD-10-CM | POA: Diagnosis not present

## 2020-02-06 DIAGNOSIS — I4819 Other persistent atrial fibrillation: Secondary | ICD-10-CM | POA: Insufficient documentation

## 2020-02-06 DIAGNOSIS — Z79899 Other long term (current) drug therapy: Secondary | ICD-10-CM | POA: Insufficient documentation

## 2020-02-06 DIAGNOSIS — I491 Atrial premature depolarization: Secondary | ICD-10-CM | POA: Diagnosis not present

## 2020-02-06 DIAGNOSIS — D6869 Other thrombophilia: Secondary | ICD-10-CM

## 2020-02-06 LAB — CBC WITH DIFFERENTIAL/PLATELET
Abs Immature Granulocytes: 0.1 10*3/uL — ABNORMAL HIGH (ref 0.00–0.07)
Basophils Absolute: 0.1 10*3/uL (ref 0.0–0.1)
Basophils Relative: 1 %
Eosinophils Absolute: 0.2 10*3/uL (ref 0.0–0.5)
Eosinophils Relative: 2 %
HCT: 45.3 % (ref 39.0–52.0)
Hemoglobin: 14.6 g/dL (ref 13.0–17.0)
Immature Granulocytes: 1 %
Lymphocytes Relative: 25 %
Lymphs Abs: 2.6 10*3/uL (ref 0.7–4.0)
MCH: 30 pg (ref 26.0–34.0)
MCHC: 32.2 g/dL (ref 30.0–36.0)
MCV: 93.2 fL (ref 80.0–100.0)
Monocytes Absolute: 1.1 10*3/uL — ABNORMAL HIGH (ref 0.1–1.0)
Monocytes Relative: 11 %
Neutro Abs: 6.5 10*3/uL (ref 1.7–7.7)
Neutrophils Relative %: 60 %
Platelets: 252 10*3/uL (ref 150–400)
RBC: 4.86 MIL/uL (ref 4.22–5.81)
RDW: 13.2 % (ref 11.5–15.5)
WBC: 10.6 10*3/uL — ABNORMAL HIGH (ref 4.0–10.5)
nRBC: 0 % (ref 0.0–0.2)

## 2020-02-06 LAB — BASIC METABOLIC PANEL
Anion gap: 10 (ref 5–15)
BUN: 9 mg/dL (ref 8–23)
CO2: 25 mmol/L (ref 22–32)
Calcium: 9.3 mg/dL (ref 8.9–10.3)
Chloride: 100 mmol/L (ref 98–111)
Creatinine, Ser: 0.94 mg/dL (ref 0.61–1.24)
GFR calc Af Amer: >60 mL/min (ref >60)
GFR calc non Af Amer: >60 mL/min (ref >60)
Glucose, Bld: 91 mg/dL (ref 70–99)
Potassium: 5.1 mmol/L (ref 3.5–5.1)
Sodium: 135 mmol/L (ref 135–145)

## 2020-02-06 LAB — CBC
HCT: 45.1 % (ref 39.0–52.0)
Hemoglobin: 14.5 g/dL (ref 13.0–17.0)
MCH: 30.2 pg (ref 26.0–34.0)
MCHC: 32.2 g/dL (ref 30.0–36.0)
MCV: 94 fL (ref 80.0–100.0)
Platelets: 250 10*3/uL (ref 150–400)
RBC: 4.8 MIL/uL (ref 4.22–5.81)
RDW: 13.2 % (ref 11.5–15.5)
WBC: 10.5 10*3/uL (ref 4.0–10.5)
nRBC: 0 % (ref 0.0–0.2)

## 2020-02-06 LAB — MAGNESIUM: Magnesium: 2.1 mg/dL (ref 1.7–2.4)

## 2020-02-06 NOTE — Addendum Note (Signed)
Encounter addended by: Sherran Needs, NP on: 02/06/2020 11:14 AM  Actions taken: Results reviewed in IB
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
## 2020-02-07 ENCOUNTER — Encounter (HOSPITAL_COMMUNITY): Payer: Self-pay

## 2020-02-19 DIAGNOSIS — H18413 Arcus senilis, bilateral: Secondary | ICD-10-CM | POA: Diagnosis not present

## 2020-02-28 ENCOUNTER — Other Ambulatory Visit: Payer: Self-pay | Admitting: Internal Medicine

## 2020-03-26 DIAGNOSIS — L821 Other seborrheic keratosis: Secondary | ICD-10-CM | POA: Diagnosis not present

## 2020-03-26 DIAGNOSIS — L853 Xerosis cutis: Secondary | ICD-10-CM | POA: Diagnosis not present

## 2020-03-26 DIAGNOSIS — L578 Other skin changes due to chronic exposure to nonionizing radiation: Secondary | ICD-10-CM | POA: Diagnosis not present

## 2020-03-26 DIAGNOSIS — D225 Melanocytic nevi of trunk: Secondary | ICD-10-CM | POA: Diagnosis not present

## 2020-03-26 DIAGNOSIS — L905 Scar conditions and fibrosis of skin: Secondary | ICD-10-CM | POA: Diagnosis not present

## 2020-03-26 DIAGNOSIS — L570 Actinic keratosis: Secondary | ICD-10-CM | POA: Diagnosis not present

## 2020-03-26 DIAGNOSIS — L814 Other melanin hyperpigmentation: Secondary | ICD-10-CM | POA: Diagnosis not present

## 2020-03-26 DIAGNOSIS — Z85828 Personal history of other malignant neoplasm of skin: Secondary | ICD-10-CM | POA: Diagnosis not present

## 2020-03-26 DIAGNOSIS — D1801 Hemangioma of skin and subcutaneous tissue: Secondary | ICD-10-CM | POA: Diagnosis not present

## 2020-04-15 ENCOUNTER — Other Ambulatory Visit: Payer: Self-pay

## 2020-04-15 ENCOUNTER — Emergency Department (HOSPITAL_COMMUNITY)
Admission: EM | Admit: 2020-04-15 | Discharge: 2020-04-15 | Disposition: A | Discharge status: Home / Self Care | Payer: Medicare Other | Attending: Emergency Medicine | Admitting: Emergency Medicine

## 2020-04-15 ENCOUNTER — Telehealth: Payer: Self-pay | Admitting: Internal Medicine

## 2020-04-15 ENCOUNTER — Encounter (HOSPITAL_COMMUNITY): Payer: Self-pay

## 2020-04-15 DIAGNOSIS — F1020 Alcohol dependence, uncomplicated: Secondary | ICD-10-CM | POA: Diagnosis not present

## 2020-04-15 DIAGNOSIS — Z5321 Procedure and treatment not carried out due to patient leaving prior to being seen by health care provider: Secondary | ICD-10-CM | POA: Insufficient documentation

## 2020-04-15 DIAGNOSIS — R45851 Suicidal ideations: Secondary | ICD-10-CM | POA: Insufficient documentation

## 2020-04-15 LAB — CBC
HCT: 46.1 % (ref 39.0–52.0)
Hemoglobin: 15.1 g/dL (ref 13.0–17.0)
MCH: 29.3 pg (ref 26.0–34.0)
MCHC: 32.8 g/dL (ref 30.0–36.0)
MCV: 89.3 fL (ref 80.0–100.0)
Platelets: 224 10*3/uL (ref 150–400)
RBC: 5.16 MIL/uL (ref 4.22–5.81)
RDW: 14.6 % (ref 11.5–15.5)
WBC: 12.1 10*3/uL — ABNORMAL HIGH (ref 4.0–10.5)
nRBC: 0 % (ref 0.0–0.2)

## 2020-04-15 LAB — COMPREHENSIVE METABOLIC PANEL
ALT: 26 U/L (ref 0–44)
AST: 55 U/L — ABNORMAL HIGH (ref 15–41)
Albumin: 3.6 g/dL (ref 3.5–5.0)
Alkaline Phosphatase: 73 U/L (ref 38–126)
Anion gap: 17 — ABNORMAL HIGH (ref 5–15)
BUN: 14 mg/dL (ref 8–23)
CO2: 17 mmol/L — ABNORMAL LOW (ref 22–32)
Calcium: 8 mg/dL — ABNORMAL LOW (ref 8.9–10.3)
Chloride: 91 mmol/L — ABNORMAL LOW (ref 98–111)
Creatinine, Ser: 0.91 mg/dL (ref 0.61–1.24)
GFR calc Af Amer: >60 mL/min (ref >60)
GFR calc non Af Amer: >60 mL/min (ref >60)
Glucose, Bld: 86 mg/dL (ref 70–99)
Potassium: 5 mmol/L (ref 3.5–5.1)
Sodium: 125 mmol/L — ABNORMAL LOW (ref 135–145)
Total Bilirubin: 1.5 mg/dL — ABNORMAL HIGH (ref 0.3–1.2)
Total Protein: 6.5 g/dL (ref 6.5–8.1)

## 2020-04-15 LAB — ETHANOL: Alcohol, Ethyl (B): 321 mg/dL (ref <10)

## 2020-04-15 NOTE — ED Notes (Signed)
Wife waiting outside, please call when pt goes to a room

## 2020-04-15 NOTE — Telephone Encounter (Signed)
    Spouse calling with urgent questions regarding sending patient to rehab for detox

## 2020-04-15 NOTE — Telephone Encounter (Signed)
Spoke with wife in regards and advised per Dr. Quay Burow to go to the ED for detox. Wife expressed understanding.

## 2020-04-15 NOTE — ED Triage Notes (Signed)
Pt accompanied by wife who reports pt has been drinking about 1 fifth of vodka every day for the past week. 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 Pt very hard of hearing, hx of a fib and seizures, last seizure was in august of last year. Pt reports SI to wife stating "I wish I would just end it all, I would be better of" no plan. Pt drowsy in triage.

## 2020-04-18 ENCOUNTER — Encounter (HOSPITAL_COMMUNITY): Payer: Self-pay

## 2020-04-18 ENCOUNTER — Emergency Department (HOSPITAL_COMMUNITY): Payer: Medicare Other

## 2020-04-18 ENCOUNTER — Inpatient Hospital Stay (HOSPITAL_COMMUNITY)
Admission: EM | Admit: 2020-04-18 | Discharge: 2020-04-23 | DRG: 896 | Disposition: A | Discharge status: Skilled Nursing Facility | Payer: Medicare Other | Attending: Internal Medicine | Admitting: Internal Medicine

## 2020-04-18 ENCOUNTER — Other Ambulatory Visit: Payer: Self-pay

## 2020-04-18 ENCOUNTER — Observation Stay (HOSPITAL_COMMUNITY): Payer: Medicare Other

## 2020-04-18 DIAGNOSIS — I4510 Unspecified right bundle-branch block: Secondary | ICD-10-CM | POA: Diagnosis present

## 2020-04-18 DIAGNOSIS — R404 Transient alteration of awareness: Secondary | ICD-10-CM | POA: Diagnosis not present

## 2020-04-18 DIAGNOSIS — G9341 Metabolic encephalopathy: Secondary | ICD-10-CM | POA: Diagnosis present

## 2020-04-18 DIAGNOSIS — Z7901 Long term (current) use of anticoagulants: Secondary | ICD-10-CM

## 2020-04-18 DIAGNOSIS — Z833 Family history of diabetes mellitus: Secondary | ICD-10-CM

## 2020-04-18 DIAGNOSIS — K219 Gastro-esophageal reflux disease without esophagitis: Secondary | ICD-10-CM | POA: Diagnosis present

## 2020-04-18 DIAGNOSIS — F10939 Alcohol use, unspecified with withdrawal, unspecified: Secondary | ICD-10-CM | POA: Diagnosis present

## 2020-04-18 DIAGNOSIS — R531 Weakness: Secondary | ICD-10-CM | POA: Diagnosis present

## 2020-04-18 DIAGNOSIS — Z8719 Personal history of other diseases of the digestive system: Secondary | ICD-10-CM

## 2020-04-18 DIAGNOSIS — F10239 Alcohol dependence with withdrawal, unspecified: Principal | ICD-10-CM | POA: Diagnosis present

## 2020-04-18 DIAGNOSIS — I110 Hypertensive heart disease with heart failure: Secondary | ICD-10-CM | POA: Diagnosis present

## 2020-04-18 DIAGNOSIS — D509 Iron deficiency anemia, unspecified: Secondary | ICD-10-CM | POA: Diagnosis present

## 2020-04-18 DIAGNOSIS — Z800 Family history of malignant neoplasm of digestive organs: Secondary | ICD-10-CM

## 2020-04-18 DIAGNOSIS — W19XXXA Unspecified fall, initial encounter: Secondary | ICD-10-CM | POA: Diagnosis not present

## 2020-04-18 DIAGNOSIS — Z888 Allergy status to other drugs, medicaments and biological substances status: Secondary | ICD-10-CM

## 2020-04-18 DIAGNOSIS — R4182 Altered mental status, unspecified: Secondary | ICD-10-CM

## 2020-04-18 DIAGNOSIS — I5020 Unspecified systolic (congestive) heart failure: Secondary | ICD-10-CM | POA: Diagnosis present

## 2020-04-18 DIAGNOSIS — R197 Diarrhea, unspecified: Secondary | ICD-10-CM | POA: Diagnosis present

## 2020-04-18 DIAGNOSIS — E785 Hyperlipidemia, unspecified: Secondary | ICD-10-CM | POA: Diagnosis present

## 2020-04-18 DIAGNOSIS — Z79899 Other long term (current) drug therapy: Secondary | ICD-10-CM

## 2020-04-18 DIAGNOSIS — I10 Essential (primary) hypertension: Secondary | ICD-10-CM | POA: Diagnosis present

## 2020-04-18 DIAGNOSIS — R4781 Slurred speech: Secondary | ICD-10-CM | POA: Diagnosis not present

## 2020-04-18 DIAGNOSIS — Z820 Family history of epilepsy and other diseases of the nervous system: Secondary | ICD-10-CM

## 2020-04-18 DIAGNOSIS — R413 Other amnesia: Secondary | ICD-10-CM | POA: Diagnosis present

## 2020-04-18 DIAGNOSIS — R471 Dysarthria and anarthria: Secondary | ICD-10-CM | POA: Diagnosis present

## 2020-04-18 DIAGNOSIS — Z20822 Contact with and (suspected) exposure to covid-19: Secondary | ICD-10-CM | POA: Diagnosis present

## 2020-04-18 DIAGNOSIS — Z66 Do not resuscitate: Secondary | ICD-10-CM | POA: Diagnosis present

## 2020-04-18 DIAGNOSIS — R410 Disorientation, unspecified: Secondary | ICD-10-CM

## 2020-04-18 DIAGNOSIS — Z8249 Family history of ischemic heart disease and other diseases of the circulatory system: Secondary | ICD-10-CM

## 2020-04-18 DIAGNOSIS — F1020 Alcohol dependence, uncomplicated: Secondary | ICD-10-CM | POA: Insufficient documentation

## 2020-04-18 DIAGNOSIS — Y909 Presence of alcohol in blood, level not specified: Secondary | ICD-10-CM | POA: Diagnosis present

## 2020-04-18 DIAGNOSIS — N390 Urinary tract infection, site not specified: Secondary | ICD-10-CM

## 2020-04-18 DIAGNOSIS — B957 Other staphylococcus as the cause of diseases classified elsewhere: Secondary | ICD-10-CM | POA: Diagnosis present

## 2020-04-18 DIAGNOSIS — Z85828 Personal history of other malignant neoplasm of skin: Secondary | ICD-10-CM

## 2020-04-18 DIAGNOSIS — R5381 Other malaise: Secondary | ICD-10-CM

## 2020-04-18 DIAGNOSIS — I4819 Other persistent atrial fibrillation: Secondary | ICD-10-CM | POA: Diagnosis present

## 2020-04-18 DIAGNOSIS — F1011 Alcohol abuse, in remission: Secondary | ICD-10-CM | POA: Diagnosis present

## 2020-04-18 DIAGNOSIS — E876 Hypokalemia: Secondary | ICD-10-CM | POA: Diagnosis not present

## 2020-04-18 DIAGNOSIS — J9811 Atelectasis: Secondary | ICD-10-CM | POA: Diagnosis not present

## 2020-04-18 DIAGNOSIS — H9190 Unspecified hearing loss, unspecified ear: Secondary | ICD-10-CM | POA: Diagnosis present

## 2020-04-18 DIAGNOSIS — E860 Dehydration: Secondary | ICD-10-CM | POA: Diagnosis present

## 2020-04-18 LAB — CBC
HCT: 42.1 % (ref 39.0–52.0)
Hemoglobin: 14 g/dL (ref 13.0–17.0)
MCH: 28.9 pg (ref 26.0–34.0)
MCHC: 33.3 g/dL (ref 30.0–36.0)
MCV: 87 fL (ref 80.0–100.0)
Platelets: 140 10*3/uL — ABNORMAL LOW (ref 150–400)
RBC: 4.84 MIL/uL (ref 4.22–5.81)
RDW: 14.5 % (ref 11.5–15.5)
WBC: 10.8 10*3/uL — ABNORMAL HIGH (ref 4.0–10.5)
nRBC: 0 % (ref 0.0–0.2)

## 2020-04-18 LAB — URINALYSIS, ROUTINE W REFLEX MICROSCOPIC
Bilirubin Urine: NEGATIVE
Glucose, UA: NEGATIVE mg/dL
Ketones, ur: 20 mg/dL — AB
Nitrite: POSITIVE — AB
Protein, ur: NEGATIVE mg/dL
Specific Gravity, Urine: 1.014 (ref 1.005–1.030)
WBC, UA: >50 WBC/hpf — ABNORMAL HIGH (ref 0–5)
pH: 6 (ref 5.0–8.0)

## 2020-04-18 LAB — COMPREHENSIVE METABOLIC PANEL
ALT: 17 U/L (ref 0–44)
AST: 26 U/L (ref 15–41)
Albumin: 3.6 g/dL (ref 3.5–5.0)
Alkaline Phosphatase: 61 U/L (ref 38–126)
Anion gap: 14 (ref 5–15)
BUN: 22 mg/dL (ref 8–23)
CO2: 23 mmol/L (ref 22–32)
Calcium: 9 mg/dL (ref 8.9–10.3)
Chloride: 98 mmol/L (ref 98–111)
Creatinine, Ser: 1.13 mg/dL (ref 0.61–1.24)
GFR calc Af Amer: >60 mL/min (ref >60)
GFR calc non Af Amer: >60 mL/min (ref >60)
Glucose, Bld: 145 mg/dL — ABNORMAL HIGH (ref 70–99)
Potassium: 4.1 mmol/L (ref 3.5–5.1)
Sodium: 135 mmol/L (ref 135–145)
Total Bilirubin: 1.6 mg/dL — ABNORMAL HIGH (ref 0.3–1.2)
Total Protein: 6.7 g/dL (ref 6.5–8.1)

## 2020-04-18 LAB — PROTIME-INR
INR: 1.3 — ABNORMAL HIGH (ref 0.8–1.2)
Prothrombin Time: 15.3 seconds — ABNORMAL HIGH (ref 11.4–15.2)

## 2020-04-18 LAB — CBG MONITORING, ED: Glucose-Capillary: 143 mg/dL — ABNORMAL HIGH (ref 70–99)

## 2020-04-18 LAB — RAPID URINE DRUG SCREEN, HOSP PERFORMED
Amphetamines: NOT DETECTED
Barbiturates: NOT DETECTED
Benzodiazepines: POSITIVE — AB
Cocaine: NOT DETECTED
Opiates: NOT DETECTED
Tetrahydrocannabinol: NOT DETECTED

## 2020-04-18 LAB — SARS CORONAVIRUS 2 BY RT PCR (HOSPITAL ORDER, PERFORMED IN ~~LOC~~ HOSPITAL LAB): SARS Coronavirus 2: NEGATIVE

## 2020-04-18 LAB — MAGNESIUM: Magnesium: 1.8 mg/dL (ref 1.7–2.4)

## 2020-04-18 LAB — ETHANOL: Alcohol, Ethyl (B): <10 mg/dL (ref <10)

## 2020-04-18 LAB — SALICYLATE LEVEL: Salicylate Lvl: <7 mg/dL — ABNORMAL LOW (ref 7.0–30.0)

## 2020-04-18 LAB — ACETAMINOPHEN LEVEL: Acetaminophen (Tylenol), Serum: <10 ug/mL — ABNORMAL LOW (ref 10–30)

## 2020-04-18 LAB — AMMONIA: Ammonia: 21 umol/L (ref 9–35)

## 2020-04-18 LAB — PHOSPHORUS: Phosphorus: 2 mg/dL — ABNORMAL LOW (ref 2.5–4.6)

## 2020-04-18 MED ORDER — ONDANSETRON HCL 4 MG PO TABS: 4.0000 mg | ORAL_TABLET | Freq: Four times a day (QID) | ORAL | Status: DC | PRN

## 2020-04-18 MED ORDER — MAGNESIUM CITRATE PO SOLN: 1.0000 | Freq: Once | ORAL | Status: DC | PRN

## 2020-04-18 MED ORDER — SODIUM CHLORIDE 0.9 % IV BOLUS
1000.0000 mL | Freq: Once | INTRAVENOUS | Status: AC
  Administered 2020-04-18: 1000 mL via INTRAVENOUS

## 2020-04-18 MED ORDER — DOFETILIDE 500 MCG PO CAPS
500.0000 ug | ORAL_CAPSULE | Freq: Two times a day (BID) | ORAL | Status: DC
  Administered 2020-04-18 – 2020-04-23 (×10): 500 ug via ORAL
  Filled 2020-04-18 (×15): qty 1

## 2020-04-18 MED ORDER — CHLORDIAZEPOXIDE HCL 25 MG PO CAPS: 25.0000 mg | ORAL_CAPSULE | Freq: Four times a day (QID) | ORAL | Status: DC | PRN

## 2020-04-18 MED ORDER — SODIUM CHLORIDE 0.9% FLUSH
3.0000 mL | Freq: Two times a day (BID) | INTRAVENOUS | Status: DC
  Administered 2020-04-18 – 2020-04-23 (×7): 3 mL via INTRAVENOUS

## 2020-04-18 MED ORDER — SODIUM CHLORIDE 0.9 % IV SOLN
1.0000 g | INTRAVENOUS | Status: AC
  Administered 2020-04-19 – 2020-04-20 (×2): 1 g via INTRAVENOUS
  Filled 2020-04-18 (×2): qty 1

## 2020-04-18 MED ORDER — THIAMINE HCL 100 MG/ML IJ SOLN: 100.0000 mg | Freq: Once | INTRAMUSCULAR | Status: DC

## 2020-04-18 MED ORDER — SALINE SPRAY 0.65 % NA SOLN
1.0000 | NASAL | Status: DC | PRN
  Filled 2020-04-18: qty 44

## 2020-04-18 MED ORDER — CHLORDIAZEPOXIDE HCL 25 MG PO CAPS
25.0000 mg | ORAL_CAPSULE | Freq: Three times a day (TID) | ORAL | Status: AC
  Administered 2020-04-20 (×3): 25 mg via ORAL
  Filled 2020-04-18 (×3): qty 1

## 2020-04-18 MED ORDER — HYDROXYZINE HCL 25 MG PO TABS: 25.0000 mg | ORAL_TABLET | Freq: Four times a day (QID) | ORAL | Status: AC | PRN

## 2020-04-18 MED ORDER — PANTOPRAZOLE SODIUM 40 MG PO TBEC
40.0000 mg | DELAYED_RELEASE_TABLET | Freq: Every day | ORAL | Status: DC
  Administered 2020-04-19 – 2020-04-23 (×5): 40 mg via ORAL
  Filled 2020-04-18 (×5): qty 1

## 2020-04-18 MED ORDER — MAGNESIUM OXIDE 400 (241.3 MG) MG PO TABS
400.0000 mg | ORAL_TABLET | Freq: Every day | ORAL | Status: DC
  Administered 2020-04-18 – 2020-04-23 (×6): 400 mg via ORAL
  Filled 2020-04-18 (×7): qty 1

## 2020-04-18 MED ORDER — VITAMIN D-3 25 MCG (1000 UT) PO CAPS: 1.0000 | ORAL_CAPSULE | Freq: Every day | ORAL | Status: DC

## 2020-04-18 MED ORDER — THIAMINE HCL 100 MG PO TABS
100.0000 mg | ORAL_TABLET | Freq: Every day | ORAL | Status: DC
  Administered 2020-04-19 – 2020-04-21 (×3): 100 mg via ORAL
  Filled 2020-04-18 (×4): qty 1

## 2020-04-18 MED ORDER — APIXABAN 5 MG PO TABS
5.0000 mg | ORAL_TABLET | Freq: Two times a day (BID) | ORAL | Status: DC
  Administered 2020-04-18 – 2020-04-23 (×10): 5 mg via ORAL
  Filled 2020-04-18 (×11): qty 1

## 2020-04-18 MED ORDER — LEVETIRACETAM 500 MG PO TABS
500.0000 mg | ORAL_TABLET | Freq: Two times a day (BID) | ORAL | Status: DC
  Administered 2020-04-18 – 2020-04-23 (×10): 500 mg via ORAL
  Filled 2020-04-18 (×10): qty 1

## 2020-04-18 MED ORDER — ONDANSETRON HCL 4 MG/2ML IJ SOLN: 4.0000 mg | Freq: Four times a day (QID) | INTRAMUSCULAR | Status: DC | PRN

## 2020-04-18 MED ORDER — ADULT MULTIVITAMIN W/MINERALS CH
1.0000 | ORAL_TABLET | Freq: Every day | ORAL | Status: DC
  Administered 2020-04-19 – 2020-04-23 (×5): 1 via ORAL
  Filled 2020-04-18 (×5): qty 1

## 2020-04-18 MED ORDER — VITAMIN B-12 1000 MCG PO TABS
1000.0000 ug | ORAL_TABLET | Freq: Every day | ORAL | Status: DC
  Administered 2020-04-18 – 2020-04-23 (×6): 1000 ug via ORAL
  Filled 2020-04-18 (×6): qty 1

## 2020-04-18 MED ORDER — METOPROLOL TARTRATE 100 MG PO TABS
100.0000 mg | ORAL_TABLET | Freq: Two times a day (BID) | ORAL | Status: DC
  Administered 2020-04-18 – 2020-04-23 (×10): 100 mg via ORAL
  Filled 2020-04-18 (×10): qty 1

## 2020-04-18 MED ORDER — LORAZEPAM 1 MG PO TABS
1.0000 mg | ORAL_TABLET | ORAL | Status: DC | PRN
  Administered 2020-04-20: 1 mg via ORAL
  Filled 2020-04-18: qty 1

## 2020-04-18 MED ORDER — LOPERAMIDE HCL 2 MG PO CAPS: 2.0000 mg | ORAL_CAPSULE | ORAL | Status: AC | PRN

## 2020-04-18 MED ORDER — ACETAMINOPHEN 325 MG PO TABS
650.0000 mg | ORAL_TABLET | Freq: Four times a day (QID) | ORAL | Status: DC | PRN
  Administered 2020-04-18 – 2020-04-21 (×2): 650 mg via ORAL
  Filled 2020-04-18 (×2): qty 2

## 2020-04-18 MED ORDER — LEVALBUTEROL HCL 0.63 MG/3ML IN NEBU: 0.6300 mg | INHALATION_SOLUTION | Freq: Four times a day (QID) | RESPIRATORY_TRACT | Status: DC | PRN

## 2020-04-18 MED ORDER — DEXTROSE-NACL 5-0.45 % IV SOLN: INTRAVENOUS | Status: DC

## 2020-04-18 MED ORDER — SORBITOL 70 % SOLN
30.0000 mL | Freq: Every day | Status: DC | PRN
  Filled 2020-04-18: qty 30

## 2020-04-18 MED ORDER — CHLORDIAZEPOXIDE HCL 25 MG PO CAPS
25.0000 mg | ORAL_CAPSULE | Freq: Every day | ORAL | Status: AC
  Administered 2020-04-22: 25 mg via ORAL
  Filled 2020-04-18: qty 1

## 2020-04-18 MED ORDER — CHLORDIAZEPOXIDE HCL 25 MG PO CAPS
25.0000 mg | ORAL_CAPSULE | Freq: Four times a day (QID) | ORAL | Status: AC
  Administered 2020-04-18 – 2020-04-19 (×6): 25 mg via ORAL
  Filled 2020-04-18 (×6): qty 1

## 2020-04-18 MED ORDER — ACETAMINOPHEN 650 MG RE SUPP: 650.0000 mg | Freq: Four times a day (QID) | RECTAL | Status: DC | PRN

## 2020-04-18 MED ORDER — CHLORDIAZEPOXIDE HCL 25 MG PO CAPS
25.0000 mg | ORAL_CAPSULE | ORAL | Status: AC
  Administered 2020-04-21 (×2): 25 mg via ORAL
  Filled 2020-04-18 (×2): qty 1

## 2020-04-18 MED ORDER — VITAMIN D 25 MCG (1000 UNIT) PO TABS
1000.0000 [IU] | ORAL_TABLET | Freq: Every day | ORAL | Status: DC
  Administered 2020-04-18 – 2020-04-23 (×6): 1000 [IU] via ORAL
  Filled 2020-04-18 (×6): qty 1

## 2020-04-18 MED ORDER — POLYETHYLENE GLYCOL 3350 17 G PO PACK: 17.0000 g | PACK | Freq: Every day | ORAL | Status: DC | PRN

## 2020-04-18 MED ORDER — ONDANSETRON 4 MG PO TBDP: 4.0000 mg | ORAL_TABLET | Freq: Four times a day (QID) | ORAL | Status: AC | PRN

## 2020-04-18 MED ORDER — SODIUM CHLORIDE 0.9 % IV SOLN
1.0000 g | Freq: Once | INTRAVENOUS | Status: AC
  Administered 2020-04-18: 1 g via INTRAVENOUS
  Filled 2020-04-18: qty 10

## 2020-04-18 MED ORDER — THIAMINE HCL 100 MG/ML IJ SOLN
Freq: Once | INTRAVENOUS | Status: AC
  Filled 2020-04-18: qty 1000

## 2020-04-18 MED ORDER — LORAZEPAM 2 MG/ML IJ SOLN
1.0000 mg | INTRAMUSCULAR | Status: DC | PRN
  Administered 2020-04-18: 1 mg via INTRAVENOUS
  Administered 2020-04-21: 2 mg via INTRAVENOUS
  Filled 2020-04-18 (×2): qty 1

## 2020-04-18 NOTE — ED Triage Notes (Signed)
Pt arrives from home via EMS with stoke like symptoms. Pt LSN 04/17/20 at 1400.  Per wife pt signed up for a ETOH  Program on Thursday 04/16/20. Pt given withdrawal medicine . Pt reports he has detoxed several times before. Wife found pt on floor today at 0800. Unknown downtime. Pt drinks 1 pint or more of vodka daily. Last drink 04/16/20.

## 2020-04-18 NOTE — ED Notes (Signed)
After Visual Acuity screening left eye was: 20/40 and right eye was 20/32

## 2020-04-18 NOTE — H&P (Signed)
History and Physical    FORBES PARZIALE [id number] DOB: 1945-12-08 DOA: 04/18/2020  PCP: Binnie Rail, MD  Patient coming from: Home  I have personally briefly reviewed patient's old medical records in [place]  Chief Complaint: Confusion/weakness  HPI: Sergio Mack is a 74 y.o. male with medical history significant of atrial fibrillation on chronic anticoagulation with Eliquis, history of alcohol abuse, gastroesophageal reflux disease, hyperlipidemia, who presents to the ED with worsening confusion for the past 1 to 2 days, decreased oral intake, generalized weakness, thick/slurred speech.  Patient pleasantly confused and as such history was obtained from wife who is at bedside.  Per wife patient recently went on a 1 week long binge history with alcohol, last drink was 2 days prior to admission and on Librium for alcohol detox per PCP who fell off his bed on the day of admission, with generalized weakness and patient also noted to have some dysarthric/slurred speech.  No syncopal episode noted.  No fevers, no chills, no emesis, no chest pain, no shortness of breath, no constipation, no syncopal episode, no asymmetric weakness or numbness, no melena, no hematemesis, no hematochezia, no dysuria.  Per wife patient did have a bout of nausea, patient also noted to have some bouts of diarrhea ongoing for the past week, generalized weakness.  ED Course: Patient seen in the ED EKG done with right bundle branch block which is noted on prior EKG, CT head done negative for any acute abnormalities.  MRI brain done negative for any acute abnormalities.  With large leukocytes, positive nitrite, greater than 50 WBCs, hazy in appearance.  Comprehensive metabolic profile with a glucose of 145, bilirubin of 1.6 otherwise unremarkable.  CBC with a white count of 10.8 otherwise unremarkable.  INR of 1.3.  Acetaminophen level less than 10.  Salicylate level less than 7.  SARS coronavirus 2 PCR pending.   UDS positive for benzos otherwise negative.  Patient given a dose of IV Rocephin, banana bag, fluid bolus, hospitalist were called to admit patient for further evaluation and management.  Review of Systems: As per HPI otherwise all other systems reviewed and are negative.  Past Medical History:  Diagnosis Date  . Diverticulosis 2006  . Erectile dysfunction   . GERD (gastroesophageal reflux disease)   . History of skin cancer   . History of syncope 1994   no etiology; no recurrence  . Hyperlipidemia    LDL goal = < 100, ideally < 70  . Hypertension   . Persistent atrial fibrillation ([place]) 03/06/2014  . PVC's (premature ventricular contractions)   . Rotator cuff tear   . Visit for monitoring Tikosyn therapy 12/19/2017    Past Surgical History:  Procedure Laterality Date  . CARDIOVERSION N/A 12/05/2017   Procedure: CARDIOVERSION;  Surgeon: Dorothy Spark, MD;  Location: [facility] ENDOSCOPY;  Service: Cardiovascular;  Laterality: N/A;  . COLONOSCOPY  2006   Diverticulosis  . CYSTECTOMY     lip  . CYSTECTOMY     anterior thorax-chest  . INGUINAL HERNIA REPAIR      x 2  . 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 SHOULDER OPEN ROTATOR CUFF REPAIR Right 04/23/2014   Procedure: RIGHT SHOULDER ROTATOR CUFF REPAIR WITH GRAFT AND ANCHORS . OPEN ACROMIONECTOMY;  Surgeon: Tobi Bastos, MD;  Location: [facility] ORS;  Service: Orthopedics;  Laterality:  Right;  Marland Kitchen SPINE SURGERY  2012   Dr Gladstone Lighter  . TEE WITHOUT CARDIOVERSION N/A 12/05/2017   Procedure: TRANSESOPHAGEAL ECHOCARDIOGRAM (TEE);  Surgeon: Dorothy Spark, MD;  Location: [facility] ENDOSCOPY;  Service: Cardiovascular;  Laterality: N/A;  . TONSILLECTOMY AND ADENOIDECTOMY      Social History  reports that he quit smoking about 31 years ago. His smoking use included cigarettes. He smoked 1.00 pack  per day. He quit smokeless tobacco use about 31 years ago.  His smokeless tobacco use included chew. He reports previous alcohol use. He reports that he does not use drugs.  Allergies  Allergen Reactions  . Glucosamine Itching and Other (See Comments)    Itching feet and palms, tightness in chest also  . Shellfish-Derived Products Anaphylaxis, Shortness Of Breath and Itching    Itching feet and palms, tightness in chest also  Can eat oysters, can do dyes for tests and can eat just a few shrimp    Family History  Problem Relation Age of Onset  . Heart attack Father 20  . Diabetes Mother   . Transient ischemic attack Mother 72  . Diabetes Sister   . Heart attack Sister 68  . Stomach cancer Maternal Grandfather   . Cirrhosis Paternal Uncle        alcoholic  . Alzheimer's disease Brother   . Colon cancer Neg Hx   . Colon polyps Neg Hx    Father deceased age 49 from an acute MI.  Mother deceased at age 28 from TIA versus CVA, sister deceased age 64 cause unknown, brother deceased in his 0000000 from complications of Alzheimer's.  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  Prior to Admission medications   Medication Sig Start Date End Date Taking? Authorizing Provider  amLODipine (NORVASC) 2.5 MG tablet Take 1 tablet (2.5 mg total) by mouth daily. 10/14/19  Yes Sherran Needs, NP  apixaban (ELIQUIS) 5 MG TABS tablet Take 1 tablet (5 mg total) by mouth 2 (two) times daily. 10/10/19  Yes Sherran Needs, NP  chlordiazePOXIDE (LIBRIUM) 25 MG capsule Take 25-50 mg by mouth See admin instructions. Taking 2 caps (50mg ) every 12hrs, for 2 days, then 1 cap (25mg ) twice daily for 2 days, then 1 cap (25mg ) every night. 04/16/20  Yes [provider]  Cholecalciferol (VITAMIN D-3) 25 MCG (1000 UT) CAPS Take 1 capsule (1,000 Units total) by mouth daily. 05/15/19  Yes Burns, Claudina Lick, MD  dofetilide (TIKOSYN) 500 MCG capsule Take 1 capsule (500 mcg total) by mouth 2  (two) times daily. 10/10/19  Yes Sherran Needs, NP  folic acid (FOLVITE) 1 MG tablet TAKE 1 TABLET EVERY DAY Patient taking differently: Take 1 mg by mouth daily.  02/28/20  Yes Burns, Claudina Lick, MD  furosemide (LASIX) 20 MG tablet TAKE 1 TABLET EVERY DAY Patient taking differently: Take 20 mg by mouth daily.  09/25/19  Yes Sherran Needs, NP  GNP VITAMIN B-1 100 MG tablet TAKE 1 TABLET EVERY DAY Patient taking differently: Take 100 mg by mouth daily.  02/28/20  Yes Burns, Claudina Lick, MD  levETIRAcetam (KEPPRA) 500 MG tablet Take 1 tablet (500 mg total) by mouth 2 (two) times daily. 09/30/19  Yes Lomax, Amy, NP  magnesium oxide (MAG-OX) 400 MG tablet Take 1 tablet (400 mg total) by mouth daily. 05/15/19  Yes Burns, Claudina Lick, MD  metoprolol tartrate (LOPRESSOR) 100 MG tablet Take 1 tablet (100 mg total) by mouth  2 (two) times daily. 10/10/19  Yes Sherran Needs, NP  Multiple Vitamins-Minerals (ICAPS AREDS 2 PO) Take 1 capsule by mouth 2 (two) times daily.   Yes [provider]  quinapril (ACCUPRIL) 40 MG tablet Take 1 tablet (40 mg total) by mouth every morning. 10/10/19  Yes Sherran Needs, NP  sodium chloride (OCEAN) 0.65 % SOLN nasal spray Place 1 spray into both nostrils as needed for congestion.   Yes [provider]  vitamin B-12 (CYANOCOBALAMIN) 1000 MCG tablet TAKE 1 TABLET EVERY DAY Patient taking differently: Take 1,000 mcg by mouth daily.  02/28/20  Yes Burns, Claudina Lick, MD  cholecalciferol (VITAMIN D) 25 MCG (1000 UNIT) tablet TAKE 1 TABLET EVERY DAY Patient not taking: Reported on 04/18/2020 02/28/20   Binnie Rail, MD  magnesium oxide (MAG-OX) 400 (241.3 Mg) MG tablet TAKE 1 TABLET EVERY DAY Patient not taking: Reported on 04/18/2020 02/28/20   Binnie Rail, MD  traZODone (DESYREL) 100 MG tablet Take 1 tablet (100 mg total) by mouth at bedtime. Patient not taking: Reported on 04/18/2020 11/25/19   Binnie Rail, MD    Physical Exam: Vitals:   04/18/20 1023 04/18/20 1036  04/18/20 1137 04/18/20 1218  BP: 119/67  117/75 99/62  Pulse: 78  73 78  Resp: 16  (!) 26 20  Temp: 98.6 F (37 C)     TempSrc: Oral     SpO2: 97%  93% 95%  Weight:  88 kg    Height:  6\' 1"  (1.854 m)      Constitutional: NAD, pleasantly confused. Vitals:   04/18/20 1023 04/18/20 1036 04/18/20 1137 04/18/20 1218  BP: 119/67  117/75 99/62  Pulse: 78  73 78  Resp: 16  (!) 26 20  Temp: 98.6 F (37 C)     TempSrc: Oral     SpO2: 97%  93% 95%  Weight:  88 kg    Height:  6\' 1"  (1.854 m)     Eyes: PERRLA, lids and conjunctivae normal ENMT: Mucous membranes are dry. Posterior pharynx clear of any exudate or lesions.Normal dentition.  Neck: normal, supple, no masses, no thyromegaly Respiratory: clear to auscultation bilaterally anterior lung fields, no wheezing, no crackles. Normal respiratory effort. No accessory muscle use.  Cardiovascular: Regular rate and rhythm, no murmurs / rubs / gallops. No extremity edema. 2+ pedal pulses. No carotid bruits.  Abdomen: no tenderness, no masses palpated. No hepatosplenomegaly. Bowel sounds positive.  Musculoskeletal: no clubbing / cyanosis. No joint deformity upper and lower extremities. Good ROM, no contractures. Normal muscle tone.  Skin: no rashes, lesions, ulcers. No induration Neurologic: CN 2-12 grossly intact. Sensation intact, DTR normal. Strength 5/5 in all 4.  Psychiatric: Poor judgment and insight. Alert and oriented x 3. Normal mood.   Labs on Admission: I have personally reviewed following labs and imaging studies  CBC: Recent Labs  Lab 04/15/20 1101 04/18/20 1024  WBC 12.1* 10.8*  HGB 15.1 14.0  HCT 46.1 42.1  MCV 89.3 87.0  PLT 224 140*    Basic Metabolic Panel: Recent Labs  Lab 04/15/20 1101 04/18/20 1024  NA 125* 135  K 5.0 4.1  CL 91* 98  CO2 17* 23  GLUCOSE 86 145*  BUN 14 22  CREATININE 0.91 1.13  CALCIUM 8.0* 9.0    GFR: Estimated Creatinine Clearance: 64.8 mL/min (by C-G formula based on SCr of 1.13  mg/dL).  Liver Function Tests: Recent Labs  Lab 04/15/20 1101 04/18/20 1024  AST  55* 26  ALT 26 17  ALKPHOS 73 61  BILITOT 1.5* 1.6*  PROT 6.5 6.7  ALBUMIN 3.6 3.6    Urine analysis:    Component Value Date/Time   COLORURINE YELLOW 04/18/2020 1057   APPEARANCEUR HAZY (A) 04/18/2020 1057   APPEARANCEUR Clear 06/26/2018 1101   LABSPEC 1.014 04/18/2020 1057   PHURINE 6.0 04/18/2020 1057   GLUCOSEU NEGATIVE 04/18/2020 1057   HGBUR MODERATE (A) 04/18/2020 1057   BILIRUBINUR NEGATIVE 04/18/2020 1057   BILIRUBINUR Negative 06/26/2018 1101   KETONESUR 20 (A) 04/18/2020 1057   PROTEINUR NEGATIVE 04/18/2020 1057   UROBILINOGEN 0.2 02/26/2014 0917   NITRITE POSITIVE (A) 04/18/2020 1057   LEUKOCYTESUR LARGE (A) 04/18/2020 1057    Radiological Exams on Admission: CT HEAD WO CONTRAST  Result Date: 04/18/2020 CLINICAL DATA:  Altered mental status. EXAM: CT HEAD WITHOUT CONTRAST TECHNIQUE: Contiguous axial images were obtained from the base of the skull through the vertex without intravenous contrast. COMPARISON:  None. FINDINGS: Brain: No acute intracranial hemorrhage. No focal mass lesion. No CT evidence of acute infarction. No midline shift or mass effect. No hydrocephalus. Basilar cisterns are patent. There are periventricular and subcortical white matter hypodensities. Generalized cortical atrophy. Vascular: No hyperdense vessel or unexpected calcification. Skull: Normal. Negative for fracture or focal lesion. Sinuses/Orbits: Paranasal sinuses and mastoid air cells are clear. Orbits are clear. Other: None. IMPRESSION: 1. No acute intracranial findings. 2. Mild atrophy and moderate periventricular white matter small vessel ischemia Electronically Signed   By: Suzy Bouchard M.D.   On: 04/18/2020 11:20   MR BRAIN WO CONTRAST  Result Date: 04/18/2020 CLINICAL DATA:  Altered mental status EXAM: MRI HEAD WITHOUT CONTRAST TECHNIQUE: Multiplanar, multiecho pulse sequences of the brain and  surrounding structures were obtained without intravenous contrast. COMPARISON:  2019 FINDINGS: Brain: There is no acute infarction or intracranial hemorrhage. There is no intracranial mass, mass effect, or edema. There is no hydrocephalus or extra-axial fluid collection. Patchy and confluent areas of T2 hyperintensity in the supratentorial white matter are nonspecific but may reflect moderate chronic microvascular ischemic changes. Prominence of the ventricles and sulci reflects minor generalized parenchymal volume loss Vascular: Major vessel flow voids at the skull base are preserved. Skull and upper cervical spine: Normal marrow signal is preserved. Sinuses/Orbits: Paranasal sinuses are aerated. Bilateral lens replacements. Other: Sella is unremarkable.  Mastoid air cells are clear. IMPRESSION: No evidence of recent infarction, hemorrhage, or mass. Moderate chronic microvascular ischemic changes. Electronically Signed   By: Macy Mis M.D.   On: 04/18/2020 14:39    EKG: Independently reviewed.  Normal sinus rhythm with right bundle branch block unchanged from prior EKG.  Assessment/Plan Principal Problem:   Acute metabolic encephalopathy Active Problems:   Acute lower UTI   Essential hypertension   GERD   Memory difficulties   HFrEF (heart failure with reduced ejection fraction) (HCC)   Persistent atrial fibrillation (HCC)   History of alcohol abuse   Iron deficiency anemia   Alcohol withdrawal (HCC)   Weakness generalized   Dehydration    #1 acute metabolic encephalopathy Likely multifactorial secondary to acute UTI in the setting of probable alcohol withdrawal.  Patient noted per wife to have been started on Librium detox protocol 2 days prior to admission.  Patient's last drink was 2 days prior to admission where patient had gone on a 1 week history of alcohol binging.  Urinalysis done on admission concerning for UTI.  Head CT/MRI brain negative for any acute abnormalities.  Patient  afebrile.  Check a chest x-ray.  Check an ammonia level.  Urine cultures pending.  IV fluids, IV Rocephin, placed on the Librium detox protocol.  Ativan withdrawal protocol.  Supportive care.  Follow.  2.  UTI Urine cultures pending.  IV Rocephin.  3.  Gastroesophageal reflux disease PPI.  4.  Alcohol withdrawal/alcohol abuse Patient presented with confusion, generalized weakness.  Per wife patient went on a 1 week long alcohol binge.  Last drink was 2 days prior to admission.  Per wife patient with prior history of alcoholism and per wife patient was sober for approximately 35 years and subsequently resumed drinking 2 years ago intermittently.  Patient noted per wife had just been started on Librium for alcohol withdrawal 2 days prior to admission per PCP.  Check a magnesium level, check a ammonia level.  Placed on the Librium detox protocol.  Placed on the Ativan withdrawal protocol.  Thiamine, folic acid, multivitamin.  Supportive care.  Follow.  Wife states patient has signed up for a intensive outpatient alcohol detox program.  5.  Dehydration IV fluids.  6.  Generalized weakness Likely secondary to UTI and alcohol.  PT/OT.  Supportive care.  7.  Persistent atrial fibrillation Currently in sinus rhythm.  Continue home regimen of Tikosyn and metoprolol for rate control.  Eliquis for anticoagulation.  Follow.  8.  Leukocytosis Likely secondary to UTI versus reactive.  Urine cultures pending.  Check a chest x-ray.  IV Rocephin.  9.  Hypertension Blood pressure noted to be borderline on admission.  Resume home regimen metoprolol.  Hold Norvasc and Lasix.  Monitor volume status with gentle hydration.  DVT prophylaxis: Eliquis Code Status:   DNR Family Communication:  Updated wife at bedside. Disposition Plan:   Patient is from:  Home  Anticipated DC to:  Home  Anticipated DC date:  To be determined  Anticipated DC barriers: Alcohol withdrawal  Consults called: None Admission  status: Observation  Severity of Illness: Patient presenting with acute metabolic encephalopathy, UTI, ongoing confusion, probable alcohol withdrawal.  Patient will be admitted placed on the Ativan and Librium detox protocol, IV antibiotics.  Urine cultures pending.  Time spent: 60 minutes.    Irine Seal MD [facility]  How to contact the [facility] Attending or Consulting provider [place] or covering provider during after hours [place], for this patient?   1. Check the care team in [facility] and look for a) attending/consulting [facility] provider listed and b) the [facility] team listed 2. Log into [email or web address] and use [place]'s universal password to access. If you do not have the password, please contact the hospital operator. 3. Locate the [facility] provider you are looking for under [facility] and page to a number that you can be directly reached. 4. If you still have difficulty reaching the provider, please page the [facility] (Director on Call) for the Hospitalists listed on amion for assistance.  04/18/2020, 5:27 PM

## 2020-04-18 NOTE — ED Notes (Signed)
Pt wife would like to be called when he gets a room upstairs.

## 2020-04-18 NOTE — ED Provider Notes (Signed)
[name] Provider Note   CSN: [id number] Arrival date & time: 04/18/20  1014     History Chief Complaint  Patient presents with  . Weakness    Sergio Mack is a 74 y.o. male.  Patient presents with altered mental status and generalized weakness. Patient with hx etoh abuse, and quite drinking a few days ago - was given unspecified medication to help with detox. Has been feeling generally weak for past few days with poor po intake. Spouse indicates was unusually restless in bed last night, turning and tossing. No seizure activity noted. This AM, spouse left bedroom around 6 AM, and when returned at 0800, pt on floor. She indicates in the middle of the night patient had gotten up to go to bathroom, and seems unsteady then, holding onto things. Last at baseline sometime yesterday. Also spouse notes speech sounds a bit thick or slightly slurred since yesterday.  Pt denies specific complaint or injury. No headache. No neck or back pain. No chest pain or sob. No abd pain or nv. Hx prior seizure, takes keppra. Also on anticoag therapy.   The history is provided by the patient, the spouse and the EMS personnel.       Past Medical History:  Diagnosis Date  . Diverticulosis 2006  . Erectile dysfunction   . GERD (gastroesophageal reflux disease)   . History of skin cancer   . History of syncope 1994   no etiology; no recurrence  . Hyperlipidemia    LDL goal = < 100, ideally < 70  . Hypertension   . Persistent atrial fibrillation ([place]) 03/06/2014  . PVC's (premature ventricular contractions)   . Rotator cuff tear   . Visit for monitoring Tikosyn therapy 12/19/2017    Patient Active Problem List   Diagnosis Date Noted  . Iron deficiency anemia 11/25/2019  . Difficulty sleeping 11/21/2019  . Hypomagnesemia 05/15/2019  . Alcoholism ([place]) 05/14/2019  . Cough 10/31/2018  . Hyponatremia 10/30/2018  . Alcohol withdrawal seizure ([place]) 06/26/2018   . History of alcohol abuse 06/25/2018  . Leukocytosis 06/25/2018  . Persistent atrial fibrillation ([place]) 12/19/2017  . HFrEF (heart failure with reduced ejection fraction) ([place]) 12/04/2017  . Memory difficulties 10/26/2017  . 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 Sensorineural hearing loss (SNHL), 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  . Prediabetes 04/07/2016  . Full thickness rotator cuff tear 04/23/2014  . Anticoagulation adequate, new on eliquis 03/07/2014  . SKIN CANCER, HX OF 03/24/2010  . DIVERTICULOSIS, COLON 12/11/2008  . SPINAL STENOSIS 01/14/2008  . BACK PAIN, LUMBAR, WITH RADICULOPATHY 01/02/2008  . Essential hypertension 10/31/2007  . 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 GERD 09/27/2007    Past Surgical History:  Procedure Laterality Date  . CARDIOVERSION N/A 12/05/2017   Procedure: CARDIOVERSION;  Surgeon: Dorothy Spark, MD;  Location: [facility] ENDOSCOPY;  Service: Cardiovascular;  Laterality: N/A;  . COLONOSCOPY  2006   Diverticulosis  . CYSTECTOMY     lip  . CYSTECTOMY     anterior thorax-chest  . INGUINAL HERNIA REPAIR      x 2  . 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 SHOULDER OPEN ROTATOR CUFF REPAIR Right 04/23/2014   Procedure: RIGHT SHOULDER ROTATOR CUFF REPAIR WITH GRAFT AND ANCHORS . OPEN ACROMIONECTOMY;  Surgeon: Tobi Bastos, MD;  Location: [facility]  ORS;  Service: Orthopedics;  Laterality: Right;  . SPINE SURGERY  2012   Dr Gladstone Lighter  . TEE WITHOUT CARDIOVERSION N/A 12/05/2017   Procedure: TRANSESOPHAGEAL ECHOCARDIOGRAM (TEE);  Surgeon: Dorothy Spark, MD;  Location: [facility] ENDOSCOPY;  Service: Cardiovascular;  Laterality: N/A;  . TONSILLECTOMY AND ADENOIDECTOMY         Family History  Problem Relation Age of Onset  . Heart attack Father 33  . Diabetes Mother   . Transient ischemic attack  Mother 63  . Diabetes Sister   . Heart attack Sister 31  . Stomach cancer Maternal Grandfather   . Cirrhosis Paternal Uncle        alcoholic  . Alzheimer's disease Brother   . Colon cancer Neg Hx   . Colon polyps Neg Hx     Social History   Tobacco Use  . Smoking status: Former Smoker    Packs/day: 1.00    Types: Cigarettes    Quit date: 1990    Years since quitting: 31.4  . Smokeless tobacco: Former Systems developer    Types: Chew    Quit date: 34  . Tobacco comment: smoked [place] with 12 year abstinence ( 21 total years of smoking), up to 1 ppd  Substance Use Topics  . Alcohol use: Not Currently    Comment: 09/26/18 NONE; previous 4 shots of alcohol a few days a week  . Drug use: Never    Home Medications Prior to Admission medications   Medication Sig Start Date End Date Taking? Authorizing Provider  amLODipine (NORVASC) 2.5 MG tablet Take 1 tablet (2.5 mg total) by mouth daily. 10/14/19  Yes Sherran Needs, NP  apixaban (ELIQUIS) 5 MG TABS tablet Take 1 tablet (5 mg total) by mouth 2 (two) times daily. 10/10/19  Yes Sherran Needs, NP  chlordiazePOXIDE (LIBRIUM) 25 MG capsule Take 25-50 mg by mouth See admin instructions. Taking 2 caps (50mg ) every 12hrs, for 2 days, then 1 cap (25mg ) twice daily for 2 days, then 1 cap (25mg ) every night. 04/16/20  Yes [provider]  Cholecalciferol (VITAMIN D-3) 25 MCG (1000 UT) CAPS Take 1 capsule (1,000 Units total) by mouth daily. 05/15/19  Yes Burns, Claudina Lick, MD  dofetilide (TIKOSYN) 500 MCG capsule Take 1 capsule (500 mcg total) by mouth 2 (two) times daily. 10/10/19  Yes Sherran Needs, NP  folic acid (FOLVITE) 1 MG tablet TAKE 1 TABLET EVERY DAY Patient taking differently: Take 1 mg by mouth daily.  02/28/20  Yes Burns, Claudina Lick, MD  furosemide (LASIX) 20 MG tablet TAKE 1 TABLET EVERY DAY Patient taking differently: Take 20 mg by mouth daily.  09/25/19  Yes Sherran Needs, NP  GNP VITAMIN B-1 100 MG tablet TAKE 1 TABLET  EVERY DAY Patient taking differently: Take 100 mg by mouth daily.  02/28/20  Yes Burns, Claudina Lick, MD  levETIRAcetam (KEPPRA) 500 MG tablet Take 1 tablet (500 mg total) by mouth 2 (two) times daily. 09/30/19  Yes Lomax, Amy, NP  magnesium oxide (MAG-OX) 400 MG tablet Take 1 tablet (400 mg total) by mouth daily. 05/15/19  Yes Burns, Claudina Lick, MD  metoprolol tartrate (LOPRESSOR) 100 MG tablet Take 1 tablet (100 mg total) by mouth 2 (two) times daily. 10/10/19  Yes Sherran Needs, NP  Multiple Vitamins-Minerals (ICAPS AREDS 2 PO) Take 1 capsule by mouth 2 (two) times daily.   Yes [provider]  quinapril (ACCUPRIL) 40 MG tablet Take 1 tablet (40 mg total)  by mouth every morning. 10/10/19  Yes Sherran Needs, NP  sodium chloride (OCEAN) 0.65 % SOLN nasal spray Place 1 spray into both nostrils as needed for congestion.   Yes [provider]  vitamin B-12 (CYANOCOBALAMIN) 1000 MCG tablet TAKE 1 TABLET EVERY DAY Patient taking differently: Take 1,000 mcg by mouth daily.  02/28/20  Yes Burns, Claudina Lick, MD  cholecalciferol (VITAMIN D) 25 MCG (1000 UNIT) tablet TAKE 1 TABLET EVERY DAY Patient not taking: Reported on 04/18/2020 02/28/20   Binnie Rail, MD  magnesium oxide (MAG-OX) 400 (241.3 Mg) MG tablet TAKE 1 TABLET EVERY DAY Patient not taking: Reported on 04/18/2020 02/28/20   Binnie Rail, MD  traZODone (DESYREL) 100 MG tablet Take 1 tablet (100 mg total) by mouth at bedtime. Patient not taking: Reported on 04/18/2020 11/25/19   Binnie Rail, MD    Allergies    Glucosamine and Shellfish-derived products  Review of Systems   Review of Systems  Constitutional: Negative for chills and fever.  HENT: Negative for sore throat.   Eyes: Negative for visual disturbance.  Respiratory: Negative for cough and shortness of breath.   Cardiovascular: Negative for chest pain, palpitations and leg swelling.  Gastrointestinal: Negative for abdominal pain, diarrhea and vomiting.  Genitourinary:  Negative for dysuria and flank pain.  Musculoskeletal: Negative for back pain and neck pain.  Skin: Negative for rash.  Neurological: Negative for numbness and headaches.  Hematological: Does not bruise/bleed easily.  Psychiatric/Behavioral: Positive for confusion.    Physical Exam Updated Vital Signs BP 99/62 (BP Location: Right Arm)   Pulse 78   Temp 98.6 F (37 C) (Oral)   Resp 20   Ht 1.854 m (6\' 1" )   Wt 88 kg   SpO2 95%   BMI 25.60 kg/m   Physical Exam Vitals and nursing note reviewed.  Constitutional:      Appearance: Normal appearance. He is well-developed.  HENT:     Head: Atraumatic.     Nose: Nose normal.     Mouth/Throat:     Mouth: Mucous membranes are moist.     Pharynx: Oropharynx is clear.  Eyes:     General: No scleral icterus.    Conjunctiva/sclera: Conjunctivae normal.     Pupils: Pupils are equal, round, and reactive to light.  Neck:     Vascular: No carotid bruit.     Trachea: No tracheal deviation.  Cardiovascular:     Rate and Rhythm: Normal rate and regular rhythm.     Pulses: Normal pulses.     Heart sounds: Normal heart sounds. No murmur. No friction rub. No gallop.   Pulmonary:     Effort: Pulmonary effort is normal. No accessory muscle usage or respiratory distress.     Breath sounds: Normal breath sounds.  Chest:     Chest wall: No tenderness.  Abdominal:     General: Bowel sounds are normal. There is no distension.     Palpations: Abdomen is soft.     Tenderness: There is no abdominal tenderness. There is no guarding.  Genitourinary:    Comments: No cva tenderness. Musculoskeletal:        General: No swelling or tenderness.     Cervical back: Normal range of motion and neck supple. No rigidity or tenderness.     Comments: CTLS spine, non tender, aligned, no step off. Good rom bil ext without pain or focal bony tenderness.   Skin:    General: Skin is warm and  dry.     Findings: No rash.  Neurological:     Mental Status: He is  alert.     Comments: Alert, speech slightly slurred/dysarthric. Oriented. Motor intact bil, stre 5/5. sens grossly intact. Gait testing deferred.   Psychiatric:     Comments: Mildly drowsy appearing, slow to respond.      ED Results / Procedures / Treatments   Labs (all labs ordered are listed, but only abnormal results are displayed) Results for orders placed or performed during the hospital encounter of 04/18/20  CBC  Result Value Ref Range   WBC 10.8 (H) 4.0 - 10.5 K/uL   RBC 4.84 4.22 - 5.81 MIL/uL   Hemoglobin 14.0 13.0 - 17.0 g/dL   HCT 42.1 39.0 - 52.0 %   MCV 87.0 80.0 - 100.0 fL   MCH 28.9 26.0 - 34.0 pg   MCHC 33.3 30.0 - 36.0 g/dL   RDW 14.5 11.5 - 15.5 %   Platelets 140 (L) 150 - 400 K/uL   nRBC 0.0 0.0 - 0.2 %  Comprehensive metabolic panel  Result Value Ref Range   Sodium 135 135 - 145 mmol/L   Potassium 4.1 3.5 - 5.1 mmol/L   Chloride 98 98 - 111 mmol/L   CO2 23 22 - 32 mmol/L   Glucose, Bld 145 (H) 70 - 99 mg/dL   BUN 22 8 - 23 mg/dL   Creatinine, Ser 1.13 0.61 - 1.24 mg/dL   Calcium 9.0 8.9 - 10.3 mg/dL   Total Protein 6.7 6.5 - 8.1 g/dL   Albumin 3.6 3.5 - 5.0 g/dL   AST 26 15 - 41 U/L   ALT 17 0 - 44 U/L   Alkaline Phosphatase 61 38 - 126 U/L   Total Bilirubin 1.6 (H) 0.3 - 1.2 mg/dL   GFR calc non Af Amer >60 >60 mL/min   GFR calc Af Amer >60 >60 mL/min   Anion gap 14 5 - 15  Protime-INR  Result Value Ref Range   Prothrombin Time 15.3 (H) 11.4 - 15.2 seconds   INR 1.3 (H) 0.8 - 1.2  Urinalysis, Routine w reflex microscopic  Result Value Ref Range   Color, Urine YELLOW YELLOW   APPearance HAZY (A) CLEAR   Specific Gravity, Urine 1.014 1.005 - 1.030   pH 6.0 5.0 - 8.0   Glucose, UA NEGATIVE NEGATIVE mg/dL   Hgb urine dipstick MODERATE (A) NEGATIVE   Bilirubin Urine NEGATIVE NEGATIVE   Ketones, ur 20 (A) NEGATIVE mg/dL   Protein, ur NEGATIVE NEGATIVE mg/dL   Nitrite POSITIVE (A) NEGATIVE   Leukocytes,Ua LARGE (A) NEGATIVE   RBC / HPF 21-50 0  - 5 RBC/hpf   WBC, UA >50 (H) 0 - 5 WBC/hpf   Bacteria, UA RARE (A) NONE SEEN   Squamous Epithelial / LPF 0-5 0 - 5  Rapid urine drug screen (hospital performed)  Result Value Ref Range   Opiates NONE DETECTED NONE DETECTED   Cocaine NONE DETECTED NONE DETECTED   Benzodiazepines POSITIVE (A) NONE DETECTED   Amphetamines NONE DETECTED NONE DETECTED   Tetrahydrocannabinol NONE DETECTED NONE DETECTED   Barbiturates NONE DETECTED NONE DETECTED  Ethanol  Result Value Ref Range   Alcohol, Ethyl (B) Q000111Q Q000111Q mg/dL  Salicylate level  Result Value Ref Range   Salicylate Lvl Q000111Q (L) 7.0 - 30.0 mg/dL  Acetaminophen level  Result Value Ref Range   Acetaminophen (Tylenol), Serum <10 (L) 10 - 30 ug/mL  CBG monitoring, ED  Result Value  Ref Range   Glucose-Capillary 143 (H) 70 - 99 mg/dL   Comment 1 Notify RN    Comment 2 Document in Chart     EKG EKG Interpretation  Date/Time:  Saturday Apr 18 2020 10:21:38 EDT Ventricular Rate:  79 PR Interval:    QRS Duration: 123 QT Interval:  434 QTC Calculation: 498 R Axis:   5 Text Interpretation: Sinus rhythm Right bundle branch block Baseline wander Confirmed by Lajean Saver [phone]) on 04/18/2020 10:31:46 AM   Radiology CT HEAD WO CONTRAST  Result Date: 04/18/2020 CLINICAL DATA:  Altered mental status. EXAM: CT HEAD WITHOUT CONTRAST TECHNIQUE: Contiguous axial images were obtained from the base of the skull through the vertex without intravenous contrast. COMPARISON:  None. FINDINGS: Brain: No acute intracranial hemorrhage. No focal mass lesion. No CT evidence of acute infarction. No midline shift or mass effect. No hydrocephalus. Basilar cisterns are patent. There are periventricular and subcortical white matter hypodensities. Generalized cortical atrophy. Vascular: No hyperdense vessel or unexpected calcification. Skull: Normal. Negative for fracture or focal lesion. Sinuses/Orbits: Paranasal sinuses and mastoid air cells are clear. Orbits are  clear. Other: None. IMPRESSION: 1. No acute intracranial findings. 2. Mild atrophy and moderate periventricular white matter small vessel ischemia Electronically Signed   By: Suzy Bouchard M.D.   On: 04/18/2020 11:20   MR BRAIN WO CONTRAST  Result Date: 04/18/2020 CLINICAL DATA:  Altered mental status EXAM: MRI HEAD WITHOUT CONTRAST TECHNIQUE: Multiplanar, multiecho pulse sequences of the brain and surrounding structures were obtained without intravenous contrast. COMPARISON:  2019 FINDINGS: Brain: There is no acute infarction or intracranial hemorrhage. There is no intracranial mass, mass effect, or edema. There is no hydrocephalus or extra-axial fluid collection. Patchy and confluent areas of T2 hyperintensity in the supratentorial white matter are nonspecific but may reflect moderate chronic microvascular ischemic changes. Prominence of the ventricles and sulci reflects minor generalized parenchymal volume loss Vascular: Major vessel flow voids at the skull base are preserved. Skull and upper cervical spine: Normal marrow signal is preserved. Sinuses/Orbits: Paranasal sinuses are aerated. Bilateral lens replacements. Other: Sella is unremarkable.  Mastoid air cells are clear. IMPRESSION: No evidence of recent infarction, hemorrhage, or mass. Moderate chronic microvascular ischemic changes. Electronically Signed   By: Macy Mis M.D.   On: 04/18/2020 14:39    Procedures Procedures (including critical care time)  Medications Ordered in ED Medications  cefTRIAXone (ROCEPHIN) 1 g in sodium chloride 0.9 % 100 mL IVPB (1 g Intravenous New Bag/Given 04/18/20 1248)  sodium chloride 0.9 % bolus 1,000 mL (has no administration in time range)    ED Course  I have reviewed the triage vital signs and the nursing notes.  Pertinent labs & imaging results that were available during my care of the patient were reviewed by me and considered in my medical decision making (see chart for details).    MDM  Rules/Calculators/A&P                     Iv ns. Continuous pulse ox and monitor. Stat labs. Ct.   Reviewed nursing notes and prior charts for additional history.   Recent poor po intake. Ns bolus.   Initial labs reviewed/interpreted by me - chem normal.  CT reviewed/interpreted by me - no hem.   Additional labs reviewed/interpreted by me - ua c/w uti. Urine culture sent. Iv antibiotics given/rocephin.   Given change in speech/balance, mri ordered r/o cva.   MRI reviewed/interpreted by me - no acute  cva.   Suspect general weakness, malaise, altered ms/confusion multifactorial, related to acute uti, and hx chronic etoh abuse with recent cessation and bzd medication for withdrawal symptoms - will consulted medicine for admission.  Banana bag iv w thiamine.   Medicine service consulted for admission re weakness, uti.     Final Clinical Impression(s) / ED Diagnoses Final diagnoses:  None    Rx / DC Orders ED Discharge Orders    None       Lajean Saver, MD 04/18/20 1521

## 2020-04-19 DIAGNOSIS — B957 Other staphylococcus as the cause of diseases classified elsewhere: Secondary | ICD-10-CM | POA: Diagnosis present

## 2020-04-19 DIAGNOSIS — Z8249 Family history of ischemic heart disease and other diseases of the circulatory system: Secondary | ICD-10-CM | POA: Diagnosis not present

## 2020-04-19 DIAGNOSIS — Z7901 Long term (current) use of anticoagulants: Secondary | ICD-10-CM | POA: Diagnosis not present

## 2020-04-19 DIAGNOSIS — I5020 Unspecified systolic (congestive) heart failure: Secondary | ICD-10-CM | POA: Diagnosis present

## 2020-04-19 DIAGNOSIS — G9341 Metabolic encephalopathy: Secondary | ICD-10-CM | POA: Diagnosis present

## 2020-04-19 DIAGNOSIS — R197 Diarrhea, unspecified: Secondary | ICD-10-CM | POA: Diagnosis present

## 2020-04-19 DIAGNOSIS — Z20822 Contact with and (suspected) exposure to covid-19: Secondary | ICD-10-CM | POA: Diagnosis present

## 2020-04-19 DIAGNOSIS — R5381 Other malaise: Secondary | ICD-10-CM | POA: Diagnosis not present

## 2020-04-19 DIAGNOSIS — E876 Hypokalemia: Secondary | ICD-10-CM | POA: Diagnosis not present

## 2020-04-19 DIAGNOSIS — F10239 Alcohol dependence with withdrawal, unspecified: Secondary | ICD-10-CM | POA: Diagnosis present

## 2020-04-19 DIAGNOSIS — E860 Dehydration: Secondary | ICD-10-CM | POA: Diagnosis present

## 2020-04-19 DIAGNOSIS — N390 Urinary tract infection, site not specified: Secondary | ICD-10-CM | POA: Diagnosis present

## 2020-04-19 DIAGNOSIS — D509 Iron deficiency anemia, unspecified: Secondary | ICD-10-CM | POA: Diagnosis present

## 2020-04-19 DIAGNOSIS — H9190 Unspecified hearing loss, unspecified ear: Secondary | ICD-10-CM | POA: Diagnosis present

## 2020-04-19 DIAGNOSIS — K219 Gastro-esophageal reflux disease without esophagitis: Secondary | ICD-10-CM | POA: Diagnosis present

## 2020-04-19 DIAGNOSIS — Z7401 Bed confinement status: Secondary | ICD-10-CM | POA: Diagnosis not present

## 2020-04-19 DIAGNOSIS — Y909 Presence of alcohol in blood, level not specified: Secondary | ICD-10-CM | POA: Diagnosis present

## 2020-04-19 DIAGNOSIS — Z66 Do not resuscitate: Secondary | ICD-10-CM | POA: Diagnosis present

## 2020-04-19 DIAGNOSIS — R471 Dysarthria and anarthria: Secondary | ICD-10-CM | POA: Diagnosis present

## 2020-04-19 DIAGNOSIS — I4819 Other persistent atrial fibrillation: Secondary | ICD-10-CM | POA: Diagnosis present

## 2020-04-19 DIAGNOSIS — I110 Hypertensive heart disease with heart failure: Secondary | ICD-10-CM | POA: Diagnosis present

## 2020-04-19 DIAGNOSIS — I4510 Unspecified right bundle-branch block: Secondary | ICD-10-CM | POA: Diagnosis present

## 2020-04-19 DIAGNOSIS — E785 Hyperlipidemia, unspecified: Secondary | ICD-10-CM | POA: Diagnosis present

## 2020-04-19 DIAGNOSIS — R531 Weakness: Secondary | ICD-10-CM | POA: Diagnosis present

## 2020-04-19 DIAGNOSIS — F1020 Alcohol dependence, uncomplicated: Secondary | ICD-10-CM | POA: Diagnosis not present

## 2020-04-19 DIAGNOSIS — M2550 Pain in unspecified joint: Secondary | ICD-10-CM | POA: Diagnosis not present

## 2020-04-19 DIAGNOSIS — Z85828 Personal history of other malignant neoplasm of skin: Secondary | ICD-10-CM | POA: Diagnosis not present

## 2020-04-19 DIAGNOSIS — Z888 Allergy status to other drugs, medicaments and biological substances status: Secondary | ICD-10-CM | POA: Diagnosis not present

## 2020-04-19 LAB — COMPREHENSIVE METABOLIC PANEL
ALT: 15 U/L (ref 0–44)
AST: 21 U/L (ref 15–41)
Albumin: 3 g/dL — ABNORMAL LOW (ref 3.5–5.0)
Alkaline Phosphatase: 59 U/L (ref 38–126)
Anion gap: 7 (ref 5–15)
BUN: 11 mg/dL (ref 8–23)
CO2: 25 mmol/L (ref 22–32)
Calcium: 8.3 mg/dL — ABNORMAL LOW (ref 8.9–10.3)
Chloride: 104 mmol/L (ref 98–111)
Creatinine, Ser: 0.87 mg/dL (ref 0.61–1.24)
GFR calc Af Amer: >60 mL/min (ref >60)
GFR calc non Af Amer: >60 mL/min (ref >60)
Glucose, Bld: 128 mg/dL — ABNORMAL HIGH (ref 70–99)
Potassium: 3.1 mmol/L — ABNORMAL LOW (ref 3.5–5.1)
Sodium: 136 mmol/L (ref 135–145)
Total Bilirubin: 0.8 mg/dL (ref 0.3–1.2)
Total Protein: 6.1 g/dL — ABNORMAL LOW (ref 6.5–8.1)

## 2020-04-19 LAB — CBC
HCT: 38.7 % — ABNORMAL LOW (ref 39.0–52.0)
Hemoglobin: 12.7 g/dL — ABNORMAL LOW (ref 13.0–17.0)
MCH: 29.2 pg (ref 26.0–34.0)
MCHC: 32.8 g/dL (ref 30.0–36.0)
MCV: 89 fL (ref 80.0–100.0)
Platelets: 121 10*3/uL — ABNORMAL LOW (ref 150–400)
RBC: 4.35 MIL/uL (ref 4.22–5.81)
RDW: 14.5 % (ref 11.5–15.5)
WBC: 8.6 10*3/uL (ref 4.0–10.5)
nRBC: 0 % (ref 0.0–0.2)

## 2020-04-19 MED ORDER — POTASSIUM CHLORIDE CRYS ER 20 MEQ PO TBCR
40.0000 meq | EXTENDED_RELEASE_TABLET | ORAL | Status: AC
  Administered 2020-04-19 (×2): 40 meq via ORAL
  Filled 2020-04-19 (×2): qty 2

## 2020-04-19 MED ORDER — POTASSIUM PHOSPHATES 15 MMOLE/5ML IV SOLN
30.0000 mmol | Freq: Once | INTRAVENOUS | Status: AC
  Administered 2020-04-19: 30 mmol via INTRAVENOUS
  Filled 2020-04-19: qty 10

## 2020-04-19 NOTE — Progress Notes (Addendum)
PROGRESS NOTE    ROE OKERSON  [id number] DOB: 17-Aug-1946 DOA: 04/18/2020 PCP: Binnie Rail, MD    Chief Complaint  Patient presents with  . Weakness    Brief Narrative:  Patient 74 year old gentleman history of A. fib on chronic anticoagulation with Eliquis, history of alcohol abuse, GERD, hyperlipidemia presented to the ED with worsening confusion x2 days, decreased oral intake, weakness.  Patient admitted and being treated for alcohol withdrawal and UTI.   Assessment & Plan:   Principal Problem:   Acute metabolic encephalopathy Active Problems:   Acute lower UTI   Essential hypertension   GERD   Memory difficulties   HFrEF (heart failure with reduced ejection fraction) (HCC)   Persistent atrial fibrillation (HCC)   History of alcohol abuse   Iron deficiency anemia   Alcohol withdrawal ([place])   Weakness generalized   Dehydration  1 acute metabolic encephalopathy Multifactorial secondary to acute UTI and alcohol withdrawal syndrome.  Patient per wife noted to have recently been started on a Librium detox protocol 2 days prior to admission.  Patient's last drink was 2 days prior to admission when he had gone on a 1 week alcohol binge.  Urinalysis done on admission concerning for UTI.  Urine culture is pending.  CT head negative.  MRI negative.  Patient afebrile.  Chest x-ray negative for any acute abnormalities.  Ammonia levels within normal limits.  Patient improving clinically.  Continue IV fluids, IV Rocephin, Librium detox protocol, Ativan withdrawal protocol.  Supportive care.  2.  UTI Urine cultures pending.  IV Rocephin.  3.  Gastroesophageal reflux disease PPI.  4.  Alcohol withdrawal/alcohol abuse Patient presented with confusion and generalized weakness.  Patient per wife had gone on a 1 week alcohol binge.  Last alcohol use was 2 days prior to admission.  Per wife patient with prior history of alcoholism and patient noted to have been sober for  approximately 35 years and subsequently resumed drinking 2 years ago intermittently.  Per wife patient had been started on Librium taper per PCP for alcohol detox.  Patient currently on the Librium detox protocol as well as the Ativan withdrawal protocol.  Continue thiamine, folic acid, multivitamin.  Supportive care.  Per wife patient has signed up for an intensive outpatient alcohol detox program which patient will go to post discharge.  5.  Dehydration IV fluids.  6.  Generalized weakness Likely secondary to UTI and alcohol withdrawal.  PT/OT.  7.  Persistent atrial fibrillation Currently in normal sinus rhythm.  Continue home regimen Tikosyn and metoprolol for rate control.  Eliquis for anticoagulation.  8.  Leukocytosis Likely secondary to UTI versus reactive.  Urine cultures pending.  Chest x-ray negative for any acute infiltrate.  IV Rocephin.  9.  Hypokalemia/Hypophosphatemia K. Dur 40 mEq p.o. every 4 hours x2, K-Phos 30 mmol IV x1.    DVT prophylaxis: Eliquis Code Status: DNR Family Communication: Updated patient.  No family at bedside. Disposition:   Status is: Observation    Dispo: The patient is from: Home              Anticipated d/c is to: To be determined.  Likely home with home health versus SNF              Anticipated d/c date is: 04/23/2019 2106 02/08/2020              Patient currently on Librium and Ativan withdrawal protocol, IV antibiotics, urine cultures pending.  Consultants:   None  Procedures:   MRI brain 04/18/2020  CT head 04/18/2020  Chest x-ray 04/18/2020  Antimicrobials:   IV Rocephin 04/18/2020   Subjective: Patient hard of hearing.  Patient sitting up in bed tolerating clear liquids.  Less confused today however not at baseline.  Denies any chest pain.  No shortness of breath.  Objective: Vitals:   04/18/20 1915 04/18/20 2149 04/19/20 0203 04/19/20 0556  BP: 112/63 140/80 127/68 (!) 149/77  Pulse: 95 85 74 82  Resp:  18 17  16   Temp:  98 F (36.7 C) 98.4 F (36.9 C) 98.2 F (36.8 C)  TempSrc:      SpO2: 91% 96% 95% 97%  Weight:  86.2 kg    Height:  6\' 1"  (1.854 m)      Intake/Output Summary (Last 24 hours) at 04/19/2020 0908 Last data filed at 04/19/2020 0556 Gross per 24 hour  Intake 516.27 ml  Output 0 ml  Net 516.27 ml   Filed Weights   04/18/20 1036 04/18/20 2149  Weight: 88 kg 86.2 kg    Examination:  General exam: Appears calm and comfortable  Respiratory system: Clear to auscultation. Respiratory effort normal. Cardiovascular system: S1 & S2 heard, RRR. No JVD, murmurs, rubs, gallops or clicks. No pedal edema. Gastrointestinal system: Abdomen is nondistended, soft and nontender. No organomegaly or masses felt. Normal bowel sounds heard. Central nervous system: Alert and oriented. No focal neurological deficits. Extremities: Symmetric 5 x 5 power. Skin: No rashes, lesions or ulcers Psychiatry: Judgement and insight appear normal. Mood & affect appropriate.     Data Reviewed: I have personally reviewed following labs and imaging studies  CBC: Recent Labs  Lab 04/15/20 1101 04/18/20 1024 04/19/20 0644  WBC 12.1* 10.8* 8.6  HGB 15.1 14.0 12.7*  HCT 46.1 42.1 38.7*  MCV 89.3 87.0 89.0  PLT 224 140* 121*    Basic Metabolic Panel: Recent Labs  Lab 04/15/20 1101 04/18/20 1024 04/18/20 2158 04/19/20 0644  NA 125* 135  --  136  K 5.0 4.1  --  3.1*  CL 91* 98  --  104  CO2 17* 23  --  25  GLUCOSE 86 145*  --  128*  BUN 14 22  --  11  CREATININE 0.91 1.13  --  0.87  CALCIUM 8.0* 9.0  --  8.3*  MG  --   --  1.8  --   PHOS  --   --  2.0*  --     GFR: Estimated Creatinine Clearance: 84.2 mL/min (by C-G formula based on SCr of 0.87 mg/dL).  Liver Function Tests: Recent Labs  Lab 04/15/20 1101 04/18/20 1024 04/19/20 0644  AST 55* 26 21  ALT 26 17 15   ALKPHOS 73 61 59  BILITOT 1.5* 1.6* 0.8  PROT 6.5 6.7 6.1*  ALBUMIN 3.6 3.6 3.0*    CBG: Recent Labs  Lab  04/18/20 1020  GLUCAP 143*     Recent Results (from the past 240 hour(s))  SARS Coronavirus 2 by RT PCR (hospital order, performed in [facility] hospital lab) Nasopharyngeal Nasopharyngeal Swab     Status: None   Collection Time: 04/18/20  4:27 PM   Specimen: Nasopharyngeal Swab  Result Value Ref Range Status   SARS Coronavirus 2 NEGATIVE NEGATIVE Final    Comment: (NOTE) SARS-CoV-2 target nucleic acids are NOT DETECTED. The SARS-CoV-2 RNA is generally detectable in upper and lower respiratory specimens during the acute phase of infection. The lowest concentration  of SARS-CoV-2 viral copies this assay can detect is 250 copies / mL. A negative result does not preclude SARS-CoV-2 infection and should not be used as the sole basis for treatment or other patient management decisions.  A negative result may occur with improper specimen collection / handling, submission of specimen other than nasopharyngeal swab, presence of viral mutation(s) within the areas targeted by this assay, and inadequate number of viral copies (<250 copies / mL). A negative result must be combined with clinical observations, patient history, and epidemiological information. Fact Sheet for Patients:   StrictlyIdeas.no Fact Sheet for Healthcare Providers: BankingDealers.co.za This test is not yet approved or cleared  by the [place] FDA and has been authorized for detection and/or diagnosis of SARS-CoV-2 by FDA under an Emergency Use Authorization (EUA).  This EUA will remain in effect (meaning this test can be used) for the duration of the COVID-19 declaration under Section 564(b)(1) of the Act, 21 U.S.C. section 360bbb-3(b)(1), unless the authorization is terminated or revoked sooner. Performed at [facility], [place] [address] [place], [place] [address]          Radiology Studies: CT HEAD WO CONTRAST  Result Date: 04/18/2020 CLINICAL DATA:   Altered mental status. EXAM: CT HEAD WITHOUT CONTRAST TECHNIQUE: Contiguous axial images were obtained from the base of the skull through the vertex without intravenous contrast. COMPARISON:  None. FINDINGS: Brain: No acute intracranial hemorrhage. No focal mass lesion. No CT evidence of acute infarction. No midline shift or mass effect. No hydrocephalus. Basilar cisterns are patent. There are periventricular and subcortical white matter hypodensities. Generalized cortical atrophy. Vascular: No hyperdense vessel or unexpected calcification. Skull: Normal. Negative for fracture or focal lesion. Sinuses/Orbits: Paranasal sinuses and mastoid air cells are clear. Orbits are clear. Other: None. IMPRESSION: 1. No acute intracranial findings. 2. Mild atrophy and moderate periventricular white matter small vessel ischemia Electronically Signed   By: Suzy Bouchard M.D.   On: 04/18/2020 11:20   MR BRAIN WO CONTRAST  Result Date: 04/18/2020 CLINICAL DATA:  Altered mental status EXAM: MRI HEAD WITHOUT CONTRAST TECHNIQUE: Multiplanar, multiecho pulse sequences of the brain and surrounding structures were obtained without intravenous contrast. COMPARISON:  2019 FINDINGS: Brain: There is no acute infarction or intracranial hemorrhage. There is no intracranial mass, mass effect, or edema. There is no hydrocephalus or extra-axial fluid collection. Patchy and confluent areas of T2 hyperintensity in the supratentorial white matter are nonspecific but may reflect moderate chronic microvascular ischemic changes. Prominence of the ventricles and sulci reflects minor generalized parenchymal volume loss Vascular: Major vessel flow voids at the skull base are preserved. Skull and upper cervical spine: Normal marrow signal is preserved. Sinuses/Orbits: Paranasal sinuses are aerated. Bilateral lens replacements. Other: Sella is unremarkable.  Mastoid air cells are clear. IMPRESSION: No evidence of recent infarction, hemorrhage, or  mass. Moderate chronic microvascular ischemic changes. Electronically Signed   By: Macy Mis M.D.   On: 04/18/2020 14:39   Portable chest 1 View  Result Date: 04/18/2020 CLINICAL DATA:  Unresponsive EXAM: PORTABLE CHEST 1 VIEW COMPARISON:  June 24, 2018 FINDINGS: There is slight atelectatic change in the left base. The lungs elsewhere are clear. The heart is upper normal in size with pulmonary vascularity normal. There is aortic atherosclerosis. No pneumothorax. There is degenerative change in the thoracic spine. IMPRESSION: Slight left base atelectasis. Lungs elsewhere clear. Heart upper normal in size. Electronically Signed   By: Lowella Grip III M.D.   On: 04/18/2020 17:38  Scheduled Meds: . apixaban  5 mg Oral BID  . chlordiazePOXIDE  25 mg Oral QID   Followed by  . [START ON 04/20/2020] chlordiazePOXIDE  25 mg Oral TID   Followed by  . [START ON 04/21/2020] chlordiazePOXIDE  25 mg Oral BH-qamhs   Followed by  . [START ON 04/22/2020] chlordiazePOXIDE  25 mg Oral Daily  . cholecalciferol  1,000 Units Oral Daily  . dofetilide  500 mcg Oral BID  . levETIRAcetam  500 mg Oral BID  . magnesium oxide  400 mg Oral Daily  . metoprolol tartrate  100 mg Oral BID  . multivitamin with minerals  1 tablet Oral Daily  . pantoprazole  40 mg Oral Q0600  . potassium chloride  40 mEq Oral Q4H  . sodium chloride flush  3 mL Intravenous Q12H  . thiamine  100 mg Oral Daily  . vitamin B-12  1,000 mcg Oral Daily   Continuous Infusions: . cefTRIAXone (ROCEPHIN)  IV    . dextrose 5 % and 0.45% NaCl 75 mL/hr at 04/18/20 2302  . potassium PHOSPHATE IVPB (in mmol)       LOS: 0 days    Time spent: 35 minutes    Irine Seal, MD [facility]   To contact the attending provider between 7A-7P or the covering provider during after hours 7P-7A, please log into the web site [email or web address] and access using universal [place] password for that web site. If you do not have the  password, please call the hospital operator.  04/19/2020, 9:08 AM

## 2020-04-19 NOTE — Evaluation (Signed)
Occupational Therapy Evaluation Patient Details Name: Sergio Mack MRN: [id number] DOB: [birth year] Today's Date: 04/19/2020    History of Present Illness Sergio Mack is a 74 y.o. male with medical history significant of atrial fibrillation on chronic anticoagulation with Eliquis, history of alcohol abuse, gastroesophageal reflux disease, hyperlipidemia, who presents to the ED with worsening confusion for the past 1 to 2 days, decreased oral intake, generalized weakness, thick/slurred speech.  fell off his bed on the day of admission, with generalized weakness and patient also noted to have some dysarthric/slurred speech; According to wife, pt was trying to alcohol detx with supervision of PCP, starting a few days prior to admission   Clinical Impression   PTA, pt was living at home with his wife, pt reports he was independent with ADL/IADL and modified independent with functional mobility at RW level. Pt reports he was driving until he started drinking again. Pt reports he was sober for 30 years. Pt currently requires minA for functional mobility at RW level, he demonstrates decreased activity tolerance, tolerating standing at sink level for about 5 minutes. Pt presents with very small steps during mobility and fasciculations after standing for 5 min impacting his safety with functional mobility. Due to decline in current level of function, pt would benefit from acute OT to address established goals to facilitate safe D/C to venue listed below. At this time, recommend SNF follow-up. Pt expressed his preference to d/c home. Depending on pt's acute progress he may be appropriate to d/c home with [facility] and physical assistance from family. Will continue to follow acutely.     Follow Up Recommendations  SNF;Supervision/Assistance - 24 hour    Equipment Recommendations  3 in 1 bedside commode    Recommendations for Other Services       Precautions / Restrictions Precautions Precautions:  Fall Restrictions Weight Bearing Restrictions: No      Mobility Bed Mobility Overal bed mobility: Needs Assistance Bed Mobility: Supine to Sit     Supine to sit: Min guard;HOB elevated     General bed mobility comments: increased time and effort  Transfers Overall transfer level: Needs assistance Equipment used: Rolling walker (2 wheeled) Transfers: Sit to/from Stand Sit to Stand: Min assist         General transfer comment: minA for safety, vc for safe hand placement, minA for powerup     Balance Overall balance assessment: Needs assistance Sitting-balance support: No upper extremity supported;Feet supported Sitting balance-Sergio Mack Scale: Fair     Standing balance support: Single extremity supported;During functional activity Standing balance-Sergio Mack Scale: Fair Standing balance comment: required at least single UE support for stability;able to static stand without UE support for short duration;decreased activity tolerance, after standing at sink level for about 5 min noticed slight fasciculations                            ADL either performed or assessed with clinical judgement   ADL Overall ADL's : Needs assistance/impaired Eating/Feeding: Set up;Sitting   Grooming: Minimal assistance;Standing Grooming Details (indicate cue type and reason): completed grooming at sink level minA for safety Upper Body Bathing: Min guard;Sitting   Lower Body Bathing: Minimal assistance;Sit to/from stand   Upper Body Dressing : Min guard;Sitting   Lower Body Dressing: Minimal assistance;Sit to/from stand   Toilet Transfer: Minimal assistance;Ambulation;RW   Toileting- Clothing Manipulation and Hygiene: Moderate assistance;Sit to/from stand Toileting - Clothing Manipulation Details (indicate cue type and reason): assist  for posterior care     Functional mobility during ADLs: Rolling walker;Minimal assistance;+2 for safety/equipment General ADL Comments: pt limited by  decreased activity tolerance, decreased safety awareness and decreased awareness of deficits     Vision Baseline Vision/History: Wears glasses Wears Glasses: At all times Patient Visual Report: No change from baseline       Perception     Praxis      Pertinent Vitals/Pain Pain Assessment: No/denies pain     Hand Dominance Right   Extremity/Trunk Assessment Upper Extremity Assessment Upper Extremity Assessment: RUE deficits/detail RUE Deficits / Details: pt with previous shoulder injury, reports shoulder never fully returned to baseline functioning;limited shoulder flexion about 50*;pt compensates with use of LUE RUE Sensation: WNL RUE Coordination: decreased gross motor   Lower Extremity Assessment Lower Extremity Assessment: Defer to PT evaluation   Cervical / Trunk Assessment Cervical / Trunk Assessment: Kyphotic   Communication Communication Communication: HOH   Cognition Arousal/Alertness: Awake/alert Behavior During Therapy: Flat affect Overall Cognitive Status: No family/caregiver present to determine baseline cognitive functioning                                 General Comments: pt required increased time for dialing wife's number on the phone (about 1 minute), pt oriented to time, date, place, self;decreased safety awareness, decreased awareness of deficits   General Comments  HR 100-104bpm     Exercises     Shoulder Instructions      Home Living Family/patient expects to be discharged to:: Private residence Living Arrangements: Spouse/significant other Available Help at Discharge: Family;Available 24 hours/day Type of Home: House Home Access: Level entry     Home Layout: One level     Bathroom Shower/Tub: Teacher, early years/pre: Handicapped height     Home Equipment: Environmental consultant - 2 wheels;Shower seat          Prior Functioning/Environment Level of Independence: Independent with assistive device(s)         Comments: pt reports he was independent with use of RW, reports he has a wider RW at home and feels he would benefit from a more narrow one;pt reports he was independent with ADL;reports no hx of falls        OT Problem List: Impaired balance (sitting and/or standing);Decreased safety awareness      OT Treatment/Interventions: Self-care/ADL training;Therapeutic exercise;Energy conservation;DME and/or AE instruction;Therapeutic activities;Patient/family education;Balance training    OT Goals(Current goals can be found in the care plan section) Acute Rehab OT Goals Patient Stated Goal: to go home OT Goal Formulation: With patient Time For Goal Achievement: 05/03/20 Potential to Achieve Goals: Good ADL Goals Pt Will Perform Grooming: with modified independence;standing Pt Will Perform Upper Body Dressing: with modified independence;sitting Pt Will Perform Lower Body Dressing: with modified independence;sit to/from stand Pt Will Transfer to Toilet: with modified independence;ambulating  OT Frequency: Min 2X/week   Barriers to D/C: Decreased caregiver support  pt reports he lives at home with his wife       Co-evaluation PT/OT/SLP Co-Evaluation/Treatment: Yes Reason for Co-Treatment: Complexity of the patient's impairments (multi-system involvement);For patient/therapist safety;To address functional/ADL transfers   OT goals addressed during session: ADL's and self-care      AM-PAC OT "6 Clicks" Daily Activity     Outcome Measure Help from another person eating meals?: A Little Help from another person taking care of personal grooming?: A Little Help from another person toileting,  which includes using toliet, bedpan, or urinal?: A Little Help from another person bathing (including washing, rinsing, drying)?: A Little Help from another person to put on and taking off regular upper body clothing?: A Little Help from another person to put on and taking off regular lower body  clothing?: A Little 6 Click Score: 18   End of Session Equipment Utilized During Treatment: Gait belt;Rolling walker Nurse Communication: Mobility status  Activity Tolerance: Patient tolerated treatment well Patient left: in chair;with call bell/phone within reach;with chair alarm set  OT Visit Diagnosis: Other abnormalities of gait and mobility (R26.89);Muscle weakness (generalized) (M62.81);Other symptoms and signs involving cognitive function                Time: [id number] OT Time Calculation (min): 35 min Charges:  OT General Charges $OT Visit: 1 Visit OT Evaluation $OT Eval Moderate Complexity: [place] OTR/L Acute Rehabilitation Services Office: [place] 04/19/2020, 9:57 AM

## 2020-04-19 NOTE — Progress Notes (Signed)
Bladder scan done,168 ml residual urine detected.Urine output at this time 150 cc.

## 2020-04-19 NOTE — Plan of Care (Signed)
  Problem: Education: Goal: Knowledge of General Education information will improve Description: Including pain rating scale, medication(s)/side effects and non-pharmacologic comfort measures Outcome: Progressing   Problem: Safety: Goal: Ability to remain free from injury will improve Outcome: Progressing   

## 2020-04-19 NOTE — Progress Notes (Signed)
New Admission Note: ? Arrival Method: Stretcher Mental Orientation: Alert x Oriented x4 Telemetry: Box-19 Assessment: Completed Skin: Refer to flowsheet IV: Right Forearm Pain: 0/10 Tubes: None Safety Measures: Safety Fall Prevention Plan discussed with patient. Admission: Completed [facility] [place] Orientation: Patient has been orientated to the room, unit and the staff. Family: None Orders have been reviewed and are being implemented. Will continue to monitor the patient. Call light has been placed within reach and bed alarm has been activated.  ? Milagros Loll, RN  Phone Number: [phone]

## 2020-04-19 NOTE — Evaluation (Signed)
Clinical/Bedside Swallow Evaluation Patient Details  Name: Sergio Mack MRN: [id number] Date of Birth: 1945-12-07  Today's Date: 04/19/2020 Time: SLP Start Time (ACUTE ONLY): 60 SLP Stop Time (ACUTE ONLY): 1046 SLP Time Calculation (min) (ACUTE ONLY): 10 min  Past Medical History:  Past Medical History:  Diagnosis Date  . Diverticulosis 2006  . Erectile dysfunction   . GERD (gastroesophageal reflux disease)   . History of skin cancer   . History of syncope 1994   no etiology; no recurrence  . Hyperlipidemia    LDL goal = < 100, ideally < 70  . Hypertension   . Persistent atrial fibrillation ([place]) 03/06/2014  . PVC's (premature ventricular contractions)   . Rotator cuff tear   . Visit for monitoring Tikosyn therapy 12/19/2017   Past Surgical History:  Past Surgical History:  Procedure Laterality Date  . CARDIOVERSION N/A 12/05/2017   Procedure: CARDIOVERSION;  Surgeon: Dorothy Spark, MD;  Location: [facility] ENDOSCOPY;  Service: Cardiovascular;  Laterality: N/A;  . COLONOSCOPY  2006   Diverticulosis  . CYSTECTOMY     lip  . CYSTECTOMY     anterior thorax-chest  . INGUINAL HERNIA REPAIR      x 2  . 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 SHOULDER OPEN ROTATOR CUFF REPAIR Right 04/23/2014   Procedure: RIGHT SHOULDER ROTATOR CUFF REPAIR WITH GRAFT AND ANCHORS . OPEN ACROMIONECTOMY;  Surgeon: Tobi Bastos, MD;  Location: [facility] ORS;  Service: Orthopedics;  Laterality: Right;  . SPINE SURGERY  2012   Dr Gladstone Lighter  . TEE WITHOUT CARDIOVERSION N/A 12/05/2017   Procedure: TRANSESOPHAGEAL ECHOCARDIOGRAM (TEE);  Surgeon: Dorothy Spark, MD;  Location: [facility] ENDOSCOPY;  Service: Cardiovascular;  Laterality: N/A;  . TONSILLECTOMY AND ADENOIDECTOMY     HPI:  Pt is a 74 y.o. male with medical history significant of atrial fibrillation on  chronic anticoagulation with Eliquis, alcohol abuse, GERD, hyperlipidemia, who presented to the ED with worsening confusion for the 1-2 days, decreased oral intake, generalized weakness, and "thick/slurred speech". CXR 5/29: Slight left base atelectasis. Lungs elsewhere clear. MRI brain negative for acute changes.    Assessment / Plan / Recommendation Clinical Impression  Pt was seen for bedside swallow evaluation and he denied a history of dysphagia. Oral mechanism exam was [facility] with adequate dentition but he exhibited some difficulty following commands due to hearing impairment. He tolerated all solids and liquids without signs or symptoms of oropharyngeal dysphagia. A regular texture diet with thin liquids is recommended at this time and further skilled SLP services are not clinically indicated for swallowing.  SLP Visit Diagnosis: Dysphagia, unspecified (R13.10)    Aspiration Risk  No limitations    Diet Recommendation Regular;Thin liquid   Liquid Administration via: Straw;Cup Medication Administration: Whole meds with liquid Supervision: Patient able to self feed Compensations: Slow rate;Small sips/bites Postural Changes: Seated upright at 90 degrees    Other  Recommendations Oral Care Recommendations: Oral care BID   Follow up Recommendations Other (comment)(TBD)      Frequency and Duration            Prognosis Barriers to Reach Goals: Cognitive deficits      Swallow Study   General Date of Onset: 04/18/20 HPI: Pt is a 74 y.o. male with medical history significant of atrial fibrillation on chronic anticoagulation with Eliquis, alcohol abuse, GERD, hyperlipidemia,  who presented to the ED with worsening confusion for the 1-2 days, decreased oral intake, generalized weakness, and "thick/slurred speech". CXR 5/29: Slight left base atelectasis. Lungs elsewhere clear. MRI brain negative for acute changes.  Type of Study: Bedside Swallow Evaluation Previous Swallow Assessment:  None Diet Prior to this Study: Regular;Thin liquids Temperature Spikes Noted: No Respiratory Status: Room air History of Recent Intubation: No Behavior/Cognition: Alert;Cooperative;Confused Oral Cavity Assessment: Within Functional Limits Oral Care Completed by SLP: No Oral Cavity - Dentition: Adequate natural dentition Vision: Functional for self-feeding Self-Feeding Abilities: Able to feed self Patient Positioning: Upright in bed Baseline Vocal Quality: Normal Volitional Cough: Strong Volitional Swallow: Unable to elicit    Oral/Motor/Sensory Function Overall Oral Motor/Sensory Function: Within functional limits   Ice Chips Ice chips: Within functional limits Presentation: Spoon   Thin Liquid Thin Liquid: Within functional limits Presentation: Straw    Nectar Thick Nectar Thick Liquid: Not tested   Honey Thick Honey Thick Liquid: Not tested   Puree Puree: Within functional limits Presentation: Spoon   Solid     Solid: Within functional limits Presentation: [place] I. Hardin Negus, [place], [place] Office number [phone] Pager [place] 04/19/2020,11:32 AM

## 2020-04-19 NOTE — Evaluation (Signed)
Physical Therapy Evaluation Patient Details Name: Sergio Mack MRN: [id number] DOB: 11/26/1945 Today's Date: 04/19/2020   History of Present Illness  Sergio Mack is a 74 y.o. male with medical history significant of atrial fibrillation on chronic anticoagulation with Eliquis, history of alcohol abuse, gastroesophageal reflux disease, hyperlipidemia, who presents to the ED with worsening confusion for the past 1 to 2 days, decreased oral intake, generalized weakness, thick/slurred speech.  fell off his bed on the day of admission, with generalized weakness and patient also noted to have some dysarthric/slurred speech; According to wife, pt was trying to alcohol detx with supervision of PCP, starting a few days prior to admission  Clinical Impression   Pt admitted with above diagnosis. Prior to admission, pt was living at home with his wife, in a single level home with a level entry; Pt reports he was independent with ADL/IADL and modified independent with functional mobility at RW level. Pt reports he was driving until he started drinking again. Pt reports he was sober for 30 years. Pt currently requires minA for functional mobility at RW level, he demonstrates decreased activity tolerance, tolerating standing at sink level for about 5 minutes. Pt presents with very small steps, resembling festination, during mobility and fasciculations after standing for 5 min impacting his safety with functional mobility.  Pt currently with functional limitations due to the deficits listed below (see PT Problem List). I am hopeful that as he goes through and finishes withdrawal and detox, his functional mobility will improve well; will monitor for progress; Lengthy discussion re: dc planning -- at this time, he will need physical rehab for strengthening, and to maximize independence, endurance, and safety with mobility, and decr fall risk prior to dc home;  Pt will benefit from skilled PT to increase their  independence and safety with mobility to allow discharge to the venue listed below.       Follow Up Recommendations SNF(He very much wants to dc home -- if he progresses well, will be worth considering the [facility] for a more rehab-focused home health program)    Equipment Recommendations  3in1 (PT);Other (comment)(will watch for progress, and consider 4 wheeled RW)    Recommendations for Other Services OT consult(as ordered)     Precautions / Restrictions Precautions Precautions: Fall Restrictions Weight Bearing Restrictions: No      Mobility  Bed Mobility Overal bed mobility: Needs Assistance Bed Mobility: Supine to Sit     Supine to sit: Min guard;HOB elevated     General bed mobility comments: increased time and effort  Transfers Overall transfer level: Needs assistance Equipment used: Rolling walker (2 wheeled) Transfers: Sit to/from Stand Sit to Stand: Min assist         General transfer comment: minA for safety, vc for safe hand placement, minA for powerup   Ambulation/Gait Ambulation/Gait assistance: Min guard;Min assist Gait Distance (Feet): 20 Feet(bed>sink>BSC>recliner) Assistive device: Rolling walker (2 wheeled) Gait Pattern/deviations: Shuffle;Festinating;Decreased step length - right;Decreased step length - left;Decreased stride length;Trunk flexed     General Gait Details: Short, festinating-like steps with decr weight shift into single limb stance; dependence on UE support for steadiness; noteworthy incr shakiness with incr time on feet due to fatigue, which increases fall risk  Stairs            Wheelchair Mobility    Modified Rankin (Stroke Patients Only)       Balance Overall balance assessment: Needs assistance Sitting-balance support: No upper extremity supported;Feet supported  Sitting balance-Leahy Scale: Fair     Standing balance support: Single extremity supported;During functional activity Standing  balance-Leahy Scale: Fair Standing balance comment: required at least single UE support for stability;able to static stand without UE support for short duration;decreased activity tolerance, after standing at sink level for about 5 min noticed slight fasciculations                              Pertinent Vitals/Pain Pain Assessment: No/denies pain    Home Living Family/patient expects to be discharged to:: Private residence Living Arrangements: Spouse/significant other Available Help at Discharge: Family;Available 24 hours/day Type of Home: House Home Access: Level entry     Home Layout: One level Home Equipment: Walker - 2 wheels;Shower seat      Prior Function Level of Independence: Independent with assistive device(s)         Comments: pt reports he was independent with use of RW, reports he has a wider RW at home and feels he would benefit from a more narrow one;pt reports he was independent with ADL;reports no hx of falls     Hand Dominance   Dominant Hand: Right    Extremity/Trunk Assessment   Upper Extremity Assessment Upper Extremity Assessment: Defer to OT evaluation RUE Deficits / Details: pt with previous shoulder injury, reports shoulder never fully returned to baseline functioning;limited shoulder flexion about 50*;pt compensates with use of LUE RUE Sensation: WNL RUE Coordination: decreased gross motor    Lower Extremity Assessment Lower Extremity Assessment: Generalized weakness(and decr coordination)    Cervical / Trunk Assessment Cervical / Trunk Assessment: Kyphotic  Communication   Communication: HOH  Cognition Arousal/Alertness: Awake/alert Behavior During Therapy: Flat affect Overall Cognitive Status: No family/caregiver present to determine baseline cognitive functioning                                 General Comments: pt required increased time for dialing wife's number on the phone (about 1 minute), pt oriented to  time, date, place, self;decreased safety awareness, decreased awareness of deficits      General Comments General comments (skin integrity, edema, etc.): HR 100-104bpm; had a small BM on BSC during session    Exercises     Assessment/Plan    PT Assessment Patient needs continued PT services  PT Problem List Decreased strength;Decreased range of motion;Decreased activity tolerance;Decreased balance;Decreased mobility;Decreased coordination;Decreased cognition;Decreased knowledge of use of DME;Decreased safety awareness;Decreased knowledge of precautions       PT Treatment Interventions DME instruction;Gait training;Functional mobility training;Stair training;Therapeutic activities;Therapeutic exercise;Balance training;Neuromuscular re-education;Cognitive remediation;Patient/family education    PT Goals (Current goals can be found in the Care Plan section)  Acute Rehab PT Goals Patient Stated Goal: to go home PT Goal Formulation: With patient Time For Goal Achievement: 05/03/20 Potential to Achieve Goals: Good    Frequency Min 3X/week   Barriers to discharge Other (comment) Will need to reach at least Supervision functional level, and incr endurance/actitity tolerance to be able to safely dc home    Co-evaluation PT/OT/SLP Co-Evaluation/Treatment: Yes Reason for Co-Treatment: Complexity of the patient's impairments (multi-system involvement);For patient/therapist safety;To address functional/ADL transfers PT goals addressed during session: Mobility/safety with mobility OT goals addressed during session: ADL's and self-care       AM-PAC PT "6 Clicks" Mobility  Outcome Measure Help needed turning from your back to your side while in a flat bed  without using bedrails?: None Help needed moving from lying on your back to sitting on the side of a flat bed without using bedrails?: None Help needed moving to and from a bed to a chair (including a wheelchair)?: A Little Help needed  standing up from a chair using your arms (e.g., wheelchair or bedside chair)?: A Little Help needed to walk in hospital room?: A Lot Help needed climbing 3-5 steps with a railing? : Total 6 Click Score: 17    End of Session Equipment Utilized During Treatment: Gait belt Activity Tolerance: Patient tolerated treatment well Patient left: in chair;with call bell/phone within reach;with chair alarm set Nurse Communication: Mobility status PT Visit Diagnosis: Unsteadiness on feet (R26.81);Other abnormalities of gait and mobility (R26.89);Muscle weakness (generalized) (M62.81)    Time: [id number] PT Time Calculation (min) (ACUTE ONLY): 34 min   Charges:   PT Evaluation $PT Eval Moderate Complexity: 1 Mod          Roney [place], [place]  Acute Rehabilitation Services Pager [phone] Office [phone]   [place] 04/19/2020, 10:15 AM

## 2020-04-19 NOTE — Evaluation (Signed)
Speech Language Pathology Evaluation Patient Details Name: Sergio Mack MRN: [id number] DOB: 06/24/1946 Today's Date: 04/19/2020 Time: [id number] SLP Time Calculation (min) (ACUTE ONLY): 12 min  Problem List:  Patient Active Problem List   Diagnosis Date Noted  . Acute metabolic encephalopathy [id number]  . Acute lower UTI 04/18/2020  . Alcohol withdrawal ([place]) 04/18/2020  . Weakness generalized 04/18/2020  . Dehydration 04/18/2020  . Chronic alcoholism ([place])   . Iron deficiency anemia 11/25/2019  . Difficulty sleeping 11/21/2019  . Hypomagnesemia 05/15/2019  . Alcoholism ([place]) 05/14/2019  . Cough 10/31/2018  . Hyponatremia 10/30/2018  . Alcohol withdrawal seizure ([place]) 06/26/2018  . History of alcohol abuse 06/25/2018  . Leukocytosis 06/25/2018  . Persistent atrial fibrillation ([place]) 12/19/2017  . HFrEF (heart failure with reduced ejection fraction) ([place]) 12/04/2017  . Memory difficulties 10/26/2017  . 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 Sensorineural hearing loss (SNHL), 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  . Prediabetes 04/07/2016  . Full thickness rotator cuff tear 04/23/2014  . Anticoagulation adequate, new on eliquis 03/07/2014  . SKIN CANCER, HX OF 03/24/2010  . DIVERTICULOSIS, COLON 12/11/2008  . SPINAL STENOSIS 01/14/2008  . BACK PAIN, LUMBAR, WITH RADICULOPATHY 01/02/2008  . Essential hypertension 10/31/2007  . 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 GERD 09/27/2007   Past Medical History:  Past Medical History:  Diagnosis Date  . Diverticulosis 2006  . Erectile dysfunction   . GERD (gastroesophageal reflux disease)   . History of skin cancer   . History of syncope 1994   no etiology; no recurrence  . Hyperlipidemia    LDL goal = < 100, ideally < 70  . Hypertension   . Persistent atrial fibrillation ([place]) 03/06/2014  . PVC's (premature ventricular contractions)   . Rotator cuff tear   . Visit for monitoring Tikosyn therapy 12/19/2017    Past Surgical History:  Past Surgical History:  Procedure Laterality Date  . CARDIOVERSION N/A 12/05/2017   Procedure: CARDIOVERSION;  Surgeon: Dorothy Spark, MD;  Location: [facility] ENDOSCOPY;  Service: Cardiovascular;  Laterality: N/A;  . COLONOSCOPY  2006   Diverticulosis  . CYSTECTOMY     lip  . CYSTECTOMY     anterior thorax-chest  . INGUINAL HERNIA REPAIR      x 2  . 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 SHOULDER OPEN ROTATOR CUFF REPAIR Right 04/23/2014   Procedure: RIGHT SHOULDER ROTATOR CUFF REPAIR WITH GRAFT AND ANCHORS . OPEN ACROMIONECTOMY;  Surgeon: Tobi Bastos, MD;  Location: [facility] ORS;  Service: Orthopedics;  Laterality: Right;  . SPINE SURGERY  2012   Dr Gladstone Lighter  . TEE WITHOUT CARDIOVERSION N/A 12/05/2017   Procedure: TRANSESOPHAGEAL ECHOCARDIOGRAM (TEE);  Surgeon: Dorothy Spark, MD;  Location: [facility] ENDOSCOPY;  Service: Cardiovascular;  Laterality: N/A;  . TONSILLECTOMY AND ADENOIDECTOMY     HPI:  Pt is a 74 y.o. male with medical history significant of atrial fibrillation on chronic anticoagulation with Eliquis, alcohol abuse, GERD, hyperlipidemia, who presented to the ED with worsening confusion for the 1-2 days, decreased oral intake, generalized weakness, and "thick/slurred speech". CXR 5/29: Slight left base atelectasis. Lungs elsewhere clear. MRI brain negative for acute changes.    Assessment / Plan / Recommendation Clinical Impression  Pt participated in speech/language evaluation. Pt presented with a significant hearing impairment  and repetition was frequently needed throughout the  evaluation due to pt not hearing the SLP's questions and responding inappropriately. He stated that he lives with his wife and that she has been managing his medications and their finances. Articulatory precision was reduced but  speech was intelligible and pt consistently reported that his speech is now at baseline. No language deficits were observed, but repetition was needed during auditory comprehension tasks due to difficulty with hearing. He demonstrated impairments in memory and higher-level executive functioning which pt stated were his baseline and family was not present to corroborate this report. SLP services will be discontinued at this time, but may be initiated at the next venue of care if it is subsequently determined that the pt is not at baseline.    SLP Assessment  SLP Recommendation/Assessment: Patient does not need any further Speech Lanaguage Pathology Services SLP Visit Diagnosis: Cognitive communication deficit (R41.841)    Follow Up Recommendations  None    Frequency and Duration           SLP Evaluation Cognition  Overall Cognitive Status: No family/caregiver present to determine baseline cognitive functioning(But pt reports cognition is at baseline) Arousal/Alertness: Awake/alert Orientation Level: Oriented X4 Attention: Focused;Sustained Focused Attention: Appears intact Sustained Attention: Appears intact Memory: Impaired Memory Impairment: Retrieval deficit;Decreased recall of new information Awareness: Impaired Awareness Impairment: Emergent impairment Problem Solving: Impaired Problem Solving Impairment: Verbal complex Executive Function: Reasoning Reasoning: Impaired       Comprehension  Auditory Comprehension Overall Auditory Comprehension: Appears within functional limits for tasks assessed Yes/No Questions: Within Functional Limits Commands: Within Functional Limits Conversation: Complex Interfering Components: Hearing;Processing speed    Expression Expression Primary Mode of Expression: Verbal Verbal Expression Overall Verbal Expression: Appears within functional limits for tasks assessed Initiation: No impairment Level of Generative/Spontaneous Verbalization:  Conversation Repetition: No impairment Naming: No impairment Pragmatics: No impairment   Oral / Motor  Oral Motor/Sensory Function Overall Oral Motor/Sensory Function: Within functional limits Motor Speech Overall Motor Speech: Impaired Respiration: Within functional limits Phonation: Normal Resonance: Within functional limits Articulation: Impaired Level of Impairment: Conversation Intelligibility: Intelligible Motor Planning: Witnin functional limits Motor Speech Errors: Aware;Consistent   [place] I. Hardin Negus, [place], [place] Office number [phone] Pager [phone]                   Horton [place] 04/19/2020, 2:16 PM

## 2020-04-19 NOTE — Progress Notes (Signed)
In and Out done on patient this am.  Patient tolerated procedure well. 978 ml drained from baldder. Will continue to monitor.   Jesyca Weisenburger,RN.

## 2020-04-20 LAB — URINE CULTURE: Culture: >=100000 — AB

## 2020-04-20 LAB — CBC WITH DIFFERENTIAL/PLATELET
Abs Immature Granulocytes: 0.08 10*3/uL — ABNORMAL HIGH (ref 0.00–0.07)
Basophils Absolute: 0 10*3/uL (ref 0.0–0.1)
Basophils Relative: 0 %
Eosinophils Absolute: 0.3 10*3/uL (ref 0.0–0.5)
Eosinophils Relative: 4 %
HCT: 37.7 % — ABNORMAL LOW (ref 39.0–52.0)
Hemoglobin: 12.3 g/dL — ABNORMAL LOW (ref 13.0–17.0)
Immature Granulocytes: 1 %
Lymphocytes Relative: 18 %
Lymphs Abs: 1.7 10*3/uL (ref 0.7–4.0)
MCH: 29.1 pg (ref 26.0–34.0)
MCHC: 32.6 g/dL (ref 30.0–36.0)
MCV: 89.1 fL (ref 80.0–100.0)
Monocytes Absolute: 1 10*3/uL (ref 0.1–1.0)
Monocytes Relative: 11 %
Neutro Abs: 6 10*3/uL (ref 1.7–7.7)
Neutrophils Relative %: 66 %
Platelets: 143 10*3/uL — ABNORMAL LOW (ref 150–400)
RBC: 4.23 MIL/uL (ref 4.22–5.81)
RDW: 14.7 % (ref 11.5–15.5)
WBC: 9 10*3/uL (ref 4.0–10.5)
nRBC: 0 % (ref 0.0–0.2)

## 2020-04-20 LAB — RENAL FUNCTION PANEL
Albumin: 2.7 g/dL — ABNORMAL LOW (ref 3.5–5.0)
Anion gap: 10 (ref 5–15)
BUN: 7 mg/dL — ABNORMAL LOW (ref 8–23)
CO2: 24 mmol/L (ref 22–32)
Calcium: 8.4 mg/dL — ABNORMAL LOW (ref 8.9–10.3)
Chloride: 103 mmol/L (ref 98–111)
Creatinine, Ser: 0.95 mg/dL (ref 0.61–1.24)
GFR calc Af Amer: >60 mL/min (ref >60)
GFR calc non Af Amer: >60 mL/min (ref >60)
Glucose, Bld: 129 mg/dL — ABNORMAL HIGH (ref 70–99)
Phosphorus: 3.9 mg/dL (ref 2.5–4.6)
Potassium: 3.6 mmol/L (ref 3.5–5.1)
Sodium: 137 mmol/L (ref 135–145)

## 2020-04-20 LAB — MAGNESIUM: Magnesium: 1.7 mg/dL (ref 1.7–2.4)

## 2020-04-20 MED ORDER — POTASSIUM CHLORIDE CRYS ER 20 MEQ PO TBCR
40.0000 meq | EXTENDED_RELEASE_TABLET | Freq: Once | ORAL | Status: AC
  Administered 2020-04-20: 40 meq via ORAL
  Filled 2020-04-20: qty 2

## 2020-04-20 NOTE — Progress Notes (Signed)
Occupational Therapy Treatment Patient Details Name: Sergio Mack MRN: [id number] DOB: [birth year] Today's Date: 04/20/2020    History of present illness Sergio Mack is a 74 y.o. male with medical history significant of atrial fibrillation on chronic anticoagulation with Eliquis, history of alcohol abuse, gastroesophageal reflux disease, hyperlipidemia, who presents to the ED with worsening confusion for the past 1 to 2 days, decreased oral intake, generalized weakness, thick/slurred speech.  fell off his bed on the day of admission, with generalized weakness and patient also noted to have some dysarthric/slurred speech; According to wife, pt was trying to alcohol detx with supervision of PCP, starting a few days prior to admission   OT comments  Pt continues to present with decreased cognition, awareness, and balance. Pt with tangential thought/conversation, bumping into multiple objects (left and right side), and requiring increased cues throughout session. Pt reporting he wants to go home as seen as possible. Called and discussed support at home with wife and she reports she also has physical deficits and does not feel she can assist pt with his current cognitive and physical deficits. Will continue to provide therapy at acute level to optimize return to PLOF. Continue to recommend dc to SNF for further OT/PT increasing his safety before return to home.    Follow Up Recommendations  SNF;Supervision/Assistance - 24 hour    Equipment Recommendations  3 in 1 bedside commode    Recommendations for Other Services      Precautions / Restrictions Precautions Precautions: Fall Restrictions Weight Bearing Restrictions: No       Mobility Bed Mobility Overal bed mobility: Needs Assistance Bed Mobility: Supine to Sit     Supine to sit: Min guard;HOB elevated     General bed mobility comments: increased time and effort  Transfers Overall transfer level: Needs assistance Equipment  used: Rolling walker (2 wheeled) Transfers: Sit to/from Stand Sit to Stand: Min guard         General transfer comment: Min Guard A for safety    Balance Overall balance assessment: Needs assistance Sitting-balance support: No upper extremity supported;Feet supported Sitting balance-Leahy Scale: Fair     Standing balance support: Single extremity supported;During functional activity Standing balance-Leahy Scale: Fair Standing balance comment: required at least single UE support for stability;able to static stand without UE support for short duration;decreased activity tolerance, after standing at sink level for about 5 min noticed slight fasciculations                            ADL either performed or assessed with clinical judgement   ADL Overall ADL's : Needs assistance/impaired     Grooming: Min guard;Wash/dry hands;Standing                   Toilet Transfer: Ambulation;RW;Min guard;Regular Toilet;Grab bars   Toileting- Water quality scientist and Hygiene: Min guard;Sit to/from stand       Functional mobility during ADLs: Min guard;+2 for safety/equipment;Rolling walker General ADL Comments: Min Guard A for safety throughout. Requiring increased cues.      Vision       Perception     Praxis      Cognition Arousal/Alertness: Awake/alert Behavior During Therapy: Flat affect Overall Cognitive Status: Impaired/Different from baseline Area of Impairment: Safety/judgement;Awareness;Problem solving;Attention                   Current Attention Level: Selective     Safety/Judgement: Decreased awareness of deficits;Decreased  awareness of safety Awareness: Emergent Problem Solving: Slow processing;Requires verbal cues General Comments: Per conversation with wife over the phone, pt seems "out of it" and "not himself." SHe reports that during her visits and phone conversation, he is not at baseline cognition. During session, pt with tangietal  conversation and requiring increased cues throughout. Pt also bumping into several objects with RW (on L and R) demonstrating poor anticipetory awareness of problems.         Exercises     Shoulder Instructions       General Comments Called and discussed dc options with wife. Pt's wife reporting she does not feel she can meet the pt's needs with his current cognitive and physical state.    Pertinent Vitals/ Pain       Pain Assessment: No/denies pain  Home Living Family/patient expects to be discharged to:: Private residence Living Arrangements: Spouse/significant other Available Help at Discharge: Family;Available 24 hours/day Type of Home: House Home Access: Level entry     Home Layout: One level     Bathroom Shower/Tub: Teacher, early years/pre: Handicapped height     Home Equipment: Environmental consultant - 2 wheels;Shower seat          Prior Functioning/Environment Level of Independence: Independent with assistive device(s)            Frequency  Min 2X/week        Progress Toward Goals  OT Goals(current goals can now be found in the care plan section)  Progress towards OT goals: Progressing toward goals  Acute Rehab OT Goals Patient Stated Goal: to go home OT Goal Formulation: With patient Time For Goal Achievement: 05/03/20 Potential to Achieve Goals: Good ADL Goals Pt Will Perform Grooming: with modified independence;standing Pt Will Perform Upper Body Dressing: with modified independence;sitting Pt Will Perform Lower Body Dressing: with modified independence;sit to/from stand Pt Will Transfer to Toilet: with modified independence;ambulating  Plan Discharge plan remains appropriate    Co-evaluation    PT/OT/SLP Co-Evaluation/Treatment: Yes Reason for Co-Treatment: For patient/therapist safety;To address functional/ADL transfers   OT goals addressed during session: ADL's and self-care      AM-PAC OT "6 Clicks" Daily Activity     Outcome  Measure   Help from another person eating meals?: A Little Help from another person taking care of personal grooming?: A Little Help from another person toileting, which includes using toliet, bedpan, or urinal?: A Little Help from another person bathing (including washing, rinsing, drying)?: A Little Help from another person to put on and taking off regular upper body clothing?: A Little Help from another person to put on and taking off regular lower body clothing?: A Little 6 Click Score: 18    End of Session Equipment Utilized During Treatment: Gait belt;Rolling walker  OT Visit Diagnosis: Other abnormalities of gait and mobility (R26.89);Muscle weakness (generalized) (M62.81);Other symptoms and signs involving cognitive function   Activity Tolerance Patient tolerated treatment well   Patient Left in chair;with call bell/phone within reach;with chair alarm set   Nurse Communication Mobility status        Time: [id number] OT Time Calculation (min): 45 min  Charges: OT General Charges $OT Visit: 1 Visit OT Treatments $Self Care/Home Management : 23-37 mins  [place], OTR/L Acute Rehab Pager: [phone] Office: [place] 04/20/2020, 4:57 PM

## 2020-04-20 NOTE — Progress Notes (Signed)
PROGRESS NOTE    Sergio Mack  [id number] DOB: 01-07-1946 DOA: 04/18/2020 PCP: Binnie Rail, MD    Chief Complaint  Patient presents with   Weakness    Brief Narrative:  Patient 74 year old gentleman history of A. fib on chronic anticoagulation with Eliquis, history of alcohol abuse, GERD, hyperlipidemia presented to the ED with worsening confusion x2 days, decreased oral intake, weakness.  Patient admitted and being treated for alcohol withdrawal and UTI.   Assessment & Plan:   Principal Problem:   Acute metabolic encephalopathy Active Problems:   Acute lower UTI   Essential hypertension   GERD   Memory difficulties   HFrEF (heart failure with reduced ejection fraction) (HCC)   Persistent atrial fibrillation (HCC)   History of alcohol abuse   Iron deficiency anemia   Alcohol withdrawal (HCC)   Generalized weakness   Dehydration  1 acute metabolic encephalopathy Multifactorial secondary to acute UTI and alcohol withdrawal syndrome.  Patient per wife noted to have recently been started on a Librium detox protocol 2 days prior to admission.  Patient's last drink was 2 days prior to admission when he had gone on a 1 week alcohol binge.  Urinalysis done on admission concerning for UTI.  Urine culture with Staphylococcus epidermidis.  CT head negative.  MRI negative.  Patient afebrile.  Chest x-ray negative for any acute abnormalities.  Ammonia levels within normal limits.  Patient improving clinically.  Continue IV fluids, IV Rocephin, Librium detox protocol, Ativan withdrawal protocol.  Supportive care.  2.  UTI Urine cultures with staph epidermidis.  Continue IV Rocephin through today and discontinue after today's dose.   3.  Gastroesophageal reflux disease PPI.  4.  Alcohol withdrawal/alcohol abuse Patient presented with confusion and generalized weakness.  Patient per wife had gone on a 1 week alcohol binge.  Last alcohol use was 2 days prior to admission.  Per  wife patient with prior history of alcoholism and patient noted to have been sober for approximately 35 years and subsequently resumed drinking 2 years ago intermittently.  Per wife patient had been started on Librium taper per PCP for alcohol detox.  Patient currently on the Librium detox protocol as well as the Ativan withdrawal protocol.  Continue thiamine, folic acid, multivitamin.  Supportive care.  Per wife patient has signed up for an intensive outpatient alcohol detox program which patient will go to post discharge.  5.  Dehydration Improved with hydration.  Saline lock IV fluids.    6.  Generalized weakness Likely secondary to UTI and alcohol withdrawal.  Improving clinically.  PT/OT.  PT recommending SNF however patient really wants to go home and if insistent on going home may need to order home health.  7.  Persistent atrial fibrillation Currently in normal sinus rhythm.  Continue home regimen Tikosyn and metoprolol for rate control.  Eliquis for anticoagulation.  8.  Leukocytosis Likely secondary to UTI versus reactive.  Urine cultures with staph epidermidis.  Chest x-ray negative for any acute infiltrate.  Continue IV Rocephin through today and discontinue after today's dose.  9.  Hypokalemia/Hypophosphatemia Potassium at 3.6.  Patient on Tikosyn and as such we will try to keep potassium close to 4.  K. Dur 40 mEq p.o. x1.  Check a magnesium level.  Phosphorus repleted and currently at 3.9 this morning.  Follow.      DVT prophylaxis: Eliquis Code Status: DNR Family Communication: Updated patient.  No family at bedside. Disposition:   Status is: Observation  Dispo: The patient is from: Home              Anticipated d/c is to: To be determined.  Likely home with home health versus SNF              Anticipated d/c date is: 04/21/2020 or 04/22/2020              Patient currently on Librium and Ativan withdrawal protocol, IV antibiotics.       Consultants:    None  Procedures:   MRI brain 04/18/2020  CT head 04/18/2020  Chest x-ray 04/18/2020  Antimicrobials:   IV Rocephin 04/18/2020>>>>>> 04/20/2020   Subjective: Patient hard of hearing.  Patient sleeping but easily arousable.  Alert and oriented to self place and time.  Denies any chest pain, no shortness of breath.  Asking when he can go home.  Tolerating current diet.   Objective: Vitals:   04/19/20 2152 04/19/20 2200 04/20/20 0522 04/20/20 0835  BP: 135/80 135/80 140/81 137/65  Pulse: 85 85 79 79  Resp:  16 16 20   Temp:  98.2 F (36.8 C) 98.5 F (36.9 C) 98 F (36.7 C)  TempSrc:  Oral  Oral  SpO2:  98% 98% 99%  Weight:      Height:        Intake/Output Summary (Last 24 hours) at 04/20/2020 0953 Last data filed at 04/20/2020 0900 Gross per 24 hour  Intake 2264.05 ml  Output 500 ml  Net 1764.05 ml   Filed Weights   04/18/20 1036 04/18/20 2149  Weight: 88 kg 86.2 kg    Examination:  General exam: NAD Respiratory system: CTAB.  No wheezes, no crackles, no rhonchi.  Normal respiratory effort.   Cardiovascular system: Regular rate and rhythm no murmurs rubs or gallops.  No JVD.  No lower extremity edema. Gastrointestinal system: Abdomen is soft, nontender, nondistended, positive bowel sounds.  No rebound.  No guarding. Central nervous system: Alert and oriented. No focal neurological deficits. Extremities: Symmetric 5 x 5 power. Skin: No rashes, lesions or ulcers Psychiatry: Judgement and insight appear normal. Mood & affect appropriate.     Data Reviewed: I have personally reviewed following labs and imaging studies  CBC: Recent Labs  Lab 04/15/20 1101 04/18/20 1024 04/19/20 0644 04/20/20 0315  WBC 12.1* 10.8* 8.6 9.0  NEUTROABS  --   --   --  6.0  HGB 15.1 14.0 12.7* 12.3*  HCT 46.1 42.1 38.7* 37.7*  MCV 89.3 87.0 89.0 89.1  PLT 224 140* 121* 143*    Basic Metabolic Panel: Recent Labs  Lab 04/15/20 1101 04/18/20 1024 04/18/20 2158  04/19/20 0644 04/20/20 0315  NA 125* 135  --  136 137  K 5.0 4.1  --  3.1* 3.6  CL 91* 98  --  104 103  CO2 17* 23  --  25 24  GLUCOSE 86 145*  --  128* 129*  BUN 14 22  --  11 7*  CREATININE 0.91 1.13  --  0.87 0.95  CALCIUM 8.0* 9.0  --  8.3* 8.4*  MG  --   --  1.8  --   --   PHOS  --   --  2.0*  --  3.9    GFR: Estimated Creatinine Clearance: 77.1 mL/min (by C-G formula based on SCr of 0.95 mg/dL).  Liver Function Tests: Recent Labs  Lab 04/15/20 1101 04/18/20 1024 04/19/20 0644 04/20/20 0315  AST 55* 26 21  --   ALT  26 17 15   --   ALKPHOS 73 61 59  --   BILITOT 1.5* 1.6* 0.8  --   PROT 6.5 6.7 6.1*  --   ALBUMIN 3.6 3.6 3.0* 2.7*    CBG: Recent Labs  Lab 04/18/20 1020  GLUCAP 143*     Recent Results (from the past 240 hour(s))  Urine Culture     Status: Abnormal   Collection Time: 04/18/20 12:21 PM   Specimen: Urine, Random  Result Value Ref Range Status   Specimen Description URINE, RANDOM  Final   Special Requests   Final    NONE Performed at [facility], [place] [address], [place], [place] [address]    Culture >=100,000 COLONIES/mL STAPHYLOCOCCUS EPIDERMIDIS (A)  Final   Report Status 04/20/2020 FINAL  Final   Organism ID, Bacteria STAPHYLOCOCCUS EPIDERMIDIS (A)  Final      Susceptibility   Staphylococcus epidermidis - MIC*    CIPROFLOXACIN <=0.5 SENSITIVE Sensitive     GENTAMICIN <=0.5 SENSITIVE Sensitive     NITROFURANTOIN <=16 SENSITIVE Sensitive     OXACILLIN <=0.25 SENSITIVE Sensitive     TETRACYCLINE 2 SENSITIVE Sensitive     VANCOMYCIN 1 SENSITIVE Sensitive     TRIMETH/SULFA >=320 RESISTANT Resistant     CLINDAMYCIN <=0.25 SENSITIVE Sensitive     RIFAMPIN <=0.5 SENSITIVE Sensitive     Inducible Clindamycin NEGATIVE Sensitive     * >=100,000 COLONIES/mL STAPHYLOCOCCUS EPIDERMIDIS  SARS Coronavirus 2 by RT PCR (hospital order, performed in [place] hospital lab) Nasopharyngeal Nasopharyngeal Swab     Status: None   Collection  Time: 04/18/20  4:27 PM   Specimen: Nasopharyngeal Swab  Result Value Ref Range Status   SARS Coronavirus 2 NEGATIVE NEGATIVE Final    Comment: (NOTE) SARS-CoV-2 target nucleic acids are NOT DETECTED. The SARS-CoV-2 RNA is generally detectable in upper and lower respiratory specimens during the acute phase of infection. The lowest concentration of SARS-CoV-2 viral copies this assay can detect is 250 copies / mL. A negative result does not preclude SARS-CoV-2 infection and should not be used as the sole basis for treatment or other patient management decisions.  A negative result may occur with improper specimen collection / handling, submission of specimen other than nasopharyngeal swab, presence of viral mutation(s) within the areas targeted by this assay, and inadequate number of viral copies (<250 copies / mL). A negative result must be combined with clinical observations, patient history, and epidemiological information. Fact Sheet for Patients:   StrictlyIdeas.no Fact Sheet for Healthcare Providers: BankingDealers.co.za This test is not yet approved or cleared  by the [place] FDA and has been authorized for detection and/or diagnosis of SARS-CoV-2 by FDA under an Emergency Use Authorization (EUA).  This EUA will remain in effect (meaning this test can be used) for the duration of the COVID-19 declaration under Section 564(b)(1) of the Act, 21 U.S.C. section 360bbb-3(b)(1), unless the authorization is terminated or revoked sooner. Performed at [facility], [place] [address] [place], [place] [address]          Radiology Studies: CT HEAD WO CONTRAST  Result Date: 04/18/2020 CLINICAL DATA:  Altered mental status. EXAM: CT HEAD WITHOUT CONTRAST TECHNIQUE: Contiguous axial images were obtained from the base of the skull through the vertex without intravenous contrast. COMPARISON:  None. FINDINGS: Brain: No acute  intracranial hemorrhage. No focal mass lesion. No CT evidence of acute infarction. No midline shift or mass effect. No hydrocephalus. Basilar cisterns are patent.  There are periventricular and subcortical white matter hypodensities. Generalized cortical atrophy. Vascular: No hyperdense vessel or unexpected calcification. Skull: Normal. Negative for fracture or focal lesion. Sinuses/Orbits: Paranasal sinuses and mastoid air cells are clear. Orbits are clear. Other: None. IMPRESSION: 1. No acute intracranial findings. 2. Mild atrophy and moderate periventricular white matter small vessel ischemia Electronically Signed   By: Suzy Bouchard M.D.   On: 04/18/2020 11:20   MR BRAIN WO CONTRAST  Result Date: 04/18/2020 CLINICAL DATA:  Altered mental status EXAM: MRI HEAD WITHOUT CONTRAST TECHNIQUE: Multiplanar, multiecho pulse sequences of the brain and surrounding structures were obtained without intravenous contrast. COMPARISON:  2019 FINDINGS: Brain: There is no acute infarction or intracranial hemorrhage. There is no intracranial mass, mass effect, or edema. There is no hydrocephalus or extra-axial fluid collection. Patchy and confluent areas of T2 hyperintensity in the supratentorial white matter are nonspecific but may reflect moderate chronic microvascular ischemic changes. Prominence of the ventricles and sulci reflects minor generalized parenchymal volume loss Vascular: Major vessel flow voids at the skull base are preserved. Skull and upper cervical spine: Normal marrow signal is preserved. Sinuses/Orbits: Paranasal sinuses are aerated. Bilateral lens replacements. Other: Sella is unremarkable.  Mastoid air cells are clear. IMPRESSION: No evidence of recent infarction, hemorrhage, or mass. Moderate chronic microvascular ischemic changes. Electronically Signed   By: Macy Mis M.D.   On: 04/18/2020 14:39   Portable chest 1 View  Result Date: 04/18/2020 CLINICAL DATA:  Unresponsive EXAM: PORTABLE CHEST  1 VIEW COMPARISON:  June 24, 2018 FINDINGS: There is slight atelectatic change in the left base. The lungs elsewhere are clear. The heart is upper normal in size with pulmonary vascularity normal. There is aortic atherosclerosis. No pneumothorax. There is degenerative change in the thoracic spine. IMPRESSION: Slight left base atelectasis. Lungs elsewhere clear. Heart upper normal in size. Electronically Signed   By: Lowella Grip III M.D.   On: 04/18/2020 17:38        Scheduled Meds:  apixaban  5 mg Oral BID   chlordiazePOXIDE  25 mg Oral TID   Followed by   Derrill Memo ON 04/21/2020] chlordiazePOXIDE  25 mg Oral BH-qamhs   Followed by   Derrill Memo ON 04/22/2020] chlordiazePOXIDE  25 mg Oral Daily   cholecalciferol  1,000 Units Oral Daily   dofetilide  500 mcg Oral BID   levETIRAcetam  500 mg Oral BID   magnesium oxide  400 mg Oral Daily   metoprolol tartrate  100 mg Oral BID   multivitamin with minerals  1 tablet Oral Daily   pantoprazole  40 mg Oral Q0600   sodium chloride flush  3 mL Intravenous Q12H   thiamine  100 mg Oral Daily   vitamin B-12  1,000 mcg Oral Daily   Continuous Infusions:  cefTRIAXone (ROCEPHIN)  IV 1 g (04/19/20 1420)     LOS: 1 day    Time spent: 35 minutes    Irine Seal, MD [facility]   To contact the attending provider between 7A-7P or the covering provider during after hours 7P-7A, please log into the web site [email or web address] and access using universal [place] password for that web site. If you do not have the password, please call the hospital operator.  04/20/2020, 9:53 AM

## 2020-04-20 NOTE — Plan of Care (Signed)
  Problem: Education: Goal: Knowledge of General Education information will improve Description: Including pain rating scale, medication(s)/side effects and non-pharmacologic comfort measures Outcome: Progressing   Problem: Safety: Goal: Ability to remain free from injury will improve Outcome: Progressing   

## 2020-04-20 NOTE — Discharge Instructions (Signed)

## 2020-04-20 NOTE — Progress Notes (Signed)
Physical Therapy Treatment Patient Details Name: Sergio Mack MRN: [id number] DOB: July 15, 1946 Today's Date: 04/20/2020    History of Present Illness Sergio Mack is a 74 y.o. male with medical history significant of atrial fibrillation on chronic anticoagulation with Eliquis, history of alcohol abuse, gastroesophageal reflux disease, hyperlipidemia, who presents to the ED with worsening confusion for the past 1 to 2 days, decreased oral intake, generalized weakness, thick/slurred speech.  fell off his bed on the day of admission, with generalized weakness and patient also noted to have some dysarthric/slurred speech; According to wife, pt was trying to alcohol detx with supervision of PCP, starting a few days prior to admission    PT Comments    Continuing work on functional mobility and activity tolerance;  Session focused on progressive ambulation and continued assessment of cognition; Pt with decr balance, and is dependent on UE support form rW for safety with amb; Pt continues to present with decreased cognition, awareness, and balance. Pt with tangential thought/conversation, bumping into multiple objects (left and right side), and requiring increased cues throughout session; Please also see OT note re: discussion with pt's wife  Follow Up Recommendations  SNF(possible that he will decline SNF; consider [facility])     Equipment Recommendations  Rolling walker with 5" wheels;3in1 (PT)    Recommendations for Other Services       Precautions / Restrictions Precautions Precautions: Fall Restrictions Weight Bearing Restrictions: No    Mobility  Bed Mobility Overal bed mobility: Needs Assistance Bed Mobility: Supine to Sit     Supine to sit: Min guard;HOB elevated     General bed mobility comments: increased time and effort  Transfers Overall transfer level: Needs assistance Equipment used: Rolling walker (2 wheeled) Transfers: Sit to/from Stand Sit to Stand:  Min guard         General transfer comment: Min Guard A for safety  Ambulation/Gait Ambulation/Gait assistance: Min guard;Min Web designer (Feet): (Hallway ambulation) Assistive device: Rolling walker (2 wheeled) Gait Pattern/deviations: Step-through pattern;Decreased step length - right;Decreased step length - left;Drifts right/left     General Gait Details: Much improved step length and weight shift fully onto stance leg; Noteworthy that he would run into object on R and L sides; poor anticipatory awareness   Stairs             Wheelchair Mobility    Modified Rankin (Stroke Patients Only)       Balance Overall balance assessment: Needs assistance Sitting-balance support: No upper extremity supported;Feet supported Sitting balance-Leahy Scale: Fair     Standing balance support: Single extremity supported;During functional activity Standing balance-Leahy Scale: Fair Standing balance comment: required at least single UE support for stability;able to static stand without UE support for short duration;decreased activity tolerance, after standing at sink level for about 5 min noticed slight fasciculations                             Cognition Arousal/Alertness: Awake/alert Behavior During Therapy: Flat affect Overall Cognitive Status: Impaired/Different from baseline Area of Impairment: Safety/judgement;Awareness;Problem solving;Attention                   Current Attention Level: Selective     Safety/Judgement: Decreased awareness of deficits;Decreased awareness of safety Awareness: Emergent Problem Solving: Slow processing;Requires verbal cues General Comments: Per conversation with wife over the phone, pt seems "out of it" and "not himself." [facility] reports that during her visits  and phone conversation, he is not at baseline cognition. During session, pt with tangietal conversation and requiring increased cues throughout. Pt also bumping  into several objects with RW (on L and R) demonstrating poor anticipetory awareness of problems.       Exercises      General Comments General comments (skin integrity, edema, etc.): Per OT: Pt's wife reporting she does not feel she can meet the pt's needs with his current cognitive and physical state.      Pertinent Vitals/Pain Pain Assessment: No/denies pain    Home Living Family/patient expects to be discharged to:: Private residence Living Arrangements: Spouse/significant other Available Help at Discharge: Family;Available 24 hours/day Type of Home: House Home Access: Level entry   Home Layout: One level Home Equipment: Walker - 2 wheels;Shower seat      Prior Function Level of Independence: Independent with assistive device(s)          PT Goals (current goals can now be found in the care plan section) Acute Rehab PT Goals Patient Stated Goal: to go home PT Goal Formulation: With patient Time For Goal Achievement: 05/03/20 Potential to Achieve Goals: Good Progress towards PT goals: Progressing toward goals    Frequency    Min 3X/week      PT Plan Current plan remains appropriate    Co-evaluation PT/OT/SLP Co-Evaluation/Treatment: Yes Reason for Co-Treatment: For patient/therapist safety;To address functional/ADL transfers PT goals addressed during session: Mobility/safety with mobility OT goals addressed during session: ADL's and self-care      AM-PAC PT "6 Clicks" Mobility   Outcome Measure  Help needed turning from your back to your side while in a flat bed without using bedrails?: None Help needed moving from lying on your back to sitting on the side of a flat bed without using bedrails?: None Help needed moving to and from a bed to a chair (including a wheelchair)?: None Help needed standing up from a chair using your arms (e.g., wheelchair or bedside chair)?: A Little Help needed to walk in hospital room?: A Little Help needed climbing 3-5 steps  with a railing? : A Lot 6 Click Score: 20    End of Session Equipment Utilized During Treatment: Gait belt Activity Tolerance: Patient tolerated treatment well Patient left: in chair;with call bell/phone within reach;with chair alarm set Nurse Communication: Mobility status PT Visit Diagnosis: Unsteadiness on feet (R26.81);Other abnormalities of gait and mobility (R26.89);Muscle weakness (generalized) (M62.81)     Time: [id number] PT Time Calculation (min) (ACUTE ONLY): 45 min  Charges:  $Gait Training: 8-22 mins                     Roney Marion, [place]  Acute Rehabilitation Services Pager [phone] Office [phone]    Colletta [place] 04/20/2020, 5:53 PM

## 2020-04-21 LAB — BASIC METABOLIC PANEL
Anion gap: 7 (ref 5–15)
BUN: 7 mg/dL — ABNORMAL LOW (ref 8–23)
CO2: 27 mmol/L (ref 22–32)
Calcium: 8.7 mg/dL — ABNORMAL LOW (ref 8.9–10.3)
Chloride: 103 mmol/L (ref 98–111)
Creatinine, Ser: 0.93 mg/dL (ref 0.61–1.24)
GFR calc Af Amer: >60 mL/min (ref >60)
GFR calc non Af Amer: >60 mL/min (ref >60)
Glucose, Bld: 109 mg/dL — ABNORMAL HIGH (ref 70–99)
Potassium: 4 mmol/L (ref 3.5–5.1)
Sodium: 137 mmol/L (ref 135–145)

## 2020-04-21 LAB — MAGNESIUM: Magnesium: 1.7 mg/dL (ref 1.7–2.4)

## 2020-04-21 MED ORDER — LISINOPRIL 5 MG PO TABS
2.5000 mg | ORAL_TABLET | Freq: Every day | ORAL | Status: DC
  Administered 2020-04-21 – 2020-04-23 (×3): 2.5 mg via ORAL
  Filled 2020-04-21 (×3): qty 1

## 2020-04-21 MED ORDER — MAGNESIUM OXIDE 400 MG PO TABS: 400.0000 mg | ORAL_TABLET | Freq: Every day | ORAL | Status: DC

## 2020-04-21 MED ORDER — LORAZEPAM 1 MG PO TABS: 1.0000 mg | ORAL_TABLET | ORAL | Status: DC | PRN

## 2020-04-21 MED ORDER — FOLIC ACID 1 MG PO TABS
1.0000 mg | ORAL_TABLET | Freq: Every day | ORAL | Status: DC
  Administered 2020-04-21 – 2020-04-23 (×3): 1 mg via ORAL
  Filled 2020-04-21 (×3): qty 1

## 2020-04-21 MED ORDER — ZOLPIDEM TARTRATE 5 MG PO TABS
5.0000 mg | ORAL_TABLET | Freq: Every evening | ORAL | Status: DC | PRN
  Administered 2020-04-21 – 2020-04-22 (×2): 5 mg via ORAL
  Filled 2020-04-21 (×2): qty 1

## 2020-04-21 MED ORDER — LORAZEPAM 2 MG/ML IJ SOLN
1.0000 mg | INTRAMUSCULAR | Status: DC | PRN
  Administered 2020-04-23: 1 mg via INTRAVENOUS
  Filled 2020-04-21: qty 1

## 2020-04-21 MED ORDER — THIAMINE HCL 100 MG/ML IJ SOLN
500.0000 mg | Freq: Every day | INTRAVENOUS | Status: AC
  Administered 2020-04-21 – 2020-04-23 (×3): 500 mg via INTRAVENOUS
  Filled 2020-04-21 (×3): qty 5

## 2020-04-21 MED ORDER — CHLORDIAZEPOXIDE HCL 25 MG PO CAPS: 25.0000 mg | ORAL_CAPSULE | Freq: Four times a day (QID) | ORAL | Status: DC | PRN

## 2020-04-21 MED ORDER — FUROSEMIDE 20 MG PO TABS
20.0000 mg | ORAL_TABLET | Freq: Every day | ORAL | Status: DC
  Administered 2020-04-21 – 2020-04-23 (×3): 20 mg via ORAL
  Filled 2020-04-21 (×3): qty 1

## 2020-04-21 MED ORDER — MAGNESIUM SULFATE 4 GM/100ML IV SOLN
4.0000 g | Freq: Once | INTRAVENOUS | Status: AC
  Administered 2020-04-21: 4 g via INTRAVENOUS
  Filled 2020-04-21: qty 100

## 2020-04-21 MED ORDER — THIAMINE HCL 100 MG PO TABS: 100.0000 mg | ORAL_TABLET | Freq: Every day | ORAL | Status: DC

## 2020-04-21 NOTE — Plan of Care (Signed)

## 2020-04-21 NOTE — Progress Notes (Signed)
PROGRESS NOTE    Sergio Mack  [id number] DOB: 1946/08/21 DOA: 04/18/2020 PCP: Binnie Rail, MD    Chief Complaint  Patient presents with   Weakness    Brief Narrative:  Patient 74 year old gentleman history of A. fib on chronic anticoagulation with Eliquis, history of alcohol abuse, GERD, hyperlipidemia presented to the ED with worsening confusion x2 days, decreased oral intake, weakness.  Patient admitted and being treated for alcohol withdrawal and UTI.   Assessment & Plan:   Principal Problem:   Acute metabolic encephalopathy Active Problems:   Acute lower UTI   Essential hypertension   GERD   Memory difficulties   HFrEF (heart failure with reduced ejection fraction) (HCC)   Persistent atrial fibrillation (HCC)   History of alcohol abuse   Iron deficiency anemia   Alcohol withdrawal (HCC)   Generalized weakness   Dehydration  1 acute metabolic encephalopathy Multifactorial secondary to acute UTI and alcohol withdrawal syndrome.  Patient per wife noted to have recently been started on a Librium detox protocol 2 days prior to admission.  Patient's last drink was 2 days prior to admission when he had gone on a 1 week alcohol binge.  Urinalysis done on admission concerning for UTI.  Urine culture with Staphylococcus epidermidis.  CT head negative.  MRI negative.  Patient afebrile.  Chest x-ray negative for any acute abnormalities.  Ammonia levels within normal limits.  Patient improving clinically.  IV fluids have been saline locked.  Status post 3 days IV Rocephin.  Continue Librium detox protocol, Ativan withdrawal protocol.  Place on thiamine 500 mg IV daily x3 days then back to thiamine 100 mg daily.  Supportive care.  2.  UTI Urine cultures with staph epidermidis.  Status post 3 days IV Rocephin.  No further antibiotics needed.   3.  Gastroesophageal reflux disease Continue PPI.  4.  Alcohol withdrawal/alcohol abuse Patient presented with confusion and  generalized weakness.  Patient per wife had gone on a 1 week alcohol binge.  Last alcohol use was 2 days prior to admission.  Per wife patient with prior history of alcoholism and patient noted to have been sober for approximately 35 years and subsequently resumed drinking 2 years ago intermittently.  Per wife patient had been started on Librium taper per PCP for alcohol detox.  Patient currently on the Librium detox protocol as well as the Ativan withdrawal protocol.  Continue folic acid, multivitamin.  Placed on thiamine 500 mg IV daily x3 days then back to thiamine 100 mg daily.  Supportive care.  SLP for cognitive evaluation.  Per wife patient has signed up for an intensive outpatient alcohol detox program which patient will go to post discharge.  5.  Dehydration Improved with hydration.  IV fluids have been saline locked.    6.  Generalized weakness Likely secondary to UTI and alcohol withdrawal.  Improving clinically.  PT/OT.  PT recommending SNF however patient really wants to go home and if insistent on going home may need to order home health.  SNF discussed with patient again and asked patient to strongly consider SNF placement and to discuss with wife at his noted per OT that wife really wanted patient to go to SNF as she feels may be unable to manage patient at home.  7.  Persistent atrial fibrillation Currently in normal sinus rhythm.  Continue home regimen Tikosyn and metoprolol for rate control.  Eliquis for anticoagulation.  8.  Leukocytosis Likely secondary to UTI versus reactive.  Urine  cultures with staph epidermidis.  Chest x-ray negative for any acute infiltrate.  Status post 3 days IV Rocephin.  Afebrile.  No further antibiotics needed at this time.   9.  Hypokalemia/Hypophosphatemia Potassium at 4.  Patient on Tikosyn and as such we will try to keep potassium close to 4.  Magnesium at 1.7.  Magnesium 4 g IV x1.  Phosphorus repleted and at 3.9.  Follow.      DVT prophylaxis:  Eliquis Code Status: DNR Family Communication: Updated patient.  No family at bedside. Disposition:   Status is: Observation    Dispo: The patient is from: Home              Anticipated d/c is to: To be determined.  Likely home with home health versus SNF              Anticipated d/c date is: 04/21/2020 or 04/22/2020              Patient currently on Librium and Ativan withdrawal protocol, IV antibiotics.       Consultants:   None  Procedures:   MRI brain 04/18/2020  CT head 04/18/2020  Chest x-ray 04/18/2020  Antimicrobials:   IV Rocephin 04/18/2020>>>>>> 04/20/2020   Subjective: Patient using walker coming out of bathroom after having a BM this morning per RN.  Patient denies any chest pain.  No shortness of breath.  Tolerating current diet.   Objective: Vitals:   04/20/20 0835 04/20/20 1706 04/20/20 2104 04/21/20 0444  BP: 137/65 (!) 143/79 132/79 137/70  Pulse: 79 83 85 79  Resp: 20 18 18 18   Temp: 98 F (36.7 C) 98.1 F (36.7 C) 99.2 F (37.3 C) 98.7 F (37.1 C)  TempSrc: Oral Oral Oral Oral  SpO2: 99% 99% 98% 97%  Weight:      Height:        Intake/Output Summary (Last 24 hours) at 04/21/2020 0946 Last data filed at 04/21/2020 0831 Gross per 24 hour  Intake 700 ml  Output 1800 ml  Net -1100 ml   Filed Weights   04/18/20 1036 04/18/20 2149  Weight: 88 kg 86.2 kg    Examination:  General exam: NAD Respiratory system: Lungs clear to auscultation bilaterally.  No wheezes, no crackles, no rhonchi.  Normal respiratory effort.   Cardiovascular system: RRR no murmurs rubs or gallops.  No JVD.  No lower extremity edema.  Gastrointestinal system: Abdomen is soft, obese, nontender, positive bowel sounds, nondistended, no rebound, no guarding.   Central nervous system: Alert and oriented. No focal neurological deficits. Extremities: Symmetric 5 x 5 power. Skin: No rashes, lesions or ulcers Psychiatry: Judgement and insight appear normal. Mood & affect  appropriate.     Data Reviewed: I have personally reviewed following labs and imaging studies  CBC: Recent Labs  Lab 04/15/20 1101 04/18/20 1024 04/19/20 0644 04/20/20 0315  WBC 12.1* 10.8* 8.6 9.0  NEUTROABS  --   --   --  6.0  HGB 15.1 14.0 12.7* 12.3*  HCT 46.1 42.1 38.7* 37.7*  MCV 89.3 87.0 89.0 89.1  PLT 224 140* 121* 143*    Basic Metabolic Panel: Recent Labs  Lab 04/15/20 1101 04/18/20 1024 04/18/20 2158 04/19/20 0644 04/20/20 0315 04/20/20 0954 04/21/20 0312  NA 125* 135  --  136 137  --  137  K 5.0 4.1  --  3.1* 3.6  --  4.0  CL 91* 98  --  104 103  --  103  CO2 17*  23  --  25 24  --  27  GLUCOSE 86 145*  --  128* 129*  --  109*  BUN 14 22  --  11 7*  --  7*  CREATININE 0.91 1.13  --  0.87 0.95  --  0.93  CALCIUM 8.0* 9.0  --  8.3* 8.4*  --  8.7*  MG  --   --  1.8  --   --  1.7 1.7  PHOS  --   --  2.0*  --  3.9  --   --     GFR: Estimated Creatinine Clearance: 78.8 mL/min (by C-G formula based on SCr of 0.93 mg/dL).  Liver Function Tests: Recent Labs  Lab 04/15/20 1101 04/18/20 1024 04/19/20 0644 04/20/20 0315  AST 55* 26 21  --   ALT 26 17 15   --   ALKPHOS 73 61 59  --   BILITOT 1.5* 1.6* 0.8  --   PROT 6.5 6.7 6.1*  --   ALBUMIN 3.6 3.6 3.0* 2.7*    CBG: Recent Labs  Lab 04/18/20 1020  GLUCAP 143*     Recent Results (from the past 240 hour(s))  Urine Culture     Status: Abnormal   Collection Time: 04/18/20 12:21 PM   Specimen: Urine, Random  Result Value Ref Range Status   Specimen Description URINE, RANDOM  Final   Special Requests   Final    NONE Performed at [facility], [place] [address] [place], [place] [address]    Culture >=100,000 COLONIES/mL STAPHYLOCOCCUS EPIDERMIDIS (A)  Final   Report Status 04/20/2020 FINAL  Final   Organism ID, Bacteria STAPHYLOCOCCUS EPIDERMIDIS (A)  Final      Susceptibility   Staphylococcus epidermidis - MIC*    CIPROFLOXACIN <=0.5 SENSITIVE Sensitive     GENTAMICIN <=0.5 SENSITIVE  Sensitive     NITROFURANTOIN <=16 SENSITIVE Sensitive     OXACILLIN <=0.25 SENSITIVE Sensitive     TETRACYCLINE 2 SENSITIVE Sensitive     VANCOMYCIN 1 SENSITIVE Sensitive     TRIMETH/SULFA >=320 RESISTANT Resistant     CLINDAMYCIN <=0.25 SENSITIVE Sensitive     RIFAMPIN <=0.5 SENSITIVE Sensitive     Inducible Clindamycin NEGATIVE Sensitive     * >=100,000 COLONIES/mL STAPHYLOCOCCUS EPIDERMIDIS  SARS Coronavirus 2 by RT PCR (hospital order, performed in [facility] hospital lab) Nasopharyngeal Nasopharyngeal Swab     Status: None   Collection Time: 04/18/20  4:27 PM   Specimen: Nasopharyngeal Swab  Result Value Ref Range Status   SARS Coronavirus 2 NEGATIVE NEGATIVE Final    Comment: (NOTE) SARS-CoV-2 target nucleic acids are NOT DETECTED. The SARS-CoV-2 RNA is generally detectable in upper and lower respiratory specimens during the acute phase of infection. The lowest concentration of SARS-CoV-2 viral copies this assay can detect is 250 copies / mL. A negative result does not preclude SARS-CoV-2 infection and should not be used as the sole basis for treatment or other patient management decisions.  A negative result may occur with improper specimen collection / handling, submission of specimen other than nasopharyngeal swab, presence of viral mutation(s) within the areas targeted by this assay, and inadequate number of viral copies (<250 copies / mL). A negative result must be combined with clinical observations, patient history, and epidemiological information. Fact Sheet for Patients:   StrictlyIdeas.no Fact Sheet for Healthcare Providers: BankingDealers.co.za This test is not yet approved or cleared  by the [place] FDA and has been authorized for detection and/or  diagnosis of SARS-CoV-2 by FDA under an Emergency Use Authorization (EUA).  This EUA will remain in effect (meaning this test can be used) for the duration of  the COVID-19 declaration under Section 564(b)(1) of the Act, 21 U.S.C. section 360bbb-3(b)(1), unless the authorization is terminated or revoked sooner. Performed at [facility], [place] [address], [place], [place] [address]          Radiology Studies: No results found.      Scheduled Meds:  apixaban  5 mg Oral BID   chlordiazePOXIDE  25 mg Oral BH-qamhs   Followed by   Derrill Memo ON 04/22/2020] chlordiazePOXIDE  25 mg Oral Daily   cholecalciferol  1,000 Units Oral Daily   dofetilide  500 mcg Oral BID   furosemide  20 mg Oral Daily   levETIRAcetam  500 mg Oral BID   lisinopril  2.5 mg Oral Daily   magnesium oxide  400 mg Oral Daily   metoprolol tartrate  100 mg Oral BID   multivitamin with minerals  1 tablet Oral Daily   pantoprazole  40 mg Oral Q0600   sodium chloride flush  3 mL Intravenous Q12H   thiamine  100 mg Oral Daily   vitamin B-12  1,000 mcg Oral Daily   Continuous Infusions:    LOS: 2 days    Time spent: 35 minutes    Irine Seal, MD [facility]   To contact the attending provider between 7A-7P or the covering provider during after hours 7P-7A, please log into the web site [email or web address] and access using universal [place] password for that web site. If you do not have the password, please call the hospital operator.  04/21/2020, 9:46 AM

## 2020-04-21 NOTE — Progress Notes (Addendum)
Physical Therapy Note   04/21/20 1200  Standardized Balance Assessment  Standardized Balance Assessment  Berg Balance Test  Berg Balance Test  Sit to Stand 1  Standing Unsupported 2  Sitting with Back Unsupported but Feet Supported on Floor or Stool 4  Stand to Sit 2  Transfers 2  Standing Unsupported with Eyes Closed 3  Standing Ubsupported with Feet Together 1  From Standing, Reach Forward with Outstretched Arm 3  From Standing Position, Pick up Object from Floor 3  From Standing Position, Turn to Look Behind Over each Shoulder 2  Turn 360 Degrees 0  Standing Unsupported, Alternately Place Feet on Step/Stool 0  Standing Unsupported, One Foot in Front 1  Standing on One Leg 1  Total Score 25      Sergio Mack' Berg Balance assessment score of 25/56 is indicative of high fall risk -- near 100% chance of him falling within the next 12 months; Continue to recommend SNF for post-acute rehab; Full PT treatment note to follow;   Sergio Mack, [place] Pager [phone] Office [phone]

## 2020-04-21 NOTE — TOC Initial Note (Signed)
Transition of Care [facility]) - Initial/Assessment Note    Patient Details  Name: Sergio Mack MRN: [id number] Date of Birth: 1946/07/05  Transition of Care [facility]) CM/SW Contact:    Sable Feil, LCSW Phone Number: 04/21/2020, 3:20 PM  Clinical Narrative:  CSW and nurse case manager visited room and made introductions and reason for visit. [name] was sitting up in the chair at bedside and was beginning to eat lunch. Wife agreeable to talking with CSW and RN case mgr. and requested to talk outside of room. [name] did not verbalize disagreement.   [name] informed CSW that she and her husband have been vaccinated. She provided CSW and nurse case manager with a history of her husband's alcohol use. [name] reported her husband has been sober for 32 years and recently started back drinking again, having detoxed at home 3 times since December 2020. Wife reported that he fell at home Friday morning and she could not assist him and called his doctor who recommended that he be taken to the hospital. Mrs. Hiles added that cognitively her husband is not the same person he was a week ago. Per wife, her husband has never been to ETOH treatment before, but did go to [facility] many years ago for about 2 years. She added that he was getting ready to sign up at [facility] before coming to hospital.   Once back at bedside, [place] talked with [name] about ST rehab at a skilled nursing facility and explained the process and the value of ST rehab in helping him get stronger. Talked with he and wife about the SNF search process, and provided patient/wife with Medicare.gov SNF list.  Wife expressed [facility] as a preference and facility was contacted and admissions director Levada Dy indicated that they do have bed availability and she would review patient's information once sent.               Expected Discharge Plan: [place] Barriers to Discharge:  Continued Medical Work up   Patient Goals and CMS Choice Patient states their goals for this hospitalization and ongoing recovery are:: Patient expressed agreement with ST rehab CMS Medicare.gov Compare Post Acute Care list provided to:: Patient Represenative (must comment)(Wife provided with Medicare.gov SNF list) Choice offered to / list presented to : Patient, Spouse  Expected Discharge Plan and Services Expected Discharge Plan: [place] arrangements for the past 2 months: Single Family Home                                      Prior Living Arrangements/Services Living arrangements for the past 2 months: Single Family Home Lives with:: Spouse Patient language and need for interpreter reviewed:: No Do you feel safe going back to the place where you live?: No   Patient agreeable to [facility] rehab.  Need for Family Participation in Patient Care: Yes (Comment) Care giver support system in place?: Yes (comment)(Wife assists patient at home)   Criminal Activity/Legal Involvement Pertinent to Current Situation/Hospitalization: No - Comment as needed  Activities of Daily Living Home Assistive Devices/Equipment: Cane (specify quad or straight), Walker (specify type) ADL Screening (condition at time of admission) Patient's cognitive ability adequate to safely complete daily activities?: Yes Is the patient deaf or have difficulty hearing?: No Does the patient have difficulty seeing, even when wearing glasses/contacts?:  No Does the patient have difficulty concentrating, remembering, or making decisions?: No Patient able to express need for assistance with ADLs?: No Does the patient have difficulty dressing or bathing?: No Independently performs ADLs?: Yes (appropriate for developmental age) Does the patient have difficulty walking or climbing stairs?: Yes Weakness of Legs: Both Weakness of Arms/Hands: None  Permission Sought/Granted Permission sought  to share information with : Family Supports(Patient allowed CSW and nurse case manager to speak with wife outside of the room while he ate lunch) Permission granted to share information with : (Mr. Blaich did not express verbal agreement, but allowed CSW and  nurse case manager to speak with his wife outside of the room)              Emotional Assessment Appearance:: Appears stated age Attitude/Demeanor/Rapport: (Appropriate) Affect (typically observed): Appropriate Orientation: : Oriented to Self, Oriented to Place, Oriented to  Time, Oriented to Situation Alcohol / Substance Use: Tobacco Use, Alcohol Use, Illicit Drugs(Per H&P patient quit smoking and using chew tobacco and reported a history of alcohol use. Per wife, patient has current alcohol use) Psych Involvement: No (comment)  Admission diagnosis:  Chronic alcoholism ([place]) [F10.20] Confusion [R41.0] Malaise [R53.81] Acute UTI [N39.0] Generalized weakness [id number] Acute metabolic encephalopathy [id number] AMS (altered mental status) [R41.82] Patient Active Problem List   Diagnosis Date Noted   Acute metabolic encephalopathy [id number]   Acute lower UTI 04/18/2020   Alcohol withdrawal ([place]) 04/18/2020   Generalized weakness 04/18/2020   Dehydration 04/18/2020   Chronic alcoholism ([place])    Iron deficiency anemia 11/25/2019   Difficulty sleeping 11/21/2019   Hypomagnesemia 05/15/2019   Alcoholism ([place]) 05/14/2019   Cough 10/31/2018   Hyponatremia 10/30/2018   Alcohol withdrawal seizure ([place]) 06/26/2018   History of alcohol abuse 06/25/2018   Leukocytosis 06/25/2018   Persistent atrial fibrillation ([place]) 12/19/2017   HFrEF (heart failure with reduced ejection fraction) ([place]) 12/04/2017   Memory difficulties 10/26/2017   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   Sensorineural hearing loss (SNHL), 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   Prediabetes 04/07/2016   Full thickness rotator cuff tear 04/23/2014    Anticoagulation adequate, new on eliquis 03/07/2014   SKIN CANCER, HX OF 03/24/2010   DIVERTICULOSIS, COLON 12/11/2008   SPINAL STENOSIS 01/14/2008   BACK PAIN, LUMBAR, WITH RADICULOPATHY 01/02/2008   Essential hypertension 10/31/2007   HYPERPLASIA PROSTATE UNS W/O UR OBST & OTH LUTS 10/31/2007   GERD 09/27/2007   PCP:  Binnie Rail, MD Pharmacy:   [facility], [place] [address] [place] [address] Phone: [phone] Fax: [facility] [place], [address] [place] [place] [address] Phone: [phone] Fax: [phone]     Social Determinants of Health ([place]) Interventions  Patient understands that he needs assistance with drinking cessation and wants and intends to get help once he leaves ST rehab.   Readmission Risk Interventions No flowsheet data found.

## 2020-04-21 NOTE — NC FL2 (Signed)
[place] LEVEL OF CARE SCREENING TOOL     IDENTIFICATION  Patient Name: Sergio Mack Birthdate: 1946-02-04 Sex: male Admission Date (Current Location): 04/18/2020  [facility] and [place] Number:  Herbalist and Address:         Provider Number: [id number]  Attending Physician Name and Address:  Eugenie Filler, MD  Relative Name and Phone Number:  Kaeleb Purnell - wife; [phone]    Current Level of Care: Hospital Recommended Level of Care: [place] Prior Approval Number:    Date Approved/Denied:   PASRR Number: [id number]  Discharge Plan: SNF    Current Diagnoses: Patient Active Problem List   Diagnosis Date Noted  . Acute metabolic encephalopathy [id number]  . Acute lower UTI 04/18/2020  . Alcohol withdrawal ([place]) 04/18/2020  . Generalized weakness 04/18/2020  . Dehydration 04/18/2020  . Chronic alcoholism ([place])   . Iron deficiency anemia 11/25/2019  . Difficulty sleeping 11/21/2019  . Hypomagnesemia 05/15/2019  . Alcoholism ([place]) 05/14/2019  . Cough 10/31/2018  . Hyponatremia 10/30/2018  . Alcohol withdrawal seizure ([place]) 06/26/2018  . History of alcohol abuse 06/25/2018  . Leukocytosis 06/25/2018  . Persistent atrial fibrillation ([place]) 12/19/2017  . HFrEF (heart failure with reduced ejection fraction) ([place]) 12/04/2017  . Memory difficulties 10/26/2017  . 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 Sensorineural hearing loss (SNHL), 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  . Prediabetes 04/07/2016  . Full thickness rotator cuff tear 04/23/2014  . Anticoagulation adequate, new on eliquis 03/07/2014  . SKIN CANCER, HX OF 03/24/2010  . DIVERTICULOSIS, COLON 12/11/2008  . SPINAL STENOSIS 01/14/2008  . BACK PAIN, LUMBAR, WITH RADICULOPATHY 01/02/2008  . Essential hypertension 10/31/2007  . 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 GERD 09/27/2007    Orientation RESPIRATION BLADDER Height & Weight      Self, Time, Situation, Place  Normal Continent Weight: 190 lb 0.6 oz (86.2 kg) Height:  6\' 1"  (185.4 cm)  BEHAVIORAL SYMPTOMS/MOOD NEUROLOGICAL BOWEL NUTRITION STATUS      Continent Diet(Heart healthy diet)  AMBULATORY STATUS COMMUNICATION OF NEEDS Skin   Limited Assist Verbally Other (Comment)(Ecchymosis right/left arm)                       Personal Care Assistance Level of Assistance  Bathing, Feeding, Dressing Bathing Assistance: Limited assistance(Min guard assistance) Feeding assistance: Limited assistance(Assistance with set-up) Dressing Assistance: Limited assistance(Min guard assistance)     Functional Limitations Info  Sight, Hearing, Speech Sight Info: Impaired(Patient wears reading glasses) Hearing Info: Impaired(Patient wears hearing aides) Speech Info: Adequate    SPECIAL CARE FACTORS FREQUENCY  PT (By licensed PT), OT (By licensed OT), Speech therapy     PT Frequency: Evaluation 5/30. PT at [facility] Eval and Treat, a minimum of 5 days per week OT Frequency: Evaluation 5/30. OT at [facility] Eval and Treat, a minimum of 5 days per week     Speech Therapy Frequency: Evaluation 5/30      Contractures Contractures Info: Not present    Additional Factors Info  Code Status, Allergies Code Status Info: DNR Allergies Info: Glucosamine, Shellfish           Current Medications (04/21/2020):  This is the current hospital active medication list Current Facility-Administered Medications  Medication Dose Route Frequency Provider Last Rate Last Admin  . acetaminophen (TYLENOL) tablet 650 mg  650 mg Oral Q6H PRN Eugenie Filler, MD   650 mg at 04/18/20 1828   Or  .  acetaminophen (TYLENOL) suppository 650 mg  650 mg Rectal Q6H PRN Eugenie Filler, MD      . apixaban Arne [place]) tablet 5 mg  5 mg Oral BID Eugenie Filler, MD   5 mg at 04/21/20 [id number]  . chlordiazePOXIDE (LIBRIUM) capsule 25 mg  25 mg Oral BH-qamhs Eugenie Filler, MD   25 mg at 04/21/20 [id number]    Followed by  . [START ON 04/22/2020] chlordiazePOXIDE (LIBRIUM) capsule 25 mg  25 mg Oral Daily Eugenie Filler, MD      . chlordiazePOXIDE (LIBRIUM) capsule 25 mg  25 mg Oral Q6H PRN Eugenie Filler, MD      . cholecalciferol (VITAMIN D3) tablet 1,000 Units  1,000 Units Oral Daily Eugenie Filler, MD   1,000 Units at 04/21/20 [phone]  . dofetilide (TIKOSYN) capsule 500 mcg  500 mcg Oral BID Rumbarger, Valeda Malm, RPH   500 mcg at 04/21/20 1036  . folic acid (FOLVITE) tablet 1 mg  1 mg Oral Daily Eugenie Filler, MD      . furosemide (LASIX) tablet 20 mg  20 mg Oral Daily Eugenie Filler, MD   20 mg at 04/21/20 [id number]  . hydrOXYzine (ATARAX/VISTARIL) tablet 25 mg  25 mg Oral Q6H PRN Eugenie Filler, MD      . levalbuterol Penne Lash) nebulizer solution 0.63 mg  0.63 mg Nebulization Q6H PRN Eugenie Filler, MD      . levETIRAcetam (KEPPRA) tablet 500 mg  500 mg Oral BID Eugenie Filler, MD   500 mg at 04/21/20 0904  . lisinopril (ZESTRIL) tablet 2.5 mg  2.5 mg Oral Daily Eugenie Filler, MD   2.5 mg at 04/21/20 0906  . loperamide (IMODIUM) capsule 2-4 mg  2-4 mg Oral PRN Eugenie Filler, MD      . LORazepam (ATIVAN) tablet 1-4 mg  1-4 mg Oral Q1H PRN Eugenie Filler, MD       Or  . LORazepam (ATIVAN) injection 1-4 mg  1-4 mg Intravenous Q1H PRN Eugenie Filler, MD      . magnesium citrate solution 1 Bottle  1 Bottle Oral Once PRN Eugenie Filler, MD      . magnesium oxide (MAG-OX) tablet 400 mg  400 mg Oral Daily Eugenie Filler, MD   400 mg at 04/21/20 [id number]  . metoprolol tartrate (LOPRESSOR) tablet 100 mg  100 mg Oral BID Eugenie Filler, MD   100 mg at 04/21/20 [id number]  . multivitamin with minerals tablet 1 tablet  1 tablet Oral Daily Eugenie Filler, MD   1 tablet at 04/21/20 [phone]  . ondansetron (ZOFRAN) tablet 4 mg  4 mg Oral Q6H PRN Eugenie Filler, MD       Or  . ondansetron [facility]) injection 4 mg  4 mg Intravenous Q6H PRN Eugenie Filler, MD      .  ondansetron (ZOFRAN-ODT) disintegrating tablet 4 mg  4 mg Oral Q6H PRN Eugenie Filler, MD      . pantoprazole (PROTONIX) EC tablet 40 mg  40 mg Oral Q0600 Eugenie Filler, MD   40 mg at 04/21/20 0906  . polyethylene glycol (MIRALAX / GLYCOLAX) packet 17 g  17 g Oral Daily PRN Eugenie Filler, MD      . sodium chloride (OCEAN) 0.65 % nasal spray 1 spray  1 spray Each Nare PRN Eugenie Filler, MD      . sodium chloride flush (NS) 0.9 %  injection 3 mL  3 mL Intravenous Q12H Eugenie Filler, MD   3 mL at 04/21/20 1228  . sorbitol 70 % solution 30 mL  30 mL Oral Daily PRN Eugenie Filler, MD      . thiamine 500mg  in normal saline (10ml) IVPB  500 mg Intravenous Daily Eugenie Filler, MD 100 mL/hr at 04/21/20 1233 500 mg at 04/21/20 1233  . [START ON 04/24/2020] thiamine tablet 100 mg  100 mg Oral Daily Eugenie Filler, MD      . vitamin B-12 (CYANOCOBALAMIN) tablet 1,000 mcg  1,000 mcg Oral Daily Eugenie Filler, MD   1,000 mcg at 04/21/20 [id number]  . zolpidem (AMBIEN) tablet 5 mg  5 mg Oral QHS PRN Eugenie Filler, MD         Discharge Medications: Please see discharge summary for a list of discharge medications.  Relevant Imaging Results:  Relevant Lab Results:   Additional Information ss#[id number].  Sable Feil, LCSW

## 2020-04-21 NOTE — Progress Notes (Signed)
Physical Therapy Treatment Patient Details Name: Sergio Mack MRN: [id number] DOB: 06-20-1946 Today's Date: 04/21/2020    History of Present Illness Sergio Mack is a 74 y.o. male with medical history significant of atrial fibrillation on chronic anticoagulation with Eliquis, history of alcohol abuse, gastroesophageal reflux disease, hyperlipidemia, who presents to the ED with worsening confusion for the past 1 to 2 days, decreased oral intake, generalized weakness, thick/slurred speech.  fell off his bed on the day of admission, with generalized weakness and patient also noted to have some dysarthric/slurred speech; According to wife, pt was trying to alcohol detx with supervision of PCP, starting a few days prior to admission    PT Comments    Continuing work on functional mobility and activity tolerance;  Notably slower-moving, almost bradykinetic, today, with slower time to answer questions; session focused on performing more detailed balance assessment; completed the Berg Balance Assessment, and Sergio Mack's score of 25/56 puts him at extremely high risk of falls -- continued the discussion of recommendation of SNF for post-acute rehab to maximize independence and safety with mobility; Discussed with Dr. [place] Silos and Lorriane Shire, LCSW   Follow Up Recommendations  SNF(possible that he will decline SNF; consider [facility])     Equipment Recommendations  Rolling walker with 5" wheels;3in1 (PT)    Recommendations for Other Services       Precautions / Restrictions Precautions Precautions: Fall    Mobility  Bed Mobility Overal bed mobility: Needs Assistance Bed Mobility: Supine to Sit     Supine to sit: HOB elevated;Min assist     General bed mobility comments: Min handheld assist to pull to sit and square off hips at EOB  Transfers Overall transfer level: Needs assistance Equipment used: None;Rolling walker (2 wheeled) Transfers: Sit to/from Stand Sit to Stand: Min  guard         General transfer comment: Min guard with and without physical contact; noted bracing backs of legs against bed for stability, decr control of descent to sit; dependent on UEs and bracing for control  Ambulation/Gait Ambulation/Gait assistance: Min assist Gait Distance (Feet): (in room ambulation) Assistive device: Rolling walker (2 wheeled) Gait Pattern/deviations: Step-through pattern;Decreased step length - right;Decreased step length - left;Drifts right/left Gait velocity: quite slow   General Gait Details: Marked upper trunk flexion (notably moreso than yesterday); some difficulty managing RW around obstacle in room   Stairs             Wheelchair Mobility    Modified Rankin (Stroke Patients Only)       Balance     Sitting balance-Leahy Scale: Fair       Standing balance-Leahy Scale: Poor Standing balance comment: 6/1: Scored 25/56 on Berg Balance Aseesment, indicative of significantly high fall risk; see other PT note of this date                 Standardized Balance Assessment Standardized Balance Assessment : Berg Balance Test Berg Balance Test Sit to Stand: Needs minimal aid to stand or to stabilize Standing Unsupported: Able to stand 30 seconds unsupported Sitting with Back Unsupported but Feet Supported on Floor or Stool: Able to sit safely and securely 2 minutes Stand to Sit: Uses backs of legs against chair to control descent Transfers: Able to transfer with verbal cueing and /or supervision Standing Unsupported with Eyes Closed: Able to stand 10 seconds with supervision Standing Ubsupported with Feet Together: Needs help to attain position but able to stand for 30  seconds with feet together From Standing, Reach Forward with Outstretched Arm: Can reach forward >12 cm safely (5") From Standing Position, Pick up Object from Floor: Able to pick up shoe, needs supervision From Standing Position, Turn to Look Behind Over each Shoulder:  Turn sideways only but maintains balance Turn 360 Degrees: Needs assistance while turning Standing Unsupported, Alternately Place Feet on Step/Stool: Needs assistance to keep from falling or unable to try Standing Unsupported, One Foot in Front: Needs help to step but can hold 15 seconds Standing on One Leg: Tries to lift leg/unable to hold 3 seconds but remains standing independently Total Score: 25        Cognition Arousal/Alertness: Awake/alert(Sleepy, but arousable) Behavior During Therapy: Flat affect Overall Cognitive Status: Impaired/Different from baseline Area of Impairment: Safety/judgement;Awareness;Problem solving;Attention                   Current Attention Level: Selective     Safety/Judgement: Decreased awareness of deficits;Decreased awareness of safety Awareness: Emergent Problem Solving: Slow processing;Requires verbal cues General Comments: Slower moving, less interactive, much longer pause before answering questions; unable to don his hearing aids without assistance      Exercises      General Comments General comments (skin integrity, edema, etc.): Wife present for beginning of session and indicated she is unable to safely care for him at home      Pertinent Vitals/Pain Pain Assessment: No/denies pain    Home Living                      Prior Function            PT Goals (current goals can now be found in the care plan section) Acute Rehab PT Goals Patient Stated Goal: to go home PT Goal Formulation: With patient Time For Goal Achievement: 05/03/20 Potential to Achieve Goals: Good Progress towards PT goals: Not progressing toward goals - comment(Slower moving today)    Frequency    Min 2X/week      PT Plan Current plan remains appropriate;Frequency needs to be updated    Co-evaluation              AM-PAC PT "6 Clicks" Mobility   Outcome Measure  Help needed turning from your back to your side while in a flat  bed without using bedrails?: A Little Help needed moving from lying on your back to sitting on the side of a flat bed without using bedrails?: A Little Help needed moving to and from a bed to a chair (including a wheelchair)?: A Little Help needed standing up from a chair using your arms (e.g., wheelchair or bedside chair)?: A Little Help needed to walk in hospital room?: A Little Help needed climbing 3-5 steps with a railing? : A Lot 6 Click Score: 17    End of Session Equipment Utilized During Treatment: Gait belt Activity Tolerance: Patient tolerated treatment well Patient left: in chair;with call bell/phone within reach;with chair alarm set Nurse Communication: Mobility status PT Visit Diagnosis: Unsteadiness on feet (R26.81);Other abnormalities of gait and mobility (R26.89);Muscle weakness (generalized) (M62.81)     Time: [id number] PT Time Calculation (min) (ACUTE ONLY): 44 min  Charges:  $Gait Training: 8-22 mins $Therapeutic Activity: 23-37 mins                     Roney Marion, PT  Acute Rehabilitation Services Pager [phone] Office [place] 04/21/2020, 1:24 PM

## 2020-04-22 LAB — MAGNESIUM: Magnesium: 2.2 mg/dL (ref 1.7–2.4)

## 2020-04-22 LAB — SARS CORONAVIRUS 2 (TAT 6-24 HRS): SARS Coronavirus 2: NEGATIVE

## 2020-04-22 NOTE — Progress Notes (Signed)
  Speech Language Pathology Treatment: Cognitive-Linquistic  Patient Details Name: Sergio Mack MRN: [id number] DOB: 1946-09-12 Today's Date: 04/22/2020 Time: [id number] SLP Time Calculation (min) (ACUTE ONLY): 19 min  Assessment / Plan / Recommendation Clinical Impression  Pt was seen for cognitive-linguistic treatment and was cooperative throughout the session. He required verbal prompts throughout for focused attention. He achieved 33% accuracy with time management problems increasing to 100% with verbal prompts. He completed simple reasoning tasks with 80% accuracy when verbal prompts were provided. Verbal prompts were needed for accurate sequencing when using the room phone to call his is wife. SLP will continue to follow pt.    HPI HPI: Pt is a 74 y.o. male with medical history significant of atrial fibrillation on chronic anticoagulation with Eliquis, alcohol abuse, GERD, hyperlipidemia, who presented to the ED with worsening confusion for the 1-2 days, decreased oral intake, generalized weakness, and "thick/slurred speech". CXR 5/29: Slight left base atelectasis. Lungs elsewhere clear. MRI brain negative for acute changes.       SLP Plan  Continue with current plan of care  Patient needs continued Speech Lanaguage Pathology Services    Recommendations                   Follow up Recommendations: Skilled Nursing facility SLP Visit Diagnosis: Cognitive communication deficit [id number]) Plan: Continue with current plan of care       Monnie Anspach I. Hardin Negus, [place], [place] Office number [phone] Pager [place] 04/22/2020, 10:11 AM

## 2020-04-22 NOTE — TOC Progression Note (Addendum)
Transition of Care [facility]) - Progression Note    Patient Details  Name: Sergio Mack MRN: [id number] Date of Birth: [birth year]  [facility] CM/SW Contact  Sharlet Salina Mila Homer, LCSW Phone Number: 04/22/2020, 2:34 PM  Clinical Narrative:  Talked with wife regarding facility search and informed her that [facility] declined - no reason given. She was also informed that [facility] accepted.. Mrs. Stonge requested that [facility] be contacted and indicated that this facility is a preference for her.  Clinicals transmitted to [facility] via [employer]. Call made to admissions director and she will review patient's information and get back with CSW.   Talked again with Tammy at [facility] and she can accept patient. She was informed that patient has been vaccinated, however a COVID test is still needed. Wife contacted and was very pleased that [facility] can take her husband. Wife advised that facility will need to see her husband's COVID vaccination card. MD advised by secure chat of facility that is accepting patient and visit made to patient's room and updated him regarding [facility].    Expected Discharge Plan: Skilled Nursing Facility Barriers to Discharge: Continued Medical Work up  Expected Discharge Plan and Services Expected Discharge Plan: [place] arrangements for the past 2 months: Single Family Home                                     Social Determinants of Health (SDOH) Interventions  Patient unable to engage in physical activity and wife reported that patient needs rehab as she cannot help him transfer or ambulate.    Readmission Risk Interventions No flowsheet data found.

## 2020-04-22 NOTE — Progress Notes (Signed)
Occupational Therapy Treatment Patient Details Name: Sergio Mack MRN: [id number] DOB: Oct 10, 1946 Today's Date: 04/22/2020    History of present illness Sergio Mack is a 74 y.o. male with medical history significant of atrial fibrillation on chronic anticoagulation with Eliquis, history of alcohol abuse, gastroesophageal reflux disease, hyperlipidemia, who presents to the ED with worsening confusion for the past 1 to 2 days, decreased oral intake, generalized weakness, thick/slurred speech.  fell off his bed on the day of admission, with generalized weakness and patient also noted to have some dysarthric/slurred speech; According to wife, pt was trying to alcohol detx with supervision of PCP, starting a few days prior to admission   OT comments  Pt making steady progress towards OT goals this session. Pt noted to be attempting to exit bed over siderail upon arrival. Pt with no awareness to safety implications of getting up independently. Overall, pt requires MIN A - min guard for functional transfers to [facility] with RW and total A for posterior pericare. Pt oriented to location but very flat during session communicating minimally with therapist. Continue to recommend SNF for DC, will follow acutely per POC.    Follow Up Recommendations  SNF;Supervision/Assistance - 24 hour    Equipment Recommendations  3 in 1 bedside commode    Recommendations for Other Services      Precautions / Restrictions Precautions Precautions: Fall Restrictions Weight Bearing Restrictions: No       Mobility Bed Mobility   Bed Mobility: Sit to Supine       Sit to supine: Supervision;HOB elevated   General bed mobility comments: pt getting out of the side of the bed upon arrival; supervision to return to supine  Transfers Overall transfer level: Needs assistance Equipment used: Rolling walker (2 wheeled) Transfers: Sit to/from [facility] Sit to Stand: Min guard Stand pivot  transfers: Min assist       General transfer comment: min guard for sit<>stand from EOB and BSC, cues for hand placement. MIN A for stand pivot for RW mgmt and line mgmt    Balance Overall balance assessment: Needs assistance Sitting-balance support: No upper extremity supported;Feet supported Sitting balance-Leahy Scale: Fair     Standing balance support: Single extremity supported;During functional activity Standing balance-Leahy Scale: Poor Standing balance comment: reliant on at least one UE supported during dynamic tasks                           ADL either performed or assessed with clinical judgement   ADL Overall ADL's : Needs assistance/impaired                         Toilet Transfer: Minimal assistance;RW;BSC;Stand-pivot;Cueing for safety Toilet Transfer Details (indicate cue type and reason): min A stand pivot to BSC with RW; cues to reaching back and pushing up from sitting surfce Toileting- Clothing Manipulation and Hygiene: Sit to/from stand;Total assistance Toileting - Clothing Manipulation Details (indicate cue type and reason): assist for posterior care post BM     Functional mobility during ADLs: Minimal assistance;Rolling walker;Cueing for safety General ADL Comments: pt noted to be trying to get OOB upon arrival attempting to get to [facility]. MIN A for safety with RW     Vision       Perception     Praxis      Cognition Arousal/Alertness: Awake/alert Behavior During Therapy: Flat affect Overall Cognitive Status: Impaired/Different from baseline Area of  Impairment: Safety/judgement;Awareness;Problem solving;Attention                   Current Attention Level: Selective     Safety/Judgement: Decreased awareness of deficits;Decreased awareness of safety   Problem Solving: Slow processing;Requires verbal cues General Comments: pt states hes in the hospital but states, "you're just everywhere today" in reference to therapist.  Pt with decreased safety awareness noted to be attempting to exit bed upon arrival with rails still up. Pt very flat during session with selective attention needing cues to orient to session        Exercises     Shoulder Instructions       General Comments noted to have small BM during session; NT aware. getting out of bed upon arrival    Pertinent Vitals/ Pain       Pain Assessment: No/denies pain  Home Living                                          Prior Functioning/Environment              Frequency  Min 2X/week        Progress Toward Goals  OT Goals(current goals can now be found in the care plan section)  Progress towards OT goals: Progressing toward goals  Acute Rehab OT Goals Patient Stated Goal: to go home OT Goal Formulation: With patient Time For Goal Achievement: 05/03/20 Potential to Achieve Goals: Good  Plan Discharge plan remains appropriate    Co-evaluation                 AM-PAC OT "6 Clicks" Daily Activity     Outcome Measure   Help from another person eating meals?: A Little Help from another person taking care of personal grooming?: A Little Help from another person toileting, which includes using toliet, bedpan, or urinal?: A Little Help from another person bathing (including washing, rinsing, drying)?: A Little Help from another person to put on and taking off regular upper body clothing?: A Little Help from another person to put on and taking off regular lower body clothing?: A Little 6 Click Score: 18    End of Session Equipment Utilized During Treatment: Rolling walker;Other (comment)(BSC)  OT Visit Diagnosis: Other abnormalities of gait and mobility (R26.89);Muscle weakness (generalized) (M62.81);Other symptoms and signs involving cognitive function   Activity Tolerance Patient tolerated treatment well   Patient Left in bed;with call bell/phone within reach;with bed alarm set   Nurse Communication  Mobility status        Time: [id number] OT Time Calculation (min): 15 min  Charges: OT General Charges $OT Visit: 1 Visit OT Treatments $Self Care/Home Management : 8-22 mins  Lanier Clam., COTA/L Acute Rehabilitation Services [phone]    Ihor Gully 04/22/2020, 3:58 PM

## 2020-04-22 NOTE — Evaluation (Signed)
Speech Language Pathology Evaluation Patient Details Name: Sergio Mack MRN: [id number] DOB: 1946-07-11 Today's Date: 04/22/2020 Time: [id number] SLP Time Calculation (min) (ACUTE ONLY): 21 min  Problem List:  Patient Active Problem List   Diagnosis Date Noted  . Acute metabolic encephalopathy [id number]  . Acute lower UTI 04/18/2020  . Alcohol withdrawal ([place]) 04/18/2020  . Generalized weakness 04/18/2020  . Dehydration 04/18/2020  . Chronic alcoholism ([place])   . Iron deficiency anemia 11/25/2019  . Difficulty sleeping 11/21/2019  . Hypomagnesemia 05/15/2019  . Alcoholism ([place]) 05/14/2019  . Cough 10/31/2018  . Hyponatremia 10/30/2018  . Alcohol withdrawal seizure ([place]) 06/26/2018  . History of alcohol abuse 06/25/2018  . Leukocytosis 06/25/2018  . Persistent atrial fibrillation ([place]) 12/19/2017  . HFrEF (heart failure with reduced ejection fraction) ([place]) 12/04/2017  . Memory difficulties 10/26/2017  . 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 Sensorineural hearing loss (SNHL), 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  . Prediabetes 04/07/2016  . Full thickness rotator cuff tear 04/23/2014  . Anticoagulation adequate, new on eliquis 03/07/2014  . SKIN CANCER, HX OF 03/24/2010  . DIVERTICULOSIS, COLON 12/11/2008  . SPINAL STENOSIS 01/14/2008  . BACK PAIN, LUMBAR, WITH RADICULOPATHY 01/02/2008  . Essential hypertension 10/31/2007  . 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 GERD 09/27/2007   Past Medical History:  Past Medical History:  Diagnosis Date  . Diverticulosis 2006  . Erectile dysfunction   . GERD (gastroesophageal reflux disease)   . History of skin cancer   . History of syncope 1994   no etiology; no recurrence  . Hyperlipidemia    LDL goal = < 100, ideally < 70  . Hypertension   . Persistent atrial fibrillation ([place]) 03/06/2014  . PVC's (premature ventricular contractions)   . Rotator cuff tear   . Visit for monitoring Tikosyn therapy 12/19/2017    Past Surgical History:  Past Surgical History:  Procedure Laterality Date  . CARDIOVERSION N/A 12/05/2017   Procedure: CARDIOVERSION;  Surgeon: Dorothy Spark, MD;  [facility] ENDOSCOPY;  Service: Cardiovascular;  Laterality: N/A;  . COLONOSCOPY  2006   Diverticulosis  . CYSTECTOMY     lip  . CYSTECTOMY     anterior thorax-chest  . INGUINAL HERNIA REPAIR      x 2  . 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 SHOULDER OPEN ROTATOR CUFF REPAIR Right 04/23/2014   Procedure: RIGHT SHOULDER ROTATOR CUFF REPAIR WITH GRAFT AND ANCHORS . OPEN ACROMIONECTOMY;  Surgeon: Tobi Bastos, MD;  Location: [facility] ORS;  Service: Orthopedics;  Laterality: Right;  . SPINE SURGERY  2012   Dr Gladstone Lighter  . TEE WITHOUT CARDIOVERSION N/A 12/05/2017   Procedure: TRANSESOPHAGEAL ECHOCARDIOGRAM (TEE);  Surgeon: Dorothy Spark, MD;  Location: [facility] ENDOSCOPY;  Service: Cardiovascular;  Laterality: N/A;  . TONSILLECTOMY AND ADENOIDECTOMY     HPI:  Pt is a 74 y.o. male with medical history significant of atrial fibrillation on chronic anticoagulation with Eliquis, alcohol abuse, GERD, hyperlipidemia, who presented to the ED with worsening confusion for the 1-2 days, decreased oral intake, generalized weakness, and "thick/slurred speech". CXR 5/29: Slight left base atelectasis. Lungs elsewhere clear. MRI brain negative for acute changes.    Assessment / Plan / Recommendation Clinical Impression  Pt was alert and cooperative for speech/language/cognition re-evaluation. An evaluation was completed by this SLP on 04/19/20 and he presented  with some mild difficulty in memory and executive function which pt stated was his baseline. Pt reported today that he feels different now compared to before but was unable to identify the cognitive-linguistic changes which he felt. His  wife was contacted prior to the re-evaluation and she reported that the pt's cognition is worse now that over the weekend. She stated that the pt "seems disconnected" and cited difficulty with memory and "following a conversation". She expressed that his speech sounds "slurred" and that he called her at midnight with no recollection of her being there yesterday even though reports being there for six hours.   The [facility] Mental Status Examination was completed to evaluate the pt's cognitive-linguistic skills. He achieved a score of 11/30 which is below the normal limits of 27 or more out of 30. He exhibited difficulty in the areas of awareness, attention, memory, reasoning, temporal orientation, executive function, and complex problem solving. Mild articulatory imprecision was noted but speech was adequately intelligibile throughout the evaluation. Skilled SLP services are clinically indicated at this time to improve cognitive-linguistic function.    SLP Assessment  SLP Recommendation/Assessment: Patient needs continued Speech Lanaguage Pathology Services SLP Visit Diagnosis: Cognitive communication deficit (R41.841)    Follow Up Recommendations  Skilled Nursing facility    Frequency and Duration min 2x/week  2 weeks      SLP Evaluation Cognition  Overall Cognitive Status: Impaired/Different from baseline Arousal/Alertness: Awake/alert Orientation Level: Oriented to person;Oriented to place;Disoriented to time;Disoriented to situation(Month: May; Date: 31; Day: Monday) Attention: Focused;Sustained Focused Attention: Impaired Focused Attention Impairment: Verbal complex Sustained Attention: Impaired Sustained Attention Impairment: Verbal complex Memory: Impaired Memory Impairment: Retrieval deficit;Decreased recall of new information(Immediate: 3/5; delayed: 2/5) Awareness: Impaired Awareness Impairment: Emergent impairment Problem Solving: Impaired Problem Solving  Impairment: Verbal complex Executive Function: Reasoning;Sequencing;Organizing Reasoning: Impaired Sequencing: Impaired Sequencing Impairment: Verbal complex(Clock drawing: 0/4) Organizing: Impaired Organizing Impairment: Verbal complex(Backward digit span: 1/3)       Comprehension  Auditory Comprehension Overall Auditory Comprehension: Appears within functional limits for tasks assessed Yes/No Questions: Within Functional Limits Commands: Within Functional Limits Conversation: Complex Interfering Components: Hearing;Processing speed    Expression Expression Primary Mode of Expression: Verbal Verbal Expression Overall Verbal Expression: Appears within functional limits for tasks assessed Initiation: No impairment Level of Generative/Spontaneous Verbalization: Conversation Repetition: No impairment Naming: No impairment Pragmatics: No impairment   Oral / Motor  Oral Motor/Sensory Function Overall Oral Motor/Sensory Function: Within functional limits Motor Speech Overall Motor Speech: Impaired Respiration: Within functional limits Phonation: Normal Resonance: Within functional limits Articulation: Impaired Level of Impairment: Conversation Intelligibility: Intelligible Motor Planning: Witnin functional limits Motor Speech Errors: Aware;Consistent   Quinntin Malter I. Hardin Negus, [place], [place] Office number [phone] Pager [place] 04/22/2020, 10:04 AM

## 2020-04-22 NOTE — Progress Notes (Signed)
PROGRESS NOTE    Sergio Mack  [id number] DOB: 01-05-1946 DOA: 04/18/2020 PCP: Binnie Rail, MD    Brief Narrative:  74 year old gentleman history of A. fib on chronic anticoagulation with Eliquis, history of alcohol abuse, GERD, hyperlipidemia presented to the ED with worsening confusion x2 days, decreased oral intake, weakness.  Patient admitted and being treated for alcohol withdrawal and UTI.  Assessment & Plan:   Principal Problem:   Acute metabolic encephalopathy Active Problems:   Essential hypertension   GERD   Memory difficulties   HFrEF (heart failure with reduced ejection fraction) (HCC)   Persistent atrial fibrillation (HCC)   History of alcohol abuse   Iron deficiency anemia   Acute lower UTI   Alcohol withdrawal (HCC)   Generalized weakness   Dehydration   1 acute metabolic encephalopathy Multifactorial secondary to acute UTI and alcohol withdrawal syndrome.  Patient per wife noted to have recently been started on a Librium detox protocol 2 days prior to admission.  Patient's last drink was 2 days prior to admission when he had gone on a 1 week alcohol binge.  Urinalysis done on admission concerning for UTI.  Urine culture with Staphylococcus epidermidis.  CT head negative.  MRI negative.  Patient afebrile.  Chest x-ray negative for any acute abnormalities.  Ammonia levels within normal limits.  Patient improving clinically.  IV fluids have been saline locked.  Status post 3 days IV Rocephin. Will continue Librium detox protocol and ativan withdrawal protocol.  Continued on thiamine 500 mg IV daily x3 days then back to thiamine 100 mg daily starting 6/4.  Supportive care.  2.  UTI Urine cultures with staph epidermidis.  Status post 3 days IV Rocephin.  No further antibiotics needed at this time  3.  Gastroesophageal reflux disease Continue PPI as tolerated  4.  Alcohol withdrawal/alcohol abuse Patient presented with confusion and generalized weakness.   Patient per wife had gone on a 1 week alcohol binge.  Last alcohol use was 2 days prior to admission.  Per wife patient with prior history of alcoholism and patient noted to have been sober for approximately 35 years and subsequently resumed drinking 2 years ago intermittently.  Per wife patient had been started on Librium taper per PCP for alcohol detox.  Patient currently on the Librium detox protocol as well as the Ativan withdrawal protocol.  Continue folic acid, multivitamin.  Placed on thiamine 500 mg IV daily x3 days then back to thiamine 100 mg daily per above.  Supportive care.  SLP for cognitive evaluation.  Per wife patient has signed up for an intensive outpatient alcohol detox program which patient will go to post discharge.  5.  Dehydration Improved with hydration.  IV fluids have been saline locked.    6.  Generalized weakness Likely secondary to UTI and alcohol withdrawal.  Improving clinically.  PT/OT.  PT recommending SNF however patient really wants to go home and if insistent on going home may need to order home health.  SNF discussed with patient again and asked patient to strongly consider SNF placement and to discuss with wife at his noted per OT that wife really wanted patient to go to SNF as she feels may be unable to manage patient at home. TOC following. COVID test in anticipation for SNF  7.  Persistent atrial fibrillation Currently in normal sinus rhythm.  Continue home regimen Tikosyn and metoprolol for rate control.  Eliquis for anticoagulation as tolerated  8.  Leukocytosis Likely secondary  to UTI versus reactive.  Urine cultures with staph epidermidis.  Chest x-ray negative for any acute infiltrate.  Status post 3 days IV Rocephin.  Afebrile.  No further antibiotics needed at this time.   9.  Hypokalemia/Hypophosphatemia Electrolytes stable Repeat bmet in AM  DVT prophylaxis: Eliquis Code Status: DNR Family Communication: Pt in room, family not at  bedside  Status is: Inpatient  Remains inpatient appropriate because:Unsafe d/c plan   Dispo: The patient is from: Home              Anticipated d/c is to: SNF              Anticipated d/c date is: 1 day, pending COVID test for SNF placement              Patient currently is medically stable to d/c. Awaiting SNF placement   Consultants:     Procedures:     Antimicrobials: Anti-infectives (From admission, onward)   Start     Dose/Rate Route Frequency Ordered Stop   04/19/20 1300  cefTRIAXone (ROCEPHIN) 1 g in sodium chloride 0.9 % 100 mL IVPB     1 g 200 mL/hr over 30 Minutes Intravenous Every 24 hours 04/18/20 1727 04/21/20 0700   04/18/20 1230  cefTRIAXone (ROCEPHIN) 1 g in sodium chloride 0.9 % 100 mL IVPB     1 g 200 mL/hr over 30 Minutes Intravenous  Once 04/18/20 1220 04/18/20 1527       Subjective: Without complaints this AM. Feels weak  Objective: Vitals:   04/21/20 2040 04/21/20 2240 04/22/20 0429 04/22/20 0859  BP: 128/68 118/70 123/70 126/73  Pulse: 80 99 98 95  Resp: 18  18 18   Temp: 98.8 F (37.1 C) 98 F (36.7 C) 98 F (36.7 C) 98.2 F (36.8 C)  TempSrc: Oral  Oral Oral  SpO2: 98% 99% 99% 100%  Weight:      Height:        Intake/Output Summary (Last 24 hours) at 04/22/2020 1710 Last data filed at 04/22/2020 1330 Gross per 24 hour  Intake 900 ml  Output 1055 ml  Net -155 ml   Filed Weights   04/18/20 1036 04/18/20 2149  Weight: 88 kg 86.2 kg    Examination:  General exam: Appears calm and comfortable  Respiratory system: Clear to auscultation. Respiratory effort normal. Cardiovascular system: S1 & S2 heard, Regular Gastrointestinal system: Abdomen is nondistended, soft and nontender. No organomegaly or masses felt. Normal bowel sounds heard. Central nervous system: Alert and oriented. No focal neurological deficits. Extremities: Symmetric 5 x 5 power. Skin: No rashes, lesions  Psychiatry: Judgement and insight appear normal. Mood &  affect appropriate.   Data Reviewed: I have personally reviewed following labs and imaging studies  CBC: Recent Labs  Lab 04/18/20 1024 04/19/20 0644 04/20/20 0315  WBC 10.8* 8.6 9.0  NEUTROABS  --   --  6.0  HGB 14.0 12.7* 12.3*  HCT 42.1 38.7* 37.7*  MCV 87.0 89.0 89.1  PLT 140* 121* [id number]*   Basic Metabolic Panel: Recent Labs  Lab 04/18/20 1024 04/18/20 2158 04/19/20 0644 04/20/20 0315 04/20/20 0954 04/21/20 0312 04/22/20 0417  NA 135  --  136 137  --  137  --   K 4.1  --  3.1* 3.6  --  4.0  --   CL 98  --  104 103  --  103  --   CO2 23  --  25 24  --  27  --  GLUCOSE 145*  --  128* 129*  --  109*  --   BUN 22  --  11 7*  --  7*  --   CREATININE 1.13  --  0.87 0.95  --  0.93  --   CALCIUM 9.0  --  8.3* 8.4*  --  8.7*  --   MG  --  1.8  --   --  1.7 1.7 2.2  PHOS  --  2.0*  --  3.9  --   --   --    GFR: Estimated Creatinine Clearance: 78.8 mL/min (by C-G formula based on SCr of 0.93 mg/dL). Liver Function Tests: Recent Labs  Lab 04/18/20 1024 04/19/20 0644 04/20/20 0315  AST 26 21  --   ALT 17 15  --   ALKPHOS 61 59  --   BILITOT 1.6* 0.8  --   PROT 6.7 6.1*  --   ALBUMIN 3.6 3.0* 2.7*   No results for input(s): LIPASE, AMYLASE in the last 168 hours. Recent Labs  Lab 04/18/20 2158  AMMONIA 21   Coagulation Profile: Recent Labs  Lab 04/18/20 1024  INR 1.3*   Cardiac Enzymes: No results for input(s): CKTOTAL, CKMB, CKMBINDEX, TROPONINI in the last 168 hours. BNP (last 3 results) No results for input(s): PROBNP in the last 8760 hours. HbA1C: No results for input(s): HGBA1C in the last 72 hours. CBG: Recent Labs  Lab 04/18/20 1020  GLUCAP 143*   Lipid Profile: No results for input(s): CHOL, HDL, LDLCALC, TRIG, CHOLHDL, LDLDIRECT in the last 72 hours. Thyroid Function Tests: No results for input(s): TSH, T4TOTAL, FREET4, T3FREE, THYROIDAB in the last 72 hours. Anemia Panel: No results for input(s): VITAMINB12, FOLATE, FERRITIN, TIBC, IRON,  RETICCTPCT in the last 72 hours. Sepsis Labs: No results for input(s): PROCALCITON, LATICACIDVEN in the last 168 hours.  Recent Results (from the past 240 hour(s))  Urine Culture     Status: Abnormal   Collection Time: 04/18/20 12:21 PM   Specimen: Urine, Random  Result Value Ref Range Status   Specimen Description URINE, RANDOM  Final   Special Requests   Final    NONE Performed at [facility], [address], [place], [place] [address]    Culture >=100,000 COLONIES/mL STAPHYLOCOCCUS EPIDERMIDIS (A)  Final   Report Status 04/20/2020 FINAL  Final   Organism ID, Bacteria STAPHYLOCOCCUS EPIDERMIDIS (A)  Final      Susceptibility   Staphylococcus epidermidis - MIC*    CIPROFLOXACIN <=0.5 SENSITIVE Sensitive     GENTAMICIN <=0.5 SENSITIVE Sensitive     NITROFURANTOIN <=16 SENSITIVE Sensitive     OXACILLIN <=0.25 SENSITIVE Sensitive     TETRACYCLINE 2 SENSITIVE Sensitive     VANCOMYCIN 1 SENSITIVE Sensitive     TRIMETH/SULFA >=320 RESISTANT Resistant     CLINDAMYCIN <=0.25 SENSITIVE Sensitive     RIFAMPIN <=0.5 SENSITIVE Sensitive     Inducible Clindamycin NEGATIVE Sensitive     * >=100,000 COLONIES/mL STAPHYLOCOCCUS EPIDERMIDIS  SARS Coronavirus 2 by RT PCR (hospital order, performed in [place] hospital lab) Nasopharyngeal Nasopharyngeal Swab     Status: None   Collection Time: 04/18/20  4:27 PM   Specimen: Nasopharyngeal Swab  Result Value Ref Range Status   SARS Coronavirus 2 NEGATIVE NEGATIVE Final    Comment: (NOTE) SARS-CoV-2 target nucleic acids are NOT DETECTED. The SARS-CoV-2 RNA is generally detectable in upper and lower respiratory specimens during the acute phase of infection. The lowest concentration of SARS-CoV-2 viral  copies this assay can detect is 250 copies / mL. A negative result does not preclude SARS-CoV-2 infection and should not be used as the sole basis for treatment or other patient management decisions.  A negative result may occur  with improper specimen collection / handling, submission of specimen other than nasopharyngeal swab, presence of viral mutation(s) within the areas targeted by this assay, and inadequate number of viral copies (<250 copies / mL). A negative result must be combined with clinical observations, patient history, and epidemiological information. Fact Sheet for Patients:   StrictlyIdeas.no Fact Sheet for Healthcare Providers: BankingDealers.co.za This test is not yet approved or cleared  by the [place] FDA and has been authorized for detection and/or diagnosis of SARS-CoV-2 by FDA under an Emergency Use Authorization (EUA).  This EUA will remain in effect (meaning this test can be used) for the duration of the COVID-19 declaration under Section 564(b)(1) of the Act, 21 U.S.C. section 360bbb-3(b)(1), unless the authorization is terminated or revoked sooner. Performed at [facility], [place] [address], [place], [place] [address]      Radiology Studies: No results found.  Scheduled Meds: . apixaban  5 mg Oral BID  . cholecalciferol  1,000 Units Oral Daily  . dofetilide  500 mcg Oral BID  . folic acid  1 mg Oral Daily  . furosemide  20 mg Oral Daily  . levETIRAcetam  500 mg Oral BID  . lisinopril  2.5 mg Oral Daily  . magnesium oxide  400 mg Oral Daily  . metoprolol tartrate  100 mg Oral BID  . multivitamin with minerals  1 tablet Oral Daily  . pantoprazole  40 mg Oral Q0600  . sodium chloride flush  3 mL Intravenous Q12H  . [START ON 04/24/2020] thiamine  100 mg Oral Daily  . vitamin B-12  1,000 mcg Oral Daily   Continuous Infusions: . thiamine injection 500 mg (04/22/20 1051)     LOS: 3 days   Marylu Lund, MD [facility] Pager On Amion  If 7PM-7AM, please contact night-coverage 04/22/2020, 5:10 PM

## 2020-04-22 NOTE — Plan of Care (Signed)
  Problem: Education: Goal: Knowledge of General Education information will improve Description: Including pain rating scale, medication(s)/side effects and non-pharmacologic comfort measures Outcome: Progressing   Problem: Health Behavior/Discharge Planning: Goal: Ability to manage health-related needs will improve Outcome: Progressing   Problem: Safety: Goal: Ability to remain free from injury will improve Outcome: Progressing   

## 2020-04-23 LAB — COMPREHENSIVE METABOLIC PANEL
ALT: 23 U/L (ref 0–44)
AST: 28 U/L (ref 15–41)
Albumin: 3.1 g/dL — ABNORMAL LOW (ref 3.5–5.0)
Alkaline Phosphatase: 65 U/L (ref 38–126)
Anion gap: 9 (ref 5–15)
BUN: 11 mg/dL (ref 8–23)
CO2: 27 mmol/L (ref 22–32)
Calcium: 8.5 mg/dL — ABNORMAL LOW (ref 8.9–10.3)
Chloride: 101 mmol/L (ref 98–111)
Creatinine, Ser: 1.16 mg/dL (ref 0.61–1.24)
GFR calc Af Amer: >60 mL/min (ref >60)
GFR calc non Af Amer: >60 mL/min (ref >60)
Glucose, Bld: 108 mg/dL — ABNORMAL HIGH (ref 70–99)
Potassium: 4 mmol/L (ref 3.5–5.1)
Sodium: 137 mmol/L (ref 135–145)
Total Bilirubin: 0.5 mg/dL (ref 0.3–1.2)
Total Protein: 6.2 g/dL — ABNORMAL LOW (ref 6.5–8.1)

## 2020-04-23 LAB — CBC
HCT: 38.6 % — ABNORMAL LOW (ref 39.0–52.0)
Hemoglobin: 12.4 g/dL — ABNORMAL LOW (ref 13.0–17.0)
MCH: 28.8 pg (ref 26.0–34.0)
MCHC: 32.1 g/dL (ref 30.0–36.0)
MCV: 89.8 fL (ref 80.0–100.0)
Platelets: 207 10*3/uL (ref 150–400)
RBC: 4.3 MIL/uL (ref 4.22–5.81)
RDW: 14.6 % (ref 11.5–15.5)
WBC: 7.4 10*3/uL (ref 4.0–10.5)
nRBC: 0 % (ref 0.0–0.2)

## 2020-04-23 LAB — MAGNESIUM: Magnesium: 1.9 mg/dL (ref 1.7–2.4)

## 2020-04-23 MED ORDER — PANTOPRAZOLE SODIUM 40 MG PO TBEC: 40.0000 mg | DELAYED_RELEASE_TABLET | Freq: Every day | ORAL | 0 refills | Status: DC

## 2020-04-23 NOTE — Discharge Summary (Signed)
Physician Discharge Summary  Sergio Mack [id number] DOB: 02-16-1946 DOA: 04/18/2020  PCP: Binnie Rail, MD  Admit date: 04/18/2020 Discharge date: 04/23/2020  Admitted From: Home Disposition:  SNF  Recommendations for Outpatient Follow-up:  1. Follow up with PCP in 1-2 weeks 2. Follow up with Cardiology as scheduled 3. COVID test is neg  Discharge Condition:Stable CODE STATUS:DNR Diet recommendation: Heart healthy   Brief/Interim Summary: 74 year old gentleman history of A. fib on chronic anticoagulation with Eliquis, history of alcohol abuse, GERD, hyperlipidemia presented to the ED with worsening confusion x2 days, decreased oral intake, weakness. Patient admitted and being treated for alcohol withdrawal and UTI.  Discharge Diagnoses:  Principal Problem:   Acute metabolic encephalopathy Active Problems:   Essential hypertension   GERD   Memory difficulties   HFrEF (heart failure with reduced ejection fraction) (HCC)   Persistent atrial fibrillation (HCC)   History of alcohol abuse   Iron deficiency anemia   Acute lower UTI   Alcohol withdrawal (HCC)   Generalized weakness   Dehydration   1 acute metabolic encephalopathy Multifactorial secondary to acute UTI and alcohol withdrawal syndrome. Patient per wife noted to have recently been started on a Librium detox protocol 2 days prior to admission. Patient's last drink was 2 days prior to admission when he had gone on a 1 week alcohol binge. Urinalysis done on admission concerning for UTI. Urine culture with Staphylococcus epidermidis. CT head negative. MRI negative. Patient afebrile. Chest x-ray negative for any acute abnormalities. Ammonia levels within normal limits. Patient improving clinically. IV fluids have been saline locked. Status post 3 days IV Rocephin. Completed librium taper. Remained stable  2. UTI Urine cultures with staph epidermidis.Status post 3 days IV Rocephin. No further  antibiotics needed at this time  3. Gastroesophageal reflux disease ContinuePPI as tolerated  4. Alcohol withdrawal/alcohol abuse Patient presented with confusion and generalized weakness. Patient per wife had gone on a 1 week alcohol binge. Last alcohol use was 2 days prior to admission. Per wife patient with prior history of alcoholism and patient noted to have been sober for approximately 35 years and subsequently resumed drinking 2 years ago intermittently. Per wife patient had been started on Librium taper per PCP for alcohol detox. Patient currently on the Librium detox protocol as well as the Ativan withdrawal protocol. Continue folic acid, multivitamin.Placed on thiamine 500 mg IV daily x3 days then back to thiamine 100 mg daily per above. Supportive care.SLP for cognitive evaluation. Per wife patient has signed up for an intensive outpatient alcohol detox program which patient will go to post discharge.  5. Dehydration Improved with hydration.IV fluids have been saline locked.   6. Generalized weakness Likely secondary to UTI and alcohol withdrawal. Improving clinically. PT/OT. PT recommending SNF however patient really wants to go home and if insistent on going home may need to order home health.SNF discussed with patient again and asked patient to strongly consider SNF placement and to discuss with wife at his noted per OT that wife really wanted patient to go to SNF as she feels may be unable to manage patient at home. TOC following. COVID test is neg  7. Persistent atrial fibrillation Currently in normal sinus rhythm. Continue home regimen Tikosyn and metoprolol for rate control. Eliquis for anticoagulation as tolerated  8. Leukocytosis Likely secondary to UTI versus reactive. Urine cultures with staph epidermidis. Chest x-ray negative for any acute infiltrate. Status post 3 days IV Rocephin. Afebrile. No further antibiotics needed at this time.  9. Hypokalemia/Hypophosphatemia Electrolytes remained stable  Discharge Instructions   Allergies as of 04/23/2020      Reactions   Glucosamine Itching, Other (See Comments)   Itching feet and palms, tightness in chest also   Shellfish-derived Products Anaphylaxis, Shortness Of Breath, Itching   Itching feet and palms, tightness in chest also Can eat oysters, can do dyes for tests and can eat just a few shrimp      Medication List    STOP taking these medications   amLODipine 2.5 MG tablet Commonly known as: NORVASC   chlordiazePOXIDE 25 MG capsule Commonly known as: LIBRIUM   traZODone 100 MG tablet Commonly known as: DESYREL     TAKE these medications   apixaban 5 MG Tabs tablet Commonly known as: Eliquis Take 1 tablet (5 mg total) by mouth 2 (two) times daily.   dofetilide 500 MCG capsule Commonly known as: TIKOSYN Take 1 capsule (500 mcg total) by mouth 2 (two) times daily.   folic acid 1 MG tablet Commonly known as: FOLVITE TAKE 1 TABLET EVERY DAY   furosemide 20 MG tablet Commonly known as: LASIX TAKE 1 TABLET EVERY DAY   GNP Vitamin B-1 100 MG tablet Generic drug: thiamine TAKE 1 TABLET EVERY DAY What changed: how much to take   ICAPS AREDS 2 PO Take 1 capsule by mouth 2 (two) times daily.   levETIRAcetam 500 MG tablet Commonly known as: KEPPRA Take 1 tablet (500 mg total) by mouth 2 (two) times daily.   magnesium oxide 400 (241.3 Mg) MG tablet Commonly known as: MAG-OX TAKE 1 TABLET EVERY DAY   magnesium oxide 400 MG tablet Commonly known as: MAG-OX Take 1 tablet (400 mg total) by mouth daily.   metoprolol tartrate 100 MG tablet Commonly known as: LOPRESSOR Take 1 tablet (100 mg total) by mouth 2 (two) times daily.   pantoprazole 40 MG tablet Commonly known as: PROTONIX Take 1 tablet (40 mg total) by mouth daily at 6 (six) AM. Start taking on: April 24, 2020   quinapril 40 MG tablet Commonly known as: ACCUPRIL Take 1 tablet (40 mg  total) by mouth every morning.   sodium chloride 0.65 % Soln nasal spray Commonly known as: OCEAN Place 1 spray into both nostrils as needed for congestion.   vitamin B-12 1000 MCG tablet Commonly known as: CYANOCOBALAMIN TAKE 1 TABLET EVERY DAY   Vitamin D-3 25 MCG (1000 UT) Caps Take 1 capsule (1,000 Units total) by mouth daily.   cholecalciferol 25 MCG (1000 UNIT) tablet Commonly known as: VITAMIN D TAKE 1 TABLET EVERY DAY            Durable Medical Equipment  (From admission, onward)         Start     Ordered   04/20/20 1006  For home use only DME Walker rolling  Once    Question Answer Comment  Walker: With [place] Wheels   Patient needs a walker to treat with the following condition Debility      04/20/20 1005   04/19/20 1532  For home use only DME 3 n 1  Once     04/19/20 1531         Follow-up Information    Binnie Rail, MD. Schedule an appointment as soon as possible for a visit in 1 week(s).   Specialty: Internal Medicine Contact information: [place] [address] [phone]        Thompson Grayer, MD .  Specialty: Cardiology Contact information: [address] [place] [address] [phone]          Allergies  Allergen Reactions  . Glucosamine Itching and Other (See Comments)    Itching feet and palms, tightness in chest also  . Shellfish-Derived Products Anaphylaxis, Shortness Of Breath and Itching    Itching feet and palms, tightness in chest also  Can eat oysters, can do dyes for tests and can eat just a few shrimp     Procedures/Studies: CT HEAD WO CONTRAST  Result Date: 04/18/2020 CLINICAL DATA:  Altered mental status. EXAM: CT HEAD WITHOUT CONTRAST TECHNIQUE: Contiguous axial images were obtained from the base of the skull through the vertex without intravenous contrast. COMPARISON:  None. FINDINGS: Brain: No acute intracranial hemorrhage. No focal mass lesion. No CT evidence of acute  infarction. No midline shift or mass effect. No hydrocephalus. Basilar cisterns are patent. There are periventricular and subcortical white matter hypodensities. Generalized cortical atrophy. Vascular: No hyperdense vessel or unexpected calcification. Skull: Normal. Negative for fracture or focal lesion. Sinuses/Orbits: Paranasal sinuses and mastoid air cells are clear. Orbits are clear. Other: None. IMPRESSION: 1. No acute intracranial findings. 2. Mild atrophy and moderate periventricular white matter small vessel ischemia Electronically Signed   By: Suzy Bouchard M.D.   On: 04/18/2020 11:20   MR BRAIN WO CONTRAST  Result Date: 04/18/2020 CLINICAL DATA:  Altered mental status EXAM: MRI HEAD WITHOUT CONTRAST TECHNIQUE: Multiplanar, multiecho pulse sequences of the brain and surrounding structures were obtained without intravenous contrast. COMPARISON:  2019 FINDINGS: Brain: There is no acute infarction or intracranial hemorrhage. There is no intracranial mass, mass effect, or edema. There is no hydrocephalus or extra-axial fluid collection. Patchy and confluent areas of T2 hyperintensity in the supratentorial white matter are nonspecific but may reflect moderate chronic microvascular ischemic changes. Prominence of the ventricles and sulci reflects minor generalized parenchymal volume loss Vascular: Major vessel flow voids at the skull base are preserved. Skull and upper cervical spine: Normal marrow signal is preserved. Sinuses/Orbits: Paranasal sinuses are aerated. Bilateral lens replacements. Other: Sella is unremarkable.  Mastoid air cells are clear. IMPRESSION: No evidence of recent infarction, hemorrhage, or mass. Moderate chronic microvascular ischemic changes. Electronically Signed   By: Macy Mis M.D.   On: 04/18/2020 14:39   Portable chest 1 View  Result Date: 04/18/2020 CLINICAL DATA:  Unresponsive EXAM: PORTABLE CHEST 1 VIEW COMPARISON:  June 24, 2018 FINDINGS: There is slight  atelectatic change in the left base. The lungs elsewhere are clear. The heart is upper normal in size with pulmonary vascularity normal. There is aortic atherosclerosis. No pneumothorax. There is degenerative change in the thoracic spine. IMPRESSION: Slight left base atelectasis. Lungs elsewhere clear. Heart upper normal in size. Electronically Signed   By: Lowella Grip III M.D.   On: 04/18/2020 17:38     Subjective: Questioning about rehab  Discharge Exam: Vitals:   04/23/20 0446 04/23/20 0900  BP: 134/87 (!) 142/83  Pulse: 90 83  Resp: 18 18  Temp: 98.8 F (37.1 C) 98 F (36.7 C)  SpO2: 96% 97%   Vitals:   04/23/20 0323 04/23/20 0446 04/23/20 0500 04/23/20 0900  BP: (!) 148/84 134/87  (!) 142/83  Pulse: 85 90  83  Resp:  18  18  Temp:  98.8 F (37.1 C)  98 F (36.7 C)  TempSrc:    Oral  SpO2:  96%  97%  Weight:   86.2 kg   Height:  General: Pt is alert, awake, not in acute distress Cardiovascular: RRR, S1/S2 +, no rubs, no gallops Respiratory: CTA bilaterally, no wheezing, no rhonchi Abdominal: Soft, NT, ND, bowel sounds + Extremities: no edema, no cyanosis   The results of significant diagnostics from this hospitalization (including imaging, microbiology, ancillary and laboratory) are listed below for reference.     Microbiology: Recent Results (from the past 240 hour(s))  Urine Culture     Status: Abnormal   Collection Time: 04/18/20 12:21 PM   Specimen: Urine, Random  Result Value Ref Range Status   Specimen Description URINE, RANDOM  Final   Special Requests   Final    NONE Performed at [facility], [address] [place], [place] [address]    Culture >=100,000 COLONIES/mL STAPHYLOCOCCUS EPIDERMIDIS (A)  Final   Report Status 04/20/2020 FINAL  Final   Organism ID, Bacteria STAPHYLOCOCCUS EPIDERMIDIS (A)  Final      Susceptibility   Staphylococcus epidermidis - MIC*    CIPROFLOXACIN <=0.5 SENSITIVE Sensitive     GENTAMICIN <=0.5  SENSITIVE Sensitive     NITROFURANTOIN <=16 SENSITIVE Sensitive     OXACILLIN <=0.25 SENSITIVE Sensitive     TETRACYCLINE 2 SENSITIVE Sensitive     VANCOMYCIN 1 SENSITIVE Sensitive     TRIMETH/SULFA >=320 RESISTANT Resistant     CLINDAMYCIN <=0.25 SENSITIVE Sensitive     RIFAMPIN <=0.5 SENSITIVE Sensitive     Inducible Clindamycin NEGATIVE Sensitive     * >=100,000 COLONIES/mL STAPHYLOCOCCUS EPIDERMIDIS  SARS Coronavirus 2 by RT PCR (hospital order, performed in [place] hospital lab) Nasopharyngeal Nasopharyngeal Swab     Status: None   Collection Time: 04/18/20  4:27 PM   Specimen: Nasopharyngeal Swab  Result Value Ref Range Status   SARS Coronavirus 2 NEGATIVE NEGATIVE Final    Comment: (NOTE) SARS-CoV-2 target nucleic acids are NOT DETECTED. The SARS-CoV-2 RNA is generally detectable in upper and lower respiratory specimens during the acute phase of infection. The lowest concentration of SARS-CoV-2 viral copies this assay can detect is 250 copies / mL. A negative result does not preclude SARS-CoV-2 infection and should not be used as the sole basis for treatment or other patient management decisions.  A negative result may occur with improper specimen collection / handling, submission of specimen other than nasopharyngeal swab, presence of viral mutation(s) within the areas targeted by this assay, and inadequate number of viral copies (<250 copies / mL). A negative result must be combined with clinical observations, patient history, and epidemiological information. Fact Sheet for Patients:   StrictlyIdeas.no Fact Sheet for Healthcare Providers: BankingDealers.co.za This test is not yet approved or cleared  by the [place] FDA and has been authorized for detection and/or diagnosis of SARS-CoV-2 by FDA under an Emergency Use Authorization (EUA).  This EUA will remain in effect (meaning this test can be used) for the duration  of the COVID-19 declaration under Section 564(b)(1) of the Act, 21 U.S.C. section 360bbb-3(b)(1), unless the authorization is terminated or revoked sooner. Performed at [facility], [place] [address] [place], [place] [address]   SARS CORONAVIRUS 2 (TAT 6-24 HRS) Nasopharyngeal Nasopharyngeal Swab     Status: None   Collection Time: 04/22/20  6:30 PM   Specimen: Nasopharyngeal Swab  Result Value Ref Range Status   SARS Coronavirus 2 NEGATIVE NEGATIVE Final    Comment: (NOTE) SARS-CoV-2 target nucleic acids are NOT DETECTED. The SARS-CoV-2 RNA is generally detectable in upper and lower respiratory specimens during the  acute phase of infection. Negative results do not preclude SARS-CoV-2 infection, do not rule out co-infections with other pathogens, and should not be used as the sole basis for treatment or other patient management decisions. Negative results must be combined with clinical observations, patient history, and epidemiological information. The expected result is Negative. Fact Sheet for Patients: SugarRoll.be Fact Sheet for Healthcare Providers: https://www.woods-mathews.com/ This test is not yet approved or cleared by the [place] FDA and  has been authorized for detection and/or diagnosis of SARS-CoV-2 by FDA under an Emergency Use Authorization (EUA). This EUA will remain  in effect (meaning this test can be used) for the duration of the COVID-19 declaration under Section 56 4(b)(1) of the Act, 21 U.S.C. section 360bbb-3(b)(1), unless the authorization is terminated or revoked sooner. Performed at [facility], [place] [address] [place], [place] [address]      Labs: BNP (last 3 results) No results for input(s): BNP in the last 8760 hours. Basic Metabolic Panel: Recent Labs  Lab 04/18/20 1024 04/18/20 2158 04/19/20 0644 04/20/20 0315 04/20/20 0954 04/21/20 0312 04/22/20 0417 04/23/20 0425  NA 135  --   136 137  --  137  --  137  K 4.1  --  3.1* 3.6  --  4.0  --  4.0  CL 98  --  104 103  --  103  --  101  CO2 23  --  25 24  --  27  --  27  GLUCOSE 145*  --  128* 129*  --  109*  --  108*  BUN 22  --  11 7*  --  7*  --  11  CREATININE 1.13  --  0.87 0.95  --  0.93  --  1.16  CALCIUM 9.0  --  8.3* 8.4*  --  8.7*  --  8.5*  MG  --  1.8  --   --  1.7 1.7 2.2 1.9  PHOS  --  2.0*  --  3.9  --   --   --   --    Liver Function Tests: Recent Labs  Lab 04/18/20 1024 04/19/20 0644 04/20/20 0315 04/23/20 0425  AST 26 21  --  28  ALT 17 15  --  23  ALKPHOS 61 59  --  65  BILITOT 1.6* 0.8  --  0.5  PROT 6.7 6.1*  --  6.2*  ALBUMIN 3.6 3.0* 2.7* 3.1*   No results for input(s): LIPASE, AMYLASE in the last 168 hours. Recent Labs  Lab 04/18/20 2158  AMMONIA 21   CBC: Recent Labs  Lab 04/18/20 1024 04/19/20 0644 04/20/20 0315 04/23/20 0425  WBC 10.8* 8.6 9.0 7.4  NEUTROABS  --   --  6.0  --   HGB 14.0 12.7* 12.3* 12.4*  HCT 42.1 38.7* 37.7* 38.6*  MCV 87.0 89.0 89.1 89.8  PLT 140* 121* 143* 207   Cardiac Enzymes: No results for input(s): CKTOTAL, CKMB, CKMBINDEX, TROPONINI in the last 168 hours. BNP: Invalid input(s): POCBNP CBG: Recent Labs  Lab 04/18/20 1020  GLUCAP 143*   D-Dimer No results for input(s): DDIMER in the last 72 hours. Hgb A1c No results for input(s): HGBA1C in the last 72 hours. Lipid Profile No results for input(s): CHOL, HDL, LDLCALC, TRIG, CHOLHDL, LDLDIRECT in the last 72 hours. Thyroid function studies No results for input(s): TSH, T4TOTAL, T3FREE, THYROIDAB in the last 72 hours.  Invalid input(s): FREET3 Anemia work up No results for input(s):  VITAMINB12, FOLATE, FERRITIN, TIBC, IRON, RETICCTPCT in the last 72 hours. Urinalysis    Component Value Date/Time   COLORURINE YELLOW 04/18/2020 1057   APPEARANCEUR HAZY (A) 04/18/2020 1057   APPEARANCEUR Clear 06/26/2018 1101   LABSPEC 1.014 04/18/2020 1057   PHURINE 6.0 04/18/2020 1057    GLUCOSEU NEGATIVE 04/18/2020 1057   HGBUR MODERATE (A) 04/18/2020 1057   BILIRUBINUR NEGATIVE 04/18/2020 1057   BILIRUBINUR Negative 06/26/2018 1101   KETONESUR 20 (A) 04/18/2020 1057   PROTEINUR NEGATIVE 04/18/2020 1057   UROBILINOGEN 0.2 02/26/2014 0917   NITRITE POSITIVE (A) 04/18/2020 1057   LEUKOCYTESUR LARGE (A) 04/18/2020 1057   Sepsis Labs Invalid input(s): PROCALCITONIN,  WBC,  LACTICIDVEN Microbiology Recent Results (from the past 240 hour(s))  Urine Culture     Status: Abnormal   Collection Time: 04/18/20 12:21 PM   Specimen: Urine, Random  Result Value Ref Range Status   Specimen Description URINE, RANDOM  Final   Special Requests   Final    NONE Performed at [facility], [place] [address], [place], [place] [address]    Culture >=100,000 COLONIES/mL STAPHYLOCOCCUS EPIDERMIDIS (A)  Final   Report Status 04/20/2020 FINAL  Final   Organism ID, Bacteria STAPHYLOCOCCUS EPIDERMIDIS (A)  Final      Susceptibility   Staphylococcus epidermidis - MIC*    CIPROFLOXACIN <=0.5 SENSITIVE Sensitive     GENTAMICIN <=0.5 SENSITIVE Sensitive     NITROFURANTOIN <=16 SENSITIVE Sensitive     OXACILLIN <=0.25 SENSITIVE Sensitive     TETRACYCLINE 2 SENSITIVE Sensitive     VANCOMYCIN 1 SENSITIVE Sensitive     TRIMETH/SULFA >=320 RESISTANT Resistant     CLINDAMYCIN <=0.25 SENSITIVE Sensitive     RIFAMPIN <=0.5 SENSITIVE Sensitive     Inducible Clindamycin NEGATIVE Sensitive     * >=100,000 COLONIES/mL STAPHYLOCOCCUS EPIDERMIDIS  SARS Coronavirus 2 by RT PCR (hospital order, performed in [place] hospital lab) Nasopharyngeal Nasopharyngeal Swab     Status: None   Collection Time: 04/18/20  4:27 PM   Specimen: Nasopharyngeal Swab  Result Value Ref Range Status   SARS Coronavirus 2 NEGATIVE NEGATIVE Final    Comment: (NOTE) SARS-CoV-2 target nucleic acids are NOT DETECTED. The SARS-CoV-2 RNA is generally detectable in upper and lower respiratory specimens during the acute phase  of infection. The lowest concentration of SARS-CoV-2 viral copies this assay can detect is 250 copies / mL. A negative result does not preclude SARS-CoV-2 infection and should not be used as the sole basis for treatment or other patient management decisions.  A negative result may occur with improper specimen collection / handling, submission of specimen other than nasopharyngeal swab, presence of viral mutation(s) within the areas targeted by this assay, and inadequate number of viral copies (<250 copies / mL). A negative result must be combined with clinical observations, patient history, and epidemiological information. Fact Sheet for Patients:   StrictlyIdeas.no Fact Sheet for Healthcare Providers: BankingDealers.co.za This test is not yet approved or cleared  by the [place] FDA and has been authorized for detection and/or diagnosis of SARS-CoV-2 by FDA under an Emergency Use Authorization (EUA).  This EUA will remain in effect (meaning this test can be used) for the duration of the COVID-19 declaration under Section 564(b)(1) of the Act, 21 U.S.C. section 360bbb-3(b)(1), unless the authorization is terminated or revoked sooner. Performed at [facility], [place] [address] [place], [place] [address]   SARS CORONAVIRUS 2 (TAT 6-24 HRS) Nasopharyngeal Nasopharyngeal Swab  Status: None   Collection Time: 04/22/20  6:30 PM   Specimen: Nasopharyngeal Swab  Result Value Ref Range Status   SARS Coronavirus 2 NEGATIVE NEGATIVE Final    Comment: (NOTE) SARS-CoV-2 target nucleic acids are NOT DETECTED. The SARS-CoV-2 RNA is generally detectable in upper and lower respiratory specimens during the acute phase of infection. Negative results do not preclude SARS-CoV-2 infection, do not rule out co-infections with other pathogens, and should not be used as the sole basis for treatment or other patient management decisions. Negative  results must be combined with clinical observations, patient history, and epidemiological information. The expected result is Negative. Fact Sheet for Patients: SugarRoll.be Fact Sheet for Healthcare Providers: https://www.woods-mathews.com/ This test is not yet approved or cleared by the [place] FDA and  has been authorized for detection and/or diagnosis of SARS-CoV-2 by FDA under an Emergency Use Authorization (EUA). This EUA will remain  in effect (meaning this test can be used) for the duration of the COVID-19 declaration under Section 56 4(b)(1) of the Act, 21 U.S.C. section 360bbb-3(b)(1), unless the authorization is terminated or revoked sooner. Performed at [facility], [place] [address] [place], [place] [address]    Time spent: 30 min  SIGNED:   Marylu Lund, MD  [facility] 04/23/2020, 11:41 AM  If 7PM-7AM, please contact night-coverage

## 2020-04-23 NOTE — Progress Notes (Signed)
Tried twice  to reach RN at [facility] was not able to. Will try again later.

## 2020-04-23 NOTE — TOC Transition Note (Addendum)
Transition of Care (TOC) - CM/SW Discharge Note *Discharged to [facility] via non-emergency ambulance *Room number [id number] *Number for report: [phone]   Patient Details  Name: Sergio Mack MRN: [id number] Date of Birth: July 01, 1946  Transition of [facility]) CM/SW Contact:  Sable Feil, LCSW Phone Number: 04/23/2020, 2:29 PM   Clinical Narrative:  Patient medically stable for discharge and will go to [facility] for ST rehab today. Discharge clinicals transmitted to facility and nurse provided with information to call report. Wife advised of d/c and when transport scheduled.     Final next level of care: Skilled Nursing Facility([facility]) Barriers to Discharge: Barriers Resolved   Patient Goals and CMS Choice Patient states their goals for this hospitalization and ongoing recovery are:: Patient in agreement with discharging to a skilled facility for ST rehab CMS Medicare.gov Compare Post Acute Care list provided to:: Patient Represenative (must comment)(Wife provided with information regarding [email or web address]) Choice offered to / list presented to : Spouse  Discharge Placement   Existing PASRR number confirmed : 04/21/20          Patient chooses bed at: [facility] [place] Patient to be transferred to facility by: Non-emergency ambulance Name of family member notified: Wife Nihan Holzknecht by phone - [phone] Patient and family notified of of transfer: 04/23/20  Discharge Plan and Services  Patient will discharge to [facility] for ST rehab                                   Social Determinants of Health (SDOH) Interventions  No SDOH interventions requested or needed prior to discharge.   Readmission Risk Interventions No flowsheet data found.

## 2020-04-23 NOTE — Progress Notes (Signed)
DISCHARGE NOTE [facility] to be discharged Skilled nursing facility per MD order. Patient verbalized understanding.  Skin clean, dry and intact without evidence of skin break down, no evidence of skin tears noted. IV catheter discontinued intact. Site without signs and symptoms of complications. Dressing and pressure applied. Pt denies pain at the site currently. No complaints noted.  Patient free of lines, drains, and wounds.   Discharge packet assembled. An After Visit Summary (AVS) was printed and given to the EMS personnel. Patient escorted via stretcher and discharged to [facility] via ambulance. Report called to accepting facility; all questions and concerns addressed.   Arlyss Repress, RN

## 2020-04-24 ENCOUNTER — Telehealth: Payer: Self-pay | Admitting: *Deleted

## 2020-04-24 NOTE — Telephone Encounter (Signed)
Pt was on TCM report admitted 04/18/20 for UTI and treated for alcohol withdrawal. Pt was having worsening confusion x2 days, decreased oral intake, weakness. Urinalysis done on admission concerning for UTI. Urine culture with Staphylococcus epidermidis. CT head negative. MRI negative. Generalized weakness was likely due secondary to UTI and alcohol withdrawal.  Pt needPT/OT and PT recommending SNF. Pt D/C 04/23/20 to SNF. Per summary will need to f/u w/PCP once leaving SNF.Marland KitchenJohny Chess

## 2020-05-05 NOTE — Progress Notes (Signed)
Subjective:    Patient ID: Sergio Mack, male    DOB: 28-Jul-1946, 74 y.o.   MRN: [id number]  HPI The patient is here for follow up from rehab.  He is here with his wife.   Admitted 5/29-6/3 for acute metabolic encephalopathy with weakness and confusion.  He was in alcohol withdrawal and had a UTI.  He was on a librium taper.  UA showed possible UTI.  Cx grew staph epidermidis.  Ct head negative.  MRI neg.  CXR normal.  Ammonia levels normal.  He improved with IVF, IV Rocephin.  Completed librium taper.   Went to rehab on 6/3 for weakness and confusion related to alcohol abuse.  He was able to walk with a walker.  He complained of poor sleep, but no other complaints.   He had PT/OT.  His chronic medical problems were well controlled.  Condom cath removed.    He got home yesterday.  His strength is better and improving.  His speech is back to normal.     He needed another round of antibiotics (macrobid) for his UTI and finished that yesterday.  Culture was sensitive to cipro/levo, nitrofurantoin, ampicillin.    He is having difficulty sleeping.  Falls asleep easily, but wakes up after 3-4 hours and can not get back to sleep.  This is not new, but persistent.   Medications and allergies reviewed with patient and updated if appropriate.  Patient Active Problem List   Diagnosis Date Noted  . Acute metabolic encephalopathy 76/16/0737  . Acute lower UTI 04/18/2020  . Alcohol withdrawal ([place]) 04/18/2020  . Generalized weakness 04/18/2020  . Dehydration 04/18/2020  . Iron deficiency anemia 11/25/2019  . Difficulty sleeping 11/21/2019  . Hypomagnesemia 05/15/2019  . Alcoholism ([employer]) 05/14/2019  . Cough 10/31/2018  . Hyponatremia 10/30/2018  . Alcohol withdrawal seizure ([place]) 06/26/2018  . History of alcohol abuse 06/25/2018  . Leukocytosis 06/25/2018  . Persistent atrial fibrillation ([place]) 12/19/2017  . HFrEF (heart failure with reduced ejection fraction) ([place]) 12/04/2017  . Memory  difficulties 10/26/2017  . 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 Sensorineural hearing loss (SNHL), 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  . Prediabetes 04/07/2016  . Full thickness rotator cuff tear 04/23/2014  . Anticoagulation adequate, new on eliquis 03/07/2014  . SKIN CANCER, HX OF 03/24/2010  . DIVERTICULOSIS, COLON 12/11/2008  . SPINAL STENOSIS 01/14/2008  . BACK PAIN, LUMBAR, WITH RADICULOPATHY 01/02/2008  . Essential hypertension 10/31/2007  . 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 GERD 09/27/2007    Current Outpatient Medications on File Prior to Visit  Medication Sig Dispense Refill  . apixaban (ELIQUIS) 5 MG TABS tablet Take 1 tablet (5 mg total) by mouth 2 (two) times daily. 180 tablet 3  . cholecalciferol (VITAMIN D) 25 MCG (1000 UNIT) tablet TAKE 1 TABLET EVERY DAY 90 tablet 3  . Cholecalciferol (VITAMIN D-3) 25 MCG (1000 UT) CAPS Take 1 capsule (1,000 Units total) by mouth daily. 90 capsule 3  . dofetilide (TIKOSYN) 500 MCG capsule Take 1 capsule (500 mcg total) by mouth 2 (two) times daily. 106 capsule 3  . folic acid (FOLVITE) 1 MG tablet TAKE 1 TABLET EVERY DAY (Patient taking differently: Take 1 mg by mouth daily. ) 90 tablet 3  . furosemide (LASIX) 20 MG tablet TAKE 1 TABLET EVERY DAY (Patient taking differently: Take 20 mg by mouth daily. ) 90 tablet 2  . GNP VITAMIN B-1 100 MG tablet TAKE 1 TABLET EVERY DAY (Patient  taking differently: Take 100 mg by mouth daily. ) 90 tablet 3  . levETIRAcetam (KEPPRA) 500 MG tablet Take 1 tablet (500 mg total) by mouth 2 (two) times daily. 180 tablet 4  . magnesium oxide (MAG-OX) 400 MG tablet Take 1 tablet (400 mg total) by mouth daily. 90 tablet 3  . metoprolol tartrate (LOPRESSOR) 100 MG tablet Take 1 tablet (100 mg total) by mouth 2 (two) times daily. 180 tablet 3  . Multiple Vitamins-Minerals (ICAPS AREDS 2 PO) Take 1 capsule by mouth 2 (two) times daily.    . pantoprazole (PROTONIX) 40 MG tablet Take 1 tablet (40  mg total) by mouth daily at 6 (six) AM. 30 tablet 0  . quinapril (ACCUPRIL) 40 MG tablet Take 1 tablet (40 mg total) by mouth every morning. 90 tablet 3  . sodium chloride (OCEAN) 0.65 % SOLN nasal spray Place 1 spray into both nostrils as needed for congestion.    . vitamin B-12 (CYANOCOBALAMIN) 1000 MCG tablet TAKE 1 TABLET EVERY DAY (Patient taking differently: Take 1,000 mcg by mouth daily. ) 90 tablet 3  . magnesium oxide (MAG-OX) 400 (241.3 Mg) MG tablet TAKE 1 TABLET EVERY DAY (Patient not taking: Reported on 04/18/2020) 90 tablet 3   No current facility-administered medications on file prior to visit.    Past Medical History:  Diagnosis Date  . Diverticulosis 2006  . Erectile dysfunction   . GERD (gastroesophageal reflux disease)   . History of skin cancer   . History of syncope 1994   no etiology; no recurrence  . Hyperlipidemia    LDL goal = < 100, ideally < 70  . Hypertension   . Persistent atrial fibrillation ([place]) 03/06/2014  . PVC's (premature ventricular contractions)   . Rotator cuff tear   . Visit for monitoring Tikosyn therapy 12/19/2017    Past Surgical History:  Procedure Laterality Date  . CARDIOVERSION N/A 12/05/2017   Procedure: CARDIOVERSION;  Surgeon: Dorothy Spark, MD;  Location: [facility] ENDOSCOPY;  Service: Cardiovascular;  Laterality: N/A;  . COLONOSCOPY  2006   Diverticulosis  . CYSTECTOMY     lip  . CYSTECTOMY     anterior thorax-chest  . INGUINAL HERNIA REPAIR      x 2  . 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 SHOULDER OPEN ROTATOR CUFF REPAIR Right 04/23/2014   Procedure: RIGHT SHOULDER ROTATOR CUFF REPAIR WITH GRAFT AND ANCHORS . OPEN ACROMIONECTOMY;  Surgeon: Tobi Bastos, MD;  Location: [facility] ORS;  Service: Orthopedics;  Laterality: Right;  . SPINE SURGERY  2012   Dr Gladstone Lighter  . TEE WITHOUT  CARDIOVERSION N/A 12/05/2017   Procedure: TRANSESOPHAGEAL ECHOCARDIOGRAM (TEE);  Surgeon: Dorothy Spark, MD;  Location: [place];  Service: Cardiovascular;  Laterality: N/A;  . TONSILLECTOMY AND ADENOIDECTOMY      Social History   Socioeconomic History  . Marital status: Married    Spouse name: Not on file  . Number of children: 1  . Years of education: Not on file  . Highest education level: High school graduate  Occupational History  . Not on file  Tobacco Use  . Smoking status: Former Smoker    Packs/day: 1.00    Types: Cigarettes    Quit date: 1990    Years since quitting: 31.4  . Smokeless tobacco: Former Systems developer    Types: Loss adjuster, chartered  Quit date: 27  . Tobacco comment: smoked [place] with 12 year abstinence ( 21 total years of smoking), up to 1 ppd  Vaping Use  . Vaping Use: Never used  Substance and Sexual Activity  . Alcohol use: Not Currently    Comment: 09/26/18 NONE; previous 4 shots of alcohol a few days a week  . Drug use: Never  . Sexual activity: Not on file  Other Topics Concern  . Not on file  Social History Narrative   Lives at home with his wife   Works in his garden and walks 4 days per week   Right handed   Caffeine: minimal   Social Determinants of Radio broadcast assistant Strain:   . Difficulty of Paying Living Expenses:   Food Insecurity:   . Worried About Charity fundraiser in the Last Year:   . Arboriculturist in the Last Year:   Transportation Needs:   . Film/video editor (Medical):   Marland Kitchen Lack of Transportation (Non-Medical):   Physical Activity:   . Days of Exercise per Week:   . Minutes of Exercise per Session:   Stress:   . Feeling of Stress :   Social Connections:   . Frequency of Communication with Friends and Family:   . Frequency of Social Gatherings with Friends and Family:   . Attends Religious Services:   . Active Member of Clubs or Organizations:   . Attends Archivist Meetings:   Marland Kitchen Marital Status:      Family History  Problem Relation Age of Onset  . Heart attack Father 17  . Diabetes Mother   . Transient ischemic attack Mother 63  . Diabetes Sister   . Heart attack Sister 28  . Stomach cancer Maternal Grandfather   . Cirrhosis Paternal Uncle        alcoholic  . Alzheimer's disease Brother   . Colon cancer Neg Hx   . Colon polyps Neg Hx     Review of Systems  Constitutional: Negative for chills and fever.  Respiratory: Positive for cough (allergy related). Negative for shortness of breath and wheezing.   Cardiovascular: Positive for palpitations (with afib). Negative for chest pain and leg swelling.  Gastrointestinal: Negative for abdominal pain and nausea.  Genitourinary: Negative for dysuria, frequency and hematuria.       No cloudy urine.  Neurological: Negative for light-headedness and headaches.       Objective:   Vitals:   05/06/20 1329  BP: 124/76  Pulse: 74  Temp: 97.9 F (36.6 C)  SpO2: 96%   BP Readings from Last 3 Encounters:  05/06/20 124/76  04/23/20 (!) 142/83  04/15/20 97/63   Wt Readings from Last 3 Encounters:  05/06/20 192 lb (87.1 kg)  04/23/20 190 lb 0.6 oz (86.2 kg)  04/15/20 195 lb (88.5 kg)   Body mass index is 25.33 kg/m.   Physical Exam    Constitutional: Appears well-developed and well-nourished. No distress.  HENT:  Head: Normocephalic and atraumatic.  Neck: Neck supple. No tracheal deviation present. No thyromegaly present.  No cervical lymphadenopathy Cardiovascular: Normal rate, regular rhythm and normal heart sounds.   No carotid bruit .  No edema Pulmonary/Chest: Effort normal and breath sounds normal. No respiratory distress. No has no wheezes. No rales.  Abdomen; obese, umbilical hernia - reducible and nontender, no tenderness  Skin: Skin is warm and dry. Not diaphoretic.  Psychiatric: Normal mood and affect. Behavior is normal.  Assessment & Plan:    See Problem List for Assessment and Plan of chronic  medical problems.    This visit occurred during the SARS-CoV-2 public health emergency.  Safety protocols were in place, including screening questions prior to the visit, additional usage of staff PPE, and extensive cleaning of exam room while observing appropriate contact time as indicated for disinfecting solutions.

## 2020-05-06 ENCOUNTER — Other Ambulatory Visit: Payer: Self-pay

## 2020-05-06 ENCOUNTER — Encounter: Payer: Self-pay | Admitting: Internal Medicine

## 2020-05-06 ENCOUNTER — Ambulatory Visit (INDEPENDENT_AMBULATORY_CARE_PROVIDER_SITE_OTHER): Payer: Medicare Other | Admitting: Internal Medicine

## 2020-05-06 VITALS — BP 124/76 | HR 74 | Temp 97.9°F | Ht 73.0 in | Wt 192.0 lb

## 2020-05-06 DIAGNOSIS — F10230 Alcohol dependence with withdrawal, uncomplicated: Secondary | ICD-10-CM

## 2020-05-06 DIAGNOSIS — R569 Unspecified convulsions: Secondary | ICD-10-CM

## 2020-05-06 DIAGNOSIS — F10939 Alcohol use, unspecified with withdrawal, unspecified: Secondary | ICD-10-CM

## 2020-05-06 DIAGNOSIS — I10 Essential (primary) hypertension: Secondary | ICD-10-CM | POA: Diagnosis not present

## 2020-05-06 DIAGNOSIS — R531 Weakness: Secondary | ICD-10-CM

## 2020-05-06 DIAGNOSIS — F10239 Alcohol dependence with withdrawal, unspecified: Secondary | ICD-10-CM

## 2020-05-06 DIAGNOSIS — B952 Enterococcus as the cause of diseases classified elsewhere: Secondary | ICD-10-CM

## 2020-05-06 DIAGNOSIS — G479 Sleep disorder, unspecified: Secondary | ICD-10-CM

## 2020-05-06 DIAGNOSIS — G9341 Metabolic encephalopathy: Secondary | ICD-10-CM

## 2020-05-06 DIAGNOSIS — N390 Urinary tract infection, site not specified: Secondary | ICD-10-CM

## 2020-05-06 DIAGNOSIS — K219 Gastro-esophageal reflux disease without esophagitis: Secondary | ICD-10-CM

## 2020-05-06 MED ORDER — ESZOPICLONE 2 MG PO TABS: 2.0000 mg | ORAL_TABLET | Freq: Every evening | ORAL | 0 refills | Status: DC | PRN

## 2020-05-06 NOTE — Assessment & Plan Note (Signed)
Related to alcohol withdrawal and UTI Status post hospitalization and rehab Resolved

## 2020-05-06 NOTE — Assessment & Plan Note (Addendum)
Chronic Falls asleep easily but wakes after 3-4 hrs and can not get back to sleep Melatonin not effective trazodone not effective Trial of lunesta 2 mg - discussed side effects

## 2020-05-06 NOTE — Assessment & Plan Note (Signed)
No seizure activity during withdrawal Continue keppra at current

## 2020-05-06 NOTE — Patient Instructions (Addendum)
°  Urine tests was ordered.     Medications reviewed and updated.  Changes include :   lunesta 2 mg at bedtime for sleep  Your prescription(s) have been submitted to your pharmacy. Please take as directed and contact our office if you believe you are having problem(s) with the medication(s).    Please followup in 3 months-can cancel her July appointment

## 2020-05-06 NOTE — Assessment & Plan Note (Signed)
Chronic BP well controlled Current regimen effective and well tolerated Continue current medications at current doses  

## 2020-05-06 NOTE — Assessment & Plan Note (Signed)
Completed abx yesterday Needed >1 round of abx asymptomatic today Recheck UA, UCx

## 2020-05-06 NOTE — Assessment & Plan Note (Addendum)
Acute - started 3 weeks ago GERD rare ? Need it.  Start taking it every other day and taper slowly

## 2020-05-06 NOTE — Assessment & Plan Note (Signed)
Acute on chronic Has improved with PT/OT

## 2020-05-06 NOTE — Assessment & Plan Note (Addendum)
He signed for outpatient therapy three times a week and AA Stressed intense outpatient therapy for now - needs more than AA

## 2020-05-07 LAB — URINALYSIS, ROUTINE W REFLEX MICROSCOPIC
Bilirubin Urine: NEGATIVE
Hgb urine dipstick: NEGATIVE
Ketones, ur: NEGATIVE
Leukocytes,Ua: NEGATIVE
Nitrite: NEGATIVE
RBC / HPF: NONE SEEN (ref 0–?)
Specific Gravity, Urine: <=1.005 — AB (ref 1.000–1.030)
Total Protein, Urine: NEGATIVE
Urine Glucose: NEGATIVE
Urobilinogen, UA: 0.2 (ref 0.0–1.0)
pH: 6.5 (ref 5.0–8.0)

## 2020-05-07 LAB — URINE CULTURE: Result:: NO GROWTH

## 2020-05-13 ENCOUNTER — Ambulatory Visit: Payer: Medicare Other | Admitting: Internal Medicine

## 2020-05-17 ENCOUNTER — Encounter (HOSPITAL_COMMUNITY): Payer: Self-pay

## 2020-05-18 DIAGNOSIS — R972 Elevated prostate specific antigen [PSA]: Secondary | ICD-10-CM | POA: Diagnosis not present

## 2020-05-19 ENCOUNTER — Encounter: Payer: Self-pay | Admitting: Internal Medicine

## 2020-05-19 DIAGNOSIS — D509 Iron deficiency anemia, unspecified: Secondary | ICD-10-CM

## 2020-05-19 DIAGNOSIS — I10 Essential (primary) hypertension: Secondary | ICD-10-CM

## 2020-05-19 DIAGNOSIS — R7303 Prediabetes: Secondary | ICD-10-CM

## 2020-05-19 DIAGNOSIS — I4819 Other persistent atrial fibrillation: Secondary | ICD-10-CM

## 2020-05-20 ENCOUNTER — Other Ambulatory Visit: Payer: Medicare Other

## 2020-05-27 ENCOUNTER — Other Ambulatory Visit (HOSPITAL_COMMUNITY): Payer: Self-pay | Admitting: Nurse Practitioner

## 2020-05-27 ENCOUNTER — Ambulatory Visit: Payer: Medicare Other | Admitting: Internal Medicine

## 2020-06-02 ENCOUNTER — Other Ambulatory Visit: Payer: Self-pay | Admitting: Internal Medicine

## 2020-06-03 ENCOUNTER — Other Ambulatory Visit (HOSPITAL_COMMUNITY): Payer: Self-pay | Admitting: Nurse Practitioner

## 2020-06-22 ENCOUNTER — Ambulatory Visit: Payer: Medicare Other | Admitting: Internal Medicine

## 2020-07-06 ENCOUNTER — Other Ambulatory Visit: Payer: Self-pay | Admitting: Internal Medicine

## 2020-07-19 ENCOUNTER — Encounter: Payer: Self-pay | Admitting: Internal Medicine

## 2020-07-29 DIAGNOSIS — Z23 Encounter for immunization: Secondary | ICD-10-CM | POA: Diagnosis not present

## 2020-07-30 ENCOUNTER — Other Ambulatory Visit: Payer: Medicare Other

## 2020-07-30 ENCOUNTER — Other Ambulatory Visit: Payer: Self-pay

## 2020-07-30 DIAGNOSIS — I10 Essential (primary) hypertension: Secondary | ICD-10-CM | POA: Diagnosis not present

## 2020-07-30 DIAGNOSIS — D509 Iron deficiency anemia, unspecified: Secondary | ICD-10-CM | POA: Diagnosis not present

## 2020-07-30 DIAGNOSIS — R7303 Prediabetes: Secondary | ICD-10-CM | POA: Diagnosis not present

## 2020-07-30 DIAGNOSIS — I4819 Other persistent atrial fibrillation: Secondary | ICD-10-CM

## 2020-07-30 NOTE — Addendum Note (Signed)
Addended by: Cresenciano Lick on: 07/30/2020 09:57 AM   Modules accepted: Orders

## 2020-07-30 NOTE — Addendum Note (Signed)
Addended by: Cresenciano Lick on: 07/30/2020 09:58 AM   Modules accepted: Orders

## 2020-07-31 LAB — LIPID PANEL
Cholesterol: 173 mg/dL (ref <200)
HDL: 50 mg/dL (ref >=40)
LDL Cholesterol (Calc): 104 mg/dL (calc) — ABNORMAL HIGH
Non-HDL Cholesterol (Calc): 123 mg/dL (calc) (ref <130)
Total CHOL/HDL Ratio: 3.5 (calc) (ref <5.0)
Triglycerides: 91 mg/dL (ref <150)

## 2020-07-31 LAB — COMPREHENSIVE METABOLIC PANEL
AG Ratio: 1.6 (calc) (ref 1.0–2.5)
ALT: 85 U/L — ABNORMAL HIGH (ref 9–46)
AST: 45 U/L — ABNORMAL HIGH (ref 10–35)
Albumin: 3.8 g/dL (ref 3.6–5.1)
Alkaline phosphatase (APISO): 74 U/L (ref 35–144)
BUN/Creatinine Ratio: 7 (calc) (ref 6–22)
BUN: 6 mg/dL — ABNORMAL LOW (ref 7–25)
CO2: 31 mmol/L (ref 20–32)
Calcium: 9 mg/dL (ref 8.6–10.3)
Chloride: 104 mmol/L (ref 98–110)
Creat: 0.86 mg/dL (ref 0.70–1.18)
Globulin: 2.4 g/dL (calc) (ref 1.9–3.7)
Glucose, Bld: 90 mg/dL (ref 65–99)
Potassium: 4.5 mmol/L (ref 3.5–5.3)
Sodium: 142 mmol/L (ref 135–146)
Total Bilirubin: 0.5 mg/dL (ref 0.2–1.2)
Total Protein: 6.2 g/dL (ref 6.1–8.1)

## 2020-07-31 LAB — CBC WITH DIFFERENTIAL/PLATELET
Absolute Monocytes: 1098 cells/uL — ABNORMAL HIGH (ref 200–950)
Basophils Absolute: 63 cells/uL (ref 0–200)
Basophils Relative: 0.8 %
Eosinophils Absolute: 79 cells/uL (ref 15–500)
Eosinophils Relative: 1 %
HCT: 39.8 % (ref 38.5–50.0)
Hemoglobin: 12.9 g/dL — ABNORMAL LOW (ref 13.2–17.1)
Lymphs Abs: 1580 cells/uL (ref 850–3900)
MCH: 28.6 pg (ref 27.0–33.0)
MCHC: 32.4 g/dL (ref 32.0–36.0)
MCV: 88.2 fL (ref 80.0–100.0)
MPV: 9.6 fL (ref 7.5–12.5)
Monocytes Relative: 13.9 %
Neutro Abs: 5080 cells/uL (ref 1500–7800)
Neutrophils Relative %: 64.3 %
Platelets: 319 10*3/uL (ref 140–400)
RBC: 4.51 10*6/uL (ref 4.20–5.80)
RDW: 14 % (ref 11.0–15.0)
Total Lymphocyte: 20 %
WBC: 7.9 10*3/uL (ref 3.8–10.8)

## 2020-07-31 LAB — IRON,TIBC AND FERRITIN PANEL
%SAT: 26 % (calc) (ref 20–48)
Ferritin: 167 ng/mL (ref 24–380)
Iron: 75 ug/dL (ref 50–180)
TIBC: 284 mcg/dL (calc) (ref 250–425)

## 2020-07-31 LAB — HEMOGLOBIN A1C
Hgb A1c MFr Bld: 5.5 % of total Hgb (ref <5.7)
Mean Plasma Glucose: 111 (calc)
eAG (mmol/L): 6.2 (calc)

## 2020-07-31 LAB — TSH: TSH: 2.49 mIU/L (ref 0.40–4.50)

## 2020-07-31 LAB — MAGNESIUM: Magnesium: 2.2 mg/dL (ref 1.5–2.5)

## 2020-08-02 NOTE — Progress Notes (Signed)
Subjective:    Patient ID: Sergio Mack, male    DOB: 12-Feb-1946, 74 y.o.   MRN: [id number]  HPI The patient is here for follow up of their chronic medical problems, including Afib, HFrEF, htn, prediabetes, alcohol abuse, difficulty sleeping.  His wife is here with him.  He did start drinking alcohol again not long ago.  At that time his wife had contacted me via [place].  When asked today he states he is doing well with his drinking.   He has a lot of dry mouth during the morning.  Stool is hard in the morning and this turns to diarrhea later in the morning.  He drinks a lot of fluids.    He walks 4 days a week - 1.5-2 miles.     Medications and allergies reviewed with patient and updated if appropriate.  Patient Active Problem List   Diagnosis Date Noted  . Acute metabolic encephalopathy 20/94/7096  . Acute lower UTI 04/18/2020  . Alcohol withdrawal ([place]) 04/18/2020  . Generalized weakness 04/18/2020  . Dehydration 04/18/2020  . Iron deficiency anemia 11/25/2019  . Difficulty sleeping 11/21/2019  . Hypomagnesemia 05/15/2019  . Alcoholism ([place]) 05/14/2019  . Cough 10/31/2018  . Hyponatremia 10/30/2018  . Alcohol withdrawal seizure ([place]) 06/26/2018  . History of alcohol abuse 06/25/2018  . Leukocytosis 06/25/2018  . Persistent atrial fibrillation ([place]) 12/19/2017  . HFrEF (heart failure with reduced ejection fraction) ([place]) 12/04/2017  . Memory difficulties 10/26/2017  . 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 Sensorineural hearing loss (SNHL), 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  . Prediabetes 04/07/2016  . Full thickness rotator cuff tear 04/23/2014  . Anticoagulation adequate, new on eliquis 03/07/2014  . SKIN CANCER, HX OF 03/24/2010  . DIVERTICULOSIS, COLON 12/11/2008  . SPINAL STENOSIS 01/14/2008  . BACK PAIN, LUMBAR, WITH RADICULOPATHY 01/02/2008  . Essential hypertension 10/31/2007  . HYPERPLASIA PROSTATE UNS W/O UR OBST & OTH LUTS 10/31/2007    Current Outpatient  Medications on File Prior to Visit  Medication Sig Dispense Refill  . amLODipine (NORVASC) 2.5 MG tablet TAKE 1 TABLET DAILY. 90 tablet 1  . apixaban (ELIQUIS) 5 MG TABS tablet Take 1 tablet (5 mg total) by mouth 2 (two) times daily. 180 tablet 3  . cholecalciferol (VITAMIN D) 25 MCG (1000 UNIT) tablet TAKE 1 TABLET EVERY DAY 90 tablet 3  . dofetilide (TIKOSYN) 500 MCG capsule Take 1 capsule (500 mcg total) by mouth 2 (two) times daily. 180 capsule 3  . eszopiclone (LUNESTA) 2 MG TABS tablet TAKE 1 TABLET BY MOUTH AT BEDTIME AS NEEDED FOR SLEEP. TAKE IMMEDIATELY BEFORE BEDTIME 30 tablet 0  . folic acid (FOLVITE) 1 MG tablet TAKE 1 TABLET EVERY DAY (Patient taking differently: Take 1 mg by mouth daily. ) 90 tablet 3  . furosemide (LASIX) 20 MG tablet TAKE 1 TABLET EVERY DAY 90 tablet 2  . GNP VITAMIN B-1 100 MG tablet TAKE 1 TABLET EVERY DAY (Patient taking differently: Take 100 mg by mouth daily. ) 90 tablet 3  . magnesium oxide (MAG-OX) 400 MG tablet Take 1 tablet (400 mg total) by mouth daily. 90 tablet 3  . metoprolol tartrate (LOPRESSOR) 100 MG tablet Take 1 tablet (100 mg total) by mouth 2 (two) times daily. 180 tablet 3  . Multiple Vitamins-Minerals (ICAPS AREDS 2 PO) Take 1 capsule by mouth 2 (two) times daily.    . quinapril (ACCUPRIL) 40 MG tablet Take 1 tablet (40 mg total) by mouth every morning. 90 tablet 3  .  sodium chloride (OCEAN) 0.65 % SOLN nasal spray Place 1 spray into both nostrils as needed for congestion.    . vitamin B-12 (CYANOCOBALAMIN) 1000 MCG tablet TAKE 1 TABLET EVERY DAY (Patient taking differently: Take 1,000 mcg by mouth daily. ) 90 tablet 3   No current facility-administered medications on file prior to visit.    Past Medical History:  Diagnosis Date  . Diverticulosis 2006  . Erectile dysfunction   . GERD (gastroesophageal reflux disease)   . History of skin cancer   . History of syncope 1994   no etiology; no recurrence  . Hyperlipidemia    LDL goal = <  100, ideally < 70  . Hypertension   . Persistent atrial fibrillation ([place]) 03/06/2014  . PVC's (premature ventricular contractions)   . Rotator cuff tear   . Visit for monitoring Tikosyn therapy 12/19/2017    Past Surgical History:  Procedure Laterality Date  . CARDIOVERSION N/A 12/05/2017   Procedure: CARDIOVERSION;  Surgeon: Dorothy Spark, MD;  Location: [facility] ENDOSCOPY;  Service: Cardiovascular;  Laterality: N/A;  . COLONOSCOPY  2006   Diverticulosis  . CYSTECTOMY     lip  . CYSTECTOMY     anterior thorax-chest  . INGUINAL HERNIA REPAIR      x 2  . 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 SHOULDER OPEN ROTATOR CUFF REPAIR Right 04/23/2014   Procedure: RIGHT SHOULDER ROTATOR CUFF REPAIR WITH GRAFT AND ANCHORS . OPEN ACROMIONECTOMY;  Surgeon: Tobi Bastos, MD;  Location: [facility] ORS;  Service: Orthopedics;  Laterality: Right;  . SPINE SURGERY  2012   Dr Gladstone Lighter  . TEE WITHOUT CARDIOVERSION N/A 12/05/2017   Procedure: TRANSESOPHAGEAL ECHOCARDIOGRAM (TEE);  Surgeon: Dorothy Spark, MD;  Location: [place];  Service: Cardiovascular;  Laterality: N/A;  . TONSILLECTOMY AND ADENOIDECTOMY      Social History   Socioeconomic History  . Marital status: Married    Spouse name: Not on file  . Number of children: 1  . Years of education: Not on file  . Highest education level: High school graduate  Occupational History  . Not on file  Tobacco Use  . Smoking status: Former Smoker    Packs/day: 1.00    Types: Cigarettes    Quit date: 1990    Years since quitting: 31.7  . Smokeless tobacco: Former Systems developer    Types: Chew    Quit date: 57  . Tobacco comment: smoked [place] with 12 year abstinence ( 21 total years of smoking), up to 1 ppd  Vaping Use  . Vaping Use: Never used  Substance and Sexual Activity  . Alcohol use: Not  Currently    Comment: 09/26/18 NONE; previous 4 shots of alcohol a few days a week  . Drug use: Never  . Sexual activity: Not on file  Other Topics Concern  . Not on file  Social History Narrative   Lives at home with his wife   Works in his garden and walks 4 days per week   Right handed   Caffeine: minimal   Social Determinants of Radio broadcast assistant Strain:   . Difficulty of Paying Living Expenses: Not on file  Food Insecurity:   . Worried About Charity fundraiser in the Last Year: Not on file  . Ran Out of Food in the Last Year:  Not on file  Transportation Needs:   . Lack of Transportation (Medical): Not on file  . Lack of Transportation (Non-Medical): Not on file  Physical Activity:   . Days of Exercise per Week: Not on file  . Minutes of Exercise per Session: Not on file  Stress:   . Feeling of Stress : Not on file  Social Connections:   . Frequency of Communication with Friends and Family: Not on file  . Frequency of Social Gatherings with Friends and Family: Not on file  . Attends Religious Services: Not on file  . Active Member of Clubs or Organizations: Not on file  . Attends Archivist Meetings: Not on file  . Marital Status: Not on file    Family History  Problem Relation Age of Onset  . Heart attack Father 81  . Diabetes Mother   . Transient ischemic attack Mother 76  . Diabetes Sister   . Heart attack Sister 24  . Stomach cancer Maternal Grandfather   . Cirrhosis Paternal Uncle        alcoholic  . Alzheimer's disease Brother   . Colon cancer Neg Hx   . Colon polyps Neg Hx     Review of Systems  Constitutional: Negative for chills, fever and unexpected weight change.  HENT:       Dry mouth  Respiratory: Positive for cough (more with dry mouth). Negative for shortness of breath and wheezing.   Cardiovascular: Positive for leg swelling (if on feet a lot). Negative for chest pain and palpitations.  Neurological: Positive for  headaches. Negative for light-headedness.       Objective:   Vitals:   08/03/20 1030  BP: 130/72  Pulse: (!) 54  Temp: 97.9 F (36.6 C)  SpO2: 98%   BP Readings from Last 3 Encounters:  08/03/20 130/72  05/06/20 124/76  04/23/20 (!) 142/83   Wt Readings from Last 3 Encounters:  08/03/20 179 lb (81.2 kg)  05/06/20 192 lb (87.1 kg)  04/23/20 190 lb 0.6 oz (86.2 kg)   Body mass index is 23.62 kg/m.   Physical Exam    Constitutional: Appears well-developed and well-nourished. No distress.  HENT:  Head: Normocephalic and atraumatic.  Neck: Neck supple. No tracheal deviation present. No thyromegaly present.  No cervical lymphadenopathy Cardiovascular: Normal rate, irregular rhythm and normal heart sounds.   No murmur heard. No carotid bruit .  No edema Pulmonary/Chest: Effort normal and breath sounds normal. No respiratory distress. No has no wheezes. No rales.  Skin: Skin is warm and dry. Not diaphoretic.  Psychiatric: Normal mood and affect. Behavior is normal.      Assessment & Plan:    See Problem List for Assessment and Plan of chronic medical problems.    This visit occurred during the SARS-CoV-2 public health emergency.  Safety protocols were in place, including screening questions prior to the visit, additional usage of staff PPE, and extensive cleaning of exam room while observing appropriate contact time as indicated for disinfecting solutions.

## 2020-08-03 ENCOUNTER — Encounter: Payer: Self-pay | Admitting: Internal Medicine

## 2020-08-03 ENCOUNTER — Ambulatory Visit (INDEPENDENT_AMBULATORY_CARE_PROVIDER_SITE_OTHER): Payer: Medicare Other | Admitting: Internal Medicine

## 2020-08-03 ENCOUNTER — Other Ambulatory Visit: Payer: Self-pay

## 2020-08-03 VITALS — BP 130/72 | HR 54 | Temp 97.9°F | Wt 179.0 lb

## 2020-08-03 DIAGNOSIS — R569 Unspecified convulsions: Secondary | ICD-10-CM

## 2020-08-03 DIAGNOSIS — F10930 Alcohol use, unspecified with withdrawal, uncomplicated: Secondary | ICD-10-CM

## 2020-08-03 DIAGNOSIS — I4819 Other persistent atrial fibrillation: Secondary | ICD-10-CM

## 2020-08-03 DIAGNOSIS — R7303 Prediabetes: Secondary | ICD-10-CM

## 2020-08-03 DIAGNOSIS — G479 Sleep disorder, unspecified: Secondary | ICD-10-CM

## 2020-08-03 DIAGNOSIS — F1020 Alcohol dependence, uncomplicated: Secondary | ICD-10-CM | POA: Diagnosis not present

## 2020-08-03 DIAGNOSIS — I5020 Unspecified systolic (congestive) heart failure: Secondary | ICD-10-CM | POA: Diagnosis not present

## 2020-08-03 DIAGNOSIS — I10 Essential (primary) hypertension: Secondary | ICD-10-CM

## 2020-08-03 DIAGNOSIS — F10230 Alcohol dependence with withdrawal, uncomplicated: Secondary | ICD-10-CM

## 2020-08-03 MED ORDER — LEVETIRACETAM 500 MG PO TABS: 500.0000 mg | ORAL_TABLET | Freq: Two times a day (BID) | ORAL | 3 refills | Status: AC

## 2020-08-03 NOTE — Assessment & Plan Note (Addendum)
Chronic Taking keppra-has decided to stay on the medication Since he is stable no longer seeing neurology and I will prescribe the Keppra Continue current dose-refill sent to pharmacy

## 2020-08-03 NOTE — Assessment & Plan Note (Signed)
Chronic Appears euvolemic Continue current medications 

## 2020-08-03 NOTE — Patient Instructions (Addendum)
°  Medications reviewed and updated.  Changes include :   none  Your prescription(s) have been submitted to your pharmacy. Please take as directed and contact our office if you believe you are having problem(s) with the medication(s).    Please followup in 9  months

## 2020-08-03 NOTE — Assessment & Plan Note (Signed)
Sugars have been well controlled Continue low sugar/carbohydrate diet Continue regular exercise Follow-up in 6 months

## 2020-08-03 NOTE — Assessment & Plan Note (Signed)
Chronic BP well controlled Current regimen effective and well tolerated Continue current medications at current doses cmp  

## 2020-08-03 NOTE — Assessment & Plan Note (Addendum)
Chronic Taking lunesta as needed-does not feel the medication works great Can take as needed or ideally not at all

## 2020-08-03 NOTE — Assessment & Plan Note (Signed)
Chronic Persistent Following with cardiology

## 2020-08-03 NOTE — Assessment & Plan Note (Signed)
Chronic Has had intermittent alcohol use over the past few years Currently states he is doing well He understands the importance of abstinence

## 2020-08-06 ENCOUNTER — Encounter (HOSPITAL_COMMUNITY): Payer: Self-pay | Admitting: Nurse Practitioner

## 2020-08-06 ENCOUNTER — Ambulatory Visit (HOSPITAL_COMMUNITY)
Admission: RE | Admit: 2020-08-06 | Discharge: 2020-08-06 | Disposition: A | Discharge status: Home / Self Care | Payer: Medicare Other | Source: Ambulatory Visit | Attending: Nurse Practitioner | Admitting: Nurse Practitioner

## 2020-08-06 ENCOUNTER — Encounter (HOSPITAL_COMMUNITY): Payer: Self-pay

## 2020-08-06 ENCOUNTER — Other Ambulatory Visit: Payer: Self-pay

## 2020-08-06 VITALS — BP 100/58 | HR 63 | Ht 73.0 in | Wt 174.8 lb

## 2020-08-06 DIAGNOSIS — Z85828 Personal history of other malignant neoplasm of skin: Secondary | ICD-10-CM | POA: Insufficient documentation

## 2020-08-06 DIAGNOSIS — I4510 Unspecified right bundle-branch block: Secondary | ICD-10-CM | POA: Insufficient documentation

## 2020-08-06 DIAGNOSIS — Z87891 Personal history of nicotine dependence: Secondary | ICD-10-CM | POA: Diagnosis not present

## 2020-08-06 DIAGNOSIS — K219 Gastro-esophageal reflux disease without esophagitis: Secondary | ICD-10-CM | POA: Diagnosis not present

## 2020-08-06 DIAGNOSIS — K5790 Diverticulosis of intestine, part unspecified, without perforation or abscess without bleeding: Secondary | ICD-10-CM | POA: Diagnosis not present

## 2020-08-06 DIAGNOSIS — I4819 Other persistent atrial fibrillation: Secondary | ICD-10-CM | POA: Insufficient documentation

## 2020-08-06 DIAGNOSIS — E785 Hyperlipidemia, unspecified: Secondary | ICD-10-CM | POA: Diagnosis not present

## 2020-08-06 DIAGNOSIS — I10 Essential (primary) hypertension: Secondary | ICD-10-CM | POA: Insufficient documentation

## 2020-08-06 DIAGNOSIS — Z79899 Other long term (current) drug therapy: Secondary | ICD-10-CM | POA: Insufficient documentation

## 2020-08-06 DIAGNOSIS — Z7901 Long term (current) use of anticoagulants: Secondary | ICD-10-CM | POA: Diagnosis not present
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
## 2020-08-06 NOTE — Addendum Note (Signed)
Encounter addended by: Sherran Needs, NP on: 08/06/2020 2:41 PM  Actions taken: Clinical Note Signed

## 2020-08-13 ENCOUNTER — Ambulatory Visit: Payer: Medicare Other | Admitting: Internal Medicine

## 2020-08-26 ENCOUNTER — Other Ambulatory Visit (HOSPITAL_COMMUNITY): Payer: Self-pay | Admitting: Nurse Practitioner

## 2020-09-02 ENCOUNTER — Other Ambulatory Visit (HOSPITAL_COMMUNITY): Payer: Self-pay | Admitting: Nurse Practitioner

## 2020-09-07 DIAGNOSIS — Z23 Encounter for immunization: Secondary | ICD-10-CM | POA: Diagnosis not present

## 2020-09-16 ENCOUNTER — Other Ambulatory Visit (HOSPITAL_COMMUNITY): Payer: Self-pay | Admitting: Nurse Practitioner

## 2020-09-16 DIAGNOSIS — I10 Essential (primary) hypertension: Secondary | ICD-10-CM

## 2020-09-28 DIAGNOSIS — L570 Actinic keratosis: Secondary | ICD-10-CM | POA: Diagnosis not present

## 2020-09-30 ENCOUNTER — Other Ambulatory Visit (HOSPITAL_COMMUNITY): Payer: Self-pay | Admitting: Nurse Practitioner

## 2020-10-07 DIAGNOSIS — H353132 Nonexudative age-related macular degeneration, bilateral, intermediate dry stage: Secondary | ICD-10-CM | POA: Diagnosis not present

## 2020-10-16 ENCOUNTER — Other Ambulatory Visit (HOSPITAL_COMMUNITY): Payer: Self-pay | Admitting: Nurse Practitioner

## 2020-10-28 DIAGNOSIS — R972 Elevated prostate specific antigen [PSA]: Secondary | ICD-10-CM | POA: Diagnosis not present

## 2020-11-02 DIAGNOSIS — N400 Enlarged prostate without lower urinary tract symptoms: Secondary | ICD-10-CM | POA: Diagnosis not present

## 2020-11-02 DIAGNOSIS — L570 Actinic keratosis: Secondary | ICD-10-CM | POA: Diagnosis not present

## 2020-11-02 DIAGNOSIS — N5201 Erectile dysfunction due to arterial insufficiency: Secondary | ICD-10-CM | POA: Diagnosis not present

## 2020-12-08 ENCOUNTER — Other Ambulatory Visit: Payer: Self-pay | Admitting: Internal Medicine

## 2020-12-09 ENCOUNTER — Encounter: Payer: Self-pay | Admitting: Internal Medicine

## 2020-12-10 MED ORDER — TRAZODONE HCL 100 MG PO TABS: 100.0000 mg | ORAL_TABLET | Freq: Every day | ORAL | 1 refills | Status: AC

## 2020-12-10 NOTE — Addendum Note (Signed)
Addended by: Binnie Rail on: 12/10/2020 07:30 AM   Modules accepted: Orders

## 2020-12-18 ENCOUNTER — Telehealth: Payer: Self-pay | Admitting: Internal Medicine

## 2020-12-18 NOTE — Progress Notes (Signed)
  Chronic Care Management   Outreach Note  12/18/2020 Name: Sergio Mack MRN: [id number] DOB: July 30, 1946  Referred by: Binnie Rail, MD Reason for referral : No chief complaint on file.   An unsuccessful telephone outreach was attempted today. The patient was referred to the pharmacist for assistance with care management and care coordination.   Follow Up Plan:   Carley Perdue [employer] Scheduler

## 2020-12-18 NOTE — Progress Notes (Signed)
  Chronic Care Management   Note  12/18/2020 Name: Sergio Mack MRN: [id number] DOB: 10-30-1946  Sergio Mack is a 75 y.o. year old male who is a primary care patient of Quay Burow, Claudina Lick, MD. I reached out to Drema Dallas by phone today in response to a referral sent by Sergio Mack's PCP, Binnie Rail, MD.   [name] was given information about Chronic Care Management services today including:  1. CCM service includes personalized support from designated clinical staff supervised by his physician, including individualized plan of care and coordination with other care providers 2. 24/7 contact phone numbers for assistance for urgent and routine care needs. 3. Service will only be billed when office clinical staff spend 20 minutes or more in a month to coordinate care. 4. Only one practitioner may furnish and bill the service in a calendar month. 5. The patient may stop CCM services at any time (effective at the end of the month) by phone call to the office staff.   Patient agreed to services and verbal consent obtained.   Follow up plan:   Carley Perdue [employer] Scheduler

## 2021-01-13 ENCOUNTER — Other Ambulatory Visit (HOSPITAL_COMMUNITY): Payer: Self-pay | Admitting: Nurse Practitioner

## 2021-01-20 ENCOUNTER — Other Ambulatory Visit: Payer: Self-pay | Admitting: Internal Medicine

## 2021-01-27 ENCOUNTER — Telehealth: Payer: Self-pay | Admitting: Pharmacist
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
## 2021-01-28 ENCOUNTER — Ambulatory Visit: Payer: Medicare Other

## 2021-02-03 ENCOUNTER — Ambulatory Visit (HOSPITAL_COMMUNITY)
Admission: RE | Admit: 2021-02-03 | Discharge: 2021-02-03 | Disposition: A | Discharge status: Home / Self Care | Payer: Medicare Other | Source: Ambulatory Visit | Attending: Nurse Practitioner | Admitting: Nurse Practitioner

## 2021-02-03 ENCOUNTER — Encounter (HOSPITAL_COMMUNITY): Payer: Self-pay | Admitting: Nurse Practitioner

## 2021-02-03 ENCOUNTER — Other Ambulatory Visit: Payer: Self-pay

## 2021-02-03 VITALS — BP 114/70 | HR 63 | Ht 73.0 in | Wt 192.2 lb

## 2021-02-03 DIAGNOSIS — Z7901 Long term (current) use of anticoagulants: Secondary | ICD-10-CM | POA: Diagnosis not present

## 2021-02-03 DIAGNOSIS — Z8249 Family history of ischemic heart disease and other diseases of the circulatory system: Secondary | ICD-10-CM | POA: Diagnosis not present

## 2021-02-03 DIAGNOSIS — D6869 Other thrombophilia: Secondary | ICD-10-CM

## 2021-02-03 DIAGNOSIS — Z79899 Other long term (current) drug therapy: Secondary | ICD-10-CM | POA: Insufficient documentation

## 2021-02-03 DIAGNOSIS — I10 Essential (primary) hypertension: Secondary | ICD-10-CM | POA: Diagnosis not present

## 2021-02-03 DIAGNOSIS — Z87891 Personal history of nicotine dependence: Secondary | ICD-10-CM | POA: Insufficient documentation

## 2021-02-03 DIAGNOSIS — I4819 Other persistent atrial fibrillation: Secondary | ICD-10-CM | POA: Diagnosis not present

## 2021-02-03 DIAGNOSIS — Z886 Allergy status to analgesic agent status: Secondary | ICD-10-CM | POA: Diagnosis not present

## 2021-02-03 LAB — BASIC METABOLIC PANEL
Anion gap: 16 — ABNORMAL HIGH (ref 5–15)
BUN: 20 mg/dL (ref 8–23)
CO2: 24 mmol/L (ref 22–32)
Calcium: 8.5 mg/dL — ABNORMAL LOW (ref 8.9–10.3)
Chloride: 100 mmol/L (ref 98–111)
Creatinine, Ser: 1.5 mg/dL — ABNORMAL HIGH (ref 0.61–1.24)
GFR, Estimated: 48 mL/min — ABNORMAL LOW (ref >60)
Glucose, Bld: 103 mg/dL — ABNORMAL HIGH (ref 70–99)
Potassium: 4.2 mmol/L (ref 3.5–5.1)
Sodium: 140 mmol/L (ref 135–145)

## 2021-02-03 LAB — MAGNESIUM: Magnesium: 2.3 mg/dL (ref 1.7–2.4)
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
## 2021-02-10 ENCOUNTER — Other Ambulatory Visit (HOSPITAL_COMMUNITY): Payer: Medicare Other | Admitting: Nurse Practitioner

## 2021-02-12 ENCOUNTER — Ambulatory Visit (HOSPITAL_COMMUNITY)
Admission: RE | Admit: 2021-02-12 | Discharge: 2021-02-12 | Disposition: A | Discharge status: Home / Self Care | Payer: Medicare Other | Source: Ambulatory Visit | Attending: Nurse Practitioner | Admitting: Nurse Practitioner

## 2021-02-12 ENCOUNTER — Other Ambulatory Visit: Payer: Self-pay

## 2021-02-12 DIAGNOSIS — I4819 Other persistent atrial fibrillation: Secondary | ICD-10-CM | POA: Insufficient documentation

## 2021-02-12 DIAGNOSIS — I4510 Unspecified right bundle-branch block: Secondary | ICD-10-CM | POA: Diagnosis not present

## 2021-02-12 LAB — BASIC METABOLIC PANEL
Anion gap: 8 (ref 5–15)
BUN: 7 mg/dL — ABNORMAL LOW (ref 8–23)
CO2: 33 mmol/L — ABNORMAL HIGH (ref 22–32)
Calcium: 8.8 mg/dL — ABNORMAL LOW (ref 8.9–10.3)
Chloride: 93 mmol/L — ABNORMAL LOW (ref 98–111)
Creatinine, Ser: 1.01 mg/dL (ref 0.61–1.24)
GFR, Estimated: >60 mL/min (ref >60)
Glucose, Bld: 112 mg/dL — ABNORMAL HIGH (ref 70–99)
Potassium: 3.8 mmol/L (ref 3.5–5.1)
Sodium: 134 mmol/L — ABNORMAL LOW (ref 135–145)

## 2021-02-12 NOTE — Progress Notes (Signed)
Pt in for repeat bmet with increase of creatinine noted on last visit. Ekg repeated for qtc of 538 ms, today 517 ms. RBBB contributing. Dr. Rayann Heman reviewed on last visit and okay'ed continued use of tikosyn with the last qt, so improved today. Bmet will be reviewed for the cr cl cal and to assess correct  tikosyn dose when available.

## 2021-02-17 ENCOUNTER — Telehealth: Payer: Self-pay | Admitting: Internal Medicine

## 2021-02-17 NOTE — Telephone Encounter (Signed)
LVM for pt to rtn my call to schedule AWV with [facility]. Please schedule AWV if pt calls the office  

## 2021-02-18 DIAGNOSIS — H353132 Nonexudative age-related macular degeneration, bilateral, intermediate dry stage: Secondary | ICD-10-CM | POA: Diagnosis not present

## 2021-02-18 DIAGNOSIS — Z23 Encounter for immunization: Secondary | ICD-10-CM | POA: Diagnosis not present

## 2021-03-03 ENCOUNTER — Other Ambulatory Visit (HOSPITAL_COMMUNITY): Payer: Self-pay | Admitting: Nurse Practitioner

## 2021-03-08 ENCOUNTER — Other Ambulatory Visit (HOSPITAL_COMMUNITY): Payer: Self-pay | Admitting: Nurse Practitioner

## 2021-04-14 ENCOUNTER — Other Ambulatory Visit (HOSPITAL_COMMUNITY): Payer: Self-pay | Admitting: Nurse Practitioner

## 2021-04-26 ENCOUNTER — Telehealth: Payer: Self-pay | Admitting: Internal Medicine

## 2021-04-26 NOTE — Telephone Encounter (Signed)
Call his wife -- did he end up going someplace yesterday?

## 2021-04-26 NOTE — Telephone Encounter (Signed)
[facility] FYI 6.3.22:  ---Caller states her son recently passed and her husband has been experiencing heavy drinking since. He stopped but he now has hand tremors, anxiety, and withdrawal symptoms. Trouble breathing earlier. Crying a lot. Last drink 1000 today. Drinking heavy for two weeks, since losing his son. 4 1/2 gallons of vodka in the last 10 days  Call EMS 911

## 2021-04-27 ENCOUNTER — Emergency Department (HOSPITAL_COMMUNITY): Payer: Medicare Other

## 2021-04-27 ENCOUNTER — Other Ambulatory Visit: Payer: Self-pay

## 2021-04-27 ENCOUNTER — Encounter (HOSPITAL_COMMUNITY): Payer: Self-pay

## 2021-04-27 ENCOUNTER — Inpatient Hospital Stay (HOSPITAL_COMMUNITY)
Admission: EM | Admit: 2021-04-27 | Discharge: 2021-05-21 | DRG: 314 | Disposition: E | Payer: Medicare Other | Attending: Internal Medicine | Admitting: Internal Medicine

## 2021-04-27 DIAGNOSIS — F32A Depression, unspecified: Secondary | ICD-10-CM | POA: Diagnosis present

## 2021-04-27 DIAGNOSIS — Z888 Allergy status to other drugs, medicaments and biological substances status: Secondary | ICD-10-CM

## 2021-04-27 DIAGNOSIS — E872 Acidosis, unspecified: Secondary | ICD-10-CM

## 2021-04-27 DIAGNOSIS — R109 Unspecified abdominal pain: Secondary | ICD-10-CM

## 2021-04-27 DIAGNOSIS — I499 Cardiac arrhythmia, unspecified: Secondary | ICD-10-CM | POA: Diagnosis not present

## 2021-04-27 DIAGNOSIS — R000 Tachycardia, unspecified: Secondary | ICD-10-CM | POA: Diagnosis not present

## 2021-04-27 DIAGNOSIS — E162 Hypoglycemia, unspecified: Secondary | ICD-10-CM | POA: Diagnosis not present

## 2021-04-27 DIAGNOSIS — I9589 Other hypotension: Secondary | ICD-10-CM | POA: Diagnosis not present

## 2021-04-27 DIAGNOSIS — K922 Gastrointestinal hemorrhage, unspecified: Secondary | ICD-10-CM

## 2021-04-27 DIAGNOSIS — K219 Gastro-esophageal reflux disease without esophagitis: Secondary | ICD-10-CM | POA: Diagnosis present

## 2021-04-27 DIAGNOSIS — Z79899 Other long term (current) drug therapy: Secondary | ICD-10-CM

## 2021-04-27 DIAGNOSIS — K2970 Gastritis, unspecified, without bleeding: Secondary | ICD-10-CM

## 2021-04-27 DIAGNOSIS — Z634 Disappearance and death of family member: Secondary | ICD-10-CM

## 2021-04-27 DIAGNOSIS — D62 Acute posthemorrhagic anemia: Secondary | ICD-10-CM

## 2021-04-27 DIAGNOSIS — E861 Hypovolemia: Secondary | ICD-10-CM | POA: Diagnosis present

## 2021-04-27 DIAGNOSIS — I959 Hypotension, unspecified: Secondary | ICD-10-CM | POA: Diagnosis present

## 2021-04-27 DIAGNOSIS — Z833 Family history of diabetes mellitus: Secondary | ICD-10-CM

## 2021-04-27 DIAGNOSIS — E512 Wernicke's encephalopathy: Secondary | ICD-10-CM | POA: Diagnosis present

## 2021-04-27 DIAGNOSIS — Z85828 Personal history of other malignant neoplasm of skin: Secondary | ICD-10-CM

## 2021-04-27 DIAGNOSIS — Y907 Blood alcohol level of 200-239 mg/100 ml: Secondary | ICD-10-CM | POA: Diagnosis present

## 2021-04-27 DIAGNOSIS — I5032 Chronic diastolic (congestive) heart failure: Secondary | ICD-10-CM | POA: Diagnosis present

## 2021-04-27 DIAGNOSIS — E876 Hypokalemia: Secondary | ICD-10-CM | POA: Diagnosis not present

## 2021-04-27 DIAGNOSIS — Z20822 Contact with and (suspected) exposure to covid-19: Secondary | ICD-10-CM | POA: Diagnosis present

## 2021-04-27 DIAGNOSIS — I4510 Unspecified right bundle-branch block: Secondary | ICD-10-CM | POA: Diagnosis not present

## 2021-04-27 DIAGNOSIS — F1010 Alcohol abuse, uncomplicated: Secondary | ICD-10-CM

## 2021-04-27 DIAGNOSIS — R569 Unspecified convulsions: Secondary | ICD-10-CM | POA: Diagnosis present

## 2021-04-27 DIAGNOSIS — E161 Other hypoglycemia: Secondary | ICD-10-CM | POA: Diagnosis not present

## 2021-04-27 DIAGNOSIS — I110 Hypertensive heart disease with heart failure: Secondary | ICD-10-CM | POA: Diagnosis present

## 2021-04-27 DIAGNOSIS — I4819 Other persistent atrial fibrillation: Secondary | ICD-10-CM | POA: Diagnosis present

## 2021-04-27 DIAGNOSIS — Z87891 Personal history of nicotine dependence: Secondary | ICD-10-CM

## 2021-04-27 DIAGNOSIS — I4821 Permanent atrial fibrillation: Secondary | ICD-10-CM | POA: Diagnosis present

## 2021-04-27 DIAGNOSIS — E785 Hyperlipidemia, unspecified: Secondary | ICD-10-CM | POA: Diagnosis present

## 2021-04-27 DIAGNOSIS — R748 Abnormal levels of other serum enzymes: Secondary | ICD-10-CM | POA: Diagnosis present

## 2021-04-27 DIAGNOSIS — R443 Hallucinations, unspecified: Secondary | ICD-10-CM | POA: Diagnosis not present

## 2021-04-27 DIAGNOSIS — Z8249 Family history of ischemic heart disease and other diseases of the circulatory system: Secondary | ICD-10-CM

## 2021-04-27 DIAGNOSIS — Z7901 Long term (current) use of anticoagulants: Secondary | ICD-10-CM

## 2021-04-27 DIAGNOSIS — K921 Melena: Secondary | ICD-10-CM

## 2021-04-27 DIAGNOSIS — D696 Thrombocytopenia, unspecified: Secondary | ICD-10-CM

## 2021-04-27 DIAGNOSIS — R0600 Dyspnea, unspecified: Secondary | ICD-10-CM

## 2021-04-27 DIAGNOSIS — F1020 Alcohol dependence, uncomplicated: Secondary | ICD-10-CM | POA: Diagnosis present

## 2021-04-27 DIAGNOSIS — Z820 Family history of epilepsy and other diseases of the nervous system: Secondary | ICD-10-CM

## 2021-04-27 DIAGNOSIS — R410 Disorientation, unspecified: Secondary | ICD-10-CM | POA: Diagnosis present

## 2021-04-27 DIAGNOSIS — Z91013 Allergy to seafood: Secondary | ICD-10-CM

## 2021-04-27 DIAGNOSIS — N179 Acute kidney failure, unspecified: Secondary | ICD-10-CM | POA: Diagnosis not present

## 2021-04-27 DIAGNOSIS — K2981 Duodenitis with bleeding: Secondary | ICD-10-CM | POA: Diagnosis present

## 2021-04-27 DIAGNOSIS — K2270 Barrett's esophagus without dysplasia: Secondary | ICD-10-CM | POA: Diagnosis present

## 2021-04-27 DIAGNOSIS — I5020 Unspecified systolic (congestive) heart failure: Secondary | ICD-10-CM | POA: Diagnosis present

## 2021-04-27 DIAGNOSIS — K2921 Alcoholic gastritis with bleeding: Secondary | ICD-10-CM | POA: Diagnosis present

## 2021-04-27 DIAGNOSIS — R7303 Prediabetes: Secondary | ICD-10-CM | POA: Diagnosis present

## 2021-04-27 DIAGNOSIS — D6959 Other secondary thrombocytopenia: Secondary | ICD-10-CM | POA: Diagnosis present

## 2021-04-27 DIAGNOSIS — E860 Dehydration: Secondary | ICD-10-CM | POA: Diagnosis present

## 2021-04-27 DIAGNOSIS — F10139 Alcohol abuse with withdrawal, unspecified: Secondary | ICD-10-CM | POA: Diagnosis present

## 2021-04-27 DIAGNOSIS — Z66 Do not resuscitate: Secondary | ICD-10-CM | POA: Diagnosis present

## 2021-04-27 DIAGNOSIS — N1330 Unspecified hydronephrosis: Secondary | ICD-10-CM | POA: Diagnosis present

## 2021-04-27 DIAGNOSIS — R7989 Other specified abnormal findings of blood chemistry: Secondary | ICD-10-CM

## 2021-04-27 DIAGNOSIS — Z515 Encounter for palliative care: Secondary | ICD-10-CM

## 2021-04-27 DIAGNOSIS — K2991 Gastroduodenitis, unspecified, with bleeding: Secondary | ICD-10-CM | POA: Diagnosis present

## 2021-04-27 DIAGNOSIS — R7401 Elevation of levels of liver transaminase levels: Secondary | ICD-10-CM

## 2021-04-27 DIAGNOSIS — K2990 Gastroduodenitis, unspecified, without bleeding: Secondary | ICD-10-CM | POA: Diagnosis present

## 2021-04-27 DIAGNOSIS — J982 Interstitial emphysema: Secondary | ICD-10-CM

## 2021-04-27 DIAGNOSIS — K449 Diaphragmatic hernia without obstruction or gangrene: Secondary | ICD-10-CM | POA: Diagnosis present

## 2021-04-27 LAB — CBC WITH DIFFERENTIAL/PLATELET
Abs Immature Granulocytes: 0.07 10*3/uL (ref 0.00–0.07)
Basophils Absolute: 0 10*3/uL (ref 0.0–0.1)
Basophils Relative: 0 %
Eosinophils Absolute: 0 10*3/uL (ref 0.0–0.5)
Eosinophils Relative: 0 %
HCT: 35.1 % — ABNORMAL LOW (ref 39.0–52.0)
Hemoglobin: 11.4 g/dL — ABNORMAL LOW (ref 13.0–17.0)
Immature Granulocytes: 1 %
Lymphocytes Relative: 23 %
Lymphs Abs: 2.2 10*3/uL (ref 0.7–4.0)
MCH: 30.2 pg (ref 26.0–34.0)
MCHC: 32.5 g/dL (ref 30.0–36.0)
MCV: 93.1 fL (ref 80.0–100.0)
Monocytes Absolute: 0.5 10*3/uL (ref 0.1–1.0)
Monocytes Relative: 6 %
Neutro Abs: 6.4 10*3/uL (ref 1.7–7.7)
Neutrophils Relative %: 70 %
Platelets: 70 10*3/uL — ABNORMAL LOW (ref 150–400)
RBC: 3.77 MIL/uL — ABNORMAL LOW (ref 4.22–5.81)
RDW: 15.8 % — ABNORMAL HIGH (ref 11.5–15.5)
WBC: 9.2 10*3/uL (ref 4.0–10.5)
nRBC: 0 % (ref 0.0–0.2)

## 2021-04-27 LAB — HEPARIN LEVEL (UNFRACTIONATED): Heparin Unfractionated: >1.1 IU/mL — ABNORMAL HIGH (ref 0.30–0.70)

## 2021-04-27 LAB — URINALYSIS, ROUTINE W REFLEX MICROSCOPIC
Bilirubin Urine: NEGATIVE
Glucose, UA: NEGATIVE mg/dL
Ketones, ur: 20 mg/dL — AB
Nitrite: NEGATIVE
Protein, ur: 30 mg/dL — AB
Specific Gravity, Urine: 1.012 (ref 1.005–1.030)
pH: 6 (ref 5.0–8.0)

## 2021-04-27 LAB — TYPE AND SCREEN
ABO/RH(D): O POS
Antibody Screen: NEGATIVE

## 2021-04-27 LAB — PROTIME-INR
INR: 1.6 — ABNORMAL HIGH (ref 0.8–1.2)
Prothrombin Time: 19.3 seconds — ABNORMAL HIGH (ref 11.4–15.2)

## 2021-04-27 LAB — COMPREHENSIVE METABOLIC PANEL
ALT: 91 U/L — ABNORMAL HIGH (ref 0–44)
AST: 146 U/L — ABNORMAL HIGH (ref 15–41)
Albumin: 3.3 g/dL — ABNORMAL LOW (ref 3.5–5.0)
Alkaline Phosphatase: 51 U/L (ref 38–126)
Anion gap: 27 — ABNORMAL HIGH (ref 5–15)
BUN: 34 mg/dL — ABNORMAL HIGH (ref 8–23)
CO2: 15 mmol/L — ABNORMAL LOW (ref 22–32)
Calcium: 7.6 mg/dL — ABNORMAL LOW (ref 8.9–10.3)
Chloride: 92 mmol/L — ABNORMAL LOW (ref 98–111)
Creatinine, Ser: 2.17 mg/dL — ABNORMAL HIGH (ref 0.61–1.24)
GFR, Estimated: 31 mL/min — ABNORMAL LOW (ref >60)
Glucose, Bld: 70 mg/dL (ref 70–99)
Potassium: 3.8 mmol/L (ref 3.5–5.1)
Sodium: 134 mmol/L — ABNORMAL LOW (ref 135–145)
Total Bilirubin: 1.1 mg/dL (ref 0.3–1.2)
Total Protein: 5.7 g/dL — ABNORMAL LOW (ref 6.5–8.1)

## 2021-04-27 LAB — POC OCCULT BLOOD, ED: Fecal Occult Bld: NEGATIVE

## 2021-04-27 LAB — APTT: aPTT: 32 seconds (ref 24–36)

## 2021-04-27 LAB — I-STAT CHEM 8, ED
BUN: 31 mg/dL — ABNORMAL HIGH (ref 8–23)
Calcium, Ion: 0.91 mmol/L — ABNORMAL LOW (ref 1.15–1.40)
Chloride: 95 mmol/L — ABNORMAL LOW (ref 98–111)
Creatinine, Ser: 2.7 mg/dL — ABNORMAL HIGH (ref 0.61–1.24)
Glucose, Bld: 69 mg/dL — ABNORMAL LOW (ref 70–99)
HCT: 34 % — ABNORMAL LOW (ref 39.0–52.0)
Hemoglobin: 11.6 g/dL — ABNORMAL LOW (ref 13.0–17.0)
Potassium: 3.9 mmol/L (ref 3.5–5.1)
Sodium: 132 mmol/L — ABNORMAL LOW (ref 135–145)
TCO2: 17 mmol/L — ABNORMAL LOW (ref 22–32)

## 2021-04-27 LAB — ETHANOL: Alcohol, Ethyl (B): 211 mg/dL — ABNORMAL HIGH (ref <10)

## 2021-04-27 LAB — RESP PANEL BY RT-PCR (FLU A&B, COVID) ARPGX2
Influenza A by PCR: NEGATIVE
Influenza B by PCR: NEGATIVE
SARS Coronavirus 2 by RT PCR: NEGATIVE

## 2021-04-27 LAB — LACTIC ACID, PLASMA
Lactic Acid, Venous: 3.3 mmol/L (ref 0.5–1.9)
Lactic Acid, Venous: 3.2 mmol/L (ref 0.5–1.9)
Lactic Acid, Venous: 3.5 mmol/L (ref 0.5–1.9)

## 2021-04-27 MED ORDER — VITAMIN D 25 MCG (1000 UNIT) PO TABS
2000.0000 [IU] | ORAL_TABLET | Freq: Every day | ORAL | Status: DC
  Administered 2021-04-28 – 2021-05-02 (×4): 2000 [IU] via ORAL
  Filled 2021-04-27 (×4): qty 2

## 2021-04-27 MED ORDER — FERROUS SULFATE 325 (65 FE) MG PO TABS
325.0000 mg | ORAL_TABLET | Freq: Every day | ORAL | Status: DC
  Administered 2021-04-28 – 2021-05-02 (×4): 325 mg via ORAL
  Filled 2021-04-27 (×4): qty 1

## 2021-04-27 MED ORDER — VITAMIN B-12 1000 MCG PO TABS
1000.0000 ug | ORAL_TABLET | Freq: Every day | ORAL | Status: DC
  Administered 2021-04-28 – 2021-05-02 (×4): 1000 ug via ORAL
  Filled 2021-04-27 (×4): qty 1

## 2021-04-27 MED ORDER — THIAMINE HCL 100 MG PO TABS
100.0000 mg | ORAL_TABLET | Freq: Every day | ORAL | Status: DC
  Administered 2021-04-28 – 2021-05-02 (×4): 100 mg via ORAL
  Filled 2021-04-27 (×4): qty 1

## 2021-04-27 MED ORDER — CHOLECALCIFEROL 50 MCG (2000 UT) PO TABS: 2000.0000 [IU] | ORAL_TABLET | Freq: Every day | ORAL | Status: DC

## 2021-04-27 MED ORDER — EMPTY CONTAINERS FLEXIBLE MISC
900.0000 mg | Freq: Once | Status: AC
  Administered 2021-04-27: 900 mg via INTRAVENOUS
  Filled 2021-04-27: qty 90

## 2021-04-27 MED ORDER — FOLIC ACID 1 MG PO TABS
1.0000 mg | ORAL_TABLET | Freq: Every day | ORAL | Status: DC
  Administered 2021-04-27 – 2021-05-02 (×5): 1 mg via ORAL
  Filled 2021-04-27 (×5): qty 1

## 2021-04-27 MED ORDER — LORAZEPAM 1 MG PO TABS
1.0000 mg | ORAL_TABLET | ORAL | Status: DC | PRN
  Administered 2021-04-28 (×2): 1 mg via ORAL
  Administered 2021-05-02 (×2): 2 mg via ORAL
  Filled 2021-04-27: qty 2
  Filled 2021-04-27 (×2): qty 1
  Filled 2021-04-27: qty 2

## 2021-04-27 MED ORDER — PANTOPRAZOLE SODIUM 40 MG IV SOLR
40.0000 mg | Freq: Once | INTRAVENOUS | Status: AC
  Administered 2021-04-27: 40 mg via INTRAVENOUS
  Filled 2021-04-27: qty 40

## 2021-04-27 MED ORDER — LEVETIRACETAM 500 MG PO TABS
500.0000 mg | ORAL_TABLET | Freq: Two times a day (BID) | ORAL | Status: DC
  Administered 2021-04-27 – 2021-05-02 (×9): 500 mg via ORAL
  Filled 2021-04-27 (×9): qty 1

## 2021-04-27 MED ORDER — ADULT MULTIVITAMIN W/MINERALS CH
1.0000 | ORAL_TABLET | Freq: Every day | ORAL | Status: DC
  Administered 2021-04-27 – 2021-05-02 (×5): 1 via ORAL
  Filled 2021-04-27 (×5): qty 1

## 2021-04-27 MED ORDER — LORAZEPAM 2 MG/ML IJ SOLN
1.0000 mg | INTRAMUSCULAR | Status: DC | PRN
  Administered 2021-04-29 (×5): 1 mg via INTRAVENOUS
  Administered 2021-04-29 – 2021-04-30 (×2): 2 mg via INTRAVENOUS
  Administered 2021-04-30: 1 mg via INTRAVENOUS
  Administered 2021-04-30: 2 mg via INTRAVENOUS
  Administered 2021-04-30: 3 mg via INTRAVENOUS
  Administered 2021-04-30: 2 mg via INTRAVENOUS
  Administered 2021-04-30: 1 mg via INTRAVENOUS
  Administered 2021-04-30 – 2021-05-01 (×2): 2 mg via INTRAVENOUS
  Administered 2021-05-01 (×2): 3 mg via INTRAVENOUS
  Administered 2021-05-01 – 2021-05-03 (×11): 2 mg via INTRAVENOUS
  Filled 2021-04-27 (×24): qty 1
  Filled 2021-04-27: qty 2
  Filled 2021-04-27 (×3): qty 1

## 2021-04-27 MED ORDER — HEPARIN SODIUM (PORCINE) 5000 UNIT/ML IJ SOLN: 5000.0000 [IU] | Freq: Three times a day (TID) | INTRAMUSCULAR | Status: DC

## 2021-04-27 MED ORDER — LACTATED RINGERS IV SOLN
INTRAVENOUS | Status: AC
  Administered 2021-04-27: 1000 mL via INTRAVENOUS

## 2021-04-27 MED ORDER — THIAMINE HCL 100 MG/ML IJ SOLN
100.0000 mg | Freq: Every day | INTRAMUSCULAR | Status: DC
  Administered 2021-04-27: 100 mg via INTRAVENOUS
  Filled 2021-04-27 (×2): qty 2

## 2021-04-27 MED ORDER — SODIUM CHLORIDE 0.9 % IV BOLUS
3000.0000 mL | Freq: Once | INTRAVENOUS | Status: AC
  Administered 2021-04-27: 3000 mL via INTRAVENOUS

## 2021-04-27 MED ORDER — CHLORDIAZEPOXIDE HCL 25 MG PO CAPS
25.0000 mg | ORAL_CAPSULE | Freq: Once | ORAL | Status: AC
  Administered 2021-04-27: 25 mg via ORAL
  Filled 2021-04-27: qty 1

## 2021-04-27 NOTE — H&P (Addendum)
History and Physical    Sergio Mack WPY:[id number] DOB: 1946-02-12 DOA: 05/09/2021  PCP: Binnie Rail, MD  Patient coming from: Home  I have personally briefly reviewed patient's old medical records in [place]  Chief Complaint: heavy alcohol use  HPI: Sergio Mack is a 75 y.o. male with medical history significant for HFrEF (EF 65-70% 04/2018), HTN, persistent atrial fibrillation on Eliquis, history of alcoholic withdrawal seizures, and prediabetes who was brought in by his wife for concerns of heavy alcohol use.  Wife is not at bedside at this time.  Patient reports that he started drinking for the past 2 weeks after his son passed away.  He states because of his son's death is still unknown.  He has been drinking up to half a gallon of vodka every 2 days.  Has not had much to eat or drink.  No nausea, vomiting abdominal pain.  Has had diarrhea and noted to be dark.  States he takes iron supplementation.  He reports that he continues to take his medication including Eliquis and his antiepileptic. Last alcoholic drink this morning.  ED Course: He was hypotensive down to 80/52 which improved to systolic of 053 after 3 liters of IV fluid.  Fecal occult blood test was confirmed to be positive by ED physician Dr. Roderic Palau and patient was given Andexxa for concerns of massive GI bleed initially prior to labs returning. Later hemoglobin found to be stable at 11.4.  Platelet of 70.  Sodium 132.  AKI of 2.7 from prior 1.09.  Glucose of 69.  Lactate of 3.3.  AST of 126.  ALT of 91.  Gap of 21.  Alcohol level 211.  Review of Systems: Constitutional: No Weight Change, No Fever ENT/Mouth: No sore throat, No Rhinorrhea Eyes: No Eye Pain, No Vision Changes Cardiovascular: No Chest Pain, no SOB Respiratory: No Cough, No Sputum Gastrointestinal: No Nausea, No Vomiting, + Diarrhea, No Constipation, No Pain Genitourinary: no Urinary Incontinence Musculoskeletal: No Arthralgias, No  Myalgias Skin: No Skin Lesions, No Pruritus, Neuro: no Weakness, No Numbness Psych: No Anxiety/Panic, + Depression, no decrease appetite Heme/Lymph: No Bruising, No Bleeding  Past Medical History:  Diagnosis Date  . Diverticulosis 2006  . Erectile dysfunction   . GERD (gastroesophageal reflux disease)   . History of skin cancer   . History of syncope 1994   no etiology; no recurrence  . Hyperlipidemia    LDL goal = < 100, ideally < 70  . Hypertension   . Persistent atrial fibrillation ([place]) 03/06/2014  . PVC's (premature ventricular contractions)   . Rotator cuff tear   . Visit for monitoring Tikosyn therapy 12/19/2017    Past Surgical History:  Procedure Laterality Date  . CARDIOVERSION N/A 12/05/2017   Procedure: CARDIOVERSION;  Surgeon: Dorothy Spark, MD;  Location: [facility] ENDOSCOPY;  Service: Cardiovascular;  Laterality: N/A;  . COLONOSCOPY  2006   Diverticulosis  . CYSTECTOMY     lip  . CYSTECTOMY     anterior thorax-chest  . INGUINAL HERNIA REPAIR      x 2  . 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 SHOULDER OPEN ROTATOR CUFF REPAIR Right 04/23/2014   Procedure: RIGHT SHOULDER ROTATOR CUFF REPAIR WITH GRAFT AND ANCHORS . OPEN ACROMIONECTOMY;  Surgeon: Tobi Bastos, MD;  Location: [facility] ORS;  Service: Orthopedics;  Laterality: Right;  .  SPINE SURGERY  2012   Dr Gladstone Lighter  . TEE WITHOUT CARDIOVERSION N/A 12/05/2017   Procedure: TRANSESOPHAGEAL ECHOCARDIOGRAM (TEE);  Surgeon: Dorothy Spark, MD;  Location: [facility] ENDOSCOPY;  Service: Cardiovascular;  Laterality: N/A;  . TONSILLECTOMY AND ADENOIDECTOMY       reports that he quit smoking about 32 years ago. His smoking use included cigarettes. He smoked 1.00 pack per day. He quit smokeless tobacco use about 32 years ago.  His smokeless tobacco use included chew. He reports  current alcohol use. He reports that he does not use drugs. Social History  Allergies  Allergen Reactions  . Glucosamine Itching and Other (See Comments)    Itching feet and palms, tightness in chest also  . Shellfish-Derived Products Anaphylaxis, Shortness Of Breath and Itching    Itching feet and palms, tightness in chest also  Can eat oysters, can do dyes for tests and can eat just a few shrimp    Family History  Problem Relation Age of Onset  . Heart attack Father 5  . Diabetes Mother   . Transient ischemic attack Mother 20  . Diabetes Sister   . Heart attack Sister 52  . Stomach cancer Maternal Grandfather   . Cirrhosis Paternal Uncle        alcoholic  . Alzheimer's disease Brother   . Colon cancer Neg Hx   . Colon polyps Neg Hx      Prior to Admission medications   Medication Sig Start Date End Date Taking? Authorizing Provider  acetaminophen (TYLENOL) 500 MG tablet Take 500 mg by mouth every 6 (six) hours as needed for mild pain, fever or headache.   Yes [provider]  amLODipine (NORVASC) 2.5 MG tablet TAKE 1 TABLET EVERY DAY Patient taking differently: Take 2.5 mg by mouth daily. 03/09/21  Yes Sherran Needs, NP  Cholecalciferol (D3) 50 MCG (2000 UT) TABS Take 2,000 Units by mouth daily.   Yes [provider]  dofetilide (TIKOSYN) 500 MCG capsule TAKE 1 CAPSULE TWICE DAILY Patient taking differently: Take 500 mcg by mouth 2 (two) times daily. 04/14/21  Yes Sherran Needs, NP  ELIQUIS 5 MG TABS tablet TAKE 1 TABLET TWICE DAILY Patient taking differently: Take 5 mg by mouth 2 (two) times daily. 08/26/20  Yes Sherran Needs, NP  ferrous sulfate 325 (65 FE) MG tablet Take 325 mg by mouth daily with breakfast.   Yes [provider]  folic acid (FOLVITE) 1 MG tablet TAKE 1 TABLET EVERY DAY Patient taking differently: Take 1 mg by mouth daily. 01/21/21  Yes Burns, Claudina Lick, MD  furosemide (LASIX) 20 MG tablet TAKE 1 TABLET EVERY DAY Patient  taking differently: Take 20 mg by mouth daily. 01/13/21  Yes Sherran Needs, NP  GNP VITAMIN B-1 100 MG tablet TAKE 1 TABLET EVERY DAY Patient taking differently: Take 100 mg by mouth daily. 01/21/21  Yes Burns, Claudina Lick, MD  levETIRAcetam (KEPPRA) 500 MG tablet Take 1 tablet (500 mg total) by mouth 2 (two) times daily. 08/03/20  Yes Burns, Claudina Lick, MD  magnesium oxide (MAG-OX) 400 MG tablet Take 1 tablet (400 mg total) by mouth daily. 05/15/19  Yes Burns, Claudina Lick, MD  metoprolol tartrate (LOPRESSOR) 100 MG tablet TAKE 1 TABLET (100 MG TOTAL) BY MOUTH 2 (TWO) TIMES DAILY. 03/04/21  Yes Sherran Needs, NP  Multiple Vitamins-Minerals (ICAPS AREDS 2 PO) Take 1 capsule by mouth 2 (two) times daily.   Yes [provider]  quinapril (  ACCUPRIL) 40 MG tablet TAKE 1 TABLET (40 MG TOTAL) BY MOUTH EVERY MORNING. 09/17/20  Yes Sherran Needs, NP  sodium chloride (OCEAN) 0.65 % SOLN nasal spray Place 1 spray into both nostrils as needed for congestion.   Yes [provider]  traZODone (DESYREL) 100 MG tablet Take 1 tablet (100 mg total) by mouth at bedtime. Patient taking differently: Take 100 mg by mouth at bedtime as needed for sleep. 12/10/20  Yes Burns, Claudina Lick, MD  vitamin B-12 (CYANOCOBALAMIN) 1000 MCG tablet TAKE 1 TABLET EVERY DAY Patient taking differently: Take 1,000 mcg by mouth daily. 01/21/21  Yes Burns, Claudina Lick, MD  zinc gluconate 50 MG tablet Take 50 mg by mouth daily as needed (cuts/bruises).   Yes [provider]  cholecalciferol (VITAMIN D) 25 MCG (1000 UNIT) tablet TAKE 1 TABLET EVERY DAY Patient not taking: Reported on 05/08/2021 01/21/21   Binnie Rail, MD    Physical Exam: Vitals:   05/11/2021 1800 05/12/2021 1900 05/15/2021 1930 04/22/2021 2000  BP: 99/73 117/74 110/64 124/68  Pulse: 74 79 76 78  Resp: 16 13 15 18   Temp:      TempSrc:      SpO2: 100% 97% 98% 99%  Weight:      Height:        Constitutional: NAD, calm, comfortable, fatigue appearing elderly male  laying in bed asleep Vitals:   05/07/2021 1800 04/30/2021 1900 05/12/2021 1930 04/21/2021 2000  BP: 99/73 117/74 110/64 124/68  Pulse: 74 79 76 78  Resp: 16 13 15 18   Temp:      TempSrc:      SpO2: 100% 97% 98% 99%  Weight:      Height:       Eyes: PERRL, lids and conjunctivae normal ENMT: Mucous membranes are moist. Neck: normal, supple Respiratory: clear to auscultation bilaterally, no wheezing, no crackles. Normal respiratory effort. No accessory muscle use.  Cardiovascular: Regular rate and rhythm, no murmurs / rubs / gallops. No extremity edema.  Abdomen: no tenderness, no masses palpated. No hepatosplenomegaly. Bowel sounds positive.  Musculoskeletal: no clubbing / cyanosis. No joint deformity upper and lower extremities. Good ROM, no contractures. Normal muscle tone.  Skin: no rashes, lesions, ulcers. No induration Neurologic: CN 2-12 grossly intact. Sensation intact,  Strength 5/5 in all 4.  Psychiatric: Normal judgment and insight. Alert and oriented x 3. Depressed mood.    Labs on Admission: I have personally reviewed following labs and imaging studies  CBC: Recent Labs  Lab 05/18/2021 1605 05/02/2021 1701  WBC 9.2  --   NEUTROABS 6.4  --   HGB 11.4* 11.6*  HCT 35.1* 34.0*  MCV 93.1  --   PLT 70*  --    Basic Metabolic Panel: Recent Labs  Lab 05/20/2021 1605 04/23/2021 1701  NA 134* 132*  K 3.8 3.9  CL 92* 95*  CO2 15*  --   GLUCOSE 70 69*  BUN 34* 31*  CREATININE 2.17* 2.70*  CALCIUM 7.6*  --    GFR: Estimated Creatinine Clearance: 26.7 mL/min (A) (by C-G formula based on SCr of 2.7 mg/dL (H)). Liver Function Tests: Recent Labs  Lab 05/05/2021 1605  AST 146*  ALT 91*  ALKPHOS 51  BILITOT 1.1  PROT 5.7*  ALBUMIN 3.3*   No results for input(s): LIPASE, AMYLASE in the last 168 hours. No results for input(s): AMMONIA in the last 168 hours. Coagulation Profile: Recent Labs  Lab 05/01/2021 1605  INR 1.6*   Cardiac  Enzymes: No results for input(s): CKTOTAL,  CKMB, CKMBINDEX, TROPONINI in the last 168 hours. BNP (last 3 results) No results for input(s): PROBNP in the last 8760 hours. HbA1C: No results for input(s): HGBA1C in the last 72 hours. CBG: No results for input(s): GLUCAP in the last 168 hours. Lipid Profile: No results for input(s): CHOL, HDL, LDLCALC, TRIG, CHOLHDL, LDLDIRECT in the last 72 hours. Thyroid Function Tests: No results for input(s): TSH, T4TOTAL, FREET4, T3FREE, THYROIDAB in the last 72 hours. Anemia Panel: No results for input(s): VITAMINB12, FOLATE, FERRITIN, TIBC, IRON, RETICCTPCT in the last 72 hours. Urine analysis:    Component Value Date/Time   COLORURINE YELLOW 05/06/2020 1409   APPEARANCEUR CLEAR 05/06/2020 1409   APPEARANCEUR Clear 06/26/2018 1101   LABSPEC <=1.005 (A) 05/06/2020 1409   PHURINE 6.5 05/06/2020 1409   GLUCOSEU NEGATIVE 05/06/2020 1409   HGBUR NEGATIVE 05/06/2020 1409   BILIRUBINUR NEGATIVE 05/06/2020 1409   BILIRUBINUR Negative 06/26/2018 1101   KETONESUR NEGATIVE 05/06/2020 1409   PROTEINUR NEGATIVE 04/18/2020 1057   UROBILINOGEN 0.2 05/06/2020 1409   NITRITE NEGATIVE 05/06/2020 1409   LEUKOCYTESUR NEGATIVE 05/06/2020 1409    Radiological Exams on Admission: DG Chest Port 1 View  Result Date: 05/18/2021 CLINICAL DATA:  Possible sepsis.  Alcohol abuse.  Hypotension. EXAM: PORTABLE CHEST 1 VIEW COMPARISON:  04/18/2020 FINDINGS: Interval patient rotation to the left. No gross cardiac enlargement. Stable mild pleural and parenchymal scarring at the left lung base. Minimal linear atelectasis or scarring at the right lung base. Thoracic spine degenerative changes. IMPRESSION: No acute abnormality. Electronically Signed   By: Claudie Revering M.D.   On: 05/12/2021 16:28      Assessment/Plan  Hypotension secondary to hypovolemia -Improved after 3 L of IV normal saline -Continuous IV LR 75 cc/h for 12 hours - hold all home antihypertensives  Heavy alcohol use w/ hx of alcohol withdrawal  seizure -Patient started drinking after death of son 2 weeks ago.  States he drinks about half a gallon of vodka every 2 days and has history of alcohol withdrawal seizure on antiepileptic -Give 25 mg Librium -CIWA protocol  -TOCM consult for resources on cessation/grief counseling  -Continue home antiepileptic  AKI -Creatinine of 2.70. Pre-renal from hypovolemia - repeat and monitor after IV fluids -Avoid nephrotoxic agent  Thrombocytopenia/transaminitis -Secondary to heavy alcohol use  Metabolic acidosis secondary to alcohol use -Continue monitor with IV fluids  Acute GI bleed Patient notes dark stools on iron supplementation however confirmed by ED physician to be fecal occult blood positive (results documented in epic was incorrect) -Suspect secondary to heavy alcohol use -Hemoglobin stable but given Andexxa by ED physician for concerns of massive bleed with hypotension prior to lab resulting. -Has history of adenomatous polyps.  Last colonoscopy on 11/2018 with multiple polyps and diverticula scattered throughout.No endoscopy found on record -GI consult in the morning  HFrEF  EF 65-70% 04/2018 Monitor fluid status closely with continuous IV fluids  Permanent  atrial fibrillation Eliquis - Hold Eliquis for now for concerns of GI bleed -Hold Dofetilide due to decreased creatinine clearance  DVT prophylaxis:.SCDs Code Status: Full Family Communication: Plan discussed with patient at bedside  disposition Plan: Home with observation Consults called:  Admission status: Observation  Level of care: Progressive  Status is: Observation  The patient remains OBS appropriate and will d/c before 2 midnights.  Dispo: The patient is from: Home              Anticipated d/c is to:  Home              Patient currently is not medically stable to d/c.   Difficult to place patient No         Orene Desanctis DO [facility]   If 7PM-7AM, please contact  night-coverage [email or web address]   04/30/2021, 8:31 PM

## 2021-04-27 NOTE — Telephone Encounter (Signed)
He really needs to establish with an addiction specialist.  I have a patient doing an online program he may consider  -   [email or web address]     -  They are helping him through withdraw from alcohol

## 2021-04-27 NOTE — ED Notes (Signed)
Patient is unable to give a urine sample at this time.

## 2021-04-27 NOTE — Sepsis Progress Note (Signed)
Code Sepsis protocol being monitored by e[employer]. 

## 2021-04-27 NOTE — ED Notes (Signed)
Patient is eating applesauce and drinking apple juice. Patient needs soft foods because he dose not have his dentures.

## 2021-04-27 NOTE — ED Notes (Signed)
Andexxa is complete. Gave 400mg  LD then 500mg  for total of 900mg  over 2.25 Hr.

## 2021-04-27 NOTE — ED Triage Notes (Signed)
Pt coming from home- brought in by EMS d/t alcohol abuse. Wife told EMS, pt's son passed away 2 weeks ago, and since then he's just been drinking, not eating or cleaning himself. Pt was hypotensive w EMS- given 1L NS- sbp in 80s. EMS states pt denies SI, but feels depressed. Pt has skin tear on rt hand, bandaged by EMS

## 2021-04-27 NOTE — Progress Notes (Signed)
Still waiting for LA results. CHL showing it was collected @ 19:52 PM and resulted at 20:04 PM, but not showing yet.

## 2021-04-27 NOTE — ED Notes (Signed)
Ambulated patient to the bathroom using wheelchair. Patient was continent of urine and stool,

## 2021-04-27 NOTE — ED Provider Notes (Signed)
[facility] DEPT Provider Note   CSN: [id number] Arrival date & time: 04/21/2021  1548     History Chief Complaint  Patient presents with  . Depression  . Hypotension    Sergio Mack is a 75 y.o. male.  According to the patient's wife the patient has been drinking continuously since about 04/14/23 when her son died.  He has a history of alcohol abuse and feels weak now.  He takes Xarelto for atrial fibs and for the last 2 days he has been having black stools  The history is provided by the patient, medical records and a significant other. No language interpreter was used.  Weakness Severity:  Moderate Onset quality:  Sudden Timing:  Constant Progression:  Worsening Chronicity:  New Context: alcohol use   Relieved by:  Nothing Worsened by:  Nothing Ineffective treatments:  None tried Associated symptoms: no abdominal pain, no chest pain, no cough, no diarrhea, no frequency, no headaches and no seizures        Past Medical History:  Diagnosis Date  . Diverticulosis 2006  . Erectile dysfunction   . GERD (gastroesophageal reflux disease)   . History of skin cancer   . History of syncope 1994   no etiology; no recurrence  . Hyperlipidemia    LDL goal = < 100, ideally < 70  . Hypertension   . Persistent atrial fibrillation ([place]) 03/06/2014  . PVC's (premature ventricular contractions)   . Rotator cuff tear   . Visit for monitoring Tikosyn therapy 12/19/2017    Patient Active Problem List   Diagnosis Date Noted  . Acute metabolic encephalopathy 92/09/9416  . Acute lower UTI 04/18/2020  . Alcohol withdrawal ([place]) 04/18/2020  . Generalized weakness 04/18/2020  . Dehydration 04/18/2020  . Iron deficiency anemia 11/25/2019  . Difficulty sleeping 11/21/2019  . Hypomagnesemia 05/15/2019  . Alcoholism ([place]) 05/14/2019  . Cough 10/31/2018  . Hyponatremia 10/30/2018  . Alcohol withdrawal seizure ([place]) 06/26/2018  . History of alcohol abuse  06/25/2018  . Leukocytosis 06/25/2018  . Persistent atrial fibrillation ([place]) 12/19/2017  . HFrEF (heart failure with reduced ejection fraction) ([place]) 12/04/2017  . Memory difficulties 10/26/2017  . 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 Sensorineural hearing loss (SNHL), 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  . Prediabetes 04/07/2016  . Full thickness rotator cuff tear 04/23/2014  . Anticoagulation adequate, new on eliquis 03/07/2014  . SKIN CANCER, HX OF 03/24/2010  . DIVERTICULOSIS, COLON 12/11/2008  . SPINAL STENOSIS 01/14/2008  . BACK PAIN, LUMBAR, WITH RADICULOPATHY 01/02/2008  . Essential hypertension 10/31/2007  . HYPERPLASIA PROSTATE UNS W/O UR OBST & OTH LUTS 10/31/2007    Past Surgical History:  Procedure Laterality Date  . CARDIOVERSION N/A 12/05/2017   Procedure: CARDIOVERSION;  Surgeon: Dorothy Spark, MD;  Location: [facility] ENDOSCOPY;  Service: Cardiovascular;  Laterality: N/A;  . COLONOSCOPY  2006   Diverticulosis  . CYSTECTOMY     lip  . CYSTECTOMY     anterior thorax-chest  . INGUINAL HERNIA REPAIR      x 2  . 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 SHOULDER OPEN ROTATOR CUFF REPAIR Right 04/23/2014   Procedure: RIGHT SHOULDER ROTATOR CUFF REPAIR WITH GRAFT AND ANCHORS . OPEN ACROMIONECTOMY;  Surgeon: Tobi Bastos, MD;  Location: [facility] ORS;  Service: Orthopedics;  Laterality: Right;  . [place] SURGERY  2012  Dr Gladstone Lighter  . TEE WITHOUT CARDIOVERSION N/A 12/05/2017   Procedure: TRANSESOPHAGEAL ECHOCARDIOGRAM (TEE);  Surgeon: Dorothy Spark, MD;  Location: [facility] ENDOSCOPY;  Service: Cardiovascular;  Laterality: N/A;  . TONSILLECTOMY AND ADENOIDECTOMY         Family History  Problem Relation Age of Onset  . Heart attack Father 11  . Diabetes Mother   . Transient ischemic attack Mother 2  . Diabetes Sister   . Heart attack  Sister 66  . Stomach cancer Maternal Grandfather   . Cirrhosis Paternal Uncle        alcoholic  . Alzheimer's disease Brother   . Colon cancer Neg Hx   . Colon polyps Neg Hx     Social History   Tobacco Use  . Smoking status: Former Smoker    Packs/day: 1.00    Types: Cigarettes    Quit date: 1990    Years since quitting: 32.4  . Smokeless tobacco: Former Systems developer    Types: Chew    Quit date: 47  . Tobacco comment: smoked [place] with 12 year abstinence ( 21 total years of smoking), up to 1 ppd  Vaping Use  . Vaping Use: Never used  Substance Use Topics  . Alcohol use: Yes    Comment: 3 bottles of liquor a day  . Drug use: Never    Home Medications Prior to Admission medications   Medication Sig Start Date End Date Taking? Authorizing Provider  acetaminophen (TYLENOL) 500 MG tablet Take 500 mg by mouth every 6 (six) hours as needed for mild pain, fever or headache.   Yes [provider]  amLODipine (NORVASC) 2.5 MG tablet TAKE 1 TABLET EVERY DAY Patient taking differently: Take 2.5 mg by mouth daily. 03/09/21  Yes Sherran Needs, NP  Cholecalciferol (D3) 50 MCG (2000 UT) TABS Take 2,000 Units by mouth daily.   Yes [provider]  dofetilide (TIKOSYN) 500 MCG capsule TAKE 1 CAPSULE TWICE DAILY Patient taking differently: Take 500 mcg by mouth 2 (two) times daily. 04/14/21  Yes Sherran Needs, NP  ELIQUIS 5 MG TABS tablet TAKE 1 TABLET TWICE DAILY Patient taking differently: Take 5 mg by mouth 2 (two) times daily. 08/26/20  Yes Sherran Needs, NP  ferrous sulfate 325 (65 FE) MG tablet Take 325 mg by mouth daily with breakfast.   Yes [provider]  folic acid (FOLVITE) 1 MG tablet TAKE 1 TABLET EVERY DAY Patient taking differently: Take 1 mg by mouth daily. 01/21/21  Yes Burns, Claudina Lick, MD  furosemide (LASIX) 20 MG tablet TAKE 1 TABLET EVERY DAY Patient taking differently: Take 20 mg by mouth daily. 01/13/21  Yes Sherran Needs, NP  GNP  VITAMIN B-1 100 MG tablet TAKE 1 TABLET EVERY DAY Patient taking differently: Take 100 mg by mouth daily. 01/21/21  Yes Burns, Claudina Lick, MD  levETIRAcetam (KEPPRA) 500 MG tablet Take 1 tablet (500 mg total) by mouth 2 (two) times daily. 08/03/20  Yes Burns, Claudina Lick, MD  magnesium oxide (MAG-OX) 400 MG tablet Take 1 tablet (400 mg total) by mouth daily. 05/15/19  Yes Burns, Claudina Lick, MD  metoprolol tartrate (LOPRESSOR) 100 MG tablet TAKE 1 TABLET (100 MG TOTAL) BY MOUTH 2 (TWO) TIMES DAILY. 03/04/21  Yes Sherran Needs, NP  Multiple Vitamins-Minerals (ICAPS AREDS 2 PO) Take 1 capsule by mouth 2 (two) times daily.   Yes [provider]  quinapril (ACCUPRIL) 40 MG tablet TAKE 1 TABLET (40 MG  TOTAL) BY MOUTH EVERY MORNING. 09/17/20  Yes Sherran Needs, NP  sodium chloride (OCEAN) 0.65 % SOLN nasal spray Place 1 spray into both nostrils as needed for congestion.   Yes [provider]  traZODone (DESYREL) 100 MG tablet Take 1 tablet (100 mg total) by mouth at bedtime. Patient taking differently: Take 100 mg by mouth at bedtime as needed for sleep. 12/10/20  Yes Burns, Claudina Lick, MD  vitamin B-12 (CYANOCOBALAMIN) 1000 MCG tablet TAKE 1 TABLET EVERY DAY Patient taking differently: Take 1,000 mcg by mouth daily. 01/21/21  Yes Burns, Claudina Lick, MD  zinc gluconate 50 MG tablet Take 50 mg by mouth daily as needed (cuts/bruises).   Yes [provider]  cholecalciferol (VITAMIN D) 25 MCG (1000 UNIT) tablet TAKE 1 TABLET EVERY DAY Patient not taking: Reported on 05/02/2021 01/21/21   Binnie Rail, MD    Allergies    Glucosamine and Shellfish-derived products  Review of Systems   Review of Systems  Constitutional: Negative for appetite change and fatigue.  HENT: Negative for congestion, ear discharge and sinus pressure.   Eyes: Negative for discharge.  Respiratory: Negative for cough.   Cardiovascular: Negative for chest pain.  Gastrointestinal: Negative for abdominal pain and diarrhea.   Genitourinary: Negative for frequency and hematuria.  Musculoskeletal: Negative for back pain.  Skin: Negative for rash.  Neurological: Positive for weakness. Negative for seizures and headaches.  Psychiatric/Behavioral: Negative for hallucinations.    Physical Exam Updated Vital Signs BP 110/64   Pulse 76   Temp 98.1 F (36.7 C) (Oral)   Resp 15   Ht 6\' 1"  (1.854 m)   Wt 81.6 kg   SpO2 98%   BMI 23.75 kg/m   Physical Exam Vitals and nursing note reviewed.  Constitutional:      Appearance: He is well-developed.  HENT:     Head: Normocephalic.     Nose: Nose normal.  Eyes:     General: No scleral icterus.    Conjunctiva/sclera: Conjunctivae normal.  Neck:     Thyroid: No thyromegaly.  Cardiovascular:     Rate and Rhythm: Normal rate and regular rhythm.     Heart sounds: No murmur heard. No friction rub. No gallop.   Pulmonary:     Breath sounds: No stridor. No wheezing or rales.  Chest:     Chest wall: No tenderness.  Abdominal:     General: There is no distension.     Tenderness: There is no abdominal tenderness. There is no rebound.  Genitourinary:    Comments: Rectal exam shows black stool that is heme positive Musculoskeletal:        General: Normal range of motion.     Cervical back: Neck supple.  Lymphadenopathy:     Cervical: No cervical adenopathy.  Skin:    Findings: No erythema or rash.  Neurological:     Mental Status: He is alert and oriented to person, place, and time.     Motor: No abnormal muscle tone.     Coordination: Coordination normal.  Psychiatric:        Behavior: Behavior normal.     ED Results / Procedures / Treatments   Labs (all labs ordered are listed, but only abnormal results are displayed) Labs Reviewed  LACTIC ACID, PLASMA - Abnormal; Notable for the following components:      Result Value   Lactic Acid, Venous 3.3 (*)    All other components within normal limits  COMPREHENSIVE METABOLIC PANEL -  Abnormal; Notable for  the following components:   Sodium 134 (*)    Chloride 92 (*)    CO2 15 (*)    BUN 34 (*)    Creatinine, Ser 2.17 (*)    Calcium 7.6 (*)    Total Protein 5.7 (*)    Albumin 3.3 (*)    AST 146 (*)    ALT 91 (*)    GFR, Estimated 31 (*)    Anion gap 27 (*)    All other components within normal limits  CBC WITH DIFFERENTIAL/PLATELET - Abnormal; Notable for the following components:   RBC 3.77 (*)    Hemoglobin 11.4 (*)    HCT 35.1 (*)    RDW 15.8 (*)    Platelets 70 (*)    All other components within normal limits  PROTIME-INR - Abnormal; Notable for the following components:   Prothrombin Time 19.3 (*)    INR 1.6 (*)    All other components within normal limits  ETHANOL - Abnormal; Notable for the following components:   Alcohol, Ethyl (B) 211 (*)    All other components within normal limits  HEPARIN LEVEL (UNFRACTIONATED) - Abnormal; Notable for the following components:   Heparin Unfractionated >1.10 (*)    All other components within normal limits  I-STAT CHEM 8, ED - Abnormal; Notable for the following components:   Sodium 132 (*)    Chloride 95 (*)    BUN 31 (*)    Creatinine, Ser 2.70 (*)    Glucose, Bld 69 (*)    Calcium, Ion 0.91 (*)    TCO2 17 (*)    Hemoglobin 11.6 (*)    HCT 34.0 (*)    All other components within normal limits  RESP PANEL BY RT-PCR (FLU A&B, COVID) ARPGX2  CULTURE, BLOOD (ROUTINE X 2)  CULTURE, BLOOD (ROUTINE X 2)  URINE CULTURE  APTT  LACTIC ACID, PLASMA  URINALYSIS, ROUTINE W REFLEX MICROSCOPIC  OCCULT BLOOD X 1 CARD TO LAB, STOOL  POC OCCULT BLOOD, ED  TYPE AND SCREEN    EKG None  Radiology DG Chest Port 1 View  Result Date: 04/28/2021 CLINICAL DATA:  Possible sepsis.  Alcohol abuse.  Hypotension. EXAM: PORTABLE CHEST 1 VIEW COMPARISON:  04/18/2020 FINDINGS: Interval patient rotation to the left. No gross cardiac enlargement. Stable mild pleural and parenchymal scarring at the left lung base. Minimal linear atelectasis or  scarring at the right lung base. Thoracic spine degenerative changes. IMPRESSION: No acute abnormality. Electronically Signed   By: Claudie Revering M.D.   On: 05/05/2021 16:28    Procedures Procedures   Medications Ordered in ED Medications  sodium chloride 0.9 % bolus 3,000 mL (0 mLs Intravenous Stopped 04/28/2021 1814)  pantoprazole (PROTONIX) injection 40 mg (40 mg Intravenous Given 04/28/2021 1707)  coag fact Xa recombinant (ANDEXXA) low dose infusion 900 mg (900 mg Intravenous New Bag/Given 05/13/2021 1815)    ED Course  I have reviewed the triage vital signs and the nursing notes.  Pertinent labs & imaging results that were available during my care of the patient were reviewed by me and considered in my medical decision making (see chart for details). CRITICAL CARE Performed by: Milton Ferguson Total critical care time: 50 minutes Critical care time was exclusive of separately billable procedures and treating other patients. Critical care was necessary to treat or prevent imminent or life-threatening deterioration. Critical care was time spent personally by me on the following activities: development of treatment plan with patient and/or  surrogate as well as nursing, discussions with consultants, evaluation of patient's response to treatment, examination of patient, obtaining history from patient or surrogate, ordering and performing treatments and interventions, ordering and review of laboratory studies, ordering and review of radiographic studies, pulse oximetry and re-evaluation of patient's condition. Patient had a heme positive stool and hypotension.  He was on Xarelto.  Patient was given a reversal agent.  His blood pressure improved with IV fluids it appears that the patient's hypotension [place] is more related to dehydration and alcohol abuse.  His hemoglobin did drop a small amount.   MDM Rules/Calculators/A&P                          Patient with AKI upper GI bleed and dehydration he will  be admitted to medicine Final Clinical Impression(s) / ED Diagnoses Final diagnoses:  AKI (acute kidney injury) ([place])  Upper GI bleed    Rx / DC Orders ED Discharge Orders    None       Milton Ferguson, MD 04/24/2021 2005

## 2021-04-27 NOTE — Telephone Encounter (Signed)
  Follow up message  Please call spouse, she states patient is still consuming alcohol and has stopped speaking.  Spouse given information to contact addiction specialist.  She is requesting call back

## 2021-04-27 NOTE — ED Notes (Signed)
Brought pt warm blanket

## 2021-04-27 NOTE — ED Notes (Addendum)
Per MD ZAMMIT occult blood was positive.

## 2021-04-27 NOTE — Telephone Encounter (Signed)
   Spouse called back, states she has called EMS for patient

## 2021-04-27 NOTE — Progress Notes (Signed)
ELINK Code Sepsis Completion Note:  Pt LA only down to 3.2 from 3.3. BC sent but ABX was not ordered by MD. Had required IVF bolus replacement. Being admitted to Progressive Unit for further monitoring.  Solmon Ice Zuhayr Deeney DNP eLink RN [phone] PM

## 2021-04-27 NOTE — Sepsis Progress Note (Signed)
Notified provider of need to order antibiotics.   

## 2021-04-28 ENCOUNTER — Other Ambulatory Visit: Payer: Self-pay

## 2021-04-28 DIAGNOSIS — I517 Cardiomegaly: Secondary | ICD-10-CM | POA: Diagnosis not present

## 2021-04-28 DIAGNOSIS — E512 Wernicke's encephalopathy: Secondary | ICD-10-CM | POA: Diagnosis not present

## 2021-04-28 DIAGNOSIS — K922 Gastrointestinal hemorrhage, unspecified: Secondary | ICD-10-CM | POA: Diagnosis present

## 2021-04-28 DIAGNOSIS — K2921 Alcoholic gastritis with bleeding: Secondary | ICD-10-CM | POA: Diagnosis not present

## 2021-04-28 DIAGNOSIS — R0600 Dyspnea, unspecified: Secondary | ICD-10-CM | POA: Diagnosis not present

## 2021-04-28 DIAGNOSIS — Z20822 Contact with and (suspected) exposure to covid-19: Secondary | ICD-10-CM | POA: Diagnosis not present

## 2021-04-28 DIAGNOSIS — K7689 Other specified diseases of liver: Secondary | ICD-10-CM | POA: Diagnosis not present

## 2021-04-28 DIAGNOSIS — I5032 Chronic diastolic (congestive) heart failure: Secondary | ICD-10-CM | POA: Diagnosis present

## 2021-04-28 DIAGNOSIS — I4821 Permanent atrial fibrillation: Secondary | ICD-10-CM | POA: Diagnosis present

## 2021-04-28 DIAGNOSIS — R531 Weakness: Secondary | ICD-10-CM | POA: Diagnosis not present

## 2021-04-28 DIAGNOSIS — R109 Unspecified abdominal pain: Secondary | ICD-10-CM | POA: Diagnosis not present

## 2021-04-28 DIAGNOSIS — I9589 Other hypotension: Principal | ICD-10-CM

## 2021-04-28 DIAGNOSIS — I4819 Other persistent atrial fibrillation: Secondary | ICD-10-CM | POA: Diagnosis not present

## 2021-04-28 DIAGNOSIS — K2289 Other specified disease of esophagus: Secondary | ICD-10-CM | POA: Diagnosis not present

## 2021-04-28 DIAGNOSIS — E860 Dehydration: Secondary | ICD-10-CM | POA: Diagnosis present

## 2021-04-28 DIAGNOSIS — R7989 Other specified abnormal findings of blood chemistry: Secondary | ICD-10-CM

## 2021-04-28 DIAGNOSIS — K921 Melena: Secondary | ICD-10-CM

## 2021-04-28 DIAGNOSIS — F1010 Alcohol abuse, uncomplicated: Secondary | ICD-10-CM

## 2021-04-28 DIAGNOSIS — J189 Pneumonia, unspecified organism: Secondary | ICD-10-CM | POA: Diagnosis not present

## 2021-04-28 DIAGNOSIS — Z043 Encounter for examination and observation following other accident: Secondary | ICD-10-CM | POA: Diagnosis not present

## 2021-04-28 DIAGNOSIS — F10139 Alcohol abuse with withdrawal, unspecified: Secondary | ICD-10-CM | POA: Diagnosis present

## 2021-04-28 DIAGNOSIS — Z515 Encounter for palliative care: Secondary | ICD-10-CM | POA: Diagnosis not present

## 2021-04-28 DIAGNOSIS — K449 Diaphragmatic hernia without obstruction or gangrene: Secondary | ICD-10-CM | POA: Diagnosis not present

## 2021-04-28 DIAGNOSIS — R9431 Abnormal electrocardiogram [ECG] [EKG]: Secondary | ICD-10-CM | POA: Diagnosis not present

## 2021-04-28 DIAGNOSIS — E785 Hyperlipidemia, unspecified: Secondary | ICD-10-CM | POA: Diagnosis present

## 2021-04-28 DIAGNOSIS — Z5181 Encounter for therapeutic drug level monitoring: Secondary | ICD-10-CM | POA: Diagnosis not present

## 2021-04-28 DIAGNOSIS — K2970 Gastritis, unspecified, without bleeding: Secondary | ICD-10-CM | POA: Diagnosis not present

## 2021-04-28 DIAGNOSIS — Y907 Blood alcohol level of 200-239 mg/100 ml: Secondary | ICD-10-CM | POA: Diagnosis present

## 2021-04-28 DIAGNOSIS — D6959 Other secondary thrombocytopenia: Secondary | ICD-10-CM | POA: Diagnosis present

## 2021-04-28 DIAGNOSIS — N1330 Unspecified hydronephrosis: Secondary | ICD-10-CM | POA: Diagnosis not present

## 2021-04-28 DIAGNOSIS — Z634 Disappearance and death of family member: Secondary | ICD-10-CM | POA: Diagnosis not present

## 2021-04-28 DIAGNOSIS — Z66 Do not resuscitate: Secondary | ICD-10-CM | POA: Diagnosis not present

## 2021-04-28 DIAGNOSIS — E861 Hypovolemia: Secondary | ICD-10-CM

## 2021-04-28 DIAGNOSIS — K2991 Gastroduodenitis, unspecified, with bleeding: Secondary | ICD-10-CM | POA: Diagnosis not present

## 2021-04-28 DIAGNOSIS — D62 Acute posthemorrhagic anemia: Secondary | ICD-10-CM

## 2021-04-28 DIAGNOSIS — F32A Depression, unspecified: Secondary | ICD-10-CM | POA: Diagnosis present

## 2021-04-28 DIAGNOSIS — K2981 Duodenitis with bleeding: Secondary | ICD-10-CM | POA: Diagnosis not present

## 2021-04-28 DIAGNOSIS — Z7901 Long term (current) use of anticoagulants: Secondary | ICD-10-CM | POA: Diagnosis not present

## 2021-04-28 DIAGNOSIS — I480 Paroxysmal atrial fibrillation: Secondary | ICD-10-CM | POA: Diagnosis not present

## 2021-04-28 DIAGNOSIS — J90 Pleural effusion, not elsewhere classified: Secondary | ICD-10-CM | POA: Diagnosis not present

## 2021-04-28 DIAGNOSIS — E872 Acidosis: Secondary | ICD-10-CM | POA: Diagnosis not present

## 2021-04-28 DIAGNOSIS — R0609 Other forms of dyspnea: Secondary | ICD-10-CM | POA: Diagnosis not present

## 2021-04-28 DIAGNOSIS — Z7189 Other specified counseling: Secondary | ICD-10-CM | POA: Diagnosis not present

## 2021-04-28 DIAGNOSIS — K2980 Duodenitis without bleeding: Secondary | ICD-10-CM | POA: Diagnosis not present

## 2021-04-28 DIAGNOSIS — R443 Hallucinations, unspecified: Secondary | ICD-10-CM | POA: Diagnosis not present

## 2021-04-28 DIAGNOSIS — K3189 Other diseases of stomach and duodenum: Secondary | ICD-10-CM | POA: Diagnosis not present

## 2021-04-28 DIAGNOSIS — R569 Unspecified convulsions: Secondary | ICD-10-CM | POA: Diagnosis present

## 2021-04-28 DIAGNOSIS — N179 Acute kidney failure, unspecified: Secondary | ICD-10-CM | POA: Diagnosis not present

## 2021-04-28 DIAGNOSIS — I110 Hypertensive heart disease with heart failure: Secondary | ICD-10-CM | POA: Diagnosis present

## 2021-04-28 LAB — BASIC METABOLIC PANEL
Anion gap: >20 — ABNORMAL HIGH (ref 5–15)
Anion gap: 8 (ref 5–15)
BUN: 28 mg/dL — ABNORMAL HIGH (ref 8–23)
BUN: 17 mg/dL (ref 8–23)
CO2: 14 mmol/L — ABNORMAL LOW (ref 22–32)
CO2: 15 mmol/L — ABNORMAL LOW (ref 22–32)
Calcium: 7.3 mg/dL — ABNORMAL LOW (ref 8.9–10.3)
Calcium: 5.6 mg/dL — CL (ref 8.9–10.3)
Chloride: 100 mmol/L (ref 98–111)
Chloride: 114 mmol/L — ABNORMAL HIGH (ref 98–111)
Creatinine, Ser: 1.69 mg/dL — ABNORMAL HIGH (ref 0.61–1.24)
Creatinine, Ser: 1.03 mg/dL (ref 0.61–1.24)
GFR, Estimated: 42 mL/min — ABNORMAL LOW (ref >60)
GFR, Estimated: >60 mL/min (ref >60)
Glucose, Bld: 123 mg/dL — ABNORMAL HIGH (ref 70–99)
Glucose, Bld: 153 mg/dL — ABNORMAL HIGH (ref 70–99)
Potassium: 3.7 mmol/L (ref 3.5–5.1)
Potassium: 3.1 mmol/L — ABNORMAL LOW (ref 3.5–5.1)
Sodium: 136 mmol/L (ref 135–145)
Sodium: 137 mmol/L (ref 135–145)

## 2021-04-28 LAB — BLOOD GAS, VENOUS
Acid-base deficit: 12.8 mmol/L — ABNORMAL HIGH (ref 0.0–2.0)
Acid-base deficit: 14.3 mmol/L — ABNORMAL HIGH (ref 0.0–2.0)
Bicarbonate: 12 mmol/L — ABNORMAL LOW (ref 20.0–28.0)
Bicarbonate: 13 mmol/L — ABNORMAL LOW (ref 20.0–28.0)
O2 Saturation: 57.7 %
O2 Saturation: 85.3 %
Patient temperature: 98.6
Patient temperature: 98.6
pCO2, Ven: 29.8 mmHg — ABNORMAL LOW (ref 44.0–60.0)
pCO2, Ven: 30.6 mmHg — ABNORMAL LOW (ref 44.0–60.0)
pH, Ven: 7.227 — ABNORMAL LOW (ref 7.250–7.430)
pH, Ven: 7.251 (ref 7.250–7.430)
pO2, Ven: 35.4 mmHg (ref 32.0–45.0)
pO2, Ven: 55.4 mmHg — ABNORMAL HIGH (ref 32.0–45.0)

## 2021-04-28 LAB — BLOOD GAS, ARTERIAL
Acid-base deficit: 7.1 mmol/L — ABNORMAL HIGH (ref 0.0–2.0)
Bicarbonate: 15.8 mmol/L — ABNORMAL LOW (ref 20.0–28.0)
Drawn by: 331471
FIO2: 21
O2 Saturation: 99.7 %
Patient temperature: 98.6
pCO2 arterial: 24.6 mmHg — ABNORMAL LOW (ref 32.0–48.0)
pH, Arterial: 7.424 (ref 7.350–7.450)
pO2, Arterial: 160 mmHg — ABNORMAL HIGH (ref 83.0–108.0)

## 2021-04-28 LAB — CBC
HCT: 29.9 % — ABNORMAL LOW (ref 39.0–52.0)
HCT: 29.9 % — ABNORMAL LOW (ref 39.0–52.0)
Hemoglobin: 9.5 g/dL — ABNORMAL LOW (ref 13.0–17.0)
Hemoglobin: 10 g/dL — ABNORMAL LOW (ref 13.0–17.0)
MCH: 30.2 pg (ref 26.0–34.0)
MCH: 30.6 pg (ref 26.0–34.0)
MCHC: 31.8 g/dL (ref 30.0–36.0)
MCHC: 33.4 g/dL (ref 30.0–36.0)
MCV: 94.9 fL (ref 80.0–100.0)
MCV: 91.4 fL (ref 80.0–100.0)
Platelets: 58 10*3/uL — ABNORMAL LOW (ref 150–400)
Platelets: 52 10*3/uL — ABNORMAL LOW (ref 150–400)
RBC: 3.15 MIL/uL — ABNORMAL LOW (ref 4.22–5.81)
RBC: 3.27 MIL/uL — ABNORMAL LOW (ref 4.22–5.81)
RDW: 16.1 % — ABNORMAL HIGH (ref 11.5–15.5)
RDW: 15.8 % — ABNORMAL HIGH (ref 11.5–15.5)
WBC: 8.8 10*3/uL (ref 4.0–10.5)
WBC: 7.4 10*3/uL (ref 4.0–10.5)
nRBC: 0 % (ref 0.0–0.2)
nRBC: 0 % (ref 0.0–0.2)

## 2021-04-28 LAB — LACTIC ACID, PLASMA
Lactic Acid, Venous: 4.3 mmol/L (ref 0.5–1.9)
Lactic Acid, Venous: 4 mmol/L (ref 0.5–1.9)
Lactic Acid, Venous: 1.8 mmol/L (ref 0.5–1.9)
Lactic Acid, Venous: 1.9 mmol/L (ref 0.5–1.9)

## 2021-04-28 LAB — MAGNESIUM: Magnesium: 2.4 mg/dL (ref 1.7–2.4)

## 2021-04-28 MED ORDER — PANTOPRAZOLE SODIUM 40 MG IV SOLR: 40.0000 mg | Freq: Two times a day (BID) | INTRAVENOUS | Status: DC

## 2021-04-28 MED ORDER — CALCIUM GLUCONATE-NACL 1-0.675 GM/50ML-% IV SOLN
1.0000 g | Freq: Once | INTRAVENOUS | Status: AC
  Administered 2021-04-28: 1000 mg via INTRAVENOUS
  Filled 2021-04-28: qty 50

## 2021-04-28 MED ORDER — SODIUM CHLORIDE 0.9 % IV BOLUS
500.0000 mL | Freq: Once | INTRAVENOUS | Status: AC
  Administered 2021-04-28: 500 mL via INTRAVENOUS

## 2021-04-28 MED ORDER — LACTATED RINGERS IV SOLN: INTRAVENOUS | Status: AC

## 2021-04-28 MED ORDER — ACETAMINOPHEN 500 MG PO TABS
500.0000 mg | ORAL_TABLET | Freq: Once | ORAL | Status: AC
  Administered 2021-04-28: 500 mg via ORAL
  Filled 2021-04-28: qty 1

## 2021-04-28 MED ORDER — CARVEDILOL 3.125 MG PO TABS
3.1250 mg | ORAL_TABLET | Freq: Two times a day (BID) | ORAL | Status: DC
  Administered 2021-04-28 – 2021-04-30 (×2): 3.125 mg via ORAL
  Filled 2021-04-28 (×3): qty 1

## 2021-04-28 MED ORDER — SODIUM CHLORIDE 0.9 % IV SOLN
80.0000 mg | Freq: Once | INTRAVENOUS | Status: AC
  Administered 2021-04-28: 80 mg via INTRAVENOUS
  Filled 2021-04-28: qty 80

## 2021-04-28 MED ORDER — POTASSIUM CHLORIDE CRYS ER 20 MEQ PO TBCR
40.0000 meq | EXTENDED_RELEASE_TABLET | Freq: Once | ORAL | Status: AC
  Administered 2021-04-28: 40 meq via ORAL
  Filled 2021-04-28: qty 2

## 2021-04-28 MED ORDER — SODIUM CHLORIDE 0.9 % IV SOLN
8.0000 mg/h | INTRAVENOUS | Status: DC
  Administered 2021-04-28 – 2021-04-29 (×3): 8 mg/h via INTRAVENOUS
  Filled 2021-04-28 (×5): qty 80

## 2021-04-28 MED ORDER — SODIUM CHLORIDE 0.9 % IV SOLN
1.0000 g | Freq: Once | INTRAVENOUS | Status: AC
  Administered 2021-04-28: 1 g via INTRAVENOUS
  Filled 2021-04-28: qty 1

## 2021-04-28 NOTE — Consult Note (Addendum)
Cardiology Consultation:   Patient ID: Sergio Mack MRN: [id number]; DOB: Apr 12, 1946  Admit date: 05/05/2021 Date of Consult: 04/28/2021  PCP:  Binnie Rail, MD   [facility] Providers Cardiologist:  Thompson Grayer, MD        Patient Profile:   Sergio Mack is a 75 y.o. male with a hx of PAF, RBBB, HTN, HLD, prediabetes and a history of alcohol withdrawal who is being seen 04/28/2021 for the evaluation of prolonged QTc at the request of Dr. Rodena Piety.  History of Present Illness:   Mr. Sergio Mack is a 75 year old male with past medical history of PAF, RBBB, HTN, HLD, prediabetes and a history of alcohol withdrawal.  Myoview in April 2015 was low risk.  Due to recurrent atrial fibrillation, patient was placed on Tikosyn along with metoprolol in January 2019.  He underwent his TEE DCCV at the time.  TEE revealed EF 45 to 50% with mild to moderate MR, enlarged left atrium.  Post cardioversion, he was able to maintain sinus rhythm for the past 2 years.  He has been doing well on Tikosyn.  Repeat echocardiogram obtained on 05/17/2018 shows EF has normalized to 65 to 70%, mild LVH, trivial MR, normal left atrial size.  Last EKG obtained in March 2022 revealed sinus rhythm, right bundle branch block, QTC 517.  Unfortunately, the patient lost his son in early May.  He is still now the cause of his son's death.  Since then, he has been drinking essentially half a gallon of vodka every 2 days.  His wife estimated he has finished about 5-1/2 gallons of vodka so far.  During this time, he had minimal oral intake.  His wife noticed he was only eating 1 meal per day and only takes a few bites.  He was eventually sent to [facility] ED by his wife on 05/17/2021 due to heavy alcohol use.  His wife has been giving him all of his blood pressure medications, Eliquis and Tikosyn.  Prior to May, he was able to do everything at home, at this point he can barely communicate.  On arrival, there was concern that he may have  massive blood loss due to description of green or black stools.  He was given Andexxa for rapid reversal of Eliquis prior to the lab was back. (Although stool Hemoccult documented in epic was negative, however per report, this was incorrect, stool Hemoccult obtained by ED physician was actually positive.) He was hypotensive with systolic blood pressure in the 80s.  Lab work shows significant electrolyte derangement with sodium 132, lactic acid 3.3.  Platelets 70.  AST 126, ALT 91.  Alcohol level 211.  Subsequent lab work does show his hemoglobin was still stable at 11.4.  Blood pressure stabilized after aggressive IV fluid hydration.  Patient has been placed on CIWA protocol and given benzodiazepine to help prevent alcohol withdrawal.  Creatinine initially jumped up to 2.7, however slowly improved to 1.6 after IV hydration.    Past Medical History:  Diagnosis Date   Diverticulosis 2006   Erectile dysfunction    GERD (gastroesophageal reflux disease)    History of skin cancer    History of syncope 1994   no etiology; no recurrence   Hyperlipidemia    LDL goal = < 100, ideally < 70   Hypertension    Persistent atrial fibrillation ([place]) 03/06/2014   PVC's (premature ventricular contractions)    Rotator cuff tear    Visit for monitoring Tikosyn therapy 12/19/2017  Past Surgical History:  Procedure Laterality Date   CARDIOVERSION N/A 12/05/2017   Procedure: CARDIOVERSION;  Surgeon: Dorothy Spark, MD;  Location: [facility] ENDOSCOPY;  Service: Cardiovascular;  Laterality: N/A;   COLONOSCOPY  2006   Diverticulosis   CYSTECTOMY     lip   CYSTECTOMY     anterior thorax-chest   INGUINAL HERNIA REPAIR      x 2   PENILE PROSTHESIS IMPLANT N/A 12/09/2016   Procedure: PENILE PROTHESIS INFLATABLE THREE PIECE COLOPLAST;  Surgeon: Kathie Rhodes, MD;  Location: [facility] ORS;  Service: Urology;  Laterality: N/A;   SHOULDER OPEN ROTATOR CUFF REPAIR Right 04/23/2014   Procedure: RIGHT SHOULDER ROTATOR CUFF REPAIR  WITH GRAFT AND ANCHORS . OPEN ACROMIONECTOMY;  Surgeon: Tobi Bastos, MD;  Location: [facility] ORS;  Service: Orthopedics;  Laterality: Right;   SPINE SURGERY  2012   Dr Gladstone Lighter   TEE WITHOUT CARDIOVERSION N/A 12/05/2017   Procedure: TRANSESOPHAGEAL ECHOCARDIOGRAM (TEE);  Surgeon: Dorothy Spark, MD;  Location: [facility] ENDOSCOPY;  Service: Cardiovascular;  Laterality: N/A;   TONSILLECTOMY AND ADENOIDECTOMY       Home Medications:  Prior to Admission medications   Medication Sig Start Date End Date Taking? Authorizing Provider  acetaminophen (TYLENOL) 500 MG tablet Take 500 mg by mouth every 6 (six) hours as needed for mild pain, fever or headache.   Yes [provider]  amLODipine (NORVASC) 2.5 MG tablet TAKE 1 TABLET EVERY DAY Patient taking differently: Take 2.5 mg by mouth daily. 03/09/21  Yes Sherran Needs, NP  Cholecalciferol (D3) 50 MCG (2000 UT) TABS Take 2,000 Units by mouth daily.   Yes [provider]  dofetilide (TIKOSYN) 500 MCG capsule TAKE 1 CAPSULE TWICE DAILY Patient taking differently: Take 500 mcg by mouth 2 (two) times daily. 04/14/21  Yes Sherran Needs, NP  ELIQUIS 5 MG TABS tablet TAKE 1 TABLET TWICE DAILY Patient taking differently: Take 5 mg by mouth 2 (two) times daily. 08/26/20  Yes Sherran Needs, NP  ferrous sulfate 325 (65 FE) MG tablet Take 325 mg by mouth daily with breakfast.   Yes [provider]  folic acid (FOLVITE) 1 MG tablet TAKE 1 TABLET EVERY DAY Patient taking differently: Take 1 mg by mouth daily. 01/21/21  Yes Burns, Claudina Lick, MD  furosemide (LASIX) 20 MG tablet TAKE 1 TABLET EVERY DAY Patient taking differently: Take 20 mg by mouth daily. 01/13/21  Yes Sherran Needs, NP  GNP VITAMIN B-1 100 MG tablet TAKE 1 TABLET EVERY DAY Patient taking differently: Take 100 mg by mouth daily. 01/21/21  Yes Burns, Claudina Lick, MD  levETIRAcetam (KEPPRA) 500 MG tablet Take 1 tablet (500 mg total) by mouth 2 (two) times daily. 08/03/20  Yes  Burns, Claudina Lick, MD  magnesium oxide (MAG-OX) 400 MG tablet Take 1 tablet (400 mg total) by mouth daily. 05/15/19  Yes Burns, Claudina Lick, MD  metoprolol tartrate (LOPRESSOR) 100 MG tablet TAKE 1 TABLET (100 MG TOTAL) BY MOUTH 2 (TWO) TIMES DAILY. 03/04/21  Yes Sherran Needs, NP  Multiple Vitamins-Minerals (ICAPS AREDS 2 PO) Take 1 capsule by mouth 2 (two) times daily.   Yes [provider]  quinapril (ACCUPRIL) 40 MG tablet TAKE 1 TABLET (40 MG TOTAL) BY MOUTH EVERY MORNING. 09/17/20  Yes Sherran Needs, NP  sodium chloride (OCEAN) 0.65 % SOLN nasal spray Place 1 spray into both nostrils as needed for congestion.   Yes [provider]  traZODone (DESYREL) 100 MG  tablet Take 1 tablet (100 mg total) by mouth at bedtime. Patient taking differently: Take 100 mg by mouth at bedtime as needed for sleep. 12/10/20  Yes Burns, Claudina Lick, MD  vitamin B-12 (CYANOCOBALAMIN) 1000 MCG tablet TAKE 1 TABLET EVERY DAY Patient taking differently: Take 1,000 mcg by mouth daily. 01/21/21  Yes Burns, Claudina Lick, MD  zinc gluconate 50 MG tablet Take 50 mg by mouth daily as needed (cuts/bruises).   Yes [provider]  cholecalciferol (VITAMIN D) 25 MCG (1000 UNIT) tablet TAKE 1 TABLET EVERY DAY Patient not taking: Reported on 04/26/2021 01/21/21   Binnie Rail, MD    Inpatient Medications: Scheduled Meds:  cholecalciferol  2,000 Units Oral Daily   ferrous sulfate  325 mg Oral Q breakfast   folic acid  1 mg Oral Daily   levETIRAcetam  500 mg Oral BID   multivitamin with minerals  1 tablet Oral Daily   potassium chloride  40 mEq Oral Once   thiamine  100 mg Oral Daily   Or   thiamine  100 mg Intravenous Daily   vitamin B-12  1,000 mcg Oral Daily   Continuous Infusions:  lactated ringers     PRN Meds: LORazepam **OR** LORazepam  Allergies:    Allergies  Allergen Reactions   Glucosamine Itching and Other (See Comments)    Itching feet and palms, tightness in chest also    Shellfish-Derived Products Anaphylaxis, Shortness Of Breath and Itching    Itching feet and palms, tightness in chest also  Can eat oysters, can do dyes for tests and can eat just a few shrimp    Social History:   Social History   Socioeconomic History   Marital status: Married    Spouse name: Not on file   Number of children: 1   Years of education: Not on file   Highest education level: High school graduate  Occupational History   Not on file  Tobacco Use   Smoking status: Former Smoker    Packs/day: 1.00    Types: Cigarettes    Quit date: 1990    Years since quitting: 32.4   Smokeless tobacco: Former Systems developer    Types: Chew    Quit date: 1990   Tobacco comment: smoked [place] with 12 year abstinence ( 21 total years of smoking), up to 1 ppd  Vaping Use   Vaping Use: Never used  Substance and Sexual Activity   Alcohol use: Yes    Comment: 3 bottles of liquor a day   Drug use: Never   Sexual activity: Not on file  Other Topics Concern   Not on file  Social History Narrative   Lives at home with his wife   Works in his garden and walks 4 days per week   Right handed   Caffeine: minimal   Social Determinants of Radio broadcast assistant Strain: Not on file  Food Insecurity: Not on file  Transportation Needs: Not on file  Physical Activity: Not on file  Stress: Not on file  Social Connections: Not on file  Intimate Partner Violence: Not on file    Family History:    Family History  Problem Relation Age of Onset   Heart attack Father 67   Diabetes Mother    Transient ischemic attack Mother 63   Diabetes Sister    Heart attack Sister 61   Stomach cancer Maternal Grandfather    Cirrhosis Paternal Interior and spatial designer  alcoholic   Alzheimer's disease Brother    Colon cancer Neg Hx    Colon polyps Neg Hx      ROS:  Please see the history of present illness.   All other ROS reviewed and negative.     Physical Exam/Data:   Vitals:   04/28/21 0400 04/28/21  0500 04/28/21 0600 04/28/21 0823  BP: 117/67 114/63 123/65 136/74  Pulse: 86 84 86 95  Resp: (!) 21 20 19 20   Temp:    97.8 F (36.6 C)  TempSrc:    Oral  SpO2: 100% 100% 100% 100%  Weight:      Height:        Intake/Output Summary (Last 24 hours) at 04/28/2021 1137 Last data filed at 04/28/2021 0141 Gross per 24 hour  Intake 3590 ml  Output 1 ml  Net 3589 ml   Last 3 Weights 05/01/2021 02/03/2021 08/06/2020  Weight (lbs) 180 lb 192 lb 3.2 oz 174 lb 12.8 oz  Weight (kg) 81.647 kg 87.181 kg 79.289 kg     Body mass index is 23.75 kg/m.  General: Slightly confused HEENT: normal Lymph: no adenopathy Neck: no JVD Endocrine:  No thryomegaly Vascular: No carotid bruits; FA pulses 2+ bilaterally without bruits  Cardiac:  normal S1, S2; tachycardic; 1/6 murmur  Lungs:  clear to auscultation bilaterally, no wheezing, rhonchi or rales  Abd: soft, nontender, no hepatomegaly  Ext: no edema Musculoskeletal:  No deformities, BUE and BLE strength normal and equal Skin: warm and dry  Neuro:  CNs 2-12 intact, no focal abnormalities noted Psych: Confused  EKG:  The EKG was personally reviewed and demonstrates: 05/14/2021, sinus rhythm with prolonged QTC.  Repeat EKG on 04/28/2021, sinus tachycardia, QTC 457 Telemetry:  Telemetry was personally reviewed and demonstrates: Sinus tachycardia, heart rate 110-120s  Relevant CV Studies:  Echo 05/17/2018 LV EF: 65% -   70%  Study Conclusions   - Left ventricle: The cavity size was normal. Wall thickness was    increased in a pattern of mild LVH. Systolic function was    vigorous. The estimated ejection fraction was in the range of 65%    to 70%. Left ventricular diastolic function parameters were    normal.   Laboratory Data:  High Sensitivity Troponin:  No results for input(s): TROPONINIHS in the last 720 hours.   Chemistry Recent Labs  Lab 04/28/2021 1605 05/13/2021 1701 04/28/21 0500  NA 134* 132* 136  K 3.8 3.9 3.7  CL 92* 95* 100  CO2 15*   --  14*  GLUCOSE 70 69* 123*  BUN 34* 31* 28*  CREATININE 2.17* 2.70* 1.69*  CALCIUM 7.6*  --  7.3*  GFRNONAA 31*  --  42*  ANIONGAP 27*  --  >20*    Recent Labs  Lab 05/14/2021 1605  PROT 5.7*  ALBUMIN 3.3*  AST 146*  ALT 91*  ALKPHOS 51  BILITOT 1.1   Hematology Recent Labs  Lab 04/24/2021 1605 04/28/2021 1701 04/28/21 0500  WBC 9.2  --  8.8  RBC 3.77*  --  3.15*  HGB 11.4* 11.6* 9.5*  HCT 35.1* 34.0* 29.9*  MCV 93.1  --  94.9  MCH 30.2  --  30.2  MCHC 32.5  --  31.8  RDW 15.8*  --  16.1*  PLT 70*  --  58*   BNPNo results for input(s): BNP, PROBNP in the last 168 hours.  DDimer No results for input(s): DDIMER in the last 168 hours.   Radiology/Studies:  DG Chest Port 1 View  Result Date: 04/28/2021 CLINICAL DATA:  Possible sepsis.  Alcohol abuse.  Hypotension. EXAM: PORTABLE CHEST 1 VIEW COMPARISON:  04/18/2020 FINDINGS: Interval patient rotation to the left. No gross cardiac enlargement. Stable mild pleural and parenchymal scarring at the left lung base. Minimal linear atelectasis or scarring at the right lung base. Thoracic spine degenerative changes. IMPRESSION: No acute abnormality. Electronically Signed   By: Claudie Revering M.D.   On: 05/18/2021 16:28     Assessment and Plan:   Severe dehydration secondary to alcohol abuse and poor oral intake  -Related to significant alcohol abuse and poor oral intake after the loss of his son last month.  His wife has been giving him all of his blood pressure medications despite overall poor oral intake.  This likely exacerbated his hypotension.  -On arrival, systolic blood pressure in the 80s, SBP now in the 110 range after IV hydration.  All home blood pressure medications has been stopped during this admission.  Alcohol abuse  -Started in early May after learning his son's death.  Patient is the mourning   -Wife says he likely drank 5 and half gallons of vodka since early May.  -Mental status still not at baseline at this  time.  Patient appears to be drowsy and slightly confused.    -CIWA protocol.  Consider repeat echocardiogram  Tachycardia with prolonged QTC:  -Related to heavy alcohol use, electrolyte derangement along with acute kidney injury.  Renal function is improving, blood pressure also improved.  Repeat EKG shows improving QTC as well.  -His beta-blocker, Tikosyn and Eliquis were all discontinued on arrival.  -Likely will restart Tikosyn once renal function and electrolyte returned back to normal.  Would recommend restart beta-blocker for now at very low-dose in order to avoid having the patient go back into atrial fibrillation.  Heart rate in the 110s to 120s at this time while in sinus tachycardia.  Persistent atrial fibrillation: Previously underwent TEE DCCV in January 2019 and started on Tikosyn at that time.  Despite recent heavy alcohol use, family member has been giving him all of his medications.   Hypertension: Hypotensive on arrival due to poor oral intake and alcohol abuse  Hyperlipidemia: Statin held in light of elevated liver enzyme secondary to alcohol abuse  Prediabetes  Right bundle branch block  Acute GI bleed: Family member noticed dark stools started last month.  Stool Hemoccult was positive. (The negative results in epic system was incorrect, stool Hemoccult obtained by ED physician was positive.)  Patient was given [place] by ED physician due to concern of massive GI bleed prior to lab result.  Hemoglobin eventually came back reassuring at 11.  Overnight, hemoglobin dropped down to 9.5 likely related to dilution from aggressive IV hydration.  Currently there is no obvious sign of continued blood loss.  Pending GI consult.   Risk Assessment/Risk Scores:          CHA2DS2-VASc Score = 4  This indicates a 4.8% annual risk of stroke. The patient's score is based upon: CHF History: Yes HTN History: Yes Diabetes History: No Stroke History: No Vascular Disease History:  No Age Score: 2 Gender Score: 0          For questions or updates, please contact [place] Please consult [email or web address] for contact info under    Signed, Almyra Deforest, [place]  04/28/2021 11:37 AM  Patient examined chart reviewed discussed care with PA. Exam with confused elderly male. Tremulous upper extremities. Lungs  clear no murmur abdomen soft No edema. Telemetry shows NSR. Agree with holding anticoagulation in setting of guaic positive stool and drop in Hb with hypotension QTc improving with hydration And improvement in renal function He has tolerated Tikosyn for a long time He is on DT;s prophylaxis regimen and resumption of beta blocker should be ok after Hydration He is at aspiration risk taking soft solids. CXR ok Elevated transaminases and lactic acidosis ? From liver toxicity ETOH and hypotension with GI bleeding  Would update echo for EF. Don't see active cardiac issues at this time other then medication adjustment   Jenkins Rouge MD [facility]

## 2021-04-28 NOTE — ED Notes (Signed)
Patient IV site at the right hand was changed again due to bleeding.

## 2021-04-28 NOTE — ED Notes (Addendum)
Patient states he feels anxious. Patient has visible tremors that have increased. Patient is fumbling with covers and cords. Patient is pleasant and says he does not feel right.

## 2021-04-28 NOTE — ED Notes (Signed)
Pt assisted to bathroom with ED tech via wheelchair.

## 2021-04-28 NOTE — ED Notes (Signed)
Patient is awake. Provided Applesauce and ice cream for patient to eat.

## 2021-04-28 NOTE — ED Notes (Signed)
Pt resting quietly. Attached to cardiac monitor x3. A&O x4. NAD.

## 2021-04-28 NOTE — ED Notes (Signed)
This nurse messaged Dr. Rodena Piety, Hospitalist regarding pt's abnormal labs and updated tx plan. Hospitalist aware. No additional orders at this time. Will continue to monitor.

## 2021-04-28 NOTE — ED Notes (Signed)
Report sent to floor nurse.  

## 2021-04-28 NOTE — ED Notes (Signed)
GI at the bedside.

## 2021-04-28 NOTE — H&P (View-Only) (Signed)
Referring Provider:  [facility]         Primary Care Physician:  Binnie Rail, MD Primary Gastroenterologist: Harl Bowie, MD             We were asked to see this patient for:   GI bleed              ASSESSMENT / PLAN:   # 75 yo male with history of Etoh abuse, very heavy use over last two weeks since son's death. In ED with SOB, weakness, melena  x 3 days ( On Eliquis). Stools normally dark on iron.  --PPI infusion --Monitor Hgb, transfuse if needed --Clear liquids okay, NPO after MN for EGD tomorrow. The risks and benefits of EGD with possible biopsies was discussed with the patient and they agree to proceed.   # Thrombocytopenia, new. Bone marrow suppression for heavy Etoh. Secondary to bleeding? Portal HTN?  --Obtain abdominal US  # Acute blood loss anemia. Hgb 11.4 on presentation, down to 9.5 ( after IVF).   --Monitor hgb, transfuse PRBC as needed.   # Afib, home Eliquis on hold ( last dose was morning of 04/21/2021).  # Etoh abuse --CIWA per [facility]   HPI:                                                                                                                             Chief Complaint:  GI bleed  Sergio Mack is a 75 y.o. 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   Patient presented to ED yesterday with weakness, SOB, black stool on Eliquis and oral  iron. He has been drinking heavily since late 05/04/2023 when his son passed away. He Etoh level was 211.  He was hypotensive with SBP in 80s.   Hgb was 11.6 , platelets 70.  PlateletsLactate elevated. He was in AKI. BUN 31, Cr 2.7 ( up from 1.01 in late March). Lactic acid level 3.2. AST 146 / ALT 91 ( pattern of Etoh). INR 1.6 ( not reliable on Eliquis). Anion gap > 20. BP improved after 3 Liters  IVF.   Patient is hard of hearing, some mild  confusion possibly from alcohol withdrawal () most of the history comes from his wife medical record.  He has a history of alcohol abuse, he had been in recovery exploded years until 2 years ago when he started drinking intermittently.  2 weeks ago patient lost his son and since then he has been drinking continuously for the wife.  He has started becoming confused at home.  He takes oral iron which generally turns his stool dark but over the last 2 to 3 days his stools have been black.  He denies NSAID use.  No history of PUD.  He has no abdominal pain.  No nausea or vomiting.  Prior to 2 weeks ago (before his son passed away) he was eating well and maintaining his weight.  He has had almost nothing to eat in 2 weeks    Patient is still in the ED.  His BP 132 /74.  His hemoglobin is down to 1.5 from 11.6 after IV fluids. Creatinine improving. Lactate down from 4.3 to 4 after IVF .  PREVIOUS ENDOSCOPIC EVALUATIONS / [place] STUDIES    Jan 2020 polyp surveillance colonoscopy  --complete exam, excellent bowel prep.A 1 mm polyp was found in the cecum. The polyp was sessile. The polyp was removed with a cold biopsy forceps. Resection and retrieval were complete. - A 4 mm polyp was found in the sigmoid colon. The polyp was sessile. The polyp was removed with a cold snare. Resection and retrieval were complete. - Scattered small and large-mouthed diverticula were found in the sigmoid colon, descending colon, transverse colon, ascending colon and cecum. - Non-bleeding internal hemorrhoids were found during retroflexion. The hemorrhoids were small.   Past Medical History:  Diagnosis Date  . Diverticulosis 2006  . Erectile dysfunction   . GERD (gastroesophageal reflux disease)   . History of skin cancer   . History of syncope 1994   no etiology; no recurrence  . Hyperlipidemia    LDL goal = < 100, ideally < 70  . Hypertension   . Persistent atrial fibrillation ([place]) 03/06/2014  . PVC's  (premature ventricular contractions)   . Rotator cuff tear   . Visit for monitoring Tikosyn therapy 12/19/2017    Past Surgical History:  Procedure Laterality Date  . CARDIOVERSION N/A 12/05/2017   Procedure: CARDIOVERSION;  Surgeon: Dorothy Spark, MD;  Location: [facility] ENDOSCOPY;  Service: Cardiovascular;  Laterality: N/A;  . COLONOSCOPY  2006   Diverticulosis  . CYSTECTOMY     lip  . CYSTECTOMY     anterior thorax-chest  . INGUINAL HERNIA REPAIR      x 2  . 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 SHOULDER OPEN ROTATOR CUFF REPAIR Right 04/23/2014   Procedure: RIGHT SHOULDER ROTATOR CUFF REPAIR WITH GRAFT AND ANCHORS . OPEN ACROMIONECTOMY;  Surgeon: Tobi Bastos, MD;  Location: [facility] ORS;  Service: Orthopedics;  Laterality: Right;  . SPINE SURGERY  2012   Dr Gladstone Lighter  . TEE WITHOUT CARDIOVERSION N/A 12/05/2017   Procedure: TRANSESOPHAGEAL ECHOCARDIOGRAM (TEE);  Surgeon: Dorothy Spark, MD;  Location: [facility] ENDOSCOPY;  Service: Cardiovascular;  Laterality: N/A;  . TONSILLECTOMY AND ADENOIDECTOMY      Prior to Admission medications   Medication Sig Start Date End Date Taking? Authorizing Provider  acetaminophen (TYLENOL) 500 MG tablet Take 500 mg by mouth every 6 (six) hours as needed for mild pain, fever or headache.   Yes [provider]  amLODipine (NORVASC) 2.5 MG tablet TAKE 1 TABLET EVERY DAY Patient taking differently: Take 2.5 mg by mouth daily. 03/09/21  Yes Sherran Needs, NP  Cholecalciferol (D3) 50 MCG (2000 UT) TABS Take 2,000 Units by mouth daily.   Yes [provider]  dofetilide (TIKOSYN) 500 MCG capsule TAKE 1 CAPSULE TWICE DAILY Patient taking differently: Take 500 mcg by mouth 2 (two) times daily. 04/14/21  Yes Sherran Needs, NP  ELIQUIS 5 MG TABS tablet TAKE 1 TABLET TWICE DAILY Patient taking  differently: Take 5 mg by mouth 2 (two) times daily.  08/26/20  Yes Sherran Needs, NP  ferrous sulfate 325 (65 FE) MG tablet Take 325 mg by mouth daily with breakfast.   Yes [provider]  folic acid (FOLVITE) 1 MG tablet TAKE 1 TABLET EVERY DAY Patient taking differently: Take 1 mg by mouth daily. 01/21/21  Yes Burns, Claudina Lick, MD  furosemide (LASIX) 20 MG tablet TAKE 1 TABLET EVERY DAY Patient taking differently: Take 20 mg by mouth daily. 01/13/21  Yes Sherran Needs, NP  GNP VITAMIN B-1 100 MG tablet TAKE 1 TABLET EVERY DAY Patient taking differently: Take 100 mg by mouth daily. 01/21/21  Yes Burns, Claudina Lick, MD  levETIRAcetam (KEPPRA) 500 MG tablet Take 1 tablet (500 mg total) by mouth 2 (two) times daily. 08/03/20  Yes Burns, Claudina Lick, MD  magnesium oxide (MAG-OX) 400 MG tablet Take 1 tablet (400 mg total) by mouth daily. 05/15/19  Yes Burns, Claudina Lick, MD  metoprolol tartrate (LOPRESSOR) 100 MG tablet TAKE 1 TABLET (100 MG TOTAL) BY MOUTH 2 (TWO) TIMES DAILY. 03/04/21  Yes Sherran Needs, NP  Multiple Vitamins-Minerals (ICAPS AREDS 2 PO) Take 1 capsule by mouth 2 (two) times daily.   Yes [provider]  quinapril (ACCUPRIL) 40 MG tablet TAKE 1 TABLET (40 MG TOTAL) BY MOUTH EVERY MORNING. 09/17/20  Yes Sherran Needs, NP  sodium chloride (OCEAN) 0.65 % SOLN nasal spray Place 1 spray into both nostrils as needed for congestion.   Yes [provider]  traZODone (DESYREL) 100 MG tablet Take 1 tablet (100 mg total) by mouth at bedtime. Patient taking differently: Take 100 mg by mouth at bedtime as needed for sleep. 12/10/20  Yes Burns, Claudina Lick, MD  vitamin B-12 (CYANOCOBALAMIN) 1000 MCG tablet TAKE 1 TABLET EVERY DAY Patient taking differently: Take 1,000 mcg by mouth daily. 01/21/21  Yes Burns, Claudina Lick, MD  zinc gluconate 50 MG tablet Take 50 mg by mouth daily as needed (cuts/bruises).   Yes [provider]  cholecalciferol (VITAMIN D) 25 MCG (1000 UNIT) tablet  TAKE 1 TABLET EVERY DAY Patient not taking: Reported on 04/26/2021 01/21/21   Binnie Rail, MD    Current Facility-Administered Medications  Medication Dose Route Frequency Provider Last Rate Last Admin  . carvedilol (COREG) tablet 3.125 mg  3.125 mg Oral BID WC Josue Hector, MD      . cholecalciferol (VITAMIN D3) tablet 2,000 Units  2,000 Units Oral Daily Tu, Ching T, DO   2,000 Units at 04/28/21 1212  . ferrous sulfate tablet 325 mg  325 mg Oral Q breakfast Tu, Ching T, DO   325 mg at 04/28/21 0835  . folic acid (FOLVITE) tablet 1 mg  1 mg Oral Daily Tu, Ching T, DO   1 mg at 04/28/21 1214  . lactated ringers infusion   Intravenous Continuous Georgette Shell, MD 125 mL/hr at 04/28/21 1211 New Bag at 04/28/21 1211  . levETIRAcetam (KEPPRA) tablet 500 mg  500 mg Oral BID Tu, Ching T, DO   500 mg at 04/28/21 1214  . LORazepam (ATIVAN) tablet 1-4 mg  1-4 mg Oral Q1H PRN Tu, Ching T, DO   1 mg at 04/28/21 0042   Or  . LORazepam (ATIVAN) injection 1-4 mg  1-4 mg Intravenous Q1H PRN Tu, Ching T, DO      . multivitamin with minerals tablet 1 tablet  1 tablet Oral Daily Tu, Ching T, DO   1 tablet at 04/28/21 1215  . pantoprazole (  PROTONIX) injection 40 mg  40 mg Intravenous Q12H Georgette Shell, MD      . thiamine tablet 100 mg  100 mg Oral Daily Tu, Ching T, DO   100 mg at 04/28/21 1216   Or  . thiamine (B-1) injection 100 mg  100 mg Intravenous Daily Tu, Ching T, DO   100 mg at 04/26/2021 2109  . vitamin B-12 (CYANOCOBALAMIN) tablet 1,000 mcg  1,000 mcg Oral Daily Tu, Ching T, DO   1,000 mcg at 04/28/21 1216   Current Outpatient Medications  Medication Sig Dispense Refill  . acetaminophen (TYLENOL) 500 MG tablet Take 500 mg by mouth every 6 (six) hours as needed for mild pain, fever or headache.    Marland Kitchen amLODipine (NORVASC) 2.5 MG tablet TAKE 1 TABLET EVERY DAY (Patient taking differently: Take 2.5 mg by mouth daily.) 90 tablet 1  . Cholecalciferol (D3) 50 MCG (2000 UT) TABS Take 2,000  Units by mouth daily.    Marland Kitchen dofetilide (TIKOSYN) 500 MCG capsule TAKE 1 CAPSULE TWICE DAILY (Patient taking differently: Take 500 mcg by mouth 2 (two) times daily.) 180 capsule 2  . ELIQUIS 5 MG TABS tablet TAKE 1 TABLET TWICE DAILY (Patient taking differently: Take 5 mg by mouth 2 (two) times daily.) 180 tablet 3  . ferrous sulfate 325 (65 FE) MG tablet Take 325 mg by mouth daily with breakfast.    . folic acid (FOLVITE) 1 MG tablet TAKE 1 TABLET EVERY DAY (Patient taking differently: Take 1 mg by mouth daily.) 90 tablet 3  . furosemide (LASIX) 20 MG tablet TAKE 1 TABLET EVERY DAY (Patient taking differently: Take 20 mg by mouth daily.) 90 tablet 2  . GNP VITAMIN B-1 100 MG tablet TAKE 1 TABLET EVERY DAY (Patient taking differently: Take 100 mg by mouth daily.) 90 tablet 3  . levETIRAcetam (KEPPRA) 500 MG tablet Take 1 tablet (500 mg total) by mouth 2 (two) times daily. 180 tablet 3  . magnesium oxide (MAG-OX) 400 MG tablet Take 1 tablet (400 mg total) by mouth daily. 90 tablet 3  . metoprolol tartrate (LOPRESSOR) 100 MG tablet TAKE 1 TABLET (100 MG TOTAL) BY MOUTH 2 (TWO) TIMES DAILY. 180 tablet 1  . Multiple Vitamins-Minerals (ICAPS AREDS 2 PO) Take 1 capsule by mouth 2 (two) times daily.    . quinapril (ACCUPRIL) 40 MG tablet TAKE 1 TABLET (40 MG TOTAL) BY MOUTH EVERY MORNING. 90 tablet 2  . sodium chloride (OCEAN) 0.65 % SOLN nasal spray Place 1 spray into both nostrils as needed for congestion.    . traZODone (DESYREL) 100 MG tablet Take 1 tablet (100 mg total) by mouth at bedtime. (Patient taking differently: Take 100 mg by mouth at bedtime as needed for sleep.) 90 tablet 1  . vitamin B-12 (CYANOCOBALAMIN) 1000 MCG tablet TAKE 1 TABLET EVERY DAY (Patient taking differently: Take 1,000 mcg by mouth daily.) 90 tablet 3  . zinc gluconate 50 MG tablet Take 50 mg by mouth daily as needed (cuts/bruises).    . cholecalciferol (VITAMIN D) 25 MCG (1000 UNIT) tablet TAKE 1 TABLET EVERY DAY (Patient not  taking: Reported on 04/22/2021) 90 tablet 3    Allergies as of 05/10/2021 - Review Complete 04/22/2021  Allergen Reaction Noted  . Glucosamine Itching and Other (See Comments) 01/21/2011  . Shellfish-derived products Anaphylaxis, Shortness Of Breath, and Itching 01/21/2011    Family History  Problem Relation Age of Onset  . Heart attack Father 64  . Diabetes Mother   .  Transient ischemic attack Mother 42  . Diabetes Sister   . Heart attack Sister 34  . Stomach cancer Maternal Grandfather   . Cirrhosis Paternal Uncle        alcoholic  . Alzheimer's disease Brother   . Colon cancer Neg Hx   . Colon polyps Neg Hx     Social History   Socioeconomic History  . Marital status: Married    Spouse name: Not on file  . Number of children: 1  . Years of education: Not on file  . Highest education level: High school graduate  Occupational History  . Not on file  Tobacco Use  . Smoking status: Former Smoker    Packs/day: 1.00    Types: Cigarettes    Quit date: 1990    Years since quitting: 32.4  . Smokeless tobacco: Former Systems developer    Types: Chew    Quit date: 71  . Tobacco comment: smoked [place] with 12 year abstinence ( 21 total years of smoking), up to 1 ppd  Vaping Use  . Vaping Use: Never used  Substance and Sexual Activity  . Alcohol use: Yes    Comment: 3 bottles of liquor a day  . Drug use: Never  . Sexual activity: Not on file  Other Topics Concern  . Not on file  Social History Narrative   Lives at home with his wife   Works in his garden and walks 4 days per week   Right handed   Caffeine: minimal   Social Determinants of Radio broadcast assistant Strain: Not on file  Food Insecurity: Not on file  Transportation Needs: Not on file  Physical Activity: Not on file  Stress: Not on file  Social Connections: Not on file  Intimate Partner Violence: Not on file    Review of Systems: All systems reviewed and negative except where noted in  HPI.    OBJECTIVE:    Physical Exam: Vital signs in last 24 hours: Temp:  [97.8 F (36.6 C)-98.3 F (36.8 C)] 98.3 F (36.8 C) (06/08 1203) Pulse Rate:  [65-131] 115 (06/08 1203) Resp:  [11-24] 18 (06/08 1203) BP: (86-136)/(52-75) 132/73 (06/08 1203) SpO2:  [89 %-100 %] 100 % (06/08 1203) Weight:  [81.6 kg] 81.6 kg (06/07 1609)   General:   Alert  male in NAD Psych:  Pleasant, cooperative. Normal mood and affect. Eyes:  Pupils equal, sclera clear, no icterus.   Conjunctiva pink. Ears:  Normal auditory acuity. Nose:  No deformity, discharge,  or lesions. Neck:  Supple; no masses Lungs:  Clear throughout to auscultation.   No wheezes, crackles, or rhonchi.  Heart:  Slightly tachycardic + murmur, no lower extremity edema Abdomen:  Soft, non-distended, nontender, BS active, no palp mass   Rectal:  Deferred  Msk:  Symmetrical without gross deformities. . Neurologic:  Alert , mild confusion.  Skin:  Intact without significant lesions or rashes.  Filed Weights   04/26/2021 1609  Weight: 81.6 kg     Scheduled inpatient medications . carvedilol  3.125 mg Oral BID WC  . cholecalciferol  2,000 Units Oral Daily  . ferrous sulfate  325 mg Oral Q breakfast  . folic acid  1 mg Oral Daily  . levETIRAcetam  500 mg Oral BID  . multivitamin with minerals  1 tablet Oral Daily  . pantoprazole (PROTONIX) IV  40 mg Intravenous Q12H  . thiamine  100 mg Oral Daily   Or  . thiamine  100 mg  Intravenous Daily  . vitamin B-12  1,000 mcg Oral Daily      Intake/Output from previous day: 06/07 0701 - 06/08 0700 In: 3590 [IV Piggyback:3590] Out: 1 [Stool:1] Intake/Output this shift: No intake/output data recorded.   Lab Results: Recent Labs    05/05/2021 1605 04/23/2021 1701 04/28/21 0500  WBC 9.2  --  8.8  HGB 11.4* 11.6* 9.5*  HCT 35.1* 34.0* 29.9*  PLT 70*  --  58*   BMET Recent Labs    04/26/2021 1605 05/14/2021 1701 04/28/21 0500  NA 134* 132* 136  K 3.8 3.9 3.7  CL 92*  95* 100  CO2 15*  --  14*  GLUCOSE 70 69* 123*  BUN 34* 31* 28*  CREATININE 2.17* 2.70* 1.69*  CALCIUM 7.6*  --  7.3*   LFT Recent Labs    05/08/2021 1605  PROT 5.7*  ALBUMIN 3.3*  AST 146*  ALT 91*  ALKPHOS 51  BILITOT 1.1   PT/INR Recent Labs    05/20/2021 1605  LABPROT 19.3*  INR 1.6*   Hepatitis Panel No results for input(s): HEPBSAG, HCVAB, HEPAIGM, HEPBIGM in the last 72 hours.   . CBC Latest Ref Rng & Units 04/28/2021 05/20/2021 05/08/2021  WBC 4.0 - 10.5 K/uL 8.8 - 9.2  Hemoglobin 13.0 - 17.0 g/dL 9.5(L) 11.6(L) 11.4(L)  Hematocrit 39.0 - 52.0 % 29.9(L) 34.0(L) 35.1(L)  Platelets 150 - 400 K/uL 58(L) - 70(L)    . CMP Latest Ref Rng & Units 04/28/2021 04/28/2021 05/08/2021  Glucose 70 - 99 mg/dL 123(H) 69(L) 70  BUN 8 - 23 mg/dL 28(H) 31(H) 34(H)  Creatinine 0.61 - 1.24 mg/dL 1.69(H) 2.70(H) 2.17(H)  Sodium 135 - 145 mmol/L 136 132(L) 134(L)  Potassium 3.5 - 5.1 mmol/L 3.7 3.9 3.8  Chloride 98 - 111 mmol/L 100 95(L) 92(L)  CO2 22 - 32 mmol/L 14(L) - 15(L)  Calcium 8.9 - 10.3 mg/dL 7.3(L) - 7.6(L)  Total Protein 6.5 - 8.1 g/dL - - 5.7(L)  Total Bilirubin 0.3 - 1.2 mg/dL - - 1.1  Alkaline Phos 38 - 126 U/L - - 51  AST 15 - 41 U/L - - 146(H)  ALT 0 - 44 U/L - - 91(H)   Studies/Results: DG Chest Port 1 View  Result Date: 05/07/2021 CLINICAL DATA:  Possible sepsis.  Alcohol abuse.  Hypotension. EXAM: PORTABLE CHEST 1 VIEW COMPARISON:  04/18/2020 FINDINGS: Interval patient rotation to the left. No gross cardiac enlargement. Stable mild pleural and parenchymal scarring at the left lung base. Minimal linear atelectasis or scarring at the right lung base. Thoracic spine degenerative changes. IMPRESSION: No acute abnormality. Electronically Signed   By: Claudie Revering M.D.   On: 04/22/2021 16:28    Principal Problem:   Hypotension Active Problems:   HFrEF (heart failure with reduced ejection fraction) (HCC)   Persistent atrial fibrillation (HCC)   Alcoholism (HCC)    Metabolic acidosis   AKI (acute kidney injury) ([place])   Thrombocytopenia (HCC)   Transaminitis   GI bleed    Tye Savoy, NP-C @  04/28/2021, 3:12 PM

## 2021-04-28 NOTE — ED Notes (Signed)
Gave patient his medications. Patient is resting trying to fall asleep.

## 2021-04-28 NOTE — Consult Note (Addendum)
Referring Provider:  [facility]         Primary Care Physician:  Binnie Rail, MD Primary Gastroenterologist: Harl Bowie, MD             We were asked to see this patient for:   GI bleed              ASSESSMENT / PLAN:   # 75 yo male with history of Etoh abuse, very heavy use over last two weeks since son's death. In ED with SOB, weakness, melena  x 3 days ( On Eliquis). Stools normally dark on iron.  --PPI infusion --Monitor Hgb, transfuse if needed --Clear liquids okay, NPO after MN for EGD tomorrow. The risks and benefits of EGD with possible biopsies was discussed with the patient and they agree to proceed.   # Thrombocytopenia, new. Bone marrow suppression for heavy Etoh. Secondary to bleeding? Portal HTN?  --Obtain abdominal US  # Acute blood loss anemia. Hgb 11.4 on presentation, down to 9.5 ( after IVF).   --Monitor hgb, transfuse PRBC as needed.   # Afib, home Eliquis on hold ( last dose was morning of 04/26/2021).  # Etoh abuse --CIWA per [facility]   HPI:                                                                                                                             Chief Complaint:  GI bleed  Sergio Mack is a 75 y.o. 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   Patient presented to ED yesterday with weakness, SOB, black stool on Eliquis and oral  iron. He has been drinking heavily since late 04/26/2023 when his son passed away. He Etoh level was 211.  He was hypotensive with SBP in 80s.   Hgb was 11.6 , platelets 70.  PlateletsLactate elevated. He was in AKI. BUN 31, Cr 2.7 ( up from 1.01 in late March). Lactic acid level 3.2. AST 146 / ALT 91 ( pattern of Etoh). INR 1.6 ( not reliable on Eliquis). Anion gap > 20. BP improved after 3 Liters  IVF.   Patient is hard of hearing, some mild  confusion possibly from alcohol withdrawal () most of the history comes from his wife medical record.  He has a history of alcohol abuse, he had been in recovery exploded years until 2 years ago when he started drinking intermittently.  2 weeks ago patient lost his son and since then he has been drinking continuously for the wife.  He has started becoming confused at home.  He takes oral iron which generally turns his stool dark but over the last 2 to 3 days his stools have been black.  He denies NSAID use.  No history of PUD.  He has no abdominal pain.  No nausea or vomiting.  Prior to 2 weeks ago (before his son passed away) he was eating well and maintaining his weight.  He has had almost nothing to eat in 2 weeks    Patient is still in the ED.  His BP 132 /74.  His hemoglobin is down to 1.5 from 11.6 after IV fluids. Creatinine improving. Lactate down from 4.3 to 4 after IVF .  PREVIOUS ENDOSCOPIC EVALUATIONS / [place] STUDIES    Jan 2020 polyp surveillance colonoscopy  --complete exam, excellent bowel prep.A 1 mm polyp was found in the cecum. The polyp was sessile. The polyp was removed with a cold biopsy forceps. Resection and retrieval were complete. - A 4 mm polyp was found in the sigmoid colon. The polyp was sessile. The polyp was removed with a cold snare. Resection and retrieval were complete. - Scattered small and large-mouthed diverticula were found in the sigmoid colon, descending colon, transverse colon, ascending colon and cecum. - Non-bleeding internal hemorrhoids were found during retroflexion. The hemorrhoids were small.   Past Medical History:  Diagnosis Date  . Diverticulosis 2006  . Erectile dysfunction   . GERD (gastroesophageal reflux disease)   . History of skin cancer   . History of syncope 1994   no etiology; no recurrence  . Hyperlipidemia    LDL goal = < 100, ideally < 70  . Hypertension   . Persistent atrial fibrillation ([place]) 03/06/2014  . PVC's  (premature ventricular contractions)   . Rotator cuff tear   . Visit for monitoring Tikosyn therapy 12/19/2017    Past Surgical History:  Procedure Laterality Date  . CARDIOVERSION N/A 12/05/2017   Procedure: CARDIOVERSION;  Surgeon: Dorothy Spark, MD;  Location: [facility] ENDOSCOPY;  Service: Cardiovascular;  Laterality: N/A;  . COLONOSCOPY  2006   Diverticulosis  . CYSTECTOMY     lip  . CYSTECTOMY     anterior thorax-chest  . INGUINAL HERNIA REPAIR      x 2  . 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 SHOULDER OPEN ROTATOR CUFF REPAIR Right 04/23/2014   Procedure: RIGHT SHOULDER ROTATOR CUFF REPAIR WITH GRAFT AND ANCHORS . OPEN ACROMIONECTOMY;  Surgeon: Tobi Bastos, MD;  Location: [facility] ORS;  Service: Orthopedics;  Laterality: Right;  . SPINE SURGERY  2012   Dr Gladstone Lighter  . TEE WITHOUT CARDIOVERSION N/A 12/05/2017   Procedure: TRANSESOPHAGEAL ECHOCARDIOGRAM (TEE);  Surgeon: Dorothy Spark, MD;  Location: [facility] ENDOSCOPY;  Service: Cardiovascular;  Laterality: N/A;  . TONSILLECTOMY AND ADENOIDECTOMY      Prior to Admission medications   Medication Sig Start Date End Date Taking? Authorizing Provider  acetaminophen (TYLENOL) 500 MG tablet Take 500 mg by mouth every 6 (six) hours as needed for mild pain, fever or headache.   Yes [provider]  amLODipine (NORVASC) 2.5 MG tablet TAKE 1 TABLET EVERY DAY Patient taking differently: Take 2.5 mg by mouth daily. 03/09/21  Yes Sherran Needs, NP  Cholecalciferol (D3) 50 MCG (2000 UT) TABS Take 2,000 Units by mouth daily.   Yes [provider]  dofetilide (TIKOSYN) 500 MCG capsule TAKE 1 CAPSULE TWICE DAILY Patient taking differently: Take 500 mcg by mouth 2 (two) times daily. 04/14/21  Yes Sherran Needs, NP  ELIQUIS 5 MG TABS tablet TAKE 1 TABLET TWICE DAILY Patient taking  differently: Take 5 mg by mouth 2 (two) times daily.  08/26/20  Yes Sherran Needs, NP  ferrous sulfate 325 (65 FE) MG tablet Take 325 mg by mouth daily with breakfast.   Yes [provider]  folic acid (FOLVITE) 1 MG tablet TAKE 1 TABLET EVERY DAY Patient taking differently: Take 1 mg by mouth daily. 01/21/21  Yes Burns, Claudina Lick, MD  furosemide (LASIX) 20 MG tablet TAKE 1 TABLET EVERY DAY Patient taking differently: Take 20 mg by mouth daily. 01/13/21  Yes Sherran Needs, NP  GNP VITAMIN B-1 100 MG tablet TAKE 1 TABLET EVERY DAY Patient taking differently: Take 100 mg by mouth daily. 01/21/21  Yes Burns, Claudina Lick, MD  levETIRAcetam (KEPPRA) 500 MG tablet Take 1 tablet (500 mg total) by mouth 2 (two) times daily. 08/03/20  Yes Burns, Claudina Lick, MD  magnesium oxide (MAG-OX) 400 MG tablet Take 1 tablet (400 mg total) by mouth daily. 05/15/19  Yes Burns, Claudina Lick, MD  metoprolol tartrate (LOPRESSOR) 100 MG tablet TAKE 1 TABLET (100 MG TOTAL) BY MOUTH 2 (TWO) TIMES DAILY. 03/04/21  Yes Sherran Needs, NP  Multiple Vitamins-Minerals (ICAPS AREDS 2 PO) Take 1 capsule by mouth 2 (two) times daily.   Yes [provider]  quinapril (ACCUPRIL) 40 MG tablet TAKE 1 TABLET (40 MG TOTAL) BY MOUTH EVERY MORNING. 09/17/20  Yes Sherran Needs, NP  sodium chloride (OCEAN) 0.65 % SOLN nasal spray Place 1 spray into both nostrils as needed for congestion.   Yes [provider]  traZODone (DESYREL) 100 MG tablet Take 1 tablet (100 mg total) by mouth at bedtime. Patient taking differently: Take 100 mg by mouth at bedtime as needed for sleep. 12/10/20  Yes Burns, Claudina Lick, MD  vitamin B-12 (CYANOCOBALAMIN) 1000 MCG tablet TAKE 1 TABLET EVERY DAY Patient taking differently: Take 1,000 mcg by mouth daily. 01/21/21  Yes Burns, Claudina Lick, MD  zinc gluconate 50 MG tablet Take 50 mg by mouth daily as needed (cuts/bruises).   Yes [provider]  cholecalciferol (VITAMIN D) 25 MCG (1000 UNIT) tablet  TAKE 1 TABLET EVERY DAY Patient not taking: Reported on 05/20/2021 01/21/21   Binnie Rail, MD    Current Facility-Administered Medications  Medication Dose Route Frequency Provider Last Rate Last Admin  . carvedilol (COREG) tablet 3.125 mg  3.125 mg Oral BID WC Josue Hector, MD      . cholecalciferol (VITAMIN D3) tablet 2,000 Units  2,000 Units Oral Daily Tu, Ching T, DO   2,000 Units at 04/28/21 1212  . ferrous sulfate tablet 325 mg  325 mg Oral Q breakfast Tu, Ching T, DO   325 mg at 04/28/21 0835  . folic acid (FOLVITE) tablet 1 mg  1 mg Oral Daily Tu, Ching T, DO   1 mg at 04/28/21 1214  . lactated ringers infusion   Intravenous Continuous Georgette Shell, MD 125 mL/hr at 04/28/21 1211 New Bag at 04/28/21 1211  . levETIRAcetam (KEPPRA) tablet 500 mg  500 mg Oral BID Tu, Ching T, DO   500 mg at 04/28/21 1214  . LORazepam (ATIVAN) tablet 1-4 mg  1-4 mg Oral Q1H PRN Tu, Ching T, DO   1 mg at 04/28/21 0042   Or  . LORazepam (ATIVAN) injection 1-4 mg  1-4 mg Intravenous Q1H PRN Tu, Ching T, DO      . multivitamin with minerals tablet 1 tablet  1 tablet Oral Daily Tu, Ching T, DO   1 tablet at 04/28/21 1215  . pantoprazole (  PROTONIX) injection 40 mg  40 mg Intravenous Q12H Georgette Shell, MD      . thiamine tablet 100 mg  100 mg Oral Daily Tu, Ching T, DO   100 mg at 04/28/21 1216   Or  . thiamine (B-1) injection 100 mg  100 mg Intravenous Daily Tu, Ching T, DO   100 mg at 05/18/2021 2109  . vitamin B-12 (CYANOCOBALAMIN) tablet 1,000 mcg  1,000 mcg Oral Daily Tu, Ching T, DO   1,000 mcg at 04/28/21 1216   Current Outpatient Medications  Medication Sig Dispense Refill  . acetaminophen (TYLENOL) 500 MG tablet Take 500 mg by mouth every 6 (six) hours as needed for mild pain, fever or headache.    Marland Kitchen amLODipine (NORVASC) 2.5 MG tablet TAKE 1 TABLET EVERY DAY (Patient taking differently: Take 2.5 mg by mouth daily.) 90 tablet 1  . Cholecalciferol (D3) 50 MCG (2000 UT) TABS Take 2,000  Units by mouth daily.    Marland Kitchen dofetilide (TIKOSYN) 500 MCG capsule TAKE 1 CAPSULE TWICE DAILY (Patient taking differently: Take 500 mcg by mouth 2 (two) times daily.) 180 capsule 2  . ELIQUIS 5 MG TABS tablet TAKE 1 TABLET TWICE DAILY (Patient taking differently: Take 5 mg by mouth 2 (two) times daily.) 180 tablet 3  . ferrous sulfate 325 (65 FE) MG tablet Take 325 mg by mouth daily with breakfast.    . folic acid (FOLVITE) 1 MG tablet TAKE 1 TABLET EVERY DAY (Patient taking differently: Take 1 mg by mouth daily.) 90 tablet 3  . furosemide (LASIX) 20 MG tablet TAKE 1 TABLET EVERY DAY (Patient taking differently: Take 20 mg by mouth daily.) 90 tablet 2  . GNP VITAMIN B-1 100 MG tablet TAKE 1 TABLET EVERY DAY (Patient taking differently: Take 100 mg by mouth daily.) 90 tablet 3  . levETIRAcetam (KEPPRA) 500 MG tablet Take 1 tablet (500 mg total) by mouth 2 (two) times daily. 180 tablet 3  . magnesium oxide (MAG-OX) 400 MG tablet Take 1 tablet (400 mg total) by mouth daily. 90 tablet 3  . metoprolol tartrate (LOPRESSOR) 100 MG tablet TAKE 1 TABLET (100 MG TOTAL) BY MOUTH 2 (TWO) TIMES DAILY. 180 tablet 1  . Multiple Vitamins-Minerals (ICAPS AREDS 2 PO) Take 1 capsule by mouth 2 (two) times daily.    . quinapril (ACCUPRIL) 40 MG tablet TAKE 1 TABLET (40 MG TOTAL) BY MOUTH EVERY MORNING. 90 tablet 2  . sodium chloride (OCEAN) 0.65 % SOLN nasal spray Place 1 spray into both nostrils as needed for congestion.    . traZODone (DESYREL) 100 MG tablet Take 1 tablet (100 mg total) by mouth at bedtime. (Patient taking differently: Take 100 mg by mouth at bedtime as needed for sleep.) 90 tablet 1  . vitamin B-12 (CYANOCOBALAMIN) 1000 MCG tablet TAKE 1 TABLET EVERY DAY (Patient taking differently: Take 1,000 mcg by mouth daily.) 90 tablet 3  . zinc gluconate 50 MG tablet Take 50 mg by mouth daily as needed (cuts/bruises).    . cholecalciferol (VITAMIN D) 25 MCG (1000 UNIT) tablet TAKE 1 TABLET EVERY DAY (Patient not  taking: Reported on 05/10/2021) 90 tablet 3    Allergies as of 05/12/2021 - Review Complete 05/09/2021  Allergen Reaction Noted  . Glucosamine Itching and Other (See Comments) 01/21/2011  . Shellfish-derived products Anaphylaxis, Shortness Of Breath, and Itching 01/21/2011    Family History  Problem Relation Age of Onset  . Heart attack Father 24  . Diabetes Mother   .  Transient ischemic attack Mother 46  . Diabetes Sister   . Heart attack Sister 22  . Stomach cancer Maternal Grandfather   . Cirrhosis Paternal Uncle        alcoholic  . Alzheimer's disease Brother   . Colon cancer Neg Hx   . Colon polyps Neg Hx     Social History   Socioeconomic History  . Marital status: Married    Spouse name: Not on file  . Number of children: 1  . Years of education: Not on file  . Highest education level: High school graduate  Occupational History  . Not on file  Tobacco Use  . Smoking status: Former Smoker    Packs/day: 1.00    Types: Cigarettes    Quit date: 1990    Years since quitting: 32.4  . Smokeless tobacco: Former Systems developer    Types: Chew    Quit date: 79  . Tobacco comment: smoked [place] with 12 year abstinence ( 21 total years of smoking), up to 1 ppd  Vaping Use  . Vaping Use: Never used  Substance and Sexual Activity  . Alcohol use: Yes    Comment: 3 bottles of liquor a day  . Drug use: Never  . Sexual activity: Not on file  Other Topics Concern  . Not on file  Social History Narrative   Lives at home with his wife   Works in his garden and walks 4 days per week   Right handed   Caffeine: minimal   Social Determinants of Radio broadcast assistant Strain: Not on file  Food Insecurity: Not on file  Transportation Needs: Not on file  Physical Activity: Not on file  Stress: Not on file  Social Connections: Not on file  Intimate Partner Violence: Not on file    Review of Systems: All systems reviewed and negative except where noted in  HPI.    OBJECTIVE:    Physical Exam: Vital signs in last 24 hours: Temp:  [97.8 F (36.6 C)-98.3 F (36.8 C)] 98.3 F (36.8 C) (06/08 1203) Pulse Rate:  [65-131] 115 (06/08 1203) Resp:  [11-24] 18 (06/08 1203) BP: (86-136)/(52-75) 132/73 (06/08 1203) SpO2:  [89 %-100 %] 100 % (06/08 1203) Weight:  [81.6 kg] 81.6 kg (06/07 1609)   General:   Alert  male in NAD Psych:  Pleasant, cooperative. Normal mood and affect. Eyes:  Pupils equal, sclera clear, no icterus.   Conjunctiva pink. Ears:  Normal auditory acuity. Nose:  No deformity, discharge,  or lesions. Neck:  Supple; no masses Lungs:  Clear throughout to auscultation.   No wheezes, crackles, or rhonchi.  Heart:  Slightly tachycardic + murmur, no lower extremity edema Abdomen:  Soft, non-distended, nontender, BS active, no palp mass   Rectal:  Deferred  Msk:  Symmetrical without gross deformities. . Neurologic:  Alert , mild confusion.  Skin:  Intact without significant lesions or rashes.  Filed Weights   05/12/2021 1609  Weight: 81.6 kg     Scheduled inpatient medications . carvedilol  3.125 mg Oral BID WC  . cholecalciferol  2,000 Units Oral Daily  . ferrous sulfate  325 mg Oral Q breakfast  . folic acid  1 mg Oral Daily  . levETIRAcetam  500 mg Oral BID  . multivitamin with minerals  1 tablet Oral Daily  . pantoprazole (PROTONIX) IV  40 mg Intravenous Q12H  . thiamine  100 mg Oral Daily   Or  . thiamine  100 mg  Intravenous Daily  . vitamin B-12  1,000 mcg Oral Daily      Intake/Output from previous day: 06/07 0701 - 06/08 0700 In: 3590 [IV Piggyback:3590] Out: 1 [Stool:1] Intake/Output this shift: No intake/output data recorded.   Lab Results: Recent Labs    05/18/2021 1605 04/21/2021 1701 04/28/21 0500  WBC 9.2  --  8.8  HGB 11.4* 11.6* 9.5*  HCT 35.1* 34.0* 29.9*  PLT 70*  --  58*   BMET Recent Labs    05/10/2021 1605 05/08/2021 1701 04/28/21 0500  NA 134* 132* 136  K 3.8 3.9 3.7  CL 92*  95* 100  CO2 15*  --  14*  GLUCOSE 70 69* 123*  BUN 34* 31* 28*  CREATININE 2.17* 2.70* 1.69*  CALCIUM 7.6*  --  7.3*   LFT Recent Labs    05/11/2021 1605  PROT 5.7*  ALBUMIN 3.3*  AST 146*  ALT 91*  ALKPHOS 51  BILITOT 1.1   PT/INR Recent Labs    04/30/2021 1605  LABPROT 19.3*  INR 1.6*   Hepatitis Panel No results for input(s): HEPBSAG, HCVAB, HEPAIGM, HEPBIGM in the last 72 hours.   . CBC Latest Ref Rng & Units 04/28/2021 04/26/2021 05/13/2021  WBC 4.0 - 10.5 K/uL 8.8 - 9.2  Hemoglobin 13.0 - 17.0 g/dL 9.5(L) 11.6(L) 11.4(L)  Hematocrit 39.0 - 52.0 % 29.9(L) 34.0(L) 35.1(L)  Platelets 150 - 400 K/uL 58(L) - 70(L)    . CMP Latest Ref Rng & Units 04/28/2021 05/01/2021 05/01/2021  Glucose 70 - 99 mg/dL 123(H) 69(L) 70  BUN 8 - 23 mg/dL 28(H) 31(H) 34(H)  Creatinine 0.61 - 1.24 mg/dL 1.69(H) 2.70(H) 2.17(H)  Sodium 135 - 145 mmol/L 136 132(L) 134(L)  Potassium 3.5 - 5.1 mmol/L 3.7 3.9 3.8  Chloride 98 - 111 mmol/L 100 95(L) 92(L)  CO2 22 - 32 mmol/L 14(L) - 15(L)  Calcium 8.9 - 10.3 mg/dL 7.3(L) - 7.6(L)  Total Protein 6.5 - 8.1 g/dL - - 5.7(L)  Total Bilirubin 0.3 - 1.2 mg/dL - - 1.1  Alkaline Phos 38 - 126 U/L - - 51  AST 15 - 41 U/L - - 146(H)  ALT 0 - 44 U/L - - 91(H)   Studies/Results: DG Chest Port 1 View  Result Date: 05/09/2021 CLINICAL DATA:  Possible sepsis.  Alcohol abuse.  Hypotension. EXAM: PORTABLE CHEST 1 VIEW COMPARISON:  04/18/2020 FINDINGS: Interval patient rotation to the left. No gross cardiac enlargement. Stable mild pleural and parenchymal scarring at the left lung base. Minimal linear atelectasis or scarring at the right lung base. Thoracic spine degenerative changes. IMPRESSION: No acute abnormality. Electronically Signed   By: Claudie Revering M.D.   On: 05/04/2021 16:28    Principal Problem:   Hypotension Active Problems:   HFrEF (heart failure with reduced ejection fraction) (HCC)   Persistent atrial fibrillation (HCC)   Alcoholism (HCC)    Metabolic acidosis   AKI (acute kidney injury) ([place])   Thrombocytopenia (HCC)   Transaminitis   GI bleed    Tye Savoy, NP-C @  04/28/2021, 3:12 PM

## 2021-04-28 NOTE — Progress Notes (Signed)
PROGRESS NOTE    Sergio Mack  YCX:[id number] DOB: Feb 11, 1946 DOA: 05/08/2021 PCP: Binnie Rail, MD   Brief Narrative: HPI per Dr. Flossie Buffy 05/02/2021 Sergio Mack is a 75 y.o. male with medical history significant for HFrEF (EF 65-70% 04/2018), HTN, persistent atrial fibrillation on Eliquis, history of alcoholic withdrawal seizures, and prediabetes who was brought in by his wife for concerns of heavy alcohol use.  Wife is not at bedside at this time.  Patient reports that he started drinking for the past 2 weeks after his son passed away.  He states because of his son's death is still unknown.  He has been drinking up to half a gallon of vodka every 2 days.  Has not had much to eat or drink.  No nausea, vomiting abdominal pain.  Has had diarrhea and noted to be dark.  States he takes iron supplementation.  He reports that he continues to take his medication including Eliquis and his antiepileptic. Last alcoholic drink this morning.  ED Course: He was hypotensive down to 80/52 which improved to systolic of 497 after 3 liters of IV fluid.  Fecal occult blood test was confirmed to be positive by ED physician Dr. Roderic Palau and patient was given Andexxa for concerns of massive GI bleed initially prior to labs returning. Later hemoglobin found to be stable at 11.4.  Platelet of 70.  Sodium 132.  AKI of 2.7 from prior 1.09.  Glucose of 69.  Lactate of 3.3.  AST of 126.  ALT of 91.  Gap of 21.  Alcohol level 211.  Assessment & Plan:   Principal Problem:   Hypotension Active Problems:   HFrEF (heart failure with reduced ejection fraction) (HCC)   Persistent atrial fibrillation (HCC)   Alcoholism (HCC)   Metabolic acidosis   AKI (acute kidney injury) (HCC)   Thrombocytopenia (HCC)   Transaminitis   GI bleed  #1 alcohol abuse-patient started drinking heavily since the death of his son.  He is tachypneic tachycardic, blood pressure improved with IV fluids.  Continue CIWA protocol.  #2 hypotension  resolved with IV fluids  #3 lactic acidosis also resolved with IV fluids  #4 dehydration secondary to decreased p.o. intake and alcohol abuse-on IV fluids  #5 prolonged QTC on Tikosyn Coreg.  Cardiology consulted restarted Coreg.  Mag is 2.4K is 3.7.  He received 40 of potassium.  Repeat BMP in a.m. with mag.  #6 chronic persistent atrial fibrillation restarting Coreg.  Echo ordered.  Eliquis on hold for concern for GI bleed.  #7gi bleed-fobt positive has black stools.  GI consulted.  Continue Protonix.  Received Andexxa in the ED.  #8 seizures on Keppra  #9 AKI improving secondary to dehydration hypotension hypovolemia continue fluids  #10 thrombocytopenia likely due to alcohol abuse   Estimated body mass index is 23.75 kg/m as calculated from the following:   Height as of this encounter: 6\' 1"  (1.854 m).   Weight as of this encounter: 81.6 kg.  DVT prophylaxis: SCD  code Status: Full code Family Communication: None at bedside attempted to call his wife on home number and cell number unable to reach  disposition Plan:  Status is: Inpatient  Dispo: The patient is from: Home              Anticipated d/c is to: Home              Patient currently is not medically stable to d/c.   Difficult to place patient No  Consultants: gi  Procedures: None Antimicrobials: None  Subjective: Resting in bed he is awake and alert answers all my questions appropriately No bowel movements while in the ED no nausea vomiting  Objective: Vitals:   04/28/21 0600 04/28/21 0823 04/28/21 1115 04/28/21 1203  BP: 123/65 136/74 120/67 132/73  Pulse: 86 95 100 (!) 115  Resp: 19 20 (!) 21 18  Temp:  97.8 F (36.6 C)  98.3 F (36.8 C)  TempSrc:  Oral  Oral  SpO2: 100% 100% 99% 100%  Weight:      Height:        Intake/Output Summary (Last 24 hours) at 04/28/2021 1419 Last data filed at 04/28/2021 0141 Gross per 24 hour  Intake 3590 ml  Output 1 ml  Net 3589 ml   Filed Weights   05/01/2021  1609  Weight: 81.6 kg    Examination:  General exam: Appears in mild distress  respiratory system: Clear to auscultation. Respiratory effort normal. Cardiovascular system: S1 & S2 heard, RRR. No JVD, murmurs, rubs, gallops or clicks. No pedal edema. Gastrointestinal system: Abdomen is distended, soft and nontender. No organomegaly or masses felt. Normal bowel sounds heard. Central nervous system: Alert and oriented. No focal neurological deficits. Extremities: Trace edema. Skin: No rashes, lesions or ulcers Psychiatry: Judgement and insight appear normal. Mood & affect appropriate.     Data Reviewed: I have personally reviewed following labs and imaging studies  CBC: Recent Labs  Lab 05/12/2021 1605 05/08/2021 1701 04/28/21 0500  WBC 9.2  --  8.8  NEUTROABS 6.4  --   --   HGB 11.4* 11.6* 9.5*  HCT 35.1* 34.0* 29.9*  MCV 93.1  --  94.9  PLT 70*  --  58*   Basic Metabolic Panel: Recent Labs  Lab 04/26/2021 1605 05/07/2021 1701 04/28/21 0500  NA 134* 132* 136  K 3.8 3.9 3.7  CL 92* 95* 100  CO2 15*  --  14*  GLUCOSE 70 69* 123*  BUN 34* 31* 28*  CREATININE 2.17* 2.70* 1.69*  CALCIUM 7.6*  --  7.3*  MG  --   --  2.4   GFR: Estimated Creatinine Clearance: 42.7 mL/min (A) (by C-G formula based on SCr of 1.69 mg/dL (H)). Liver Function Tests: Recent Labs  Lab 04/30/2021 1605  AST 146*  ALT 91*  ALKPHOS 51  BILITOT 1.1  PROT 5.7*  ALBUMIN 3.3*   No results for input(s): LIPASE, AMYLASE in the last 168 hours. No results for input(s): AMMONIA in the last 168 hours. Coagulation Profile: Recent Labs  Lab 04/26/2021 1605  INR 1.6*   Cardiac Enzymes: No results for input(s): CKTOTAL, CKMB, CKMBINDEX, TROPONINI in the last 168 hours. BNP (last 3 results) No results for input(s): PROBNP in the last 8760 hours. HbA1C: No results for input(s): HGBA1C in the last 72 hours. CBG: No results for input(s): GLUCAP in the last 168 hours. Lipid Profile: No results for  input(s): CHOL, HDL, LDLCALC, TRIG, CHOLHDL, LDLDIRECT in the last 72 hours. Thyroid Function Tests: No results for input(s): TSH, T4TOTAL, FREET4, T3FREE, THYROIDAB in the last 72 hours. Anemia Panel: No results for input(s): VITAMINB12, FOLATE, FERRITIN, TIBC, IRON, RETICCTPCT in the last 72 hours. Sepsis Labs: Recent Labs  Lab 05/15/2021 2200 04/23/2021 2351 04/28/21 0500 04/28/21 1222  LATICACIDVEN 3.5* 4.3* 4.0* 1.8    Recent Results (from the past 240 hour(s))  Resp Panel by RT-PCR (Flu A&B, Covid) Nasopharyngeal Swab     Status: None   Collection Time:  05/14/2021  4:05 PM   Specimen: Nasopharyngeal Swab; Nasopharyngeal(NP) swabs in vial transport medium  Result Value Ref Range Status   SARS Coronavirus 2 by RT PCR NEGATIVE NEGATIVE Final    Comment: (NOTE) SARS-CoV-2 target nucleic acids are NOT DETECTED.  The SARS-CoV-2 RNA is generally detectable in upper respiratory specimens during the acute phase of infection. The lowest concentration of SARS-CoV-2 viral copies this assay can detect is 138 copies/mL. A negative result does not preclude SARS-Cov-2 infection and should not be used as the sole basis for treatment or other patient management decisions. A negative result may occur with  improper specimen collection/handling, submission of specimen other than nasopharyngeal swab, presence of viral mutation(s) within the areas targeted by this assay, and inadequate number of viral copies(<138 copies/mL). A negative result must be combined with clinical observations, patient history, and epidemiological information. The expected result is Negative.  Fact Sheet for Patients:  EntrepreneurPulse.com.au  Fact Sheet for Healthcare Providers:  IncredibleEmployment.be  This test is no t yet approved or cleared by the [place] FDA and  has been authorized for detection and/or diagnosis of SARS-CoV-2 by FDA under an Emergency Use Authorization  (EUA). This EUA will remain  in effect (meaning this test can be used) for the duration of the COVID-19 declaration under Section 564(b)(1) of the Act, 21 U.S.C.section 360bbb-3(b)(1), unless the authorization is terminated  or revoked sooner.       Influenza A by PCR NEGATIVE NEGATIVE Final   Influenza B by PCR NEGATIVE NEGATIVE Final    Comment: (NOTE) The Xpert Xpress SARS-CoV-2/FLU/RSV plus assay is intended as an aid in the diagnosis of influenza from Nasopharyngeal swab specimens and should not be used as a sole basis for treatment. Nasal washings and aspirates are unacceptable for Xpert Xpress SARS-CoV-2/FLU/RSV testing.  Fact Sheet for Patients: EntrepreneurPulse.com.au  Fact Sheet for Healthcare Providers: IncredibleEmployment.be  This test is not yet approved or cleared by the [place] FDA and has been authorized for detection and/or diagnosis of SARS-CoV-2 by FDA under an Emergency Use Authorization (EUA). This EUA will remain in effect (meaning this test can be used) for the duration of the COVID-19 declaration under Section 564(b)(1) of the Act, 21 U.S.C. section 360bbb-3(b)(1), unless the authorization is terminated or revoked.  Performed at [facility], [place] [address], [place], [place] [address]   Blood Culture (routine x 2)     Status: None (Preliminary result)   Collection Time: 05/08/2021  4:40 PM   Specimen: BLOOD  Result Value Ref Range Status   Specimen Description   Final    BLOOD LEFT ANTECUBITAL Performed at [facility] [address], [place], [place] [address]    Special Requests   Final    BOTTLES DRAWN AEROBIC AND ANAEROBIC Blood Culture adequate volume Performed at [facility] [address] [place], [place] [address]    Culture   Final    NO GROWTH < 12 HOURS Performed at [facility] [address], [place], [place] [address]     Report Status PENDING  Incomplete  Blood Culture (routine x 2)     Status: None (Preliminary result)   Collection Time: 04/30/2021  4:40 PM   Specimen: BLOOD  Result Value Ref Range Status   Specimen Description   Final    BLOOD RIGHT ANTECUBITAL Performed at [facility] [address] [place], [place] [address]    Special Requests   Final  BOTTLES DRAWN AEROBIC AND ANAEROBIC Blood Culture adequate volume Performed at [facility] [address], [place], [place] [address]    Culture   Final    NO GROWTH < 12 HOURS Performed at [facility] [address] [place],  [address]    Report Status PENDING  Incomplete         Radiology Studies: DG Chest Port 1 View  Result Date: 04/24/2021 CLINICAL DATA:  Possible sepsis.  Alcohol abuse.  Hypotension. EXAM: PORTABLE CHEST 1 VIEW COMPARISON:  04/18/2020 FINDINGS: Interval patient rotation to the left. No gross cardiac enlargement. Stable mild pleural and parenchymal scarring at the left lung base. Minimal linear atelectasis or scarring at the right lung base. Thoracic spine degenerative changes. IMPRESSION: No acute abnormality. Electronically Signed   By: Claudie Revering M.D.   On: 05/17/2021 16:28        Scheduled Meds: . carvedilol  3.125 mg Oral BID WC  . cholecalciferol  2,000 Units Oral Daily  . ferrous sulfate  325 mg Oral Q breakfast  . folic acid  1 mg Oral Daily  . levETIRAcetam  500 mg Oral BID  . multivitamin with minerals  1 tablet Oral Daily  . thiamine  100 mg Oral Daily   Or  . thiamine  100 mg Intravenous Daily  . vitamin B-12  1,000 mcg Oral Daily   Continuous Infusions: . lactated ringers 125 mL/hr at 04/28/21 1211     LOS: 0 days   Georgette Shell, MD  04/28/2021, 2:19 PM

## 2021-04-28 NOTE — ED Notes (Signed)
Report received from [place], Location manager.

## 2021-04-28 NOTE — ED Notes (Signed)
Discussed with hospitalist, updated plan for pt. Per Dr. Rodena Piety, pt to continue IV fluids, and will repeat BMP and ABG at 1400pm today.

## 2021-04-28 NOTE — ED Notes (Signed)
Notified floor coverage of lactic acid.

## 2021-04-29 ENCOUNTER — Other Ambulatory Visit (HOSPITAL_COMMUNITY): Payer: Medicare Other

## 2021-04-29 ENCOUNTER — Encounter (HOSPITAL_COMMUNITY): Payer: Self-pay | Admitting: Internal Medicine

## 2021-04-29 ENCOUNTER — Inpatient Hospital Stay (HOSPITAL_COMMUNITY): Payer: Medicare Other

## 2021-04-29 ENCOUNTER — Inpatient Hospital Stay (HOSPITAL_COMMUNITY): Payer: Medicare Other | Admitting: Anesthesiology

## 2021-04-29 ENCOUNTER — Encounter (HOSPITAL_COMMUNITY): Admission: EM | Disposition: E | Payer: Self-pay | Source: Home / Self Care | Attending: Internal Medicine

## 2021-04-29 ENCOUNTER — Encounter: Payer: Self-pay | Admitting: Internal Medicine

## 2021-04-29 DIAGNOSIS — K2970 Gastritis, unspecified, without bleeding: Secondary | ICD-10-CM

## 2021-04-29 DIAGNOSIS — K3189 Other diseases of stomach and duodenum: Secondary | ICD-10-CM

## 2021-04-29 DIAGNOSIS — I480 Paroxysmal atrial fibrillation: Secondary | ICD-10-CM

## 2021-04-29 DIAGNOSIS — K2990 Gastroduodenitis, unspecified, without bleeding: Secondary | ICD-10-CM

## 2021-04-29 HISTORY — PX: BIOPSY: SHX5522

## 2021-04-29 HISTORY — PX: ESOPHAGOGASTRODUODENOSCOPY (EGD) WITH PROPOFOL: SHX5813

## 2021-04-29 LAB — COMPREHENSIVE METABOLIC PANEL
ALT: 57 U/L — ABNORMAL HIGH (ref 0–44)
AST: 63 U/L — ABNORMAL HIGH (ref 15–41)
Albumin: 2.8 g/dL — ABNORMAL LOW (ref 3.5–5.0)
Alkaline Phosphatase: 48 U/L (ref 38–126)
Anion gap: 8 (ref 5–15)
BUN: 15 mg/dL (ref 8–23)
CO2: 26 mmol/L (ref 22–32)
Calcium: 8.2 mg/dL — ABNORMAL LOW (ref 8.9–10.3)
Chloride: 103 mmol/L (ref 98–111)
Creatinine, Ser: 1.13 mg/dL (ref 0.61–1.24)
GFR, Estimated: >60 mL/min (ref >60)
Glucose, Bld: 109 mg/dL — ABNORMAL HIGH (ref 70–99)
Potassium: 3.1 mmol/L — ABNORMAL LOW (ref 3.5–5.1)
Sodium: 137 mmol/L (ref 135–145)
Total Bilirubin: 1.5 mg/dL — ABNORMAL HIGH (ref 0.3–1.2)
Total Protein: 5 g/dL — ABNORMAL LOW (ref 6.5–8.1)

## 2021-04-29 LAB — CBC
HCT: 25.2 % — ABNORMAL LOW (ref 39.0–52.0)
HCT: 26.4 % — ABNORMAL LOW (ref 39.0–52.0)
HCT: 27.2 % — ABNORMAL LOW (ref 39.0–52.0)
Hemoglobin: 8.4 g/dL — ABNORMAL LOW (ref 13.0–17.0)
Hemoglobin: 8.9 g/dL — ABNORMAL LOW (ref 13.0–17.0)
Hemoglobin: 9.1 g/dL — ABNORMAL LOW (ref 13.0–17.0)
MCH: 30.4 pg (ref 26.0–34.0)
MCH: 30.8 pg (ref 26.0–34.0)
MCH: 30.5 pg (ref 26.0–34.0)
MCHC: 33.3 g/dL (ref 30.0–36.0)
MCHC: 33.7 g/dL (ref 30.0–36.0)
MCHC: 33.5 g/dL (ref 30.0–36.0)
MCV: 91.3 fL (ref 80.0–100.0)
MCV: 91.3 fL (ref 80.0–100.0)
MCV: 91.3 fL (ref 80.0–100.0)
Platelets: 45 10*3/uL — ABNORMAL LOW (ref 150–400)
Platelets: 48 10*3/uL — ABNORMAL LOW (ref 150–400)
Platelets: 52 10*3/uL — ABNORMAL LOW (ref 150–400)
RBC: 2.76 MIL/uL — ABNORMAL LOW (ref 4.22–5.81)
RBC: 2.89 MIL/uL — ABNORMAL LOW (ref 4.22–5.81)
RBC: 2.98 MIL/uL — ABNORMAL LOW (ref 4.22–5.81)
RDW: 15.9 % — ABNORMAL HIGH (ref 11.5–15.5)
RDW: 15.6 % — ABNORMAL HIGH (ref 11.5–15.5)
RDW: 15.8 % — ABNORMAL HIGH (ref 11.5–15.5)
WBC: 5.4 10*3/uL (ref 4.0–10.5)
WBC: 5.5 10*3/uL (ref 4.0–10.5)
WBC: 4.6 10*3/uL (ref 4.0–10.5)
nRBC: 0 % (ref 0.0–0.2)
nRBC: 0 % (ref 0.0–0.2)
nRBC: 0 % (ref 0.0–0.2)

## 2021-04-29 LAB — URINE CULTURE

## 2021-04-29 LAB — MAGNESIUM: Magnesium: 1.8 mg/dL (ref 1.7–2.4)

## 2021-04-29 SURGERY — ESOPHAGOGASTRODUODENOSCOPY (EGD) WITH PROPOFOL
Anesthesia: Monitor Anesthesia Care

## 2021-04-29 MED ORDER — PROPOFOL 10 MG/ML IV BOLUS
INTRAVENOUS | Status: DC | PRN
  Administered 2021-04-29 (×2): 20 mg via INTRAVENOUS

## 2021-04-29 MED ORDER — PROPOFOL 500 MG/50ML IV EMUL
INTRAVENOUS | Status: DC | PRN
  Administered 2021-04-29: 125 ug/kg/min via INTRAVENOUS

## 2021-04-29 MED ORDER — LIDOCAINE 2% (20 MG/ML) 5 ML SYRINGE
INTRAMUSCULAR | Status: DC | PRN
  Administered 2021-04-29: 100 mg via INTRAVENOUS

## 2021-04-29 MED ORDER — SODIUM CHLORIDE 0.9 % IV SOLN
INTRAVENOUS | Status: DC
  Administered 2021-04-29: 1000 mL via INTRAVENOUS

## 2021-04-29 MED ORDER — PROPOFOL 500 MG/50ML IV EMUL
INTRAVENOUS | Status: AC
  Filled 2021-04-29: qty 50

## 2021-04-29 MED ORDER — PANTOPRAZOLE SODIUM 40 MG PO TBEC
40.0000 mg | DELAYED_RELEASE_TABLET | Freq: Every day | ORAL | Status: DC
  Administered 2021-04-30 – 2021-05-02 (×3): 40 mg via ORAL
  Filled 2021-04-29 (×4): qty 1

## 2021-04-29 MED ORDER — LEVETIRACETAM IN NACL 500 MG/100ML IV SOLN
500.0000 mg | Freq: Once | INTRAVENOUS | Status: AC
  Administered 2021-04-29: 500 mg via INTRAVENOUS
  Filled 2021-04-29: qty 100

## 2021-04-29 MED ORDER — POTASSIUM CHLORIDE CRYS ER 20 MEQ PO TBCR: 80.0000 meq | EXTENDED_RELEASE_TABLET | Freq: Once | ORAL | Status: DC

## 2021-04-29 MED ORDER — METOPROLOL TARTRATE 5 MG/5ML IV SOLN
INTRAVENOUS | Status: DC | PRN
  Administered 2021-04-29 (×2): 2 mg via INTRAVENOUS

## 2021-04-29 MED ORDER — MAGNESIUM SULFATE 2 GM/50ML IV SOLN
2.0000 g | Freq: Once | INTRAVENOUS | Status: AC
  Administered 2021-04-29: 2 g via INTRAVENOUS
  Filled 2021-04-29: qty 50

## 2021-04-29 MED ORDER — POTASSIUM CHLORIDE 10 MEQ/100ML IV SOLN
10.0000 meq | INTRAVENOUS | Status: AC
  Administered 2021-04-29 (×4): 10 meq via INTRAVENOUS
  Filled 2021-04-29 (×4): qty 100

## 2021-04-29 MED ORDER — POTASSIUM CHLORIDE CRYS ER 20 MEQ PO TBCR
40.0000 meq | EXTENDED_RELEASE_TABLET | Freq: Once | ORAL | Status: DC
  Filled 2021-04-29: qty 2

## 2021-04-29 SURGICAL SUPPLY — 15 items

## 2021-04-29 NOTE — Anesthesia Preprocedure Evaluation (Signed)
Anesthesia Evaluation  Patient identified by MRN, date of birth, ID band Patient confused    Reviewed: Allergy & Precautions, NPO status , Patient's Chart, lab work & pertinent test results  Airway Mallampati: II  TM Distance: >3 FB Neck ROM: Full    Dental  (+) Teeth Intact   Pulmonary neg pulmonary ROS, former smoker,    Pulmonary exam normal        Cardiovascular hypertension, Pt. on medications and Pt. on home beta blockers  Rhythm:Regular Rate:Normal     Neuro/Psych Seizures -,     GI/Hepatic GERD  ,(+)     substance abuse (on CIWA)  alcohol use, GIB   Endo/Other  negative endocrine ROS  Renal/GU   negative genitourinary   Musculoskeletal negative musculoskeletal ROS (+)   Abdominal (+)  Abdomen: soft. Bowel sounds: normal.  Peds  Hematology  (+) anemia ,   Anesthesia Other Findings   Reproductive/Obstetrics                           Anesthesia Physical Anesthesia Plan  ASA: 3  Anesthesia Plan: MAC   Post-op Pain Management:    Induction: Intravenous  PONV Risk Score and Plan: 1 and Propofol infusion and Treatment may vary due to age or medical condition  Airway Management Planned: Simple Face Mask, Natural Airway and Nasal Cannula  Additional Equipment: None  Intra-op Plan:   Post-operative Plan:   Informed Consent: I have reviewed the patients History and Physical, chart, labs and discussed the procedure including the risks, benefits and alternatives for the proposed anesthesia with the patient or authorized representative who has indicated his/her understanding and acceptance.     Dental advisory given  Plan Discussed with: CRNA  Anesthesia Plan Comments: (Lab Results      Component                Value               Date                      WBC                      5.5                 05/10/2021                HGB                      8.9 (L)              05/17/2021                HCT                      26.4 (L)            05/04/2021                MCV                      91.3                05/12/2021                PLT  48 (L)              05/09/2021           Lab Results      Component                Value               Date                      NA                       137                 05/11/2021                K                        3.1 (L)             04/24/2021                CO2                      26                  05/04/2021                GLUCOSE                  109 (H)             05/01/2021                BUN                      15                  05/17/2021                CREATININE               1.13                05/08/2021                CALCIUM                  8.2 (L)             05/02/2021                GFRNONAA                 >60                 05/14/2021                GFRAA                    >60                 04/23/2020          )       Anesthesia Quick Evaluation

## 2021-04-29 NOTE — Progress Notes (Addendum)
Progress Note  Patient Name: Sergio Mack Date of Encounter: 05/10/2021  [facility] Cardiologist: Thompson Grayer, MD   Subjective   Sleepy/sedated no chest pain  Inpatient Medications    Scheduled Meds:  carvedilol  3.125 mg Oral BID WC   cholecalciferol  2,000 Units Oral Daily   ferrous sulfate  325 mg Oral Q breakfast   folic acid  1 mg Oral Daily   levETIRAcetam  500 mg Oral BID   multivitamin with minerals  1 tablet Oral Daily   [START ON 05/02/2021] pantoprazole  40 mg Intravenous Q12H   potassium chloride  80 mEq Oral Once   thiamine  100 mg Oral Daily   Or   thiamine  100 mg Intravenous Daily   vitamin B-12  1,000 mcg Oral Daily   Continuous Infusions:  pantoprozole (PROTONIX) infusion 8 mg/hr (05/01/2021 0324)   PRN Meds: LORazepam **OR** LORazepam   Vital Signs    Vitals:   04/28/21 2321 04/28/2021 0259 04/24/2021 0300 05/20/2021 0647  BP: (!) 111/47 (!) 121/58 (!) 121/58 119/64  Pulse: 97 (!) 112 93 (!) 101  Resp: 18 18 20 20   Temp: 99.6 F (37.6 C) 98.7 F (37.1 C) 98.7 F (37.1 C) 98.1 F (36.7 C)  TempSrc: Oral Oral    SpO2: 98% 97% 97% 99%  Weight:      Height:        Intake/Output Summary (Last 24 hours) at 05/05/2021 0811 Last data filed at 04/28/2021 2339 Gross per 24 hour  Intake 1686.38 ml  Output --  Net 1686.38 ml   Last 3 Weights 05/05/2021 02/03/2021 08/06/2020  Weight (lbs) 180 lb 192 lb 3.2 oz 174 lb 12.8 oz  Weight (kg) 81.647 kg 87.181 kg 79.289 kg      Telemetry    SR to ST at [facility] - Personally Reviewed  ECG    No new - Personally Reviewed  Physical Exam   GEN: No acute distress.   Neck: No JVD Cardiac: RRR, no murmurs, rubs, or gallops.  Respiratory: Clear to auscultation bilaterally. GI: Soft, nontender, non-distended  MS: No edema; No deformity. Neuro:  Nonfocal sedated Psych: Normal affect  SKIN:  + bruising on arms   Labs    High Sensitivity Troponin:  No results for input(s): TROPONINIHS in the last 720  hours.    Chemistry Recent Labs  Lab 05/20/2021 1605 05/13/2021 1701 04/28/21 0500 04/28/21 1516 05/11/2021 0515  NA 134*   < > 136 137 137  K 3.8   < > 3.7 3.1* 3.1*  CL 92*   < > 100 114* 103  CO2 15*  --  14* 15* 26  GLUCOSE 70   < > 123* 153* 109*  BUN 34*   < > 28* 17 15  CREATININE 2.17*   < > 1.69* 1.03 1.13  CALCIUM 7.6*  --  7.3* 5.6* 8.2*  PROT 5.7*  --   --   --  5.0*  ALBUMIN 3.3*  --   --   --  2.8*  AST 146*  --   --   --  63*  ALT 91*  --   --   --  57*  ALKPHOS 51  --   --   --  48  BILITOT 1.1  --   --   --  1.5*  GFRNONAA 31*  --  42* >60 >60  ANIONGAP 27*  --  >20* 8 8   < > = values in  this interval not displayed.     Hematology Recent Labs  Lab 04/28/21 1619 05/11/2021 0515 05/04/2021 0744  WBC 7.4 5.4 5.5  RBC 3.27* 2.76* 2.89*  HGB 10.0* 8.4* 8.9*  HCT 29.9* 25.2* 26.4*  MCV 91.4 91.3 91.3  MCH 30.6 30.4 30.8  MCHC 33.4 33.3 33.7  RDW 15.8* 15.9* 15.6*  PLT 52* 45* 48*    BNPNo results for input(s): BNP, PROBNP in the last 168 hours.   DDimer No results for input(s): DDIMER in the last 168 hours.   Radiology    DG Chest Port 1 View  Result Date: 05/04/2021 CLINICAL DATA:  Possible sepsis.  Alcohol abuse.  Hypotension. EXAM: PORTABLE CHEST 1 VIEW COMPARISON:  04/18/2020 FINDINGS: Interval patient rotation to the left. No gross cardiac enlargement. Stable mild pleural and parenchymal scarring at the left lung base. Minimal linear atelectasis or scarring at the right lung base. Thoracic spine degenerative changes. IMPRESSION: No acute abnormality. Electronically Signed   By: Claudie Revering M.D.   On: 05/17/2021 16:28    Cardiac Studies   Echo scheduled for today  Patient Profile     75 y.o. male with a hx of PAF, RBBB, HTN, HLD, prediabetes and a history of alcohol withdrawal and now due to depression of son's death, has been drinking more and not eating.  Admitted with heme + stools, hypotension responded to fluids, and prolonged QTC.Marland Kitchen pt was on  Eliquis and given andexxa to reverse..   Assessment & Plan   Severe dehydration secondary to alcohol abuse and poor oral intake             -Related to significant alcohol abuse and poor oral intake after the loss of his son last month.  His wife has been giving him all of his blood pressure medications despite overall poor oral intake.  This likely exacerbated his hypotension.             -BP improved after hydration.  All home blood pressure medications has been stopped during this admission.   --hypokalemia continued at 3.1 Mg+ 1.8 - K+ >4.0 and Mg+ >2.0 to resume tikosyn.     Alcohol abuse             -Started in early May after learning his son's death.  Patient is the mourning             -Wife says he likely drank 5 and half gallons of vodka since early May.             -CIWA protocol.  Consider repeat echocardiogram   Tachycardia with prolonged QTC:             -Related to heavy alcohol use, electrolyte derangement along with acute kidney injury.  Renal function is improving, blood pressure also improved.  Repeat EKG shows improving QTC as well.             -His beta-blocker, Tikosyn and Eliquis were all discontinued on arrival.             -Likely will restart Tikosyn once renal function and electrolyte returned back to normal.  Would recommend restart beta-blocker for now at very low-dose in order to avoid having the patient go back into atrial fibrillation.  Heart rate in the 110s to 120s at this time while in sinus tachycardia.   Persistent atrial fibrillation: Previously underwent TEE DCCV in January 2019 and started on Tikosyn at that time.  Despite recent heavy alcohol use, family  member has been giving him all of his medications.  Tikosyn on hold with prolonged Qtc improved and hypokalemia.  Maintaining SR to ST  need to monitor off tikosyn    Hypertension: Hypotensive on arrival due to poor oral intake and alcohol abuse   Hyperlipidemia: Statin held in light of elevated liver  enzyme secondary to alcohol abuse   Prediabetes   Right bundle branch block chronic   Acute GI bleed: Family member noticed dark stools started last month.  Stool Hemoccult was positive. (The negative results in epic system was incorrect, stool Hemoccult obtained by ED physician was positive.)  Patient was given [place] by ED physician due to concern of massive GI bleed prior to lab result.  Hemoglobin eventually came back reassuring at 11 on admit.  Overnight, hemoglobin dropped down to 8.9 .  Pending GI consult. Thrombocytopenia with plts 48k Hypokalemia replacing, and hypo mg+ both need to be stable before resuming tikosyn.         For questions or updates, please contact [facility] Please consult [email or web address] for contact info under        Signed, Cecilie Kicks, NP  04/28/2021, 8:11 AM     Patient examined chart reviewed. Lethargic with bruising left elbow area and legs. Lungs clear no murmur Echo today. Telemetry with NSR improved QT I started his coreg back yesterday will likely restart Tikosyn in next 48 hours as electrolytes improve Continue to hold anticoagulation   Jenkins Rouge MD [facility]

## 2021-04-29 NOTE — Op Note (Signed)
[facility] Patient Name: Sergio Mack Procedure Date: 05/08/2021 MRN: [id number] Attending MD: Jerene Bears , MD Date of Birth: 17-Apr-1946 CSN: [id number] Age: 75 Admit Type: Inpatient Procedure:                Upper GI endoscopy Indications:              Acute post hemorrhagic anemia, Melena Providers:                Lajuan Lines. Hilarie Fredrickson, MD, Dulcy Fanny, Cletis Athens,                            Technician, Danley Danker, CRNA Referring MD:             [facility] Medicines:                Monitored Anesthesia Care Complications:            No immediate complications. Estimated Blood Loss:     Estimated blood loss was minimal. Procedure:                Pre-Anesthesia Assessment:                           - Prior to the procedure, a History and Physical                            was performed, and patient medications and                            allergies were reviewed. The patient's tolerance of                            previous anesthesia was also reviewed. The risks                            and benefits of the procedure and the sedation                            options and risks were discussed with the patient.                            All questions were answered, and informed consent                            was obtained. Prior Anticoagulants: The patient has                            taken no previous anticoagulant or antiplatelet                            agents. ASA Grade Assessment: III - A patient with                            severe systemic disease. After reviewing the risks  and benefits, the patient was deemed in                            satisfactory condition to undergo the procedure.                           After obtaining informed consent, the endoscope was                            passed under direct vision. Throughout the                            procedure, the patient's blood pressure,  pulse, and                            oxygen saturations were monitored continuously. The                            GIF-H190 ([id number] Olympus gastroscope was                            introduced through the mouth, and advanced to the                            second part of duodenum. The upper GI endoscopy was                            accomplished without difficulty. The patient                            tolerated the procedure well. Scope In: Scope Out: Findings:      The esophagus and gastroesophageal junction were examined with white       light and narrow band imaging (NBI) from a forward view and retroflexed       position. There were esophageal mucosal changes consistent with       long-segment Barrett's esophagus. These changes involved the mucosa at       the upper extent of the gastric folds (38 cm from the incisors)       extending to the Z-line (32 cm from the incisors). No visible       abnormalities were present. The maximum longitudinal extent of these       esophageal mucosal changes was 6 cm in length. Extensive biopsies not       done today given recent GI bleeding.      A 2 cm hiatal hernia was present.      Scattered mild inflammation characterized by congestion (edema),       erosions and granularity was found in the gastric fundus and in the       gastric body. Biopsies were taken with a cold forceps for histology and       Helicobacter pylori testing.      Diffuse granular mucosa was found in the duodenal bulb and in the second       portion of the duodenum. To evidence of bleeding in examined duodenum.       Biopsies were taken with a  cold forceps for histology. Impression:               - Esophageal mucosal changes consistent with                            long-segment Barrett's esophagus. Biopsies not                            performed today.                           - 2 cm hiatal hernia.                           - Gastritis with erosions, likely  alcohol related.                            Biopsied to exclude H. Pylori.                           - Granular mucosa in the duodenal bulb and in the                            second portion of the duodenum, likely peptic in                            nature. Biopsied.                           - No evidence of active or recent bleeding on                            today's exam. Gastritis with erosions in setting of                            alcohol and anticoagulation may possibly explain                            melena. Moderate Sedation:      N/A Recommendation:           - Return patient to hospital ward for ongoing care.                           - Advance diet as tolerated.                           - Await pathology results.                           - Would use pantoprazole (or equivalent) 40 mg                            daily.                           - Will arrange office follow-up to address and  consider repeat EGD with appropriate Barrett's                            biopsies, follow-up on alcohol abuse with mild                            alcoholic hepatitis, and consider outpatient                            MRI/MRCP pancreas protocol to exclude pancreatic                            lesion based on recent abd [place].                           - Would ask social work to help with alcohol                            relapse prevention and rehab options. Patient has                            attended AA in the past and this is again                            recommended if he is committed to sobriety.                           - GI will sign off unless needed while                            hospitalized. We will arrange GI followup with                            [facility]. Procedure Code(s):        --- Professional ---                           [phone], Esophagogastroduodenoscopy, flexible,                            transoral; with biopsy,  single or multiple Diagnosis Code(s):        --- Professional ---                           K22.8, Other specified diseases of esophagus                           K44.9, Diaphragmatic hernia without obstruction or                            gangrene                           K29.70, Gastritis, unspecified, without bleeding  K31.89, Other diseases of stomach and duodenum                           D62, Acute posthemorrhagic anemia                           K92.1, Melena (includes Hematochezia) CPT copyright 2019 American Medical Association. All rights reserved. The codes documented in this report are preliminary and upon coder review may  be revised to meet current compliance requirements. Jerene Bears, MD 04/26/2021 2:03:10 PM This report has been signed electronically. Number of Addenda: 0

## 2021-04-29 NOTE — Anesthesia Procedure Notes (Signed)
Date/Time: 05/10/2021 1:29 PM Performed by: Sharlette Dense, CRNA Oxygen Delivery Method: Simple face mask

## 2021-04-29 NOTE — Progress Notes (Signed)
Dear Doctor: Sergio Mack This patient has been identified as a candidate for PICC for the following reason (s): IV therapy over 48 hours, drug extravasation potential with tissue necrosis (KCL, Dilantin, Dopamine, CaCl, MgSO4, chemo vesicant), and poor veins/poor circulatory system (CHF, COPD, emphysema, diabetes, steroid use, IV drug abuse, etc.) If you agree, please write an order for the indicated device. For any questions contact the Vascular Access Team at [phone] if no answer, please leave a message.  Thank you for supporting the early vascular access assessment program.

## 2021-04-29 NOTE — Interval H&P Note (Signed)
History and Physical Interval Note: For EGD today to eval melena The nature of the procedure, as well as the risks, benefits, and alternatives were carefully and thoroughly reviewed with the patient. Ample time for discussion and questions allowed. The patient understood, was satisfied, and agreed to proceed.   CBC Latest Ref Rng & Units 05/11/2021 05/02/2021 04/28/2021  WBC 4.0 - 10.5 K/uL 5.5 5.4 7.4  Hemoglobin 13.0 - 17.0 g/dL 8.9(L) 8.4(L) 10.0(L)  Hematocrit 39.0 - 52.0 % 26.4(L) 25.2(L) 29.9(L)  Platelets 150 - 400 K/uL 48(L) 45(L) 52(L)   Lab Results  Component Value Date   INR 1.6 (H) 04/26/2021   INR 1.3 (H) 04/18/2020   INR 1.01 04/23/2014      05/14/2021 1:25 PM  Sergio Mack  has presented today for surgery, with the diagnosis of Gastrointestinal bleeding.  The various methods of treatment have been discussed with the patient and family. After consideration of risks, benefits and other options for treatment, the patient has consented to  Procedure(s): ESOPHAGOGASTRODUODENOSCOPY (EGD) WITH PROPOFOL (N/A) as a surgical intervention.  The patient's history has been reviewed, patient examined, no change in status, stable for surgery.  I have reviewed the patient's chart and labs.  Questions were answered to the patient's satisfaction.     [name]

## 2021-04-29 NOTE — Progress Notes (Signed)
Assumed care of patient, I agree with previous RN assessment. I will continue to monitor patient.  Wynona Neat, RN

## 2021-04-29 NOTE — Procedures (Addendum)
I spoke at length with wife after procedure regarding EGD findings and plan of care Time provided for questions and answers and she thanked me for the call

## 2021-04-29 NOTE — Transfer of Care (Signed)
Immediate Anesthesia Transfer of Care Note  Patient: Sergio Mack  Procedure(s) Performed: ESOPHAGOGASTRODUODENOSCOPY (EGD) WITH PROPOFOL BIOPSY  Patient Location: Endoscopy Unit  Anesthesia Type:MAC  Level of Consciousness: drowsy  Airway & Oxygen Therapy: Patient Spontanous Breathing and Patient connected to face mask oxygen  Post-op Assessment: Report given to RN and Post -op Vital signs reviewed and stable  Post vital signs: Reviewed and stable  Last Vitals:  Vitals Value Taken Time  BP    Temp    Pulse    Resp    SpO2      Last Pain:  Vitals:   05/15/2021 1243  TempSrc: Temporal  PainSc: 0-No pain         Complications: No notable events documented.

## 2021-04-29 NOTE — Progress Notes (Signed)
PT Cancellation Note  Patient Details Name: KAZ AULD MRN: [id number] DOB: 07/15/1946   Cancelled Treatment:    Reason Eval/Treat Not Completed: Other (comment) (pt sleeping). Per nursing, pt sleeping soundly, requested therapy check back at later time as able.   Tori Melis Trochez PT, DPT 04/28/2021, 9:22 AM

## 2021-04-29 NOTE — Progress Notes (Signed)
Initial Nutrition Assessment  DOCUMENTATION CODES:   Not applicable  INTERVENTION:  - diet advancement as medically feasible. - will monitor for need for nutrition-related interventions once diet advanced. - complete NFPE when feasible.    NUTRITION DIAGNOSIS:   Inadequate oral intake related to inability to eat, acute illness as evidenced by NPO status, per patient/family report.  GOAL:   Patient will meet greater than or equal to 90% of their needs  MONITOR:   Diet advancement, Labs, Weight trends  REASON FOR ASSESSMENT:   Malnutrition Screening Tool  ASSESSMENT:   75 y.o. male with medical history of HFrEF, HTN, persistent afib on Eliquis, alcohol abuse with hx of alcohol withdrawal seizures, and pre-diabetes. His wife took him to the ED d/t concern for heavy alcohol use. Patient reported drinking heavily x2 weeks after his son died; the cause of death is still unknown. Patient reported drinking up to  gallon of vodka every 2 days and not eating much or drinking other beverages. He denied N/V or abdominal pain but was having dark-colored diarrhea; reported he takes iron supplement. His last alcohol drink was the morning of 6/7.  Diet advanced from NPO to Heart Healthy on 6/7 at 2018, changed to Soft at 2156, downgraded to CLD yesterday at 1604, and then made NPO at midnight last night. The only documented meal intake was 50% of dinner last night (on CLD).  Patient currently out of the room. He has not been seen by a [place] RD at any time in the past.   Weight on 6/7 was documented as 180 lb and appears to be a stated weight. PTA the most recently documented weight was on 02/03/21 when he weighed 192 lb. This indicates 12 lb weight loss (6.2% body weight) in the past 3 months; not significant for time frame.   Per notes: - severe dehydration on admission - acute GIB, stool hemoccult positive - GI and Cardiology following   Labs reviewed; K: 3.1 mmol/l, Ca: 8.2  mg/dl, LFTs slightly elevated. Medications reviewed; 2000 units cholecalciferol/day, 325 mg ferrous sulfate/day, 1 mg folvite/day, 2 g IV Mg sulfate x1 run 6/9, 8 mg/hr IV protonix/day, 10 mEq IV KCL x4 runs 6/9, 40 mEq Klor-Con x1 dose 6/9, 100 mg thiamine/day, 1000 mcg oral cyanocobalamin/day.     NUTRITION - FOCUSED PHYSICAL EXAM:  Unable to complete at this time.  Diet Order:   Diet Order             Diet NPO time specified  Diet effective midnight                   EDUCATION NEEDS:   No education needs have been identified at this time  Skin:  Skin Assessment: Reviewed RN Assessment  Last BM:  6/7 (type 4 x1)  Height:   Ht Readings from Last 1 Encounters:  05/11/2021 6\' 1"  (1.854 m)    Weight:   Wt Readings from Last 1 Encounters:  05/19/2021 81.6 kg      Estimated Nutritional Needs:  Kcal:  2040-2205 kcal Protein:  105-115 grams Fluid:  >/= 2.2 L/day      Jarome Matin, MS, RD, LDN, CNSC Inpatient Clinical Dietitian RD pager # available in [facility]  After hours/weekend pager # available in [facility]

## 2021-04-29 NOTE — Anesthesia Postprocedure Evaluation (Signed)
Anesthesia Post Note  Patient: Sergio Mack  Procedure(s) Performed: ESOPHAGOGASTRODUODENOSCOPY (EGD) WITH PROPOFOL BIOPSY     Patient location during evaluation: Endoscopy Anesthesia Type: MAC Level of consciousness: awake and alert Pain management: pain level controlled Vital Signs Assessment: post-procedure vital signs reviewed and stable Respiratory status: spontaneous breathing, nonlabored ventilation, respiratory function stable and patient connected to nasal cannula oxygen Cardiovascular status: stable and blood pressure returned to baseline Postop Assessment: no apparent nausea or vomiting Anesthetic complications: no   No notable events documented.  Last Vitals:  Vitals:   05/18/2021 1400 04/30/2021 1437  BP:  138/84  Pulse: 95 (!) 101  Resp: (!) 22 20  Temp: 36.8 C 36.7 C  SpO2: 100% 100%    Last Pain:  Vitals:   04/26/2021 1437  TempSrc: Oral  PainSc:                  March Rummage Novalynn Branaman

## 2021-04-29 NOTE — Progress Notes (Signed)
PROGRESS NOTE    Sergio Mack  MPN:[id number] DOB: 30-Aug-1946 DOA: 05/09/2021 PCP: Binnie Rail, MD   Brief Narrative: HPI per Dr. Flossie Buffy 04/24/2021 Sergio Mack is a 75 y.o. male with medical history significant for HFrEF (EF 65-70% 04/2018), HTN, persistent atrial fibrillation on Eliquis, history of alcoholic withdrawal seizures, and prediabetes who was brought in by his wife for concerns of heavy alcohol use.   Wife is not at bedside at this time.  Patient reports that he started drinking for the past 2 weeks after his son passed away.  He states because of his son's death is still unknown.  He has been drinking up to half a gallon of vodka every 2 days.  Has not had much to eat or drink.  No nausea, vomiting abdominal pain.  Has had diarrhea and noted to be dark.  States he takes iron supplementation.  He reports that he continues to take his medication including Eliquis and his antiepileptic. Last alcoholic drink this morning.   ED Course: He was hypotensive down to 80/52 which improved to systolic of 008 after 3 liters of IV fluid.  Fecal occult blood test was confirmed to be positive by ED physician Dr. Roderic Palau and patient was given Andexxa for concerns of massive GI bleed initially prior to labs returning. Later hemoglobin found to be stable at 11.4.  Platelet of 70.  Sodium 132.  AKI of 2.7 from prior 1.09.  Glucose of 69.  Lactate of 3.3.  AST of 126.  ALT of 91.  Gap of 21.  Alcohol level 211.  Assessment & Plan:   Principal Problem:   Hypotension Active Problems:   HFrEF (heart failure with reduced ejection fraction) (HCC)   Persistent atrial fibrillation (HCC)   Alcoholism (HCC)   Metabolic acidosis   AKI (acute kidney injury) (HCC)   Thrombocytopenia (HCC)   Transaminitis   GI bleed   Melena   Acute blood loss anemia   Elevated LFTs   Gastritis and gastroduodenitis  #1 alcohol abuse-patient started drinking heavily since the death of his son.  He is tachypneic  tachycardic, delirious and hallucinating due to alcohol withdrawal.   Blood pressure improved with IV fluids.   Continue CIWA protocol. His alcohol level was over 200 on admission.  Patient's son was found dead at his home by his grandson.  He apparently died of a heart attack 2 weeks ago.  Patient has been drinking heavily ever since.  They only have 1 child.  #2 hypotension resolved with IV fluids  #3 lactic acidosis also resolved with IV fluids.  Blood culture no growth.  Urine culture with multiple species.  #4 dehydration secondary to decreased p.o. intake and alcohol abuse-on IV fluids  #5 prolonged QTC on Tikosyn Coreg.  Cardiology consulted restarted Coreg.  Mag is 80, K is 3.1 being repleted.Repeat BMP in a.m. with mag.  #6 chronic persistent atrial fibrillation restarting Coreg.Eliquis on hold for concern for GI bleed.  #7gi bleed-fobt positive has black stools.  GI consulted.  EGD 04/30/2021-esophageal mucosal changes consistent with long segment Barrett's esophagus not biopsied, gastritis with erosions biopsied to exclude H. pylori, granular mucosa in the duodenum and biopsied, no evidence of active or recent bleeding.  Gastritis with erosions in the setting of alcohol and Eliquis may explain melena.  Continue Protonix.  Received Andexxa in the ED.  #8 seizures on Keppra  #9 AKI improving secondary to dehydration hypotension hypovolemia- continue fluids  #10 thrombocytopenia likely due  to alcohol abuse   Estimated body mass index is 23.75 kg/m as calculated from the following:   Height as of this encounter: 6\' 1"  (1.854 m).   Weight as of this encounter: 81.6 kg.  DVT prophylaxis: SCD  code Status: Full code Family Communication: Discussed with his wife over the phone his wife is his healthcare POA  disposition Plan:  Status is: Inpatient  Dispo: The patient is from: Home              Anticipated d/c is to: Home              Patient currently is not medically stable to  d/c.   Difficult to place patient No    Consultants: gi  Procedures: None Antimicrobials: None  Subjective: He is sedated resting in bed not able to answer questions due to recently receiving Ativan.  Objective: Vitals:   05/16/2021 1225 04/21/2021 1243 05/19/2021 1400 05/01/2021 1437  BP:  (!) 148/77  138/84  Pulse:  (!) 103 95 (!) 101  Resp: (!) 22 (!) 23 (!) 22 20  Temp:  97.7 F (36.5 C) 98.2 F (36.8 C) 98.1 F (36.7 C)  TempSrc:  Temporal Oral Oral  SpO2:  100% 100% 100%  Weight:      Height:        Intake/Output Summary (Last 24 hours) at 04/28/2021 1457 Last data filed at 05/18/2021 1405 Gross per 24 hour  Intake 1986.38 ml  Output 300 ml  Net 1686.38 ml    Filed Weights   04/25/2021 1609  Weight: 81.6 kg    Examination:  General exam: Appears in mild distress  respiratory system: Clear to auscultation. Respiratory effort normal. Cardiovascular system: S1 & S2 heard, RRR. No JVD, murmurs, rubs, gallops or clicks. No pedal edema. Gastrointestinal system: Abdomen is distended, soft and nontender. No organomegaly or masses felt. Normal bowel sounds heard. Central nervous system: Confused delirious not following commands  extremities: Trace edema. Skin: No rashes, lesions or ulcers Psychiatry: Judgment is impaired  Data Reviewed: I have personally reviewed following labs and imaging studies  CBC: Recent Labs  Lab 05/20/2021 1605 04/28/2021 1701 04/28/21 0500 04/28/21 1619 05/19/2021 0515 05/13/2021 0744  WBC 9.2  --  8.8 7.4 5.4 5.5  NEUTROABS 6.4  --   --   --   --   --   HGB 11.4* 11.6* 9.5* 10.0* 8.4* 8.9*  HCT 35.1* 34.0* 29.9* 29.9* 25.2* 26.4*  MCV 93.1  --  94.9 91.4 91.3 91.3  PLT 70*  --  58* 52* 45* 48*    Basic Metabolic Panel: Recent Labs  Lab 05/19/2021 1605 05/05/2021 1701 04/28/21 0500 04/28/21 1516 05/04/2021 0515  NA 134* 132* 136 137 137  K 3.8 3.9 3.7 3.1* 3.1*  CL 92* 95* 100 114* 103  CO2 15*  --  14* 15* 26  GLUCOSE 70 69* 123* 153* 109*   BUN 34* 31* 28* 17 15  CREATININE 2.17* 2.70* 1.69* 1.03 1.13  CALCIUM 7.6*  --  7.3* 5.6* 8.2*  MG  --   --  2.4  --  1.8    GFR: Estimated Creatinine Clearance: 63.8 mL/min (by C-G formula based on SCr of 1.13 mg/dL). Liver Function Tests: Recent Labs  Lab 05/07/2021 1605 04/28/2021 0515  AST 146* 63*  ALT 91* 57*  ALKPHOS 51 48  BILITOT 1.1 1.5*  PROT 5.7* 5.0*  ALBUMIN 3.3* 2.8*    No results for input(s): LIPASE, AMYLASE in the  last 168 hours. No results for input(s): AMMONIA in the last 168 hours. Coagulation Profile: Recent Labs  Lab 05/15/2021 1605  INR 1.6*    Cardiac Enzymes: No results for input(s): CKTOTAL, CKMB, CKMBINDEX, TROPONINI in the last 168 hours. BNP (last 3 results) No results for input(s): PROBNP in the last 8760 hours. HbA1C: No results for input(s): HGBA1C in the last 72 hours. CBG: No results for input(s): GLUCAP in the last 168 hours. Lipid Profile: No results for input(s): CHOL, HDL, LDLCALC, TRIG, CHOLHDL, LDLDIRECT in the last 72 hours. Thyroid Function Tests: No results for input(s): TSH, T4TOTAL, FREET4, T3FREE, THYROIDAB in the last 72 hours. Anemia Panel: No results for input(s): VITAMINB12, FOLATE, FERRITIN, TIBC, IRON, RETICCTPCT in the last 72 hours. Sepsis Labs: Recent Labs  Lab 05/01/2021 2351 04/28/21 0500 04/28/21 1222 04/28/21 1520  LATICACIDVEN 4.3* 4.0* 1.8 1.9     Recent Results (from the past 240 hour(s))  Resp Panel by RT-PCR (Flu A&B, Covid) Nasopharyngeal Swab     Status: None   Collection Time: 05/04/2021  4:05 PM   Specimen: Nasopharyngeal Swab; Nasopharyngeal(NP) swabs in vial transport medium  Result Value Ref Range Status   SARS Coronavirus 2 by RT PCR NEGATIVE NEGATIVE Final    Comment: (NOTE) SARS-CoV-2 target nucleic acids are NOT DETECTED.  The SARS-CoV-2 RNA is generally detectable in upper respiratory specimens during the acute phase of infection. The lowest concentration of SARS-CoV-2 viral copies  this assay can detect is 138 copies/mL. A negative result does not preclude SARS-Cov-2 infection and should not be used as the sole basis for treatment or other patient management decisions. A negative result may occur with  improper specimen collection/handling, submission of specimen other than nasopharyngeal swab, presence of viral mutation(s) within the areas targeted by this assay, and inadequate number of viral copies(<138 copies/mL). A negative result must be combined with clinical observations, patient history, and epidemiological information. The expected result is Negative.  Fact Sheet for Patients:  EntrepreneurPulse.com.au  Fact Sheet for Healthcare Providers:  IncredibleEmployment.be  This test is no t yet approved or cleared by the [place] FDA and  has been authorized for detection and/or diagnosis of SARS-CoV-2 by FDA under an Emergency Use Authorization (EUA). This EUA will remain  in effect (meaning this test can be used) for the duration of the COVID-19 declaration under Section 564(b)(1) of the Act, 21 U.S.C.section 360bbb-3(b)(1), unless the authorization is terminated  or revoked sooner.       Influenza A by PCR NEGATIVE NEGATIVE Final   Influenza B by PCR NEGATIVE NEGATIVE Final    Comment: (NOTE) The Xpert Xpress SARS-CoV-2/FLU/RSV plus assay is intended as an aid in the diagnosis of influenza from Nasopharyngeal swab specimens and should not be used as a sole basis for treatment. Nasal washings and aspirates are unacceptable for Xpert Xpress SARS-CoV-2/FLU/RSV testing.  Fact Sheet for Patients: EntrepreneurPulse.com.au  Fact Sheet for Healthcare Providers: IncredibleEmployment.be  This test is not yet approved or cleared by the [place] FDA and has been authorized for detection and/or diagnosis of SARS-CoV-2 by FDA under an Emergency Use Authorization (EUA). This EUA  will remain in effect (meaning this test can be used) for the duration of the COVID-19 declaration under Section 564(b)(1) of the Act, 21 U.S.C. section 360bbb-3(b)(1), unless the authorization is terminated or revoked.  Performed at [facility], [place] [address], [place], [place] [address]   Blood Culture (routine x 2)     Status: None (Preliminary  result)   Collection Time: 04/22/2021  4:40 PM   Specimen: BLOOD  Result Value Ref Range Status   Specimen Description   Final    BLOOD LEFT ANTECUBITAL Performed at [place] [address] [place], [place] [address]    Special Requests   Final    BOTTLES DRAWN AEROBIC AND ANAEROBIC Blood Culture adequate volume Performed at [facility] [address] [place], [place] [address]    Culture   Final    NO GROWTH 2 DAYS Performed at [place] [address], [place], [place] [address]    Report Status PENDING  Incomplete  Blood Culture (routine x 2)     Status: None (Preliminary result)   Collection Time: 05/18/2021  4:40 PM   Specimen: BLOOD  Result Value Ref Range Status   Specimen Description   Final    BLOOD RIGHT ANTECUBITAL Performed at [place] [address], [place], [place] [address]    Special Requests   Final    BOTTLES DRAWN AEROBIC AND ANAEROBIC Blood Culture adequate volume Performed at [place] [facility] [address], [place], [place] [address]    Culture   Final    NO GROWTH 2 DAYS Performed at [place] [address], [place], [place] [address]    Report Status PENDING  Incomplete  Urine culture     Status: Abnormal   Collection Time: 05/18/2021  8:30 PM   Specimen: In/Out Cath Urine  Result Value Ref Range Status   Specimen Description   Final    IN/OUT CATH URINE Performed at [place] [address], [place], [place] [address]    Special Requests   Final     NONE Performed at [facility], [place] [address], [place],  [address]    Culture MULTIPLE SPECIES PRESENT, SUGGEST RECOLLECTION (A)  Final   Report Status 05/08/2021 FINAL  Final         Radiology Studies: US Abdomen Complete  Result Date: 05/09/2021 CLINICAL DATA:  Abnormal liver function. Alcohol abuse. Thrombocytopenia. EXAM: ABDOMEN ULTRASOUND COMPLETE COMPARISON:  No recent prior. FINDINGS: Gallbladder: 1.5 cm gallstone noted. Ring down artifact noted suggesting adenomyomatosis. Gallbladder wall is slightly thickened at 4 mm. Cholecystitis cannot be excluded. Negative Murphy sign. Common bile duct: Diameter: 6 mm Liver: Increased echogenicity consistent fatty infiltration or hepatocellular disease. Portal vein is patent on color Doppler imaging with normal direction of blood flow towards the liver. IVC: No abnormality visualized. Pancreas: Hypoechoic foci within the pancreas cannot be excluded. To exclude pancreatic mass MRI should be considered. Spleen: Size and appearance within normal limits. Right Kidney: Length: 11.2 cm. Echogenicity within normal limits. Two tiny simple cysts, the largest measures 0.9 cm. No hydronephrosis visualized. Left Kidney: Length: 12.9 cm. Echogenicity within normal limits. 1.1 cm simple cyst. Moderate hydronephrosis visualized. Bladder is nondistended.  Bilateral ureteral jets noted. Abdominal aorta: Mild ectasia of the abdominal aorta 3.1 cm. Other findings: Mild ascites. This was a technically difficult exam as the patient was uncooperative. IMPRESSION: 1. Technically difficult exam as the patient was uncooperative. A 1.5 cm gallstone is noted. Adenomyomatosis of the gallbladder cannot be excluded. Gallbladder wall is slightly thickened at 4 mm. Cholecystitis cannot be excluded. Negative Murphy sign. No biliary distention. 2. Increased echogenicity of the liver consistent with fatty infiltration or hepatocellular disease. 3. Questionable  hypoechoic foci within the pancreas. To exclude pancreatic mass abdominal MRI  should be considered. 4. Moderate left hydronephrosis. Tiny simple cysts both kidneys. Bladder is nondistended. Bilateral ureteral jets noted. 5. Mild ectasia of the abdominal aorta to 3.1 cm. Recommend follow up by [place] in 3years. This recommendation follows ACR consensus guidelines: White Paper of the ACR Incidental Findings Committee II on Vascular Findings. Joellyn Rued [id number]; [id number]. Electronically Signed   By: Marcello Moores  Register   On: 05/06/2021 08:46   DG Chest Port 1 View  Result Date: 04/28/2021 CLINICAL DATA:  Possible sepsis.  Alcohol abuse.  Hypotension. EXAM: PORTABLE CHEST 1 VIEW COMPARISON:  04/18/2020 FINDINGS: Interval patient rotation to the left. No gross cardiac enlargement. Stable mild pleural and parenchymal scarring at the left lung base. Minimal linear atelectasis or scarring at the right lung base. Thoracic spine degenerative changes. IMPRESSION: No acute abnormality. Electronically Signed   By: Claudie Revering M.D.   On: 05/05/2021 16:28         Scheduled Meds:  carvedilol  3.125 mg Oral BID WC   cholecalciferol  2,000 Units Oral Daily   ferrous sulfate  325 mg Oral Q breakfast   folic acid  1 mg Oral Daily   levETIRAcetam  500 mg Oral BID   multivitamin with minerals  1 tablet Oral Daily   [START ON 04/30/2021] pantoprazole  40 mg Oral QAC breakfast   potassium chloride  40 mEq Oral Once   thiamine  100 mg Oral Daily   Or   thiamine  100 mg Intravenous Daily   vitamin B-12  1,000 mcg Oral Daily   Continuous Infusions:     LOS: 1 day   Georgette Shell, MD  04/30/2021, 2:57 PM

## 2021-04-29 NOTE — Progress Notes (Signed)
PT Cancellation Note  Patient Details Name: Sergio Mack MRN: [id number] DOB: 05/29/1946   Cancelled Treatment:    Reason Eval/Treat Not Completed: Patient at procedure or test/unavailable. Pt at endo procedure, will check back tomorrow for eval.    Talbot Grumbling PT, DPT 04/30/2021, 1:59 PM

## 2021-04-30 ENCOUNTER — Encounter (HOSPITAL_COMMUNITY): Payer: Self-pay | Admitting: Internal Medicine

## 2021-04-30 ENCOUNTER — Inpatient Hospital Stay (HOSPITAL_COMMUNITY): Payer: Medicare Other

## 2021-04-30 DIAGNOSIS — R0609 Other forms of dyspnea: Secondary | ICD-10-CM

## 2021-04-30 LAB — ECHOCARDIOGRAM COMPLETE
Height: 73 in
Weight: 2880 oz

## 2021-04-30 LAB — BASIC METABOLIC PANEL
Anion gap: 10 (ref 5–15)
Anion gap: 9 (ref 5–15)
BUN: 7 mg/dL — ABNORMAL LOW (ref 8–23)
BUN: 6 mg/dL — ABNORMAL LOW (ref 8–23)
CO2: 28 mmol/L (ref 22–32)
CO2: 29 mmol/L (ref 22–32)
Calcium: 8.1 mg/dL — ABNORMAL LOW (ref 8.9–10.3)
Calcium: 8.2 mg/dL — ABNORMAL LOW (ref 8.9–10.3)
Chloride: 102 mmol/L (ref 98–111)
Chloride: 102 mmol/L (ref 98–111)
Creatinine, Ser: 0.71 mg/dL (ref 0.61–1.24)
Creatinine, Ser: 0.84 mg/dL (ref 0.61–1.24)
GFR, Estimated: >60 mL/min (ref >60)
GFR, Estimated: >60 mL/min (ref >60)
Glucose, Bld: 134 mg/dL — ABNORMAL HIGH (ref 70–99)
Glucose, Bld: 120 mg/dL — ABNORMAL HIGH (ref 70–99)
Potassium: 3.7 mmol/L (ref 3.5–5.1)
Potassium: 4 mmol/L (ref 3.5–5.1)
Sodium: 140 mmol/L (ref 135–145)
Sodium: 140 mmol/L (ref 135–145)

## 2021-04-30 LAB — COMPREHENSIVE METABOLIC PANEL
ALT: 50 U/L — ABNORMAL HIGH (ref 0–44)
AST: 61 U/L — ABNORMAL HIGH (ref 15–41)
Albumin: 2.7 g/dL — ABNORMAL LOW (ref 3.5–5.0)
Alkaline Phosphatase: 45 U/L (ref 38–126)
Anion gap: 8 (ref 5–15)
BUN: 7 mg/dL — ABNORMAL LOW (ref 8–23)
CO2: 27 mmol/L (ref 22–32)
Calcium: 8 mg/dL — ABNORMAL LOW (ref 8.9–10.3)
Chloride: 103 mmol/L (ref 98–111)
Creatinine, Ser: 0.71 mg/dL (ref 0.61–1.24)
GFR, Estimated: >60 mL/min (ref >60)
Glucose, Bld: 104 mg/dL — ABNORMAL HIGH (ref 70–99)
Potassium: 3.3 mmol/L — ABNORMAL LOW (ref 3.5–5.1)
Sodium: 138 mmol/L (ref 135–145)
Total Bilirubin: 1.4 mg/dL — ABNORMAL HIGH (ref 0.3–1.2)
Total Protein: 4.8 g/dL — ABNORMAL LOW (ref 6.5–8.1)

## 2021-04-30 LAB — MAGNESIUM
Magnesium: 1.6 mg/dL — ABNORMAL LOW (ref 1.7–2.4)
Magnesium: 2.6 mg/dL — ABNORMAL HIGH (ref 1.7–2.4)

## 2021-04-30 LAB — SURGICAL PATHOLOGY

## 2021-04-30 MED ORDER — METOPROLOL TARTRATE 5 MG/5ML IV SOLN
5.0000 mg | Freq: Once | INTRAVENOUS | Status: AC
  Administered 2021-04-30: 5 mg via INTRAVENOUS
  Filled 2021-04-30: qty 5

## 2021-04-30 MED ORDER — METOPROLOL TARTRATE 5 MG/5ML IV SOLN
5.0000 mg | INTRAVENOUS | Status: AC
  Administered 2021-04-30: 5 mg via INTRAVENOUS

## 2021-04-30 MED ORDER — POTASSIUM CHLORIDE CRYS ER 20 MEQ PO TBCR
40.0000 meq | EXTENDED_RELEASE_TABLET | ORAL | Status: AC
  Administered 2021-04-30 (×2): 40 meq via ORAL
  Filled 2021-04-30 (×2): qty 2

## 2021-04-30 MED ORDER — POTASSIUM CHLORIDE CRYS ER 20 MEQ PO TBCR: 20.0000 meq | EXTENDED_RELEASE_TABLET | Freq: Two times a day (BID) | ORAL | Status: DC

## 2021-04-30 MED ORDER — CARVEDILOL 12.5 MG PO TABS
12.5000 mg | ORAL_TABLET | Freq: Two times a day (BID) | ORAL | Status: DC
  Administered 2021-04-30: 12.5 mg via ORAL
  Filled 2021-04-30: qty 1

## 2021-04-30 MED ORDER — POTASSIUM CHLORIDE CRYS ER 20 MEQ PO TBCR
40.0000 meq | EXTENDED_RELEASE_TABLET | Freq: Two times a day (BID) | ORAL | Status: DC
  Administered 2021-04-30 – 2021-05-02 (×4): 40 meq via ORAL
  Filled 2021-04-30 (×6): qty 2

## 2021-04-30 MED ORDER — CARVEDILOL 12.5 MG PO TABS: 12.5000 mg | ORAL_TABLET | Freq: Once | ORAL | Status: DC

## 2021-04-30 MED ORDER — METOPROLOL TARTRATE 5 MG/5ML IV SOLN
INTRAVENOUS | Status: AC
  Filled 2021-04-30: qty 5

## 2021-04-30 MED ORDER — MAGNESIUM SULFATE 4 GM/100ML IV SOLN
4.0000 g | Freq: Once | INTRAVENOUS | Status: AC
  Administered 2021-04-30: 4 g via INTRAVENOUS
  Filled 2021-04-30: qty 100

## 2021-04-30 MED ORDER — CARVEDILOL 6.25 MG PO TABS
6.2500 mg | ORAL_TABLET | Freq: Once | ORAL | Status: AC
  Administered 2021-04-30: 6.25 mg via ORAL
  Filled 2021-04-30: qty 1

## 2021-04-30 NOTE — Progress Notes (Signed)
PROGRESS NOTE    Sergio Mack  DGL:[id number] DOB: 1946/01/24 DOA: 05/08/2021 PCP: Sergio Rail, MD   Brief Narrative: HPI per Dr. Flossie Buffy 05/07/2021 Sergio Mack is a 75 y.o. male with medical history significant for HFrEF (EF 65-70% 04/2018), HTN, persistent atrial fibrillation on Eliquis, history of alcoholic withdrawal seizures, and prediabetes who was brought in by his wife for concerns of heavy alcohol use.   Wife is not at bedside at this time.  Patient reports that he started drinking for the past 2 weeks after his son passed away.  He states because of his son's death is still unknown.  He has been drinking up to half a gallon of vodka every 2 days.  Has not had much to eat or drink.  No nausea, vomiting abdominal pain.  Has had diarrhea and noted to be dark.  States he takes iron supplementation.  He reports that he continues to take his medication including Eliquis and his antiepileptic. Last alcoholic drink this morning.   ED Course: He was hypotensive down to 80/52 which improved to systolic of 518 after 3 liters of IV fluid.  Fecal occult blood test was confirmed to be positive by ED physician Dr. Roderic Palau and patient was given Andexxa for concerns of massive GI bleed initially prior to labs returning. Later hemoglobin found to be stable at 11.4.  Platelet of 70.  Sodium 132.  AKI of 2.7 from prior 1.09.  Glucose of 69.  Lactate of 3.3.  AST of 126.  ALT of 91.  Gap of 21.  Alcohol level 211.  Assessment & Plan:   Principal Problem:   Hypotension Active Problems:   HFrEF (heart failure with reduced ejection fraction) (HCC)   Persistent atrial fibrillation (HCC)   Alcoholism (HCC)   Metabolic acidosis   AKI (acute kidney injury) (HCC)   Thrombocytopenia (HCC)   Transaminitis   GI bleed   Melena   Acute blood loss anemia   Elevated LFTs   Gastritis and gastroduodenitis  #1 alcohol abuse-patient started drinking heavily since the death of his son.  He is tachypneic  tachycardic, delirious and hallucinating due to alcohol withdrawal.   Blood pressure improved with IV fluids.   Continue CIWA protocol. His alcohol level was over 200 on admission.  Patient's son was found dead at his home by his grandson.  He apparently died of a heart attack 2 weeks ago.  Patient has been drinking heavily ever since.  They only have 1 child.  #2 hypotension resolved with IV fluids  #3 lactic acidosis also resolved with IV fluids.  Blood culture no growth.  Urine culture with multiple species.  #4 dehydration secondary to decreased p.o. intake and alcohol abuse-on IV fluids  #5 prolonged QTC on Tikosyn Coreg.  Cardiology consulted restarted Coreg.  Mag is 2.6  K is 4.0   #6 chronic persistent atrial fibrillation with RVR patient received an extra dose of Coreg this morning 6.25 mg.  Increase the dose of Coreg to 12.5 twice a day..Eliquis on hold for concern for GI bleed. Candidate to restart Tikosyn at this time with no anticoagulation on board.  #7gi bleed-fobt positive has black stools.  GI consulted.  EGD 05/01/2021-esophageal mucosal changes consistent with long segment Barrett's esophagus not biopsied, gastritis with erosions biopsied to exclude H. pylori, granular mucosa in the duodenum and biopsied, no evidence of active or recent bleeding.  Gastritis with erosions in the setting of alcohol and Eliquis may explain melena.  Continue Protonix.  Received Andexxa in the ED.  #8 seizures on Keppra  #9 AKI improving secondary to dehydration hypotension hypovolemia- continue fluids  #10 thrombocytopenia likely due to alcohol abuse   Estimated body mass index is 23.75 kg/m as calculated from the following:   Height as of this encounter: 6\' 1"  (1.854 m).   Weight as of this encounter: 81.6 kg.  DVT prophylaxis: SCD  code Status: Full code Family Communication: Discussed with his wife over the phone his wife is his healthcare POA  disposition Plan:  Status is:  Inpatient  Dispo: The patient is from: Home              Anticipated d/c is to: Home              Patient currently is not medically stable to d/c.   Difficult to place patient No    Consultants: gi  Procedures: None Antimicrobials: None  Subjective: He is resting in bed he is awake and was able to tell me where he is he denies any chest pain shortness of breath nausea vomiting abdominal pain or diarrhea. He had episode of A. fib RVR this morning received  Lopressor/Coreg Seen by cardiology and EP not restarting Tikosyn at this time Objective: Vitals:   04/30/21 0805 04/30/21 0900 04/30/21 1028 04/30/21 1228  BP: 120/73  100/62 (!) 130/114  Pulse: (!) 124 (!) 114 (!) 118 (!) 122  Resp: (!) 21  18 18   Temp: 99 F (37.2 C)  98.8 F (37.1 C) 98.1 F (36.7 C)  TempSrc: Axillary  Axillary Axillary  SpO2:      Weight:      Height:        Intake/Output Summary (Last 24 hours) at 04/30/2021 1435 Last data filed at 04/30/2021 1100 Gross per 24 hour  Intake 92.87 ml  Output 1950 ml  Net -1857.13 ml    Filed Weights   05/09/2021 1609  Weight: 81.6 kg    Examination:  General exam: Appears in mild distress  respiratory system: Clear to auscultation. Respiratory effort normal. Cardiovascular system: S1 & S2 heard, RRR. No JVD, murmurs, rubs, gallops or clicks. No pedal edema. Gastrointestinal system: Abdomen is distended, soft and nontender. No organomegaly or masses felt. Normal bowel sounds heard. Central nervous system: Confused delirious not following commands  extremities: Trace edema. Skin: No rashes, lesions or ulcers Psychiatry: Judgment is impaired  Data Reviewed: I have personally reviewed following labs and imaging studies  CBC: Recent Labs  Lab 05/04/2021 1605 04/24/2021 1701 04/28/21 0500 04/28/21 1619 05/10/2021 0515 04/25/2021 0744 05/17/2021 1621  WBC 9.2  --  8.8 7.4 5.4 5.5 4.6  NEUTROABS 6.4  --   --   --   --   --   --   HGB 11.4*   < > 9.5* 10.0* 8.4*  8.9* 9.1*  HCT 35.1*   < > 29.9* 29.9* 25.2* 26.4* 27.2*  MCV 93.1  --  94.9 91.4 91.3 91.3 91.3  PLT 70*  --  58* 52* 45* 48* 52*   < > = values in this interval not displayed.    Basic Metabolic Panel: Recent Labs  Lab 04/28/21 0500 04/28/21 1516 05/14/2021 0515 04/30/21 0516 04/30/21 1102 04/30/21 1300  NA 136 137 137 138 140 140  K 3.7 3.1* 3.1* 3.3* 3.7 4.0  CL 100 114* 103 103 102 102  CO2 14* 15* 26 27 28 29   GLUCOSE 123* 153* 109* 104* 134* 120*  BUN  28* 17 15 7* 7* 6*  CREATININE 1.69* 1.03 1.13 0.71 0.71 0.84  CALCIUM 7.3* 5.6* 8.2* 8.0* 8.1* 8.2*  MG 2.4  --  1.8 1.6*  --  2.6*    GFR: Estimated Creatinine Clearance: 85.9 mL/min (by C-G formula based on SCr of 0.84 mg/dL). Liver Function Tests: Recent Labs  Lab 05/20/2021 1605 04/30/2021 0515 04/30/21 0516  AST 146* 63* 61*  ALT 91* 57* 50*  ALKPHOS 51 48 45  BILITOT 1.1 1.5* 1.4*  PROT 5.7* 5.0* 4.8*  ALBUMIN 3.3* 2.8* 2.7*    No results for input(s): LIPASE, AMYLASE in the last 168 hours. No results for input(s): AMMONIA in the last 168 hours. Coagulation Profile: Recent Labs  Lab 05/10/2021 1605  INR 1.6*    Cardiac Enzymes: No results for input(s): CKTOTAL, CKMB, CKMBINDEX, TROPONINI in the last 168 hours. BNP (last 3 results) No results for input(s): PROBNP in the last 8760 hours. HbA1C: No results for input(s): HGBA1C in the last 72 hours. CBG: No results for input(s): GLUCAP in the last 168 hours. Lipid Profile: No results for input(s): CHOL, HDL, LDLCALC, TRIG, CHOLHDL, LDLDIRECT in the last 72 hours. Thyroid Function Tests: No results for input(s): TSH, T4TOTAL, FREET4, T3FREE, THYROIDAB in the last 72 hours. Anemia Panel: No results for input(s): VITAMINB12, FOLATE, FERRITIN, TIBC, IRON, RETICCTPCT in the last 72 hours. Sepsis Labs: Recent Labs  Lab 05/05/2021 2351 04/28/21 0500 04/28/21 1222 04/28/21 1520  LATICACIDVEN 4.3* 4.0* 1.8 1.9     Recent Results (from the past 240  hour(s))  Resp Panel by RT-PCR (Flu A&B, Covid) Nasopharyngeal Swab     Status: None   Collection Time: 04/21/2021  4:05 PM   Specimen: Nasopharyngeal Swab; Nasopharyngeal(NP) swabs in vial transport medium  Result Value Ref Range Status   SARS Coronavirus 2 by RT PCR NEGATIVE NEGATIVE Final    Comment: (NOTE) SARS-CoV-2 target nucleic acids are NOT DETECTED.  The SARS-CoV-2 RNA is generally detectable in upper respiratory specimens during the acute phase of infection. The lowest concentration of SARS-CoV-2 viral copies this assay can detect is 138 copies/mL. A negative result does not preclude SARS-Cov-2 infection and should not be used as the sole basis for treatment or other patient management decisions. A negative result may occur with  improper specimen collection/handling, submission of specimen other than nasopharyngeal swab, presence of viral mutation(s) within the areas targeted by this assay, and inadequate number of viral copies(<138 copies/mL). A negative result must be combined with clinical observations, patient history, and epidemiological information. The expected result is Negative.  Fact Sheet for Patients:  EntrepreneurPulse.com.au  Fact Sheet for Healthcare Providers:  IncredibleEmployment.be  This test is no t yet approved or cleared by the [place] FDA and  has been authorized for detection and/or diagnosis of SARS-CoV-2 by FDA under an Emergency Use Authorization (EUA). This EUA will remain  in effect (meaning this test can be used) for the duration of the COVID-19 declaration under Section 564(b)(1) of the Act, 21 U.S.C.section 360bbb-3(b)(1), unless the authorization is terminated  or revoked sooner.       Influenza A by PCR NEGATIVE NEGATIVE Final   Influenza B by PCR NEGATIVE NEGATIVE Final    Comment: (NOTE) The Xpert Xpress SARS-CoV-2/FLU/RSV plus assay is intended as an aid in the diagnosis of influenza from  Nasopharyngeal swab specimens and should not be used as a sole basis for treatment. Nasal washings and aspirates are unacceptable for Xpert Xpress SARS-CoV-2/FLU/RSV testing.  Fact Sheet  for Patients: EntrepreneurPulse.com.au  Fact Sheet for Healthcare Providers: IncredibleEmployment.be  This test is not yet approved or cleared by the [place] FDA and has been authorized for detection and/or diagnosis of SARS-CoV-2 by FDA under an Emergency Use Authorization (EUA). This EUA will remain in effect (meaning this test can be used) for the duration of the COVID-19 declaration under Section 564(b)(1) of the Act, 21 U.S.C. section 360bbb-3(b)(1), unless the authorization is terminated or revoked.  Performed at [facility], [place] [address], [place], [place] [address]   Blood Culture (routine x 2)     Status: None (Preliminary result)   Collection Time: 04/24/2021  4:40 PM   Specimen: BLOOD  Result Value Ref Range Status   Specimen Description   Final    BLOOD LEFT ANTECUBITAL Performed at [facility] [address], [place], [place] [address]    Special Requests   Final    BOTTLES DRAWN AEROBIC AND ANAEROBIC Blood Culture adequate volume Performed at [facility] [address] [place], [place] [address]    Culture   Final    NO GROWTH 3 DAYS Performed at [facility], [place] [address], [place], [place] [address]    Report Status PENDING  Incomplete  Blood Culture (routine x 2)     Status: None (Preliminary result)   Collection Time: 05/04/2021  4:40 PM   Specimen: BLOOD  Result Value Ref Range Status   Specimen Description   Final    BLOOD RIGHT ANTECUBITAL Performed at [facility] [address], [place], [place] [address]    Special Requests   Final    BOTTLES DRAWN AEROBIC AND ANAEROBIC Blood Culture adequate volume Performed at [facility] [address] [place], [place] [address]    Culture   Final    NO GROWTH 3 DAYS Performed at [facility], [place] [address], [place], [place] [address]    Report Status PENDING  Incomplete  Urine culture     Status: Abnormal   Collection Time: 05/13/2021  8:30 PM   Specimen: In/Out Cath Urine  Result Value Ref Range Status   Specimen Description   Final    IN/OUT CATH URINE Performed at [facility] [address] [place], [place] [address]    Special Requests   Final    NONE Performed at [facility], [place] [address], [place],  [address]    Culture MULTIPLE SPECIES PRESENT, SUGGEST RECOLLECTION (A)  Final   Report Status 05/13/2021 FINAL  Final         Radiology Studies: US Abdomen Complete  Result Date: 05/17/2021 CLINICAL DATA:  Abnormal liver function. Alcohol abuse. Thrombocytopenia. EXAM: ABDOMEN ULTRASOUND COMPLETE COMPARISON:  No recent prior. FINDINGS: Gallbladder: 1.5 cm gallstone noted. Ring down artifact noted suggesting adenomyomatosis. Gallbladder wall is slightly thickened at 4 mm. Cholecystitis cannot be excluded. Negative Murphy sign. Common bile duct: Diameter: 6 mm Liver: Increased echogenicity consistent fatty infiltration or hepatocellular disease. Portal vein is patent on color Doppler imaging with normal direction of blood flow towards the liver. IVC: No abnormality visualized. Pancreas: Hypoechoic foci within the pancreas cannot be excluded. To exclude pancreatic mass MRI should be considered. Spleen: Size and appearance within normal limits. Right Kidney: Length: 11.2 cm. Echogenicity within normal limits. Two tiny simple cysts, the largest measures 0.9 cm. No hydronephrosis visualized. Left Kidney: Length: 12.9 cm. Echogenicity within normal limits. 1.1 cm  simple cyst. Moderate hydronephrosis visualized. Bladder is nondistended.  Bilateral ureteral jets noted. Abdominal aorta: Mild ectasia of the  abdominal aorta 3.1 cm. Other findings: Mild ascites. This was a technically difficult exam as the patient was uncooperative. IMPRESSION: 1. Technically difficult exam as the patient was uncooperative. A 1.5 cm gallstone is noted. Adenomyomatosis of the gallbladder cannot be excluded. Gallbladder wall is slightly thickened at 4 mm. Cholecystitis cannot be excluded. Negative Murphy sign. No biliary distention. 2. Increased echogenicity of the liver consistent with fatty infiltration or hepatocellular disease. 3. Questionable hypoechoic foci within the pancreas. To exclude pancreatic mass abdominal MRI should be considered. 4. Moderate left hydronephrosis. Tiny simple cysts both kidneys. Bladder is nondistended. Bilateral ureteral jets noted. 5. Mild ectasia of the abdominal aorta to 3.1 cm. Recommend follow up by [place] in 3years. This recommendation follows ACR consensus guidelines: White Paper of the ACR Incidental Findings Committee II on Vascular Findings. Joellyn Rued [id number]; [id number]. Electronically Signed   By: Marcello Moores  Register   On: 05/16/2021 08:46         Scheduled Meds:  carvedilol  3.125 mg Oral BID WC   cholecalciferol  2,000 Units Oral Daily   ferrous sulfate  325 mg Oral Q breakfast   folic acid  1 mg Oral Daily   levETIRAcetam  500 mg Oral BID   multivitamin with minerals  1 tablet Oral Daily   pantoprazole  40 mg Oral QAC breakfast   potassium chloride  40 mEq Oral Once   potassium chloride  40 mEq Oral BID   thiamine  100 mg Oral Daily   Or   thiamine  100 mg Intravenous Daily   vitamin B-12  1,000 mcg Oral Daily   Continuous Infusions:     LOS: 2 days   Georgette Shell, MD  04/30/2021, 2:35 PM

## 2021-04-30 NOTE — Progress Notes (Addendum)
EP was asked to weigh in or comment on resuming Tikosyn for this patient.  Outpatient he hs been on Tikosyn since 2019 with borderline QTc Admitted to [facility] 05/06/2021 via his wife for 2 weeks of marked ETOH abuse/use  (ETOH level 211 on admission) and reports of melena and concerns of GIB  He received andexxa on admission and has not been on a/c since admission He arrve in SR with markedly long QTc of 53ms in setting of AKI/Creat 2.19 (clearance 34) on 532mcg of Tikosyn at home.  He has subsequently reverted back to AF and EP is asked to weigh on decision for Tikosyn.  Can not resume Tikosyn at this time 2/2 being off a/c. Unclear in AFib though QT I suspect may be a little long still. He will need to be restarted on [facility] when able and then either wait 3 weeks or TEE 1st Right now rate control is the only option  Sad story with recovering alcoholic for many years relapsed after death of his son leading to this hospitalization I would have concerns about resuming Tikosyn in this environment and either have out pt EP follow up or consider alternative AAD when able  I will review with Dr. Hettie Holstein, PA-C  EP Attending  Discussed with Mrs. Charlcie Cradle, PA-C. The patient should not be placed back on dofetilide as he is not a candidate currently for anti-coagulation and probably will need to prove abstinence of ETOH and no additional GI bleeding before restarting an [place] and dofetilide.  Carleene Overlie Nickie Deren,MD

## 2021-04-30 NOTE — Progress Notes (Signed)
PT Cancellation Note  Patient Details Name: Sergio Mack MRN: [id number] DOB: 29-Mar-1946   Cancelled Treatment:    Reason Eval/Treat Not Completed: Patient not medically ready; pt with incr HR earlier this am, checked x2 and pt sedated d/t meds. Will continue efforts to complete PT eval;   [facility] 04/30/2021, 3:06 PM

## 2021-04-30 NOTE — Progress Notes (Signed)
Shift summary 7am - now.   Upon getting report from the previous night nurse the patient had a very high HR in the 150s -170s. Gave him Ativan per the CIWA scale and his ordered 3.125mg  coreg. HR still remained high. Informed Dr. Rodena Piety, she came to see the patient gave a 1x order for lopressor 5mg . That was given but HR still remained 130s-150.   Patient was given second dose of ativan per the CIWA scale, HR still elevated. New order for higher dose of carvedilol put in by Dr. Rodena Piety who also sent message to cardiology about his Tikosyn. She states they said they will decide when they come to see the patient.  For the last couple hours HR has maintained 115-130s. Just informed Dr. Rodena Piety that HR has not really changed even with another dose of Ativan from the CIWA scale and that increased dose of coreg on board.

## 2021-04-30 NOTE — Progress Notes (Signed)
Progress Note  Patient Name: Sergio Mack Date of Encounter: 04/30/2021  [facility] Cardiologist: Thompson Grayer, MD   Subjective   Lethargic   Inpatient Medications    Scheduled Meds:  carvedilol  3.125 mg Oral BID WC   cholecalciferol  2,000 Units Oral Daily   ferrous sulfate  325 mg Oral Q breakfast   folic acid  1 mg Oral Daily   levETIRAcetam  500 mg Oral BID   multivitamin with minerals  1 tablet Oral Daily   pantoprazole  40 mg Oral QAC breakfast   potassium chloride  40 mEq Oral Once   potassium chloride  40 mEq Oral BID   thiamine  100 mg Oral Daily   Or   thiamine  100 mg Intravenous Daily   vitamin B-12  1,000 mcg Oral Daily   Continuous Infusions:  magnesium sulfate bolus IVPB 4 g (04/30/21 0833)   PRN Meds: LORazepam **OR** LORazepam   Vital Signs    Vitals:   04/30/21 0600 04/30/21 0700 04/30/21 0805 04/30/21 0900  BP: (!) 100/59 109/85 120/73   Pulse: 92 (!) 144 (!) 124 (!) 114  Resp:   (!) 21   Temp:   99 F (37.2 C)   TempSrc:   Axillary   SpO2:      Weight:      Height:        Intake/Output Summary (Last 24 hours) at 04/30/2021 1029 Last data filed at 04/30/2021 0803 Gross per 24 hour  Intake 300 ml  Output 2250 ml  Net -1950 ml   Last 3 Weights 05/09/2021 02/03/2021 08/06/2020  Weight (lbs) 180 lb 192 lb 3.2 oz 174 lb 12.8 oz  Weight (kg) 81.647 kg 87.181 kg 79.289 kg      Telemetry    Afib rates 110  ECG    No new - Personally Reviewed  Physical Exam   GEN: No acute distress.   Neck: No JVD Cardiac: RRR, no murmurs, rubs, or gallops.  Respiratory: Clear to auscultation bilaterally. GI: Soft, nontender, non-distended  MS: No edema; No deformity. Neuro:  Nonfocal sedated Psych: Normal affect  SKIN:  + bruising on arms   Labs    High Sensitivity Troponin:  No results for input(s): TROPONINIHS in the last 720 hours.    Chemistry Recent Labs  Lab 05/18/2021 1605 05/08/2021 1701 04/28/21 1516 04/24/2021 0515  04/30/21 0516  NA 134*   < > 137 137 138  K 3.8   < > 3.1* 3.1* 3.3*  CL 92*   < > 114* 103 103  CO2 15*   < > 15* 26 27  GLUCOSE 70   < > 153* 109* 104*  BUN 34*   < > 17 15 7*  CREATININE 2.17*   < > 1.03 1.13 0.71  CALCIUM 7.6*   < > 5.6* 8.2* 8.0*  PROT 5.7*  --   --  5.0* 4.8*  ALBUMIN 3.3*  --   --  2.8* 2.7*  AST 146*  --   --  63* 61*  ALT 91*  --   --  57* 50*  ALKPHOS 51  --   --  48 45  BILITOT 1.1  --   --  1.5* 1.4*  GFRNONAA 31*   < > >60 >60 >60  ANIONGAP 27*   < > 8 8 8    < > = values in this interval not displayed.     Hematology Recent Labs  Lab 05/05/2021 [phone]  05/12/2021 0744 04/23/2021 1621  WBC 5.4 5.5 4.6  RBC 2.76* 2.89* 2.98*  HGB 8.4* 8.9* 9.1*  HCT 25.2* 26.4* 27.2*  MCV 91.3 91.3 91.3  MCH 30.4 30.8 30.5  MCHC 33.3 33.7 33.5  RDW 15.9* 15.6* 15.8*  PLT 45* 48* 52*    BNPNo results for input(s): BNP, PROBNP in the last 168 hours.   DDimer No results for input(s): DDIMER in the last 168 hours.   Radiology    US Abdomen Complete  Result Date: 05/07/2021 CLINICAL DATA:  Abnormal liver function. Alcohol abuse. Thrombocytopenia. EXAM: ABDOMEN ULTRASOUND COMPLETE COMPARISON:  No recent prior. FINDINGS: Gallbladder: 1.5 cm gallstone noted. Ring down artifact noted suggesting adenomyomatosis. Gallbladder wall is slightly thickened at 4 mm. Cholecystitis cannot be excluded. Negative Murphy sign. Common bile duct: Diameter: 6 mm Liver: Increased echogenicity consistent fatty infiltration or hepatocellular disease. Portal vein is patent on color Doppler imaging with normal direction of blood flow towards the liver. IVC: No abnormality visualized. Pancreas: Hypoechoic foci within the pancreas cannot be excluded. To exclude pancreatic mass MRI should be considered. Spleen: Size and appearance within normal limits. Right Kidney: Length: 11.2 cm. Echogenicity within normal limits. Two tiny simple cysts, the largest measures 0.9 cm. No hydronephrosis visualized. Left  Kidney: Length: 12.9 cm. Echogenicity within normal limits. 1.1 cm simple cyst. Moderate hydronephrosis visualized. Bladder is nondistended.  Bilateral ureteral jets noted. Abdominal aorta: Mild ectasia of the abdominal aorta 3.1 cm. Other findings: Mild ascites. This was a technically difficult exam as the patient was uncooperative. IMPRESSION: 1. Technically difficult exam as the patient was uncooperative. A 1.5 cm gallstone is noted. Adenomyomatosis of the gallbladder cannot be excluded. Gallbladder wall is slightly thickened at 4 mm. Cholecystitis cannot be excluded. Negative Murphy sign. No biliary distention. 2. Increased echogenicity of the liver consistent with fatty infiltration or hepatocellular disease. 3. Questionable hypoechoic foci within the pancreas. To exclude pancreatic mass abdominal MRI should be considered. 4. Moderate left hydronephrosis. Tiny simple cysts both kidneys. Bladder is nondistended. Bilateral ureteral jets noted. 5. Mild ectasia of the abdominal aorta to 3.1 cm. Recommend follow up by [place] in 3years. This recommendation follows ACR consensus guidelines: White Paper of the ACR Incidental Findings Committee II on Vascular Findings. Joellyn Rued [id number]; [id number]. Electronically Signed   By: Marcello Moores  Register   On: 04/28/2021 08:46    Cardiac Studies   Echo scheduled for today  Patient Profile     75 y.o. male with a hx of PAF, RBBB, HTN, HLD, prediabetes and a history of alcohol withdrawal and now due to depression of son's death, has been drinking more and not eating.  Admitted with heme + stools, hypotension responded to fluids, and prolonged QTC.Marland Kitchen pt was on Eliquis and given andexxa to reverse..   Assessment & Plan   Severe dehydration secondary to alcohol abuse and poor oral intake             -Related to significant alcohol abuse and poor oral intake after the loss of his son last month.  His wife has been giving him all of his blood pressure medications despite  overall poor oral intake.  This likely exacerbated his hypotension.             -BP improved after hydration.  All home blood pressure medications has been stopped during this admission.   --hypokalemia continued at 3.1 Mg+ 1.8 - K+ >4.0 and Mg+ >2.0 to resume tikosyn.     Alcohol abuse             -  Started in early May after learning his son's death.  Patient is the mourning             -Wife says he likely drank 5 and half gallons of vodka since early May.             -CIWA protocol.  Consider repeat echocardiogram   Tachycardia with prolonged QTC:             -telemetry with ? Afib now check ECG supplement K if ECG shows QT less than 500 msec can restart home dose of Tikosyn   Persistent atrial fibrillation: Previously underwent TEE DCCV in January 2019 and started on Tikosyn at that time.  See above    Hypertension: Hypotensive on arrival due to poor oral intake and alcohol abuse   Hyperlipidemia: Statin held in light of elevated liver enzyme secondary to alcohol abuse   Prediabetes   Right bundle branch block chronic   Acute GI bleed: Hct 27.2 PLT 52   Hypokalemia replacing,  Mg 1.6 replacing K         For questions or updates, please contact [facility] Please consult [email or web address] for contact info under        Signed, Jenkins Rouge, MD  04/30/2021, 10:29 AM     Patient examined chart reviewed. Lethargic with bruising left elbow area and legs. Lungs clear no murmur Echo today. Telemetry with NSR improved QT I started his coreg back yesterday will likely restart Tikosyn in next 48 hours as electrolytes improve Continue to hold anticoagulation   Jenkins Rouge MD FACCPatient ID: Drema Dallas, male   DOB: Oct 12, 1946, 75 y.o.   MRN: [id number]

## 2021-04-30 NOTE — Progress Notes (Signed)
Qtc is still prolonged.  Discussed with Dr. Johnsie Cancel.  Will ask EP to weigh in with recommendations.  Pt not on anticoagulation and he could convert with tikosyn.  His Qtc was prolonged on admit with ETOH and not eating concern that tikosyn is no longer correct drug for pt.

## 2021-04-30 NOTE — Care Management Important Message (Signed)
Important Message  Patient Details IM Letter given to the Patient. Name: Sergio Mack MRN: [id number] Date of Birth: Aug 03, 1946   Medicare Important Message Given:  Yes     Kerin Salen 04/30/2021, 11:56 AM

## 2021-04-30 NOTE — Progress Notes (Signed)
  Echocardiogram 2D Echocardiogram has been performed.  Randa Lynn Rem[place] Teters 04/30/2021, 3:39 PM

## 2021-05-01 LAB — COMPREHENSIVE METABOLIC PANEL
ALT: 51 U/L — ABNORMAL HIGH (ref 0–44)
AST: 58 U/L — ABNORMAL HIGH (ref 15–41)
Albumin: 3 g/dL — ABNORMAL LOW (ref 3.5–5.0)
Alkaline Phosphatase: 52 U/L (ref 38–126)
Anion gap: 9 (ref 5–15)
BUN: 7 mg/dL — ABNORMAL LOW (ref 8–23)
CO2: 28 mmol/L (ref 22–32)
Calcium: 8.4 mg/dL — ABNORMAL LOW (ref 8.9–10.3)
Chloride: 104 mmol/L (ref 98–111)
Creatinine, Ser: 0.75 mg/dL (ref 0.61–1.24)
GFR, Estimated: >60 mL/min (ref >60)
Glucose, Bld: 108 mg/dL — ABNORMAL HIGH (ref 70–99)
Potassium: 4.4 mmol/L (ref 3.5–5.1)
Sodium: 141 mmol/L (ref 135–145)
Total Bilirubin: 1.3 mg/dL — ABNORMAL HIGH (ref 0.3–1.2)
Total Protein: 5.5 g/dL — ABNORMAL LOW (ref 6.5–8.1)

## 2021-05-01 LAB — CBC
HCT: 29.3 % — ABNORMAL LOW (ref 39.0–52.0)
Hemoglobin: 9.7 g/dL — ABNORMAL LOW (ref 13.0–17.0)
MCH: 30.7 pg (ref 26.0–34.0)
MCHC: 33.1 g/dL (ref 30.0–36.0)
MCV: 92.7 fL (ref 80.0–100.0)
Platelets: 92 10*3/uL — ABNORMAL LOW (ref 150–400)
RBC: 3.16 MIL/uL — ABNORMAL LOW (ref 4.22–5.81)
RDW: 15.7 % — ABNORMAL HIGH (ref 11.5–15.5)
WBC: 5.5 10*3/uL (ref 4.0–10.5)
nRBC: 0 % (ref 0.0–0.2)

## 2021-05-01 LAB — MAGNESIUM: Magnesium: 2.1 mg/dL (ref 1.7–2.4)

## 2021-05-01 MED ORDER — DILTIAZEM HCL 60 MG PO TABS
120.0000 mg | ORAL_TABLET | ORAL | Status: AC
  Administered 2021-05-01: 120 mg via ORAL
  Filled 2021-05-01: qty 2

## 2021-05-01 MED ORDER — CARVEDILOL 25 MG PO TABS
25.0000 mg | ORAL_TABLET | Freq: Two times a day (BID) | ORAL | Status: DC
  Administered 2021-05-01 (×2): 25 mg via ORAL
  Filled 2021-05-01 (×2): qty 1

## 2021-05-01 MED ORDER — DILTIAZEM HCL 60 MG PO TABS
60.0000 mg | ORAL_TABLET | Freq: Four times a day (QID) | ORAL | Status: DC
  Administered 2021-05-01 – 2021-05-02 (×5): 60 mg via ORAL
  Filled 2021-05-01 (×6): qty 1

## 2021-05-01 NOTE — Progress Notes (Signed)
PROGRESS NOTE    MISTER KRAHENBUHL  TML:[id number] DOB: 1946-10-10 DOA: 04/23/2021 PCP: Binnie Rail, MD   Brief Narrative: HPI per Dr. Flossie Buffy 05/16/2021 Sergio Mack is a 75 y.o. male with medical history significant for HFrEF (EF 65-70% 04/2018), HTN, persistent atrial fibrillation on Eliquis, history of alcoholic withdrawal seizures, and prediabetes who was brought in by his wife for concerns of heavy alcohol use.   Wife is not at bedside at this time.  Patient reports that he started drinking for the past 2 weeks after his son passed away.  He states because of his son's death is still unknown.  He has been drinking up to half a gallon of vodka every 2 days.  Has not had much to eat or drink.  No nausea, vomiting abdominal pain.  Has had diarrhea and noted to be dark.  States he takes iron supplementation.  He reports that he continues to take his medication including Eliquis and his antiepileptic. Last alcoholic drink this morning.   ED Course: He was hypotensive down to 80/52 which improved to systolic of 681 after 3 liters of IV fluid.  Fecal occult blood test was confirmed to be positive by ED physician Dr. Roderic Palau and patient was given Andexxa for concerns of massive GI bleed initially prior to labs returning. Later hemoglobin found to be stable at 11.4.  Platelet of 70.  Sodium 132.  AKI of 2.7 from prior 1.09.  Glucose of 69.  Lactate of 3.3.  AST of 126.  ALT of 91.  Gap of 21.  Alcohol level 211.  Assessment & Plan:   Principal Problem:   Hypotension Active Problems:   HFrEF (heart failure with reduced ejection fraction) (HCC)   Persistent atrial fibrillation (HCC)   Alcoholism (HCC)   Metabolic acidosis   AKI (acute kidney injury) (HCC)   Thrombocytopenia (HCC)   Transaminitis   GI bleed   Melena   Acute blood loss anemia   Elevated LFTs   Gastritis and gastroduodenitis  #1 alcohol abuse-patient started drinking heavily since the death of his son.  He is tachypneic  tachycardic, delirious and hallucinating due to alcohol withdrawal.   Blood pressure improved with IV fluids.   Continue CIWA protocol. His alcohol level was over 200 on admission.  Patient's son was found dead at his home by his grandson.  He apparently died of a heart attack 2 weeks ago.  Patient has been drinking heavily ever since.  They only have 1 child.  #2 hypotension resolved with IV fluids  #3 lactic acidosis also resolved with IV fluids.  Blood culture no growth.  Urine culture with multiple species.  #4 dehydration secondary to decreased p.o. intake and alcohol abuse-on IV fluids  #5 prolonged QTC on Tikosyn Coreg.  Cardiology consulted restarted Coreg.  Mag is 2.6  K is 4.0   #6 chronic persistent atrial fibrillation with RVR patient received an extra dose of Coreg this morning 6.25 mg.  Increase the dose of Coreg to 12.5 twice a day..Eliquis on hold for concern for GI bleed. Candidate to restart Tikosyn at this time with no anticoagulation on board.  #7gi bleed-fobt positive has black stools.  GI consulted.  EGD 04/24/2021-esophageal mucosal changes consistent with long segment Barrett's esophagus not biopsied, gastritis with erosions biopsied to exclude H. pylori, granular mucosa in the duodenum and biopsied, no evidence of active or recent bleeding.  Gastritis with erosions in the setting of alcohol and Eliquis may explain melena.  Continue Protonix.  Received Andexxa in the ED.  #8 seizures on Keppra  #9 AKI prerenal secondary to dehydration hypotension hypovolemia resolved with IV fluids.  Creatinine today is 0.75.   #10 thrombocytopenia likely due to alcohol abuse, improving  #11 disposition-patient will need SNF however he is not medically stable to be discharged   Estimated body mass index is 23.75 kg/m as calculated from the following:   Height as of this encounter: 6\' 1"  (1.854 m).   Weight as of this encounter: 81.6 kg.  DVT prophylaxis: SCD  code Status: Full  code Family Communication: Discussed with his wife over the phone his wife is his healthcare POA  disposition Plan:  Status is: Inpatient  Dispo: The patient is from: Home              Anticipated d/c is to: Home              Patient currently is not medically stable to d/c.   Difficult to place patient No    Consultants: gi  Procedures: None Antimicrobials: None  Subjective: He is still in A. fib RVR Coreg dose increased to 12.5 twice a day Cardizem added this morning  Objective: Vitals:   05/01/21 0600 05/01/21 0900 05/01/21 1100 05/01/21 1200  BP:    111/77  Pulse:   (!) 115   Resp:      Temp: 99 F (37.2 C) 97.7 F (36.5 C)    TempSrc:      SpO2:      Weight:      Height:        Intake/Output Summary (Last 24 hours) at 05/01/2021 1257 Last data filed at 05/01/2021 1200 Gross per 24 hour  Intake 1000 ml  Output 900 ml  Net 100 ml    Filed Weights   04/23/2021 1609  Weight: 81.6 kg    Examination:  General exam: Appears on and off confusion respiratory system: Clear to auscultation. Respiratory effort normal. Cardiovascular system: S1 & S2 heard, RRR. No JVD, murmurs, rubs, gallops or clicks. No pedal edema. Gastrointestinal system: Abdomen is distended, soft and nontender. No organomegaly or masses felt. Normal bowel sounds heard. Central nervous system: Confused delirious not following commands  extremities: Trace edema. Skin: No rashes, lesions or ulcers Psychiatry: Judgment is impaired  Data Reviewed: I have personally reviewed following labs and imaging studies  CBC: Recent Labs  Lab 05/10/2021 1605 05/02/2021 1701 04/28/21 1619 05/19/2021 0515 04/30/2021 0744 05/08/2021 1621 05/01/21 0717  WBC 9.2   < > 7.4 5.4 5.5 4.6 5.5  NEUTROABS 6.4  --   --   --   --   --   --   HGB 11.4*   < > 10.0* 8.4* 8.9* 9.1* 9.7*  HCT 35.1*   < > 29.9* 25.2* 26.4* 27.2* 29.3*  MCV 93.1   < > 91.4 91.3 91.3 91.3 92.7  PLT 70*   < > 52* 45* 48* 52* 92*   < > = values in  this interval not displayed.    Basic Metabolic Panel: Recent Labs  Lab 04/28/21 0500 04/28/21 1516 05/18/2021 0515 04/30/21 0516 04/30/21 1102 04/30/21 1300 05/01/21 0525  NA 136   < > 137 138 140 140 141  K 3.7   < > 3.1* 3.3* 3.7 4.0 4.4  CL 100   < > 103 103 102 102 104  CO2 14*   < > 26 27 28 29 28   GLUCOSE 123*   < >  109* 104* 134* 120* 108*  BUN 28*   < > 15 7* 7* 6* 7*  CREATININE 1.69*   < > 1.13 0.71 0.71 0.84 0.75  CALCIUM 7.3*   < > 8.2* 8.0* 8.1* 8.2* 8.4*  MG 2.4  --  1.8 1.6*  --  2.6* 2.1   < > = values in this interval not displayed.    GFR: Estimated Creatinine Clearance: 90.2 mL/min (by C-G formula based on SCr of 0.75 mg/dL). Liver Function Tests: Recent Labs  Lab 05/16/2021 1605 05/10/2021 0515 04/30/21 0516 05/01/21 0525  AST 146* 63* 61* 58*  ALT 91* 57* 50* 51*  ALKPHOS 51 48 45 52  BILITOT 1.1 1.5* 1.4* 1.3*  PROT 5.7* 5.0* 4.8* 5.5*  ALBUMIN 3.3* 2.8* 2.7* 3.0*    No results for input(s): LIPASE, AMYLASE in the last 168 hours. No results for input(s): AMMONIA in the last 168 hours. Coagulation Profile: Recent Labs  Lab 04/21/2021 1605  INR 1.6*    Cardiac Enzymes: No results for input(s): CKTOTAL, CKMB, CKMBINDEX, TROPONINI in the last 168 hours. BNP (last 3 results) No results for input(s): PROBNP in the last 8760 hours. HbA1C: No results for input(s): HGBA1C in the last 72 hours. CBG: No results for input(s): GLUCAP in the last 168 hours. Lipid Profile: No results for input(s): CHOL, HDL, LDLCALC, TRIG, CHOLHDL, LDLDIRECT in the last 72 hours. Thyroid Function Tests: No results for input(s): TSH, T4TOTAL, FREET4, T3FREE, THYROIDAB in the last 72 hours. Anemia Panel: No results for input(s): VITAMINB12, FOLATE, FERRITIN, TIBC, IRON, RETICCTPCT in the last 72 hours. Sepsis Labs: Recent Labs  Lab 05/15/2021 2351 04/28/21 0500 04/28/21 1222 04/28/21 1520  LATICACIDVEN 4.3* 4.0* 1.8 1.9     Recent Results (from the past 240  hour(s))  Resp Panel by RT-PCR (Flu A&B, Covid) Nasopharyngeal Swab     Status: None   Collection Time: 04/24/2021  4:05 PM   Specimen: Nasopharyngeal Swab; Nasopharyngeal(NP) swabs in vial transport medium  Result Value Ref Range Status   SARS Coronavirus 2 by RT PCR NEGATIVE NEGATIVE Final    Comment: (NOTE) SARS-CoV-2 target nucleic acids are NOT DETECTED.  The SARS-CoV-2 RNA is generally detectable in upper respiratory specimens during the acute phase of infection. The lowest concentration of SARS-CoV-2 viral copies this assay can detect is 138 copies/mL. A negative result does not preclude SARS-Cov-2 infection and should not be used as the sole basis for treatment or other patient management decisions. A negative result may occur with  improper specimen collection/handling, submission of specimen other than nasopharyngeal swab, presence of viral mutation(s) within the areas targeted by this assay, and inadequate number of viral copies(<138 copies/mL). A negative result must be combined with clinical observations, patient history, and epidemiological information. The expected result is Negative.  Fact Sheet for Patients:  EntrepreneurPulse.com.au  Fact Sheet for Healthcare Providers:  IncredibleEmployment.be  This test is no t yet approved or cleared by the [place] FDA and  has been authorized for detection and/or diagnosis of SARS-CoV-2 by FDA under an Emergency Use Authorization (EUA). This EUA will remain  in effect (meaning this test can be used) for the duration of the COVID-19 declaration under Section 564(b)(1) of the Act, 21 U.S.C.section 360bbb-3(b)(1), unless the authorization is terminated  or revoked sooner.       Influenza A by PCR NEGATIVE NEGATIVE Final   Influenza B by PCR NEGATIVE NEGATIVE Final    Comment: (NOTE) The Xpert Xpress SARS-CoV-2/FLU/RSV plus assay is  intended as an aid in the diagnosis of influenza from  Nasopharyngeal swab specimens and should not be used as a sole basis for treatment. Nasal washings and aspirates are unacceptable for Xpert Xpress SARS-CoV-2/FLU/RSV testing.  Fact Sheet for Patients: EntrepreneurPulse.com.au  Fact Sheet for Healthcare Providers: IncredibleEmployment.be  This test is not yet approved or cleared by the [place] FDA and has been authorized for detection and/or diagnosis of SARS-CoV-2 by FDA under an Emergency Use Authorization (EUA). This EUA will remain in effect (meaning this test can be used) for the duration of the COVID-19 declaration under Section 564(b)(1) of the Act, 21 U.S.C. section 360bbb-3(b)(1), unless the authorization is terminated or revoked.  Performed at [facility], [place] [address] [place], [place] [address]   Blood Culture (routine x 2)     Status: None (Preliminary result)   Collection Time: 05/05/2021  4:40 PM   Specimen: BLOOD  Result Value Ref Range Status   Specimen Description   Final    BLOOD LEFT ANTECUBITAL Performed at [facility] [address] [place], [place] [address]    Special Requests   Final    BOTTLES DRAWN AEROBIC AND ANAEROBIC Blood Culture adequate volume Performed at [facility] [address], [place], [place] [address]    Culture   Final    NO GROWTH 4 DAYS Performed at [facility], [place] [address], [place], [place] [address]    Report Status PENDING  Incomplete  Blood Culture (routine x 2)     Status: None (Preliminary result)   Collection Time: 05/19/2021  4:40 PM   Specimen: BLOOD  Result Value Ref Range Status   Specimen Description   Final    BLOOD RIGHT ANTECUBITAL Performed at [facility] [address] [place], [place] [address]    Special Requests   Final    BOTTLES DRAWN AEROBIC AND ANAEROBIC Blood Culture adequate volume Performed at [facility] [address] [place], [place] [address]    Culture   Final    NO GROWTH 4 DAYS Performed at [facility], [place] [address], [place], [place] [address]    Report Status PENDING  Incomplete  Urine culture     Status: Abnormal   Collection Time: 05/09/2021  8:30 PM   Specimen: In/Out Cath Urine  Result Value Ref Range Status   Specimen Description   Final    IN/OUT CATH URINE Performed at [facility] [address] [place], [place] [address]    Special Requests   Final    NONE Performed at [facility], [place] [address], [place], [place] [address]    Culture MULTIPLE SPECIES PRESENT, SUGGEST RECOLLECTION (A)  Final   Report Status 05/12/2021 FINAL  Final         Radiology Studies: ECHOCARDIOGRAM COMPLETE  Result Date: 04/30/2021    ECHOCARDIOGRAM REPORT   Patient Name:   GARREN GREENMAN Date of Exam: 04/30/2021 Medical Rec #:  [id number]       Height:       73.0 in Accession #:    [id number]      Weight:       180.0 lb Date of Birth:  September 01, 1946       BSA:          2.057 m Patient Age:    27 years        BP:  118/68 mmHg Patient Gender: M               HR:           127 bpm. Exam Location:  Inpatient Procedure: 2D Echo, Cardiac Doppler and Color Doppler Indications:    Dyspnea R06.00  History:        Patient has prior history of Echocardiogram examinations, most                 recent 05/17/2018. Arrythmias:Atrial Fibrillation,                 Signs/Symptoms:Syncope; Risk Factors:Hypertension, Dyslipidemia                 and GERD.  Sonographer:    Jonelle Sidle Dance Referring Phys: [id number] HAO MENG  Sonographer Comments: Image acquisition challenging due to uncooperative patient. IMPRESSIONS  1. Left ventricular ejection fraction, by estimation, is 60 to 65%. The left ventricle has normal function. The left ventricle has no regional wall motion abnormalities. Left ventricular diastolic function could not be evaluated.  2. Right  ventricular systolic function is normal. The right ventricular size is normal. There is normal pulmonary artery systolic pressure. The estimated right ventricular systolic pressure is 80.9 mmHg.  3. The mitral valve is normal in structure. Mild mitral valve regurgitation. No evidence of mitral stenosis.  4. The aortic valve is normal in structure. Aortic valve regurgitation is not visualized. No aortic stenosis is present.  5. The inferior vena cava is dilated in size with >50% respiratory variability, suggesting right atrial pressure of 8 mmHg. FINDINGS  Left Ventricle: Left ventricular ejection fraction, by estimation, is 60 to 65%. The left ventricle has normal function. The left ventricle has no regional wall motion abnormalities. The left ventricular internal cavity size was normal in size. There is  no left ventricular hypertrophy. Left ventricular diastolic function could not be evaluated due to atrial fibrillation. Left ventricular diastolic function could not be evaluated. Right Ventricle: The right ventricular size is normal. No increase in right ventricular wall thickness. Right ventricular systolic function is normal. There is normal pulmonary artery systolic pressure. The tricuspid regurgitant velocity is 2.13 m/s, and  with an assumed right atrial pressure of 8 mmHg, the estimated right ventricular systolic pressure is 98.3 mmHg. Left Atrium: Left atrial size was normal in size. Right Atrium: Right atrial size was normal in size. Pericardium: There is no evidence of pericardial effusion. Mitral Valve: The mitral valve is normal in structure. Mild mitral valve regurgitation. No evidence of mitral valve stenosis. Tricuspid Valve: The tricuspid valve is normal in structure. Tricuspid valve regurgitation is not demonstrated. No evidence of tricuspid stenosis. Aortic Valve: The aortic valve is normal in structure. Aortic valve regurgitation is not visualized. No aortic stenosis is present. Pulmonic Valve:  The pulmonic valve was normal in structure. Pulmonic valve regurgitation is trivial. No evidence of pulmonic stenosis. Aorta: The aortic root is normal in size and structure. Venous: The inferior vena cava is dilated in size with greater than 50% respiratory variability, suggesting right atrial pressure of 8 mmHg. IAS/Shunts: No atrial level shunt detected by color flow Doppler.  IVC IVC diam: 2.30 cm AORTIC VALVE LVOT Vmax:   74.35 cm/s LVOT Vmean:  46.550 cm/s LVOT VTI:    0.103 m TRICUSPID VALVE TR Peak grad:   18.1 mmHg TR Vmax:        213.00 cm/s  SHUNTS Systemic VTI: 0.10 m Fransico Him MD Electronically signed by Tressia Miners  Turner MD Signature Date/Time: 04/30/2021/4:07:15 PM    Final          Scheduled Meds:  carvedilol  25 mg Oral BID WC   cholecalciferol  2,000 Units Oral Daily   diltiazem  60 mg Oral Q6H   ferrous sulfate  325 mg Oral Q breakfast   folic acid  1 mg Oral Daily   levETIRAcetam  500 mg Oral BID   multivitamin with minerals  1 tablet Oral Daily   pantoprazole  40 mg Oral QAC breakfast   potassium chloride  40 mEq Oral Once   potassium chloride  40 mEq Oral BID   thiamine  100 mg Oral Daily   Or   thiamine  100 mg Intravenous Daily   vitamin B-12  1,000 mcg Oral Daily   Continuous Infusions:     LOS: 3 days   Georgette Shell, MD  05/01/2021, 12:57 PM

## 2021-05-01 NOTE — Plan of Care (Signed)
  Problem: Pain Managment: Goal: General experience of comfort will improve Outcome: Progressing   

## 2021-05-01 NOTE — Progress Notes (Signed)
   04/30/21 2020  Assess: MEWS Score  Temp 98.1 F (36.7 C)  BP 102/87  ECG Heart Rate (!) 119  Resp (!) 23  Level of Consciousness Responds to Voice  SpO2 99 %  O2 Device Room Air  Assess: MEWS Score  MEWS Temp 0  MEWS Systolic 0  MEWS Pulse 2  MEWS RR 1  MEWS LOC 1  MEWS Score 4  MEWS Score Color Red  Assess: if the MEWS score is Yellow or Red  Were vital signs taken at a resting state? Yes  Focused Assessment No change from prior assessment  Does the patient meet 2 or more of the SIRS criteria? Yes  Does the patient have a confirmed or suspected source of infection? No  MEWS guidelines implemented *See Row Information* No, previously red, continue vital signs every 4 hours  Treat  MEWS Interventions Administered scheduled meds/treatments  Pain Scale PAINAD  Pain Score 0  Breathing 0  Negative Vocalization 0  Facial Expression 0  Body Language 0  Consolability 0  PAINAD Score 0  Take Vital Signs  Increase Vital Sign Frequency  Red: Q 1hr X 4 then Q 4hr X 4, if remains red, continue Q 4hrs  Escalate  MEWS: Escalate Red: discuss with charge nurse/RN and provider, consider discussing with RRT  Notify: Charge Nurse/RN  Name of Charge Nurse/RN Notified Mechele Claude  Date Charge Nurse/RN Notified 04/30/21  Time Charge Nurse/RN Notified 2025  Notify: Provider  Provider Name/Title Adrian Prince MD  Date Provider Notified 04/30/21  Time Provider Notified 2020  Notification Type Page  Notification Reason Other (Comment) (to extend PRN ativan order and requesting PRN HR medication)  Provider response See new orders  Date of Provider Response 04/30/21  Time of Provider Response 2020  Notify: Rapid Response  Name of Rapid Response RN Notified Genell  Date Rapid Response Notified 04/30/21  Time Rapid Response Notified 2020  Document  Patient Outcome Stabilized after interventions  Progress note created (see row info) Yes  Assess: SIRS CRITERIA  SIRS Temperature  0  SIRS Pulse  1  SIRS Respirations  1  SIRS WBC 0  SIRS Score Sum  2  Patient has hx of Afib, HR has been from 130-150 during day shift on bedside monitor. He is on Coreg and had lopressor x2  during day shift. Patient is also on CIWA protocol, paged MD for PRN rate control medication and PRN ativan renewal , only received PRN ativan order. Patient was sedative on initial assessment but became alert right after. HR remain irregular 130-140's.  Alert and oriented to self , asymptomatic and able to follow commands, will continue to monitor

## 2021-05-01 NOTE — Progress Notes (Signed)
Progress Note  Patient Name: Sergio Mack Date of Encounter: 05/01/2021  [facility] Cardiologist: Thompson Grayer, MD   Subjective   Delirium continues   Inpatient Medications    Scheduled Meds:  carvedilol  12.5 mg Oral BID WC   cholecalciferol  2,000 Units Oral Daily   ferrous sulfate  325 mg Oral Q breakfast   folic acid  1 mg Oral Daily   levETIRAcetam  500 mg Oral BID   multivitamin with minerals  1 tablet Oral Daily   pantoprazole  40 mg Oral QAC breakfast   potassium chloride  40 mEq Oral Once   potassium chloride  40 mEq Oral BID   thiamine  100 mg Oral Daily   Or   thiamine  100 mg Intravenous Daily   vitamin B-12  1,000 mcg Oral Daily   Continuous Infusions:   PRN Meds: LORazepam **OR** LORazepam   Vital Signs    Vitals:   04/30/21 2201 04/30/21 2300 05/01/21 0200 05/01/21 0600  BP:      Pulse:      Resp:      Temp: 98 F (36.7 C) 98.1 F (36.7 C) 98.5 F (36.9 C) 99 F (37.2 C)  TempSrc: Oral     SpO2: 96%     Weight:      Height:        Intake/Output Summary (Last 24 hours) at 05/01/2021 0751 Last data filed at 05/01/2021 0600 Gross per 24 hour  Intake 92.87 ml  Output 1300 ml  Net -1207.13 ml   Last 3 Weights 04/30/2021 02/03/2021 08/06/2020  Weight (lbs) 180 lb 192 lb 3.2 oz 174 lb 12.8 oz  Weight (kg) 81.647 kg 87.181 kg 79.289 kg      Telemetry    Afib rates 1110-150 bppm 05/01/2021   ECG    No new - Personally Reviewed  Physical Exam   GEN: No acute distress.  Delirium  Neck: No JVD Cardiac: RRR, no murmurs, rubs, or gallops.  Respiratory: Clear to auscultation bilaterally. GI: Soft, nontender, non-distended  MS: No edema; No deformity. Neuro:  Nonfocal sedated Psych: Normal affect  SKIN:  + bruising on arms   Labs    High Sensitivity Troponin:  No results for input(s): TROPONINIHS in the last 720 hours.    Chemistry Recent Labs  Lab 05/20/2021 0515 04/30/21 0516 04/30/21 1102 04/30/21 1300 05/01/21 0525   NA 137 138 140 140 141  K 3.1* 3.3* 3.7 4.0 4.4  CL 103 103 102 102 104  CO2 26 27 28 29 28   GLUCOSE 109* 104* 134* 120* 108*  BUN 15 7* 7* 6* 7*  CREATININE 1.13 0.71 0.71 0.84 0.75  CALCIUM 8.2* 8.0* 8.1* 8.2* 8.4*  PROT 5.0* 4.8*  --   --  5.5*  ALBUMIN 2.8* 2.7*  --   --  3.0*  AST 63* 61*  --   --  58*  ALT 57* 50*  --   --  51*  ALKPHOS 48 45  --   --  52  BILITOT 1.5* 1.4*  --   --  1.3*  GFRNONAA >60 >60 >60 >60 >60  ANIONGAP 8 8 10 9 9      Hematology Recent Labs  Lab 05/01/2021 0515 04/24/2021 0744 04/22/2021 1621  WBC 5.4 5.5 4.6  RBC 2.76* 2.89* 2.98*  HGB 8.4* 8.9* 9.1*  HCT 25.2* 26.4* 27.2*  MCV 91.3 91.3 91.3  MCH 30.4 30.8 30.5  MCHC 33.3 33.7 33.5  RDW  15.9* 15.6* 15.8*  PLT 45* 48* 52*    BNPNo results for input(s): BNP, PROBNP in the last 168 hours.   DDimer No results for input(s): DDIMER in the last 168 hours.   Radiology    ECHOCARDIOGRAM COMPLETE  Result Date: 04/30/2021    ECHOCARDIOGRAM REPORT   Patient Name:   Sergio Mack Date of Exam: 04/30/2021 Medical Rec #:  [id number]       Height:       73.0 in Accession #:    [id number]      Weight:       180.0 lb Date of Birth:  October 19, 1946       BSA:          2.057 m Patient Age:    75 years        BP:           118/68 mmHg Patient Gender: M               HR:           127 bpm. Exam Location:  Inpatient Procedure: 2D Echo, Cardiac Doppler and Color Doppler Indications:    Dyspnea R06.00  History:        Patient has prior history of Echocardiogram examinations, most                 recent 05/17/2018. Arrythmias:Atrial Fibrillation,                 Signs/Symptoms:Syncope; Risk Factors:Hypertension, Dyslipidemia                 and GERD.  Sonographer:    Jonelle Sidle Dance Referring Phys: [id number] HAO MENG  Sonographer Comments: Image acquisition challenging due to uncooperative patient. IMPRESSIONS  1. Left ventricular ejection fraction, by estimation, is 60 to 65%. The left ventricle has normal function. The left  ventricle has no regional wall motion abnormalities. Left ventricular diastolic function could not be evaluated.  2. Right ventricular systolic function is normal. The right ventricular size is normal. There is normal pulmonary artery systolic pressure. The estimated right ventricular systolic pressure is 44.0 mmHg.  3. The mitral valve is normal in structure. Mild mitral valve regurgitation. No evidence of mitral stenosis.  4. The aortic valve is normal in structure. Aortic valve regurgitation is not visualized. No aortic stenosis is present.  5. The inferior vena cava is dilated in size with >50% respiratory variability, suggesting right atrial pressure of 8 mmHg. FINDINGS  Left Ventricle: Left ventricular ejection fraction, by estimation, is 60 to 65%. The left ventricle has normal function. The left ventricle has no regional wall motion abnormalities. The left ventricular internal cavity size was normal in size. There is  no left ventricular hypertrophy. Left ventricular diastolic function could not be evaluated due to atrial fibrillation. Left ventricular diastolic function could not be evaluated. Right Ventricle: The right ventricular size is normal. No increase in right ventricular wall thickness. Right ventricular systolic function is normal. There is normal pulmonary artery systolic pressure. The tricuspid regurgitant velocity is 2.13 m/s, and  with an assumed right atrial pressure of 8 mmHg, the estimated right ventricular systolic pressure is 10.2 mmHg. Left Atrium: Left atrial size was normal in size. Right Atrium: Right atrial size was normal in size. Pericardium: There is no evidence of pericardial effusion. Mitral Valve: The mitral valve is normal in structure. Mild mitral valve regurgitation. No evidence of mitral valve stenosis. Tricuspid Valve: The tricuspid valve is normal in  structure. Tricuspid valve regurgitation is not demonstrated. No evidence of tricuspid stenosis. Aortic Valve: The aortic  valve is normal in structure. Aortic valve regurgitation is not visualized. No aortic stenosis is present. Pulmonic Valve: The pulmonic valve was normal in structure. Pulmonic valve regurgitation is trivial. No evidence of pulmonic stenosis. Aorta: The aortic root is normal in size and structure. Venous: The inferior vena cava is dilated in size with greater than 50% respiratory variability, suggesting right atrial pressure of 8 mmHg. IAS/Shunts: No atrial level shunt detected by color flow Doppler.  IVC IVC diam: 2.30 cm AORTIC VALVE LVOT Vmax:   74.35 cm/s LVOT Vmean:  46.550 cm/s LVOT VTI:    0.103 m TRICUSPID VALVE TR Peak grad:   18.1 mmHg TR Vmax:        213.00 cm/s  SHUNTS Systemic VTI: 0.10 m Fransico Him MD Electronically signed by Fransico Him MD Signature Date/Time: 04/30/2021/4:07:15 PM    Final     Cardiac Studies   Echo scheduled for today  Patient Profile     75 y.o. male with a hx of PAF, RBBB, HTN, HLD, prediabetes and a history of alcohol withdrawal and now due to depression of son's death, has been drinking more and not eating.  Admitted with heme + stools, hypotension responded to fluids, and prolonged QTC.Marland Kitchen pt was on Eliquis and given andexxa to reverse..   Assessment & Plan   Afib:  see note from Dr Lovena Le Given ETOH abuse he is off anticoagulation and not a candidate for AAT/DCC. Tikosyn needs to be continued held. Rate control only option will increase coreg to 12.5 mg bid. If needed primary service can add cardizem drip if  PO intake an issue. K supplemented QT down but hard to tell in ECG this am due to rapid rate in afib 154 bpm   Alcohol abuse             -Started in early May after learning his son's death.  Patient is the mourning             -Wife says he likely drank 5 and half gallons of vodka since early May.             -CIWA protocol.    3. GI bleed:  Hct stable at 29.3 this am   Dr Johney Frame covering cardiology service today      Jenkins Rouge MD [facility]

## 2021-05-01 NOTE — Progress Notes (Signed)
PT Cancellation Note  Patient Details Name: Sergio Mack MRN: [id number] DOB: 1946-09-30   Cancelled Treatment:    Reason Eval/Treat Not Completed: Medical issues which prohibited therapy;Fatigue/lethargy limiting ability to participate (Per RN, pt is not able to follow commands, is sleeping soundly after having ativan. He's not able to participate in PT at present. Will follow.)  Philomena Doheny PT 05/01/2021  Acute Rehabilitation Services Pager [phone] Office [phone]

## 2021-05-02 ENCOUNTER — Inpatient Hospital Stay (HOSPITAL_COMMUNITY): Payer: Medicare Other

## 2021-05-02 LAB — COMPREHENSIVE METABOLIC PANEL
ALT: 47 U/L — ABNORMAL HIGH (ref 0–44)
AST: 46 U/L — ABNORMAL HIGH (ref 15–41)
Albumin: 2.9 g/dL — ABNORMAL LOW (ref 3.5–5.0)
Alkaline Phosphatase: 50 U/L (ref 38–126)
Anion gap: 7 (ref 5–15)
BUN: 6 mg/dL — ABNORMAL LOW (ref 8–23)
CO2: 23 mmol/L (ref 22–32)
Calcium: 8.5 mg/dL — ABNORMAL LOW (ref 8.9–10.3)
Chloride: 110 mmol/L (ref 98–111)
Creatinine, Ser: 0.76 mg/dL (ref 0.61–1.24)
GFR, Estimated: >60 mL/min (ref >60)
Glucose, Bld: 113 mg/dL — ABNORMAL HIGH (ref 70–99)
Potassium: 4.8 mmol/L (ref 3.5–5.1)
Sodium: 140 mmol/L (ref 135–145)
Total Bilirubin: 1.3 mg/dL — ABNORMAL HIGH (ref 0.3–1.2)
Total Protein: 5.5 g/dL — ABNORMAL LOW (ref 6.5–8.1)

## 2021-05-02 LAB — CBC
HCT: 28.5 % — ABNORMAL LOW (ref 39.0–52.0)
Hemoglobin: 9.1 g/dL — ABNORMAL LOW (ref 13.0–17.0)
MCH: 30.2 pg (ref 26.0–34.0)
MCHC: 31.9 g/dL (ref 30.0–36.0)
MCV: 94.7 fL (ref 80.0–100.0)
Platelets: 127 10*3/uL — ABNORMAL LOW (ref 150–400)
RBC: 3.01 MIL/uL — ABNORMAL LOW (ref 4.22–5.81)
RDW: 15.8 % — ABNORMAL HIGH (ref 11.5–15.5)
WBC: 5.4 10*3/uL (ref 4.0–10.5)
nRBC: 0 % (ref 0.0–0.2)

## 2021-05-02 LAB — FOLATE: Folate: 34.1 ng/mL (ref >5.9)

## 2021-05-02 LAB — CULTURE, BLOOD (ROUTINE X 2)
Culture: NO GROWTH
Culture: NO GROWTH
Special Requests: ADEQUATE
Special Requests: ADEQUATE

## 2021-05-02 LAB — AMMONIA: Ammonia: 18 umol/L (ref 9–35)

## 2021-05-02 LAB — TSH: TSH: 2.332 u[IU]/mL (ref 0.350–4.500)

## 2021-05-02 LAB — MAGNESIUM: Magnesium: 1.7 mg/dL (ref 1.7–2.4)

## 2021-05-02 LAB — VITAMIN B12: Vitamin B-12: 2277 pg/mL — ABNORMAL HIGH (ref 180–914)

## 2021-05-02 MED ORDER — THIAMINE HCL 100 MG/ML IJ SOLN: 100.0000 mg | Freq: Every day | INTRAMUSCULAR | Status: DC

## 2021-05-02 MED ORDER — THIAMINE HCL 100 MG/ML IJ SOLN
500.0000 mg | Freq: Three times a day (TID) | INTRAVENOUS | Status: DC
  Administered 2021-05-02 – 2021-05-03 (×3): 500 mg via INTRAVENOUS
  Filled 2021-05-02 (×4): qty 5

## 2021-05-02 MED ORDER — THIAMINE HCL 100 MG/ML IJ SOLN: 250.0000 mg | Freq: Every day | INTRAVENOUS | Status: DC

## 2021-05-02 MED ORDER — MAGNESIUM SULFATE 2 GM/50ML IV SOLN
2.0000 g | Freq: Once | INTRAVENOUS | Status: AC
  Administered 2021-05-02: 2 g via INTRAVENOUS
  Filled 2021-05-02: qty 50

## 2021-05-02 MED ORDER — SODIUM CHLORIDE 0.9 % IV BOLUS
500.0000 mL | Freq: Once | INTRAVENOUS | Status: AC
  Administered 2021-05-02: 500 mL via INTRAVENOUS

## 2021-05-02 MED ORDER — LEVETIRACETAM IN NACL 1500 MG/100ML IV SOLN
1500.0000 mg | Freq: Once | INTRAVENOUS | Status: AC
  Administered 2021-05-02: 1500 mg via INTRAVENOUS
  Filled 2021-05-02: qty 100

## 2021-05-02 MED ORDER — CHLORHEXIDINE GLUCONATE CLOTH 2 % EX PADS
6.0000 | MEDICATED_PAD | Freq: Every day | CUTANEOUS | Status: DC
  Administered 2021-05-02 – 2021-05-03 (×2): 6 via TOPICAL

## 2021-05-02 MED ORDER — LEVETIRACETAM IN NACL 500 MG/100ML IV SOLN
500.0000 mg | Freq: Two times a day (BID) | INTRAVENOUS | Status: DC
  Administered 2021-05-03: 500 mg via INTRAVENOUS
  Filled 2021-05-02 (×2): qty 100

## 2021-05-02 NOTE — Progress Notes (Addendum)
PROGRESS NOTE    Sergio Mack  TML:[id number] DOB: 1946-01-26 DOA: 05/04/2021 PCP: Binnie Rail, MD   Brief Narrative: 75 year old male with a past medical history significant for persistent atrial fibrillation, hypertension, diastolic heart failure, alcohol withdrawal seizures was brought in by his wife for concerns of heavy alcohol use.  Patient is a recovered alcohol user.  He started drinking 2 weeks prior to admission due to unexpected death of his son.  His son was found dead in his bed due to cardiac arrest/MI.  He has been drinking a gallon of vodka every 2 days.  He has not had anything to eat for weeks and has had no appetite.  He is going through grief.  Last alcohol drink was on the day of admission.  He is on Keppra and Eliquis.  Lives with his wife at home.  His alcohol level was 211 on admission.   Received Andexxa for concerns GI bleed as he was Hemoccult positive.  His hemoglobin on admission was 11.4 he was hypotensive 80/52.    Assessment & Plan:   Principal Problem:   Hypotension Active Problems:   HFrEF (heart failure with reduced ejection fraction) (HCC)   Persistent atrial fibrillation (HCC)   Alcoholism (HCC)   Metabolic acidosis   AKI (acute kidney injury) (HCC)   Thrombocytopenia (HCC)   Transaminitis   GI bleed   Melena   Acute blood loss anemia   Elevated LFTs   Gastritis and gastroduodenitis  #1 alcohol abuse-patient started drinking heavily since the death of his son.  He is tachypneic tachycardic, delirious and hallucinating due to alcohol withdrawal.   Blood pressure improved with IV fluids.   Continue CIWA protocol. His alcohol level was over 200 on admission.  Patient's son was found dead at his home by his grandson.  He apparently died of a heart attack 2 weeks ago.  Patient has been drinking heavily ever since.  They only have 1 child.  #2 hypotension resolved with IV fluids  #3 lactic acidosis also resolved with IV fluids.  Blood culture no  growth.  Urine culture with multiple species.  #4 dehydration secondary to decreased p.o. intake and alcohol abuse-on IV fluids  #5 prolonged QTC on Tikosyn Coreg.  Cardiology consulted restarted Coreg.  Mag is 2.6  K is 4.0   #6 chronic persistent atrial fibrillation with RVR -rate controlled better with adding Cardizem.  Continue Cardizem and Coreg.  His rate was better controlled with Cardizem than Coreg.  Eliquis is on hold due to recent GI bleed.  He should not be restarted on Eliquis at this time.  He is not a candidate to restart Tikosyn with ongoing alcohol abuse, persistent delirium.   DO NOT RESTART TYKOSYN OR ELIQUIS.  #7gi bleed-fobt positive has black stools.  He did not require any blood transfusions.  His hemoglobin was 8.4 from 10 on admission.  His hemoglobin remained stable at 9.1.  GI consulted.  EGD 05/06/2021-esophageal mucosal changes consistent with long segment Barrett's esophagus not biopsied, gastritis with erosions biopsied to exclude H. pylori, granular mucosa in the duodenum and biopsied, no evidence of active or recent bleeding.  Gastritis with erosions in the setting of alcohol and Eliquis may explain melena.  Continue Protonix.  Received Andexxa in the ED.  #8 seizures on Keppra.  Change Keppra to IV.  Check EEG.  Discussed with neurology.  #9 AKI prerenal secondary to dehydration hypotension hypovolemia resolved with IV fluids.  Creatinine today is 0.75.   #  10 thrombocytopenia likely due to alcohol abuse, improving 127 up from 92 yesterday.  #11 disposition-patient will need SNF however he is not medically stable to be discharged  #12 persistent delirium-we will check CT head, Ammonia level is 18 Folate 34 B12 2277 TSH 2.3 Check EEG?  Rule out seizure activity Will start him on high-dose thiamine for possible Wernicke's encephalopathy.  #13 CT abdomen concerning for pancreatic mass I ordered MRI of his abdomen however he is not able to follow commands or lay  still for an MRI.    #14 moderate left hydronephrosis by Ultrasound Place, Foley and recheck.  Renal function stable.  #15 goals of care discussed with patient's [place] his wife.  I have consulted palliative care.  Patient is DNR on admission.  With multiple comorbidities and declining functional status DNR is appropriate.  If he does not improve wife is willing to consider hospice.  #16 ?wernickes -start high-dose thiamine.  Discussed with neurology.  Addendum 5:42 PM-CT head no acute intracranial abnormality.  There is soft tissue air within the right masticator space likely within the venous plexus.  This is most likely iatrogenic in etiology.  Consider dedicated chest x-ray to rule out pneumomediastinum given history of recent EGD.   Estimated body mass index is 23.75 kg/m as calculated from the following:   Height as of this encounter: 6\' 1"  (1.854 m).   Weight as of this encounter: 81.6 kg.  DVT prophylaxis: SCD  code Status: Full code Family Communication: Discussed with his wife over the phone his wife is his healthcare POA  disposition Plan:  Status is: Inpatient  Dispo: The patient is from: Home              Anticipated d/c is to: Home              Patient currently is not medically stable to d/c.   Difficult to place patient No    Consultants: gi  Procedures: None Antimicrobials: None  Subjective: He is still in A. fib RVR Coreg dose increased to 12.5 twice a day Cardizem added this morning Remains confused delirious  Objective: Vitals:   05/02/21 0659 05/02/21 0800 05/02/21 1000 05/02/21 1200  BP: 118/77 132/80 140/70 134/80  Pulse: 91 84 94 85  Resp: (!) 28 (!) 24 (!) 29 (!) 31  Temp: 98.2 F (36.8 C) 98.1 F (36.7 C)  98.2 F (36.8 C)  TempSrc: Axillary Axillary  Axillary  SpO2: 93% 94% 97% 95%  Weight:      Height:        Intake/Output Summary (Last 24 hours) at 05/02/2021 1256 Last data filed at 05/01/2021 2300 Gross per 24 hour  Intake --   Output 800 ml  Net -800 ml    Filed Weights   05/13/2021 1609  Weight: 81.6 kg    Examination:  General exam: Appears on and off confusion respiratory system: Clear to auscultation. Respiratory effort normal. Cardiovascular system: S1 & S2 heard, RRR. No JVD, murmurs, rubs, gallops or clicks. No pedal edema. Gastrointestinal system: Abdomen is distended, soft and nontender. No organomegaly or masses felt. Normal bowel sounds heard. Central nervous system: Confused delirious not following commands  extremities: Trace edema.  Left upper extremity bruises bruises noted along the left chest wall present on admission after the fall at home Skin: No rashes, lesions or ulcers Psychiatry: Judgment is impaired  Data Reviewed: I have personally reviewed following labs and imaging studies  CBC: Recent Labs  Lab  04/26/2021 1605 05/08/2021 1701 05/05/2021 0515 05/13/2021 0744 05/20/2021 1621 05/01/21 0717 05/02/21 0602  WBC 9.2   < > 5.4 5.5 4.6 5.5 5.4  NEUTROABS 6.4  --   --   --   --   --   --   HGB 11.4*   < > 8.4* 8.9* 9.1* 9.7* 9.1*  HCT 35.1*   < > 25.2* 26.4* 27.2* 29.3* 28.5*  MCV 93.1   < > 91.3 91.3 91.3 92.7 94.7  PLT 70*   < > 45* 48* 52* 92* 127*   < > = values in this interval not displayed.    Basic Metabolic Panel: Recent Labs  Lab 05/17/2021 0515 04/30/21 0516 04/30/21 1102 04/30/21 1300 05/01/21 0525 05/02/21 0602  NA 137 138 140 140 141 140  K 3.1* 3.3* 3.7 4.0 4.4 4.8  CL 103 103 102 102 104 110  CO2 26 27 28 29 28 23   GLUCOSE 109* 104* 134* 120* 108* 113*  BUN 15 7* 7* 6* 7* 6*  CREATININE 1.13 0.71 0.71 0.84 0.75 0.76  CALCIUM 8.2* 8.0* 8.1* 8.2* 8.4* 8.5*  MG 1.8 1.6*  --  2.6* 2.1 1.7    GFR: Estimated Creatinine Clearance: 90.2 mL/min (by C-G formula based on SCr of 0.76 mg/dL). Liver Function Tests: Recent Labs  Lab 04/24/2021 1605 05/18/2021 0515 04/30/21 0516 05/01/21 0525 05/02/21 0602  AST 146* 63* 61* 58* 46*  ALT 91* 57* 50* 51* 47*  ALKPHOS  51 48 45 52 50  BILITOT 1.1 1.5* 1.4* 1.3* 1.3*  PROT 5.7* 5.0* 4.8* 5.5* 5.5*  ALBUMIN 3.3* 2.8* 2.7* 3.0* 2.9*    No results for input(s): LIPASE, AMYLASE in the last 168 hours. Recent Labs  Lab 05/02/21 1002  AMMONIA 18   Coagulation Profile: Recent Labs  Lab 05/04/2021 1605  INR 1.6*    Cardiac Enzymes: No results for input(s): CKTOTAL, CKMB, CKMBINDEX, TROPONINI in the last 168 hours. BNP (last 3 results) No results for input(s): PROBNP in the last 8760 hours. HbA1C: No results for input(s): HGBA1C in the last 72 hours. CBG: No results for input(s): GLUCAP in the last 168 hours. Lipid Profile: No results for input(s): CHOL, HDL, LDLCALC, TRIG, CHOLHDL, LDLDIRECT in the last 72 hours. Thyroid Function Tests: Recent Labs    05/02/21 1002  TSH 2.332   Anemia Panel: Recent Labs    05/02/21 1002  VITAMINB12 2,277*  FOLATE 34.1   Sepsis Labs: Recent Labs  Lab 05/19/2021 2351 04/28/21 0500 04/28/21 1222 04/28/21 1520  LATICACIDVEN 4.3* 4.0* 1.8 1.9     Recent Results (from the past 240 hour(s))  Resp Panel by RT-PCR (Flu A&B, Covid) Nasopharyngeal Swab     Status: None   Collection Time: 05/20/2021  4:05 PM   Specimen: Nasopharyngeal Swab; Nasopharyngeal(NP) swabs in vial transport medium  Result Value Ref Range Status   SARS Coronavirus 2 by RT PCR NEGATIVE NEGATIVE Final    Comment: (NOTE) SARS-CoV-2 target nucleic acids are NOT DETECTED.  The SARS-CoV-2 RNA is generally detectable in upper respiratory specimens during the acute phase of infection. The lowest concentration of SARS-CoV-2 viral copies this assay can detect is 138 copies/mL. A negative result does not preclude SARS-Cov-2 infection and should not be used as the sole basis for treatment or other patient management decisions. A negative result may occur with  improper specimen collection/handling, submission of specimen other than nasopharyngeal swab, presence of viral mutation(s) within  the areas targeted by this assay, and inadequate  number of viral copies(<138 copies/mL). A negative result must be combined with clinical observations, patient history, and epidemiological information. The expected result is Negative.  Fact Sheet for Patients:  EntrepreneurPulse.com.au  Fact Sheet for Healthcare Providers:  IncredibleEmployment.be  This test is no t yet approved or cleared by the [place] FDA and  has been authorized for detection and/or diagnosis of SARS-CoV-2 by FDA under an Emergency Use Authorization (EUA). This EUA will remain  in effect (meaning this test can be used) for the duration of the COVID-19 declaration under Section 564(b)(1) of the Act, 21 U.S.C.section 360bbb-3(b)(1), unless the authorization is terminated  or revoked sooner.       Influenza A by PCR NEGATIVE NEGATIVE Final   Influenza B by PCR NEGATIVE NEGATIVE Final    Comment: (NOTE) The Xpert Xpress SARS-CoV-2/FLU/RSV plus assay is intended as an aid in the diagnosis of influenza from Nasopharyngeal swab specimens and should not be used as a sole basis for treatment. Nasal washings and aspirates are unacceptable for Xpert Xpress SARS-CoV-2/FLU/RSV testing.  Fact Sheet for Patients: EntrepreneurPulse.com.au  Fact Sheet for Healthcare Providers: IncredibleEmployment.be  This test is not yet approved or cleared by the [place] FDA and has been authorized for detection and/or diagnosis of SARS-CoV-2 by FDA under an Emergency Use Authorization (EUA). This EUA will remain in effect (meaning this test can be used) for the duration of the COVID-19 declaration under Section 564(b)(1) of the Act, 21 U.S.C. section 360bbb-3(b)(1), unless the authorization is terminated or revoked.  Performed at [facility], [place] [address], [place], [place] [address]   Blood Culture (routine x 2)     Status:  None   Collection Time: 05/10/2021  4:40 PM   Specimen: BLOOD  Result Value Ref Range Status   Specimen Description   Final    BLOOD LEFT ANTECUBITAL Performed at [facility] [address], [place], [place] [address]    Special Requests   Final    BOTTLES DRAWN AEROBIC AND ANAEROBIC Blood Culture adequate volume Performed at [facility] [address] [place], [place] [address]    Culture   Final    NO GROWTH 5 DAYS Performed at [facility], [place] [address] [place], [place] [address]    Report Status 05/02/2021 FINAL  Final  Blood Culture (routine x 2)     Status: None   Collection Time: 04/30/2021  4:40 PM   Specimen: BLOOD  Result Value Ref Range Status   Specimen Description   Final    BLOOD RIGHT ANTECUBITAL Performed at [facility] [address], [place], [place] [address]    Special Requests   Final    BOTTLES DRAWN AEROBIC AND ANAEROBIC Blood Culture adequate volume Performed at [facility] [address] [place], [place] [address]    Culture   Final    NO GROWTH 5 DAYS Performed at [facility], [place] [address] [place], [place] [address]    Report Status 05/02/2021 FINAL  Final  Urine culture     Status: Abnormal   Collection Time: 05/08/2021  8:30 PM   Specimen: In/Out Cath Urine  Result Value Ref Range Status   Specimen Description   Final    IN/OUT CATH URINE Performed at [facility] [address] [place], [place] [address]    Special Requests   Final    NONE Performed at [facility],  [place] [address], [place], [place] [address]    Culture MULTIPLE SPECIES PRESENT, SUGGEST RECOLLECTION (A)  Final   Report Status 05/14/2021 FINAL  Final         Radiology Studies: ECHOCARDIOGRAM COMPLETE  Result Date: 04/30/2021    ECHOCARDIOGRAM REPORT   Patient Name:   SIDI DZIKOWSKI Date of Exam: 04/30/2021 Medical Rec #:   [id number]       Height:       73.0 in Accession #:    [id number]      Weight:       180.0 lb Date of Birth:  12/22/1945       BSA:          2.057 m Patient Age:    24 years        BP:           118/68 mmHg Patient Gender: M               HR:           127 bpm. Exam Location:  Inpatient Procedure: 2D Echo, Cardiac Doppler and Color Doppler Indications:    Dyspnea R06.00  History:        Patient has prior history of Echocardiogram examinations, most                 recent 05/17/2018. Arrythmias:Atrial Fibrillation,                 Signs/Symptoms:Syncope; Risk Factors:Hypertension, Dyslipidemia                 and GERD.  Sonographer:    Jonelle Sidle Dance Referring Phys: [id number] HAO MENG  Sonographer Comments: Image acquisition challenging due to uncooperative patient. IMPRESSIONS  1. Left ventricular ejection fraction, by estimation, is 60 to 65%. The left ventricle has normal function. The left ventricle has no regional wall motion abnormalities. Left ventricular diastolic function could not be evaluated.  2. Right ventricular systolic function is normal. The right ventricular size is normal. There is normal pulmonary artery systolic pressure. The estimated right ventricular systolic pressure is 13.2 mmHg.  3. The mitral valve is normal in structure. Mild mitral valve regurgitation. No evidence of mitral stenosis.  4. The aortic valve is normal in structure. Aortic valve regurgitation is not visualized. No aortic stenosis is present.  5. The inferior vena cava is dilated in size with >50% respiratory variability, suggesting right atrial pressure of 8 mmHg. FINDINGS  Left Ventricle: Left ventricular ejection fraction, by estimation, is 60 to 65%. The left ventricle has normal function. The left ventricle has no regional wall motion abnormalities. The left ventricular internal cavity size was normal in size. There is  no left ventricular hypertrophy. Left ventricular diastolic function could not be evaluated due to atrial  fibrillation. Left ventricular diastolic function could not be evaluated. Right Ventricle: The right ventricular size is normal. No increase in right ventricular wall thickness. Right ventricular systolic function is normal. There is normal pulmonary artery systolic pressure. The tricuspid regurgitant velocity is 2.13 m/s, and  with an assumed right atrial pressure of 8 mmHg, the estimated right ventricular systolic pressure is 44.0 mmHg. Left Atrium: Left atrial size was normal in size. Right Atrium: Right atrial size was normal in size. Pericardium: There is no evidence of pericardial effusion. Mitral Valve: The mitral valve is normal in structure. Mild mitral valve regurgitation. No evidence of mitral valve stenosis. Tricuspid Valve: The tricuspid valve is normal in structure. Tricuspid valve  regurgitation is not demonstrated. No evidence of tricuspid stenosis. Aortic Valve: The aortic valve is normal in structure. Aortic valve regurgitation is not visualized. No aortic stenosis is present. Pulmonic Valve: The pulmonic valve was normal in structure. Pulmonic valve regurgitation is trivial. No evidence of pulmonic stenosis. Aorta: The aortic root is normal in size and structure. Venous: The inferior vena cava is dilated in size with greater than 50% respiratory variability, suggesting right atrial pressure of 8 mmHg. IAS/Shunts: No atrial level shunt detected by color flow Doppler.  IVC IVC diam: 2.30 cm AORTIC VALVE LVOT Vmax:   74.35 cm/s LVOT Vmean:  46.550 cm/s LVOT VTI:    0.103 m TRICUSPID VALVE TR Peak grad:   18.1 mmHg TR Vmax:        213.00 cm/s  SHUNTS Systemic VTI: 0.10 m Fransico Him MD Electronically signed by Fransico Him MD Signature Date/Time: 04/30/2021/4:07:15 PM    Final          Scheduled Meds:  carvedilol  25 mg Oral BID WC   cholecalciferol  2,000 Units Oral Daily   diltiazem  60 mg Oral Q6H   ferrous sulfate  325 mg Oral Q breakfast   folic acid  1 mg Oral Daily   levETIRAcetam   500 mg Oral BID   multivitamin with minerals  1 tablet Oral Daily   pantoprazole  40 mg Oral QAC breakfast   potassium chloride  40 mEq Oral Once   potassium chloride  40 mEq Oral BID   thiamine  100 mg Oral Daily   Or   thiamine  100 mg Intravenous Daily   vitamin B-12  1,000 mcg Oral Daily   Continuous Infusions:     LOS: 4 days   Georgette Shell, MD  05/02/2021, 12:56 PM

## 2021-05-02 NOTE — Progress Notes (Signed)
05/02/21 1029am per RN Janeann Merl pt is very agitated and is in mittens and would not be able to hold still and understand the breathing commands for the mri study at this time will advise if pts condition changes

## 2021-05-02 NOTE — Progress Notes (Signed)
Progress Note  Patient Name: Sergio Mack Date of Encounter: 05/02/2021  [facility] Cardiologist: Thompson Grayer, MD   Subjective   Delirium continues Nurse indicates very little coherent interactions Rate control better with Cardizem   Inpatient Medications    Scheduled Meds:  carvedilol  25 mg Oral BID WC   cholecalciferol  2,000 Units Oral Daily   diltiazem  60 mg Oral Q6H   ferrous sulfate  325 mg Oral Q breakfast   folic acid  1 mg Oral Daily   levETIRAcetam  500 mg Oral BID   multivitamin with minerals  1 tablet Oral Daily   pantoprazole  40 mg Oral QAC breakfast   potassium chloride  40 mEq Oral Once   potassium chloride  40 mEq Oral BID   thiamine  100 mg Oral Daily   Or   thiamine  100 mg Intravenous Daily   vitamin B-12  1,000 mcg Oral Daily   Continuous Infusions:   PRN Meds: LORazepam **OR** LORazepam   Vital Signs    Vitals:   05/02/21 0238 05/02/21 0428 05/02/21 0659 05/02/21 0800  BP: 118/78 130/90 118/77 132/80  Pulse: 95 84 91   Resp: (!) 25 (!) 27 (!) 28   Temp: 98.3 F (36.8 C)  98.2 F (36.8 C)   TempSrc: Axillary  Axillary   SpO2: 97% 95% 93%   Weight:      Height:        Intake/Output Summary (Last 24 hours) at 05/02/2021 0843 Last data filed at 05/01/2021 2300 Gross per 24 hour  Intake 820 ml  Output 800 ml  Net 20 ml   Last 3 Weights 04/28/2021 02/03/2021 08/06/2020  Weight (lbs) 180 lb 192 lb 3.2 oz 174 lb 12.8 oz  Weight (kg) 81.647 kg 87.181 kg 79.289 kg      Telemetry    Afib rates 1110-150 bppm 05/02/2021   ECG    No new - Personally Reviewed  Physical Exam   GEN: No acute distress.  Delirium  Neck: No JVD Cardiac: RRR, no murmurs, rubs, or gallops.  Respiratory: Clear to auscultation bilaterally. GI: Soft, nontender, non-distended  MS: No edema; No deformity. Neuro:  Nonfocal sedated Psych: Normal affect  SKIN:  + bruising on arms   Labs    High Sensitivity Troponin:  No results for input(s):  TROPONINIHS in the last 720 hours.    Chemistry Recent Labs  Lab 04/30/21 0516 04/30/21 1102 04/30/21 1300 05/01/21 0525 05/02/21 0602  NA 138   < > 140 141 140  K 3.3*   < > 4.0 4.4 4.8  CL 103   < > 102 104 110  CO2 27   < > 29 28 23   GLUCOSE 104*   < > 120* 108* 113*  BUN 7*   < > 6* 7* 6*  CREATININE 0.71   < > 0.84 0.75 0.76  CALCIUM 8.0*   < > 8.2* 8.4* 8.5*  PROT 4.8*  --   --  5.5* 5.5*  ALBUMIN 2.7*  --   --  3.0* 2.9*  AST 61*  --   --  58* 46*  ALT 50*  --   --  51* 47*  ALKPHOS 45  --   --  52 50  BILITOT 1.4*  --   --  1.3* 1.3*  GFRNONAA >60   < > >60 >60 >60  ANIONGAP 8   < > 9 9 7    < > = values  in this interval not displayed.     Hematology Recent Labs  Lab 05/19/2021 1621 05/01/21 0717 05/02/21 0602  WBC 4.6 5.5 5.4  RBC 2.98* 3.16* 3.01*  HGB 9.1* 9.7* 9.1*  HCT 27.2* 29.3* 28.5*  MCV 91.3 92.7 94.7  MCH 30.5 30.7 30.2  MCHC 33.5 33.1 31.9  RDW 15.8* 15.7* 15.8*  PLT 52* 92* 127*    BNPNo results for input(s): BNP, PROBNP in the last 168 hours.   DDimer No results for input(s): DDIMER in the last 168 hours.   Radiology    ECHOCARDIOGRAM COMPLETE  Result Date: 04/30/2021    ECHOCARDIOGRAM REPORT   Patient Name:   Sergio Mack Date of Exam: 04/30/2021 Medical Rec #:  [id number]       Height:       73.0 in Accession #:    [id number]      Weight:       180.0 lb Date of Birth:  [birth year]       BSA:          2.057 m Patient Age:    75 years        BP:           118/68 mmHg Patient Gender: M               HR:           127 bpm. Exam Location:  Inpatient Procedure: 2D Echo, Cardiac Doppler and Color Doppler Indications:    Dyspnea R06.00  History:        Patient has prior history of Echocardiogram examinations, most                 recent 05/17/2018. Arrythmias:Atrial Fibrillation,                 Signs/Symptoms:Syncope; Risk Factors:Hypertension, Dyslipidemia                 and GERD.  Sonographer:    Jonelle Sidle Dance Referring Phys: [id number] HAO MENG   Sonographer Comments: Image acquisition challenging due to uncooperative patient. IMPRESSIONS  1. Left ventricular ejection fraction, by estimation, is 60 to 65%. The left ventricle has normal function. The left ventricle has no regional wall motion abnormalities. Left ventricular diastolic function could not be evaluated.  2. Right ventricular systolic function is normal. The right ventricular size is normal. There is normal pulmonary artery systolic pressure. The estimated right ventricular systolic pressure is 63.8 mmHg.  3. The mitral valve is normal in structure. Mild mitral valve regurgitation. No evidence of mitral stenosis.  4. The aortic valve is normal in structure. Aortic valve regurgitation is not visualized. No aortic stenosis is present.  5. The inferior vena cava is dilated in size with >50% respiratory variability, suggesting right atrial pressure of 8 mmHg. FINDINGS  Left Ventricle: Left ventricular ejection fraction, by estimation, is 60 to 65%. The left ventricle has normal function. The left ventricle has no regional wall motion abnormalities. The left ventricular internal cavity size was normal in size. There is  no left ventricular hypertrophy. Left ventricular diastolic function could not be evaluated due to atrial fibrillation. Left ventricular diastolic function could not be evaluated. Right Ventricle: The right ventricular size is normal. No increase in right ventricular wall thickness. Right ventricular systolic function is normal. There is normal pulmonary artery systolic pressure. The tricuspid regurgitant velocity is 2.13 m/s, and  with an assumed right atrial pressure of 8 mmHg, the estimated right ventricular systolic  pressure is 26.1 mmHg. Left Atrium: Left atrial size was normal in size. Right Atrium: Right atrial size was normal in size. Pericardium: There is no evidence of pericardial effusion. Mitral Valve: The mitral valve is normal in structure. Mild mitral valve regurgitation.  No evidence of mitral valve stenosis. Tricuspid Valve: The tricuspid valve is normal in structure. Tricuspid valve regurgitation is not demonstrated. No evidence of tricuspid stenosis. Aortic Valve: The aortic valve is normal in structure. Aortic valve regurgitation is not visualized. No aortic stenosis is present. Pulmonic Valve: The pulmonic valve was normal in structure. Pulmonic valve regurgitation is trivial. No evidence of pulmonic stenosis. Aorta: The aortic root is normal in size and structure. Venous: The inferior vena cava is dilated in size with greater than 50% respiratory variability, suggesting right atrial pressure of 8 mmHg. IAS/Shunts: No atrial level shunt detected by color flow Doppler.  IVC IVC diam: 2.30 cm AORTIC VALVE LVOT Vmax:   74.35 cm/s LVOT Vmean:  46.550 cm/s LVOT VTI:    0.103 m TRICUSPID VALVE TR Peak grad:   18.1 mmHg TR Vmax:        213.00 cm/s  SHUNTS Systemic VTI: 0.10 m Fransico Him MD Electronically signed by Fransico Him MD Signature Date/Time: 04/30/2021/4:07:15 PM    Final     Cardiac Studies   Echo scheduled for today  Patient Profile     75 y.o. male with a hx of PAF, RBBB, HTN, HLD, prediabetes and a history of alcohol withdrawal and now due to depression of son's death, has been drinking more and not eating.  Admitted with heme + stools, hypotension responded to fluids, and prolonged QTC.Marland Kitchen pt was on Eliquis and given andexxa to reverse..   Assessment & Plan   Afib:  see note from Dr Lovena Le Given ETOH abuse he is off anticoagulation and not a candidate for AAT/DCC. Tikosyn needs to be continued held. Rate control only option Cardizem added yesterday 60 mg q 6 and coreg dose increased 25 mg bid with improvement in rate control    Alcohol abuse             -Started in early May after learning his son's death.  Patient is the mourning             -Wife says he likely drank 5 and half gallons of vodka since early May.             -CIWA protocol.     3. GI  bleed:  Hct stable at 28.5 this am   Given his delerium, ETOH abuse, anemia not clear to me that he will be able to resume anticoagulation or Tikosyn in future   Dr Radford Pax covering cardiology service today     Jenkins Rouge MD [facility]  Patient ID: Drema Dallas, male   DOB: 1946/05/28, 75 y.o.   MRN: [id number]

## 2021-05-03 ENCOUNTER — Other Ambulatory Visit: Payer: Medicare Other

## 2021-05-03 ENCOUNTER — Inpatient Hospital Stay (HOSPITAL_COMMUNITY): Payer: Medicare Other

## 2021-05-03 DIAGNOSIS — Z7189 Other specified counseling: Secondary | ICD-10-CM

## 2021-05-03 DIAGNOSIS — Z515 Encounter for palliative care: Secondary | ICD-10-CM

## 2021-05-03 DIAGNOSIS — R531 Weakness: Secondary | ICD-10-CM

## 2021-05-03 LAB — BLOOD GAS, VENOUS
Acid-Base Excess: 1.2 mmol/L (ref 0.0–2.0)
Bicarbonate: 25.5 mmol/L (ref 20.0–28.0)
O2 Saturation: 39.2 %
Patient temperature: 98.6
pCO2, Ven: 41.8 mmHg — ABNORMAL LOW (ref 44.0–60.0)
pH, Ven: 7.403 (ref 7.250–7.430)
pO2, Ven: <31 mmHg — CL (ref 32.0–45.0)

## 2021-05-03 LAB — CBC
HCT: 32.7 % — ABNORMAL LOW (ref 39.0–52.0)
Hemoglobin: 10.3 g/dL — ABNORMAL LOW (ref 13.0–17.0)
MCH: 30.1 pg (ref 26.0–34.0)
MCHC: 31.5 g/dL (ref 30.0–36.0)
MCV: 95.6 fL (ref 80.0–100.0)
Platelets: 166 10*3/uL (ref 150–400)
RBC: 3.42 MIL/uL — ABNORMAL LOW (ref 4.22–5.81)
RDW: 15.9 % — ABNORMAL HIGH (ref 11.5–15.5)
WBC: 6.4 10*3/uL (ref 4.0–10.5)
nRBC: 0 % (ref 0.0–0.2)

## 2021-05-03 MED ORDER — MORPHINE SULFATE (PF) 2 MG/ML IV SOLN
2.0000 mg | Freq: Once | INTRAVENOUS | Status: AC
  Administered 2021-05-03: 2 mg via INTRAVENOUS
  Filled 2021-05-03: qty 1

## 2021-05-03 MED ORDER — HALOPERIDOL 0.5 MG PO TABS
0.5000 mg | ORAL_TABLET | ORAL | Status: DC | PRN
  Filled 2021-05-03: qty 1

## 2021-05-03 MED ORDER — MORPHINE SULFATE (PF) 2 MG/ML IV SOLN: 2.0000 mg | INTRAVENOUS | Status: DC | PRN

## 2021-05-03 MED ORDER — GLYCOPYRROLATE 1 MG PO TABS
1.0000 mg | ORAL_TABLET | ORAL | Status: DC | PRN
  Filled 2021-05-03: qty 1

## 2021-05-03 MED ORDER — HALOPERIDOL LACTATE 2 MG/ML PO CONC
0.5000 mg | ORAL | Status: DC | PRN
  Filled 2021-05-03: qty 0.3

## 2021-05-03 MED ORDER — ONDANSETRON HCL 4 MG/2ML IJ SOLN: 4.0000 mg | Freq: Four times a day (QID) | INTRAMUSCULAR | Status: DC | PRN

## 2021-05-03 MED ORDER — GLYCOPYRROLATE 0.2 MG/ML IJ SOLN
0.2000 mg | INTRAMUSCULAR | Status: DC | PRN
  Filled 2021-05-03: qty 1

## 2021-05-03 MED ORDER — ONDANSETRON 4 MG PO TBDP
4.0000 mg | ORAL_TABLET | Freq: Four times a day (QID) | ORAL | Status: DC | PRN
Start: 2021-05-03 — End: 2021-05-03

## 2021-05-03 MED ORDER — POLYVINYL ALCOHOL 1.4 % OP SOLN
1.0000 [drp] | Freq: Four times a day (QID) | OPHTHALMIC | Status: DC | PRN
  Filled 2021-05-03: qty 15

## 2021-05-03 MED ORDER — METOPROLOL TARTRATE 5 MG/5ML IV SOLN
5.0000 mg | Freq: Once | INTRAVENOUS | Status: AC
  Administered 2021-05-03: 5 mg via INTRAVENOUS
  Filled 2021-05-03: qty 5

## 2021-05-03 MED ORDER — METOPROLOL TARTRATE 5 MG/5ML IV SOLN
2.5000 mg | Freq: Once | INTRAVENOUS | Status: AC
  Administered 2021-05-03: 2.5 mg via INTRAVENOUS
  Filled 2021-05-03: qty 5

## 2021-05-03 MED ORDER — ACETAMINOPHEN 325 MG PO TABS: 650.0000 mg | ORAL_TABLET | Freq: Four times a day (QID) | ORAL | Status: DC | PRN

## 2021-05-03 MED ORDER — MORPHINE SULFATE (PF) 4 MG/ML IV SOLN
4.0000 mg | Freq: Once | INTRAVENOUS | Status: AC
Start: 2021-05-03 — End: 2021-05-03
  Administered 2021-05-03: 4 mg via INTRAVENOUS
  Filled 2021-05-03: qty 1

## 2021-05-03 MED ORDER — ACETAMINOPHEN 650 MG RE SUPP: 650.0000 mg | Freq: Four times a day (QID) | RECTAL | Status: DC | PRN

## 2021-05-03 MED ORDER — BIOTENE DRY MOUTH MT LIQD: 15.0000 mL | OROMUCOSAL | Status: DC | PRN

## 2021-05-03 MED ORDER — FUROSEMIDE 10 MG/ML IJ SOLN
40.0000 mg | Freq: Once | INTRAMUSCULAR | Status: AC
  Administered 2021-05-03: 40 mg via INTRAVENOUS
  Filled 2021-05-03: qty 4

## 2021-05-03 MED ORDER — MORPHINE 100MG IN NS 100ML (1MG/ML) PREMIX INFUSION
3.0000 mg/h | INTRAVENOUS | Status: DC
Start: 2021-05-03 — End: 2021-05-03
  Administered 2021-05-03: 1 mg/h via INTRAVENOUS
  Filled 2021-05-03: qty 100

## 2021-05-03 MED ORDER — MORPHINE SULFATE (PF) 2 MG/ML IV SOLN: 1.0000 mg | INTRAVENOUS | Status: DC | PRN

## 2021-05-03 MED ORDER — HALOPERIDOL LACTATE 5 MG/ML IJ SOLN: 0.5000 mg | INTRAMUSCULAR | Status: DC | PRN

## 2021-05-03 MED ORDER — GLYCOPYRROLATE 0.2 MG/ML IJ SOLN
0.2000 mg | INTRAMUSCULAR | Status: DC | PRN
  Administered 2021-05-03: 0.2 mg via INTRAVENOUS
  Filled 2021-05-03 (×4): qty 1

## 2021-05-06 ENCOUNTER — Ambulatory Visit (HOSPITAL_COMMUNITY): Payer: Medicare Other | Admitting: Nurse Practitioner

## 2021-05-06 ENCOUNTER — Ambulatory Visit: Payer: Medicare Other | Admitting: Internal Medicine

## 2021-05-21 NOTE — Progress Notes (Signed)
Pt sleeping and unresponsive. Pt's wife was at bedside she request to have the chaplain stop by. She said they have been married for over 8 years. They loss they son recently to a heart attack. She said the pt has not been doing well since. The chaplain offered caring and supportive presence and listening ear. He offered prayers and blessings.
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
## 2021-05-21 NOTE — Death Summary Note (Signed)
Death Summary  Sergio Mack XMI:[id number] DOB: [birth year] DOA: 04/28/2021  PCP: Sergio Rail, MD  Admit date: 2021/04/28 Date of Death: 2021/05/04 Time of Death: 11/11/23 pm Notification: Sergio Rail, MD notified of death of 2021/05/04   History of present illness:  Sergio Mack is a 75 y.o. male with a history of alcohol abuse, alcohol withdrawal seizures, atrial fibrillation, hypertension, diastolic heart failure admitted with GI bleed Hemoccult positive.  Alcohol level 211 on admission.  He was on Keppra and Eliquis at home.  He was seen in consultation by palliative care and cardiology.  He was hypotensive on admission.  He was treated with IV fluids, CIWA protocol, IV Cardizem, IV Keppra.  He was dehydrated hypotensive with lactic acidosis on admission.  He also had a prolonged QT C on admission.  He was treated with high-dose thiamine for possible Wernicke's encephalopathy. CT of the abdomen was concerning for pancreatic mass.  MRI of the brain and MRI of the abdomen was ordered but unable to complete as patient was not able to follow commands or lay still.  He became more delirious and confused.  CT head showed no acute abnormality. His wife Sergio Mack was his healthcare power of attorney.  He was made comfort care on the day he passed.  He was also a DNR. Per his wife his grief was too much for him to handle the loss of his son 2 weeks prior to admission to the hospital, and he started drinking heavily. Final Diagnoses:  1.  Wernicke's encephalopathy 2 chronic atrial fibrillation 3 seizures 4 GI bleed 5 acute kidney injury 6 thrombocytopenia 7 alcohol abuse 8 prolonged QTC 9 DNR/DNI/comfort care   The results of significant diagnostics from this hospitalization (including imaging, microbiology, ancillary and laboratory) are listed below for reference.    Significant Diagnostic Studies: DG Chest 1 View  Result Date: 05/02/2021 CLINICAL DATA:  Concern for pneumo mediastinum.  History of alcohol abuse. Former smoker. EXAM: CHEST  1 VIEW COMPARISON:  04-28-21 FINDINGS: Heart size and pulmonary vascularity are normal for technique. Shallow inspiration. Small left pleural effusion with basilar atelectasis or infiltration. No evidence of pneumothorax or pneumomediastinum. Mediastinal contours appear intact. Calcification of the aorta. IMPRESSION: Small left pleural effusion with consolidation or atelectasis in the left lung base. Electronically Signed   By: Lucienne Capers M.D.   On: 05/02/2021 18:01   DG Abd 1 View  Result Date: 05/02/2021 CLINICAL DATA:  Abdominal pain. EXAM: ABDOMEN - 1 VIEW COMPARISON:  None. FINDINGS: Two portable supine views. Upper normal mid abdominal small bowel loops at up to 3.2 cm. No gross free intraperitoneal air. Relative paucity of colonic gas. Left abdominal hyperattenuation of 7 mm could represent a renal stone. IMPRESSION: Upper normal small bowel caliber, nonspecific. Cannot exclude mild adynamic ileus versus less likely low-grade partial small bowel obstruction. Possible left nephrolithiasis. Electronically Signed   By: Abigail Miyamoto M.D.   On: 05/02/2021 13:33   CT HEAD WO CONTRAST  Result Date: 05/02/2021 CLINICAL DATA:  Fall EXAM: CT HEAD WITHOUT CONTRAST TECHNIQUE: Contiguous axial images were obtained from the base of the skull through the vertex without intravenous contrast. COMPARISON:  Mar 29, 2020 FINDINGS: Brain: No evidence of acute infarction, hemorrhage, hydrocephalus, extra-axial collection or mass lesion/mass effect. Periventricular white matter hypodensities consistent with sequela of chronic microvascular ischemic disease. Global parenchymal volume loss, advanced for age. Vascular: Vascular calcifications. Skull: Normal. Negative for fracture or focal lesion. Sinuses/Orbits: No acute finding. Other:  There is soft tissue air within the RIGHT masticator space with several locules of air within the along the skull base, likely  within venous distribution. IMPRESSION: 1.  No acute intracranial abnormality. 2. There is soft tissue air within the RIGHT masticator space, likely within a venous plexus as well as several other venous structures. This is most likely iatrogenic in etiology. Consider dedicated chest radiograph to exclude pneumomediastinum given history of recent EGD Electronically Signed   By: Valentino Saxon MD   On: 05/02/2021 17:01   US Abdomen Complete  Result Date: 05/02/2021 CLINICAL DATA:  Abnormal liver function. Alcohol abuse. Thrombocytopenia. EXAM: ABDOMEN ULTRASOUND COMPLETE COMPARISON:  No recent prior. FINDINGS: Gallbladder: 1.5 cm gallstone noted. Ring down artifact noted suggesting adenomyomatosis. Gallbladder wall is slightly thickened at 4 mm. Cholecystitis cannot be excluded. Negative Murphy sign. Common bile duct: Diameter: 6 mm Liver: Increased echogenicity consistent fatty infiltration or hepatocellular disease. Portal vein is patent on color Doppler imaging with normal direction of blood flow towards the liver. IVC: No abnormality visualized. Pancreas: Hypoechoic foci within the pancreas cannot be excluded. To exclude pancreatic mass MRI should be considered. Spleen: Size and appearance within normal limits. Right Kidney: Length: 11.2 cm. Echogenicity within normal limits. Two tiny simple cysts, the largest measures 0.9 cm. No hydronephrosis visualized. Left Kidney: Length: 12.9 cm. Echogenicity within normal limits. 1.1 cm simple cyst. Moderate hydronephrosis visualized. Bladder is nondistended.  Bilateral ureteral jets noted. Abdominal aorta: Mild ectasia of the abdominal aorta 3.1 cm. Other findings: Mild ascites. This was a technically difficult exam as the patient was uncooperative. IMPRESSION: 1. Technically difficult exam as the patient was uncooperative. A 1.5 cm gallstone is noted. Adenomyomatosis of the gallbladder cannot be excluded. Gallbladder wall is slightly thickened at 4 mm.  Cholecystitis cannot be excluded. Negative Murphy sign. No biliary distention. 2. Increased echogenicity of the liver consistent with fatty infiltration or hepatocellular disease. 3. Questionable hypoechoic foci within the pancreas. To exclude pancreatic mass abdominal MRI should be considered. 4. Moderate left hydronephrosis. Tiny simple cysts both kidneys. Bladder is nondistended. Bilateral ureteral jets noted. 5. Mild ectasia of the abdominal aorta to 3.1 cm. Recommend follow up by [place] in 3years. This recommendation follows ACR consensus guidelines: White Paper of the ACR Incidental Findings Committee II on Vascular Findings. Joellyn Rued [id number]; 02:409-735. Electronically Signed   By: Marcello Moores  Register   On: 04/22/2021 08:46   DG CHEST PORT 1 VIEW  Result Date: 05-29-21 CLINICAL DATA:  Dyspnea EXAM: PORTABLE CHEST 1 VIEW COMPARISON:  05/02/2021 FINDINGS: Shallow inspiration. Mild cardiac enlargement. No vascular congestion. Small left pleural effusion with infiltration or atelectasis in the left lung base. Appearance is similar to previous study and may indicate pneumonia. Calcification of the aorta. No pneumothorax. IMPRESSION: Small left pleural effusion with infiltration or atelectasis in the left lung base. No significant change. Electronically Signed   By: Lucienne Capers M.D.   On: 2021-05-29 01:48   DG Chest Port 1 View  Result Date: 05/09/2021 CLINICAL DATA:  Possible sepsis.  Alcohol abuse.  Hypotension. EXAM: PORTABLE CHEST 1 VIEW COMPARISON:  04/18/2020 FINDINGS: Interval patient rotation to the left. No gross cardiac enlargement. Stable mild pleural and parenchymal scarring at the left lung base. Minimal linear atelectasis or scarring at the right lung base. Thoracic spine degenerative changes. IMPRESSION: No acute abnormality. Electronically Signed   By: Claudie Revering M.D.   On: 04/26/2021 16:28   ECHOCARDIOGRAM COMPLETE  Result Date: 04/30/2021    ECHOCARDIOGRAM  REPORT   Patient Name:    ARLENE GENOVA Date of Exam: 04/30/2021 Medical Rec #:  [id number]       Height:       73.0 in Accession #:    [id number]      Weight:       180.0 lb Date of Birth:  1946/10/03       BSA:          2.057 m Patient Age:    67 years        BP:           118/68 mmHg Patient Gender: M               HR:           127 bpm. Exam Location:  Inpatient Procedure: 2D Echo, Cardiac Doppler and Color Doppler Indications:    Dyspnea R06.00  History:        Patient has prior history of Echocardiogram examinations, most                 recent 05/17/2018. Arrythmias:Atrial Fibrillation,                 Signs/Symptoms:Syncope; Risk Factors:Hypertension, Dyslipidemia                 and GERD.  Sonographer:    Jonelle Sidle Dance Referring Phys: [id number] HAO MENG  Sonographer Comments: Image acquisition challenging due to uncooperative patient. IMPRESSIONS  1. Left ventricular ejection fraction, by estimation, is 60 to 65%. The left ventricle has normal function. The left ventricle has no regional wall motion abnormalities. Left ventricular diastolic function could not be evaluated.  2. Right ventricular systolic function is normal. The right ventricular size is normal. There is normal pulmonary artery systolic pressure. The estimated right ventricular systolic pressure is 01.0 mmHg.  3. The mitral valve is normal in structure. Mild mitral valve regurgitation. No evidence of mitral stenosis.  4. The aortic valve is normal in structure. Aortic valve regurgitation is not visualized. No aortic stenosis is present.  5. The inferior vena cava is dilated in size with >50% respiratory variability, suggesting right atrial pressure of 8 mmHg. FINDINGS  Left Ventricle: Left ventricular ejection fraction, by estimation, is 60 to 65%. The left ventricle has normal function. The left ventricle has no regional wall motion abnormalities. The left ventricular internal cavity size was normal in size. There is  no left ventricular hypertrophy. Left ventricular  diastolic function could not be evaluated due to atrial fibrillation. Left ventricular diastolic function could not be evaluated. Right Ventricle: The right ventricular size is normal. No increase in right ventricular wall thickness. Right ventricular systolic function is normal. There is normal pulmonary artery systolic pressure. The tricuspid regurgitant velocity is 2.13 m/s, and  with an assumed right atrial pressure of 8 mmHg, the estimated right ventricular systolic pressure is 93.2 mmHg. Left Atrium: Left atrial size was normal in size. Right Atrium: Right atrial size was normal in size. Pericardium: There is no evidence of pericardial effusion. Mitral Valve: The mitral valve is normal in structure. Mild mitral valve regurgitation. No evidence of mitral valve stenosis. Tricuspid Valve: The tricuspid valve is normal in structure. Tricuspid valve regurgitation is not demonstrated. No evidence of tricuspid stenosis. Aortic Valve: The aortic valve is normal in structure. Aortic valve regurgitation is not visualized. No aortic stenosis is present. Pulmonic Valve: The pulmonic valve was normal in structure. Pulmonic valve regurgitation is trivial. No evidence of pulmonic  stenosis. Aorta: The aortic root is normal in size and structure. Venous: The inferior vena cava is dilated in size with greater than 50% respiratory variability, suggesting right atrial pressure of 8 mmHg. IAS/Shunts: No atrial level shunt detected by color flow Doppler.  IVC IVC diam: 2.30 cm AORTIC VALVE LVOT Vmax:   74.35 cm/s LVOT Vmean:  46.550 cm/s LVOT VTI:    0.103 m TRICUSPID VALVE TR Peak grad:   18.1 mmHg TR Vmax:        213.00 cm/s  SHUNTS Systemic VTI: 0.10 m Fransico Him MD Electronically signed by Fransico Him MD Signature Date/Time: 04/30/2021/4:07:15 PM    Final     Microbiology: Recent Results (from the past 240 hour(s))  Resp Panel by RT-PCR (Flu A&B, Covid) Nasopharyngeal Swab     Status: None   Collection Time: 05/20/2021   4:05 PM   Specimen: Nasopharyngeal Swab; Nasopharyngeal(NP) swabs in vial transport medium  Result Value Ref Range Status   SARS Coronavirus 2 by RT PCR NEGATIVE NEGATIVE Final    Comment: (NOTE) SARS-CoV-2 target nucleic acids are NOT DETECTED.  The SARS-CoV-2 RNA is generally detectable in upper respiratory specimens during the acute phase of infection. The lowest concentration of SARS-CoV-2 viral copies this assay can detect is 138 copies/mL. A negative result does not preclude SARS-Cov-2 infection and should not be used as the sole basis for treatment or other patient management decisions. A negative result may occur with  improper specimen collection/handling, submission of specimen other than nasopharyngeal swab, presence of viral mutation(s) within the areas targeted by this assay, and inadequate number of viral copies(<138 copies/mL). A negative result must be combined with clinical observations, patient history, and epidemiological information. The expected result is Negative.  Fact Sheet for Patients:  EntrepreneurPulse.com.au  Fact Sheet for Healthcare Providers:  IncredibleEmployment.be  This test is no t yet approved or cleared by the [place] FDA and  has been authorized for detection and/or diagnosis of SARS-CoV-2 by FDA under an Emergency Use Authorization (EUA). This EUA will remain  in effect (meaning this test can be used) for the duration of the COVID-19 declaration under Section 564(b)(1) of the Act, 21 U.S.C.section 360bbb-3(b)(1), unless the authorization is terminated  or revoked sooner.       Influenza A by PCR NEGATIVE NEGATIVE Final   Influenza B by PCR NEGATIVE NEGATIVE Final    Comment: (NOTE) The Xpert Xpress SARS-CoV-2/FLU/RSV plus assay is intended as an aid in the diagnosis of influenza from Nasopharyngeal swab specimens and should not be used as a sole basis for treatment. Nasal washings and aspirates  are unacceptable for Xpert Xpress SARS-CoV-2/FLU/RSV testing.  Fact Sheet for Patients: EntrepreneurPulse.com.au  Fact Sheet for Healthcare Providers: IncredibleEmployment.be  This test is not yet approved or cleared by the [place] FDA and has been authorized for detection and/or diagnosis of SARS-CoV-2 by FDA under an Emergency Use Authorization (EUA). This EUA will remain in effect (meaning this test can be used) for the duration of the COVID-19 declaration under Section 564(b)(1) of the Act, 21 U.S.C. section 360bbb-3(b)(1), unless the authorization is terminated or revoked.  Performed at [facility], [place] [address], [place], [place] [address]   Blood Culture (routine x 2)     Status: None   Collection Time: 05/05/2021  4:40 PM   Specimen: BLOOD  Result Value Ref Range Status   Specimen Description   Final    BLOOD LEFT ANTECUBITAL Performed at [facility],  [place] [address] [place], [place] [address]    Special Requests   Final    BOTTLES DRAWN AEROBIC AND ANAEROBIC Blood Culture adequate volume Performed at [facility] [address], [place], [place] [address]    Culture   Final    NO GROWTH 5 DAYS Performed at [facility], [place] [address], [place], [place] [address]    Report Status 05/02/2021 FINAL  Final  Blood Culture (routine x 2)     Status: None   Collection Time: 04/23/2021  4:40 PM   Specimen: BLOOD  Result Value Ref Range Status   Specimen Description   Final    BLOOD RIGHT ANTECUBITAL Performed at [facility] [address], [place], [place] [address]    Special Requests   Final    BOTTLES DRAWN AEROBIC AND ANAEROBIC Blood Culture adequate volume Performed at [place] [address], [place], [place] [address]    Culture   Final    NO GROWTH 5 DAYS Performed at [facility], [place] [address]  [place], [place] [address]    Report Status 05/02/2021 FINAL  Final  Urine culture     Status: Abnormal   Collection Time: 05/01/2021  8:30 PM   Specimen: In/Out Cath Urine  Result Value Ref Range Status   Specimen Description   Final    IN/OUT CATH URINE Performed at [place] [address], [place], [place] [address]    Special Requests   Final    NONE Performed at [facility], [place] [address], [place], [place] [address]    Culture MULTIPLE SPECIES PRESENT, SUGGEST RECOLLECTION (A)  Final   Report Status 04/30/2021 FINAL  Final     Labs: Basic Metabolic Panel: Recent Labs  Lab 04/22/2021 0515 04/30/21 0516 04/30/21 1102 04/30/21 1300 05/01/21 0525 05/02/21 0602  NA 137 138 140 140 141 140  K 3.1* 3.3* 3.7 4.0 4.4 4.8  CL 103 103 102 102 104 110  CO2 26 27 28 29 28 23   GLUCOSE 109* 104* 134* 120* 108* 113*  BUN 15 7* 7* 6* 7* 6*  CREATININE 1.13 0.71 0.71 0.84 0.75 0.76  CALCIUM 8.2* 8.0* 8.1* 8.2* 8.4* 8.5*  MG 1.8 1.6*  --  2.6* 2.1 1.7   Liver Function Tests: Recent Labs  Lab 05/16/2021 1605 05/18/2021 0515 04/30/21 0516 05/01/21 0525 05/02/21 0602  AST 146* 63* 61* 58* 46*  ALT 91* 57* 50* 51* 47*  ALKPHOS 51 48 45 52 50  BILITOT 1.1 1.5* 1.4* 1.3* 1.3*  PROT 5.7* 5.0* 4.8* 5.5* 5.5*  ALBUMIN 3.3* 2.8* 2.7* 3.0* 2.9*   No results for input(s): LIPASE, AMYLASE in the last 168 hours. Recent Labs  Lab 05/02/21 1002  AMMONIA 18   CBC: Recent Labs  Lab 05/06/2021 1605 05/15/2021 1701 05/07/2021 0744 05/06/2021 1621 05/01/21 0717 05/02/21 0602 05/21/21 0240  WBC 9.2   < > 5.5 4.6 5.5 5.4 6.4  NEUTROABS 6.4  --   --   --   --   --   --   HGB 11.4*   < > 8.9* 9.1* 9.7* 9.1* 10.3*  HCT 35.1*   < > 26.4* 27.2* 29.3* 28.5* 32.7*  MCV 93.1   < > 91.3 91.3 92.7 94.7 95.6  PLT 70*   < > 48* 52* 92* 127* 166   < > = values in this interval not displayed.   Cardiac Enzymes:  No results for input(s): CKTOTAL, CKMB, CKMBINDEX,  TROPONINI in the last 168 hours. D-Dimer No results for input(s): DDIMER in the last 72 hours. BNP: Invalid input(s): POCBNP CBG: No results for input(s): GLUCAP in the last 168 hours. Anemia work up Recent Labs    05/02/21 1002  VITAMINB12 2,277*  FOLATE 34.1   Urinalysis    Component Value Date/Time   COLORURINE YELLOW 05/10/2021 2030   APPEARANCEUR HAZY (A) 05/06/2021 2030   APPEARANCEUR Clear 06/26/2018 1101   LABSPEC 1.012 05/06/2021 2030   PHURINE 6.0 05/02/2021 2030   GLUCOSEU NEGATIVE 04/30/2021 2030   GLUCOSEU NEGATIVE 05/06/2020 1409   HGBUR SMALL (A) 04/28/2021 2030   BILIRUBINUR NEGATIVE 05/12/2021 2030   BILIRUBINUR Negative 06/26/2018 1101   KETONESUR 20 (A) 05/13/2021 2030   PROTEINUR 30 (A) 05/02/2021 2030   UROBILINOGEN 0.2 05/06/2020 1409   NITRITE NEGATIVE 05/05/2021 2030   LEUKOCYTESUR TRACE (A) 04/30/2021 2030   Sepsis Labs Invalid input(s): PROCALCITONIN,  WBC,  LACTICIDVEN     SIGNED:  Georgette Shell, MD  [facility] 05-22-21, 4:55 PM

## 2021-05-21 NOTE — Progress Notes (Signed)
   Chart reviewed, decline overnight noted, now with end-of-life orders. Discussed with Dr. Rodena Piety - she has made patient comfort care and does not feel cardiology needs to see. Please call with questions.  Jah Alarid PA-C

## 2021-05-21 NOTE — Consult Note (Signed)
Consultation Note Date: 06/01/2021   Patient Name: Sergio Mack  DOB: 1946-06-26  MRN: [id number]  Age / Sex: 75 y.o., male  PCP: Binnie Rail, MD Referring Physician: Georgette Shell, MD  Reason for Consultation: Terminal Care  HPI/Patient Profile: 75 y.o. male     admitted on 05/19/2021 with  .   Clinical Assessment and Goals of Care: 75 year old gentleman with a past medical history significant for atrial fibrillation, hypertension, diastolic heart failure, history of alcohol withdrawal seizures.  Patient was brought into the hospital because of concerns of heavy alcohol use.  Patient is a recovered alcohol user however started drinking 2 weeks prior to this admission due to sudden unexpected death of his son.  His son was found dead in his bed due to cardiac arrest/MI by his 33 year old son.  Patient started drinking a gallon of vodka every 2 days.  He has not had anything to eat for 2 weeks prior to this hospitalization.  Patient was admitted with concerns of GI bleed as well.  In a.m. on 01-Jun-2021, after a period of marked progressive decline for the last 12-14 hours, goals of care shifted to comfort measures.  Patient was seen emergently palliative consultation.  Patient appears to be actively dying, he is unresponsive resting in bed with eyes closed, has rhonchorous breath sounds and coarse increased work of breathing.  Wife is present at bedside.  Introduced myself and palliative care as follows: Palliative medicine is specialized medical care for people living with serious illness. It focuses on providing relief from the symptoms and stress of a serious illness. The goal is to improve quality of life for both the patient and the family. Goals of care: Broad aims of medical therapy in relation to the patient's values and preferences. Our aim is to provide medical care aimed at enabling patients to  achieve the goals that matter most to them, given the circumstances of their particular medical situation and their constraints.   Provided active listening and supportive care.  Discussed with wife Sergio Mack who is present at the bedside about end-of-life signs and symptoms.  She states that her grandson who is 44 years old has recently enrolled in grief counseling because of the sudden death of his father.  She talks about her 2 sisters coming to see her from other states very soon.  She states that her 2 sisters are her support system.  Spiritual care has also been consulted and patient and wife are getting excellent compassionate care from nursing staff on the fourth floor.  Anticipate hospital death, see below  Trooper Wife.  Only son died recently  SUMMARY OF RECOMMENDATIONS   DNR/DNI Full scope of comfort measures Anticipated hospital death: Prognosis Hours to some very limited number of days, possibly 1-2 days in my opinion, continue all aspects of comfort care for patient and wife Appreciate chaplain consult Medication history noted.  Patient with active symptoms of nonverbal gestures, distress.  Will increase rate of morphine infusion to 3 mg an hour.  Provision also for bolus dosing on an as-needed basis for signs and symptoms related to end-of-life.  Discussed with wife at bedside about terminal care.  She is in full agreement for providing comfort measures and is aware of anticipated hospital death. Thank you for the consult.  Code Status/Advance Care Planning: DNR   Symptom Management:     Palliative Prophylaxis:  Delirium Protocol  Additional Recommendations (Limitations, Scope, Preferences): Full Comfort Care  Psycho-social/Spiritual:  Desire for further Chaplaincy support:yes Additional Recommendations: Education on Hospice  Prognosis:  Hours - Days  Discharge Planning: Anticipated Hospital Death      Primary Diagnoses: Present on Admission:  Hypotension  HFrEF  (heart failure with reduced ejection fraction) ([place])  Persistent atrial fibrillation ([place])  Alcoholism ([place])   I have reviewed the medical record, interviewed the patient and family, and examined the patient. The following aspects are pertinent.  Past Medical History:  Diagnosis Date   Diverticulosis 2006   Erectile dysfunction    GERD (gastroesophageal reflux disease)    History of skin cancer    History of syncope 1994   no etiology; no recurrence   Hyperlipidemia    LDL goal = < 100, ideally < 70   Hypertension    Persistent atrial fibrillation ([place]) 03/06/2014   PVC's (premature ventricular contractions)    Rotator cuff tear    Visit for monitoring Tikosyn therapy 12/19/2017   Social History   Socioeconomic History   Marital status: Married    Spouse name: Not on file   Number of children: 1   Years of education: Not on file   Highest education level: High school graduate  Occupational History   Not on file  Tobacco Use   Smoking status: Former    Packs/day: 1.00    Pack years: 0.00    Types: Cigarettes    Quit date: 1990    Years since quitting: 32.4   Smokeless tobacco: Former    Types: Chew    Quit date: 1990   Tobacco comments:    smoked [place] with 12 year abstinence ( 21 total years of smoking), up to 1 ppd  Vaping Use   Vaping Use: Never used  Substance and Sexual Activity   Alcohol use: Yes    Comment: 3 bottles of liquor a day   Drug use: Never   Sexual activity: Not on file  Other Topics Concern   Not on file  Social History Narrative   Lives at home with his wife   Works in his garden and walks 4 days per week   Right handed   Caffeine: minimal   Social Determinants of Radio broadcast assistant Strain: Not on file  Food Insecurity: Not on file  Transportation Needs: Not on file  Physical Activity: Not on file  Stress: Not on file  Social Connections: Not on file   Family History  Problem Relation Age of Onset   Heart attack  Father 65   Diabetes Mother    Transient ischemic attack Mother 21   Diabetes Sister    Heart attack Sister 58   Stomach cancer Maternal Grandfather    Cirrhosis Paternal Uncle        alcoholic   Alzheimer's disease Brother    Colon cancer Neg Hx    Colon polyps Neg Hx    Scheduled Meds:  Chlorhexidine Gluconate Cloth  6 each Topical Daily   multivitamin with minerals  1 tablet Oral Daily   [START ON  05/10/2021] thiamine injection  100 mg Intravenous Daily   Continuous Infusions:  levETIRAcetam 500 mg (2021-05-30 8101)   morphine 1 mg/hr (May 30, 2021 1016)   thiamine injection 500 mg (05/30/2021 0533)   Followed by   Derrill Memo ON 05/04/2021] thiamine injection     PRN Meds:.acetaminophen **OR** acetaminophen, antiseptic oral rinse, glycopyrrolate **OR** glycopyrrolate **OR** glycopyrrolate, haloperidol **OR** haloperidol **OR** haloperidol lactate, LORazepam **OR** LORazepam, morphine injection, ondansetron **OR** ondansetron (ZOFRAN) IV, polyvinyl alcohol Medications Prior to Admission:  Prior to Admission medications   Medication Sig Start Date End Date Taking? Authorizing Provider  acetaminophen (TYLENOL) 500 MG tablet Take 500 mg by mouth every 6 (six) hours as needed for mild pain, fever or headache.   Yes [provider]  amLODipine (NORVASC) 2.5 MG tablet TAKE 1 TABLET EVERY DAY Patient taking differently: Take 2.5 mg by mouth daily. 03/09/21  Yes Sherran Needs, NP  Cholecalciferol (D3) 50 MCG (2000 UT) TABS Take 2,000 Units by mouth daily.   Yes [provider]  dofetilide (TIKOSYN) 500 MCG capsule TAKE 1 CAPSULE TWICE DAILY Patient taking differently: Take 500 mcg by mouth 2 (two) times daily. 04/14/21  Yes Sherran Needs, NP  ELIQUIS 5 MG TABS tablet TAKE 1 TABLET TWICE DAILY Patient taking differently: Take 5 mg by mouth 2 (two) times daily. 08/26/20  Yes Sherran Needs, NP  ferrous sulfate 325 (65 FE) MG tablet Take 325 mg by mouth daily with breakfast.   Yes  [provider]  folic acid (FOLVITE) 1 MG tablet TAKE 1 TABLET EVERY DAY Patient taking differently: Take 1 mg by mouth daily. 01/21/21  Yes Burns, Claudina Lick, MD  furosemide (LASIX) 20 MG tablet TAKE 1 TABLET EVERY DAY Patient taking differently: Take 20 mg by mouth daily. 01/13/21  Yes Sherran Needs, NP  GNP VITAMIN B-1 100 MG tablet TAKE 1 TABLET EVERY DAY Patient taking differently: Take 100 mg by mouth daily. 01/21/21  Yes Burns, Claudina Lick, MD  levETIRAcetam (KEPPRA) 500 MG tablet Take 1 tablet (500 mg total) by mouth 2 (two) times daily. 08/03/20  Yes Burns, Claudina Lick, MD  magnesium oxide (MAG-OX) 400 MG tablet Take 1 tablet (400 mg total) by mouth daily. 05/15/19  Yes Burns, Claudina Lick, MD  metoprolol tartrate (LOPRESSOR) 100 MG tablet TAKE 1 TABLET (100 MG TOTAL) BY MOUTH 2 (TWO) TIMES DAILY. 03/04/21  Yes Sherran Needs, NP  Multiple Vitamins-Minerals (ICAPS AREDS 2 PO) Take 1 capsule by mouth 2 (two) times daily.   Yes [provider]  quinapril (ACCUPRIL) 40 MG tablet TAKE 1 TABLET (40 MG TOTAL) BY MOUTH EVERY MORNING. 09/17/20  Yes Sherran Needs, NP  sodium chloride (OCEAN) 0.65 % SOLN nasal spray Place 1 spray into both nostrils as needed for congestion.   Yes [provider]  traZODone (DESYREL) 100 MG tablet Take 1 tablet (100 mg total) by mouth at bedtime. Patient taking differently: Take 100 mg by mouth at bedtime as needed for sleep. 12/10/20  Yes Burns, Claudina Lick, MD  vitamin B-12 (CYANOCOBALAMIN) 1000 MCG tablet TAKE 1 TABLET EVERY DAY Patient taking differently: Take 1,000 mcg by mouth daily. 01/21/21  Yes Burns, Claudina Lick, MD  zinc gluconate 50 MG tablet Take 50 mg by mouth daily as needed (cuts/bruises).   Yes [provider]  cholecalciferol (VITAMIN D) 25 MCG (1000 UNIT) tablet TAKE 1 TABLET EVERY DAY Patient not taking: Reported on 05/10/2021 01/21/21   Binnie Rail, MD   Allergies  Allergen Reactions   Glucosamine Itching and Other (See Comments)     Itching feet and palms, tightness in chest also   Shellfish-Derived Products Anaphylaxis, Shortness Of Breath and Itching    Itching feet and palms, tightness in chest also  Can eat oysters, can do dyes for tests and can eat just a few shrimp   Review of Systems Non verbal  Physical Exam Patient is not awake, not alert Unresponsive Actively dying Has coarse rhonchorous breath sounds Has noisy breathing Is using neck and abdominal muscles paradoxical respiration noted Has trace edema S1-S2 Abdomen is not distended  Vital Signs: BP (!) 133/98 (BP Location: Right Arm)   Pulse (!) 154   Temp (!) 101.4 F (38.6 C) (Axillary)   Resp (!) 38   Ht 6\' 1"  (1.854 m)   Wt 81.6 kg   SpO2 93%   BMI 23.75 kg/m  Pain Scale: PAINAD   Pain Score: 0-No pain   SpO2: SpO2: 93 % O2 Device:SpO2: 93 % O2 Flow Rate: .O2 Flow Rate (L/min): 3 L/min  IO: Intake/output summary:  Intake/Output Summary (Last 24 hours) at 2021-05-24 1113 Last data filed at 05/24/2021 3545 Gross per 24 hour  Intake 150 ml  Output 4700 ml  Net -4550 ml    LBM: Last BM Date: 05/02/21 Baseline Weight: Weight: 81.6 kg Most recent weight: Weight: 81.6 kg     Palliative Assessment/Data:   PPS 10%  Time In:  10 Time Out:  11 Time Total:  60  Greater than 50%  of this time was spent counseling and coordinating care related to the above assessment and plan.  Signed by: Loistine Chance, MD   Please contact Palliative Medicine Team phone at [phone] for questions and concerns.  For individual provider: See Shea Evans

## 2021-05-21 NOTE — Progress Notes (Signed)
Patient unable to take PO Cardizem and potassium last night.  Patient could not follow directions and although he responded to his name, he would not stay alert enough to swallow meds.  He had a very congested, wet cough and needed oral suctioning several times. Patient triggered red mews.  Rapid response and provider notified.  Patient had a critical lab value of pO2 <30; maintained O2 sat on monitor of 95-100 on 2 L O2.   Patient HR sustained in the 160's at 0310 and again at 0530.  IV metoprolol administered both times.  Patient RR remained in 30's-40's through the night.  Will continue to monitor.

## 2021-05-21 DEATH — deceased

## 2021-06-02 ENCOUNTER — Ambulatory Visit: Payer: Medicare Other | Admitting: Internal Medicine
